# Patient Record
Sex: Male | Born: 1945
Health system: Southern US, Community
[De-identification: ages and names within clinical notes are randomized; demographics above are authoritative.]

## PROBLEM LIST (undated history)

## (undated) DIAGNOSIS — C9000 Multiple myeloma not having achieved remission: Secondary | ICD-10-CM

## (undated) DIAGNOSIS — N183 Chronic kidney disease, stage 3 unspecified: Secondary | ICD-10-CM

## (undated) DIAGNOSIS — N429 Disorder of prostate, unspecified: Secondary | ICD-10-CM

## (undated) DIAGNOSIS — D649 Anemia, unspecified: Secondary | ICD-10-CM

## (undated) DIAGNOSIS — M1990 Unspecified osteoarthritis, unspecified site: Secondary | ICD-10-CM

## (undated) DIAGNOSIS — I10 Essential (primary) hypertension: Secondary | ICD-10-CM

## (undated) HISTORY — DX: Anemia, unspecified: D64.9

## (undated) HISTORY — PX: KNEE SURGERY: SHX244

## (undated) HISTORY — DX: Unspecified osteoarthritis, unspecified site: M19.90

---

## 2000-07-13 ENCOUNTER — Encounter: Admission: RE | Admit: 2000-07-13 | Discharge: 2000-07-13 | Payer: Self-pay | Admitting: Emergency Medicine

## 2000-07-13 ENCOUNTER — Encounter: Payer: Self-pay | Admitting: Emergency Medicine

## 2002-06-03 ENCOUNTER — Encounter: Payer: Self-pay | Admitting: Emergency Medicine

## 2002-06-03 ENCOUNTER — Encounter: Admission: RE | Admit: 2002-06-03 | Discharge: 2002-06-03 | Payer: Self-pay | Admitting: Emergency Medicine

## 2008-05-31 ENCOUNTER — Emergency Department (HOSPITAL_COMMUNITY): Admission: EM | Admit: 2008-05-31 | Discharge: 2008-05-31 | Payer: Self-pay | Admitting: Emergency Medicine

## 2012-02-12 ENCOUNTER — Encounter (HOSPITAL_COMMUNITY): Payer: Self-pay | Admitting: Emergency Medicine

## 2012-02-12 ENCOUNTER — Emergency Department (HOSPITAL_COMMUNITY)
Admission: EM | Admit: 2012-02-12 | Discharge: 2012-02-12 | Disposition: A | Discharge status: Home Health | Payer: 59 | Source: Home / Self Care | Attending: Emergency Medicine | Admitting: Emergency Medicine

## 2012-02-12 DIAGNOSIS — J40 Bronchitis, not specified as acute or chronic: Secondary | ICD-10-CM

## 2012-02-12 DIAGNOSIS — J069 Acute upper respiratory infection, unspecified: Secondary | ICD-10-CM

## 2012-02-12 DIAGNOSIS — J329 Chronic sinusitis, unspecified: Secondary | ICD-10-CM

## 2012-02-12 HISTORY — DX: Essential (primary) hypertension: I10

## 2012-02-12 MED ORDER — BENZONATATE 200 MG PO CAPS: 200.0000 mg | ORAL_CAPSULE | Freq: Three times a day (TID) | ORAL | Status: AC | PRN

## 2012-02-12 MED ORDER — AMOXICILLIN 500 MG PO CAPS: 1000.0000 mg | ORAL_CAPSULE | Freq: Three times a day (TID) | ORAL | Status: AC

## 2012-02-12 MED ORDER — ALBUTEROL SULFATE HFA 108 (90 BASE) MCG/ACT IN AERS: 1.0000 | INHALATION_SPRAY | Freq: Four times a day (QID) | RESPIRATORY_TRACT | Status: DC | PRN

## 2012-02-12 NOTE — Discharge Instructions (Signed)

## 2012-02-12 NOTE — ED Notes (Signed)
Pt having cough for about 2 weeks. States it got better with some nasonex usage, but now is getting worse and is painful. Yellow thin mucus coughing up.

## 2012-02-12 NOTE — ED Provider Notes (Signed)
Chief Complaint  Patient presents with  . Cough    History of Present Illness:   Marcus Hatfield is a 66 year old male who has had a one-week history of nasal congestion particularly in the right side with yellow drainage and headache. He's had an a cough productive of yellow sputum and some aching in his ribs. He occasionally wheezes. He's had a low-grade fever and a sore throat as well.  Review of Systems:  Other than noted above, the patient denies any of the following symptoms. Systemic:  No fever, chills, sweats, fatigue, myalgias, headache, or anorexia. Eye:  No redness, pain or drainage. ENT:  No earache, nasal congestion, rhinorrhea, sinus pressure, or sore throat. Lungs:  No cough, sputum production, wheezing, shortness of breath. Or chest pain. GI:  No nausea, vomiting, abdominal pain or diarrhea. Skin:  No rash or itching.  PMFSH:  Past medical history, family history, social history, meds, and allergies were reviewed.  Physical Exam:   Vital signs:  BP 130/84  Pulse 70  Temp(Src) 99.4 F (37.4 C) (Oral)  Resp 16  SpO2 95% General:  Alert, in no distress. Eye:  No conjunctival injection or drainage. ENT:  TMs and canals were normal, without erythema or inflammation.  Nasal mucosa was congested bilaterally, without drainage.  Mucous membranes were moist.  Pharynx was erythematous, without exudate or drainage.  There were no oral ulcerations or lesions. Neck:  Supple, no adenopathy, tenderness or mass. Lungs:  No respiratory distress.  Lungs were clear to auscultation, without wheezes, rales or rhonchi.  Breath sounds were clear and equal bilaterally. Heart:  Regular rhythm, without gallops, murmers or rubs. Skin:  Clear, warm, and dry, without rash or lesions.  Labs:  No results found for this or any previous visit.   Radiology:  No results found.  Assessment:   Diagnoses that have been ruled out:  None  Diagnoses that are still under consideration:  None  Final diagnoses:    Viral upper respiratory infection  Sinusitis  Bronchitis      Plan:   1.  The following meds were prescribed:   New Prescriptions   ALBUTEROL (PROVENTIL HFA;VENTOLIN HFA) 108 (90 BASE) MCG/ACT INHALER    Inhale 1-2 puffs into the lungs every 6 (six) hours as needed for wheezing.   AMOXICILLIN (AMOXIL) 500 MG CAPSULE    Take 2 capsules (1,000 mg total) by mouth 3 (three) times daily.   BENZONATATE (TESSALON) 200 MG CAPSULE    Take 1 capsule (200 mg total) by mouth 3 (three) times daily as needed for cough.   2.  The patient was instructed in symptomatic care and handouts were given. 3.  The patient was told to return if becoming worse in any way, if no better in 3 or 4 days, and given some red flag symptoms that would indicate earlier return.   Roque Lias, MD 02/12/12 [phone]

## 2012-02-21 NOTE — ED Notes (Signed)
Pt. brought FMLA papers for intermittant FMLA for the next year. I called pt. back and told him we are not a primary care facility and would not be able to fill this out for him. I told him we gave him a 3 day work note for a URI. I asked him if he stayed out any longer than that and he said no, but he wished he had because he was still sick. I explained that if he had an ongoing medical problem that requires intermittant FMLA he should get a PCP that can fill it out for him. Vassie Moselle 02/21/2012

## 2012-04-01 ENCOUNTER — Emergency Department (HOSPITAL_COMMUNITY)
Admission: EM | Admit: 2012-04-01 | Discharge: 2012-04-01 | Disposition: A | Discharge status: Home / Self Care | Payer: 59 | Source: Home / Self Care | Attending: Emergency Medicine | Admitting: Emergency Medicine

## 2012-04-01 ENCOUNTER — Encounter (HOSPITAL_COMMUNITY): Payer: Self-pay

## 2012-04-01 DIAGNOSIS — R05 Cough: Secondary | ICD-10-CM

## 2012-04-01 DIAGNOSIS — R059 Cough, unspecified: Secondary | ICD-10-CM

## 2012-04-01 MED ORDER — GUAIFENESIN-CODEINE 100-10 MG/5ML PO SYRP: 5.0000 mL | ORAL_SOLUTION | Freq: Four times a day (QID) | ORAL | Status: AC | PRN

## 2012-04-01 NOTE — Discharge Instructions (Signed)
Take two puffs from your albuterol inhaler every 4 hours. Take the medication as written. Use a neti pot or the NeilMed sinus rinse as often as you want to to reduce nasal congestion. Follow the directions on the box. Continue the nasal steroid spray and the claritin-d.   . Make sure you drink extra fluids. Return if you get worse, have a fever >100.4, or any other concerns.   Go to www.goodrx.com to look up your medications. This will give you a list of where you can find your prescriptions at the most affordable prices.

## 2012-04-01 NOTE — ED Notes (Signed)
3-4 day hx of sinus pressure.  Took claritin last night and was able to cough and blow nose to obtain mucus.  Feels better after taking claritin. Noticed low grade fevers.

## 2012-04-02 NOTE — ED Provider Notes (Signed)
History     CSN: [id number]  Arrival date & time 04/01/12  1818   First MD Initiated Contact with Patient 04/01/12 1928      Chief Complaint  Patient presents with  . Sinus Problem    3-4 day hx of sinus pressure.  Took claritin last night and was able to cough and blow nose to obtain mucus.  Feels better after taking claritin. Noticed low grade fevers.     (Consider location/radiation/quality/duration/timing/severity/associated sxs/prior treatment) HPI Comments: Pt with nasal congestion, sinus pressure, nonproductive cough for 3-4 days. Reports some postnasal drip. Patient started taking Claritin-D last night, with some improvement in his symptoms. No aggravating factors. Patient reports some difficulty sleeping at night secondary to all the coughing.  No visual changes. No fevers, headache, rhinorrhea,  ST, bodyaches. No dental pain, purulent nasal discharge. No eye itching, redness. No ear pain, wheeze, SOB, abd pain, rash, N/V. No change in appetite.   ROS as noted in HPI. All other ROS negative.     Patient is a 66 y.o. male presenting with sinus complaint. The history is provided by the patient. No language interpreter was used.  Sinus Problem This is a new problem. The current episode started more than 2 days ago. The problem occurs constantly.    Past Medical History  Diagnosis Date  . Hypertension     Past Surgical History  Procedure Date  . Knee surgery     History reviewed. No pertinent family history.  History  Substance Use Topics  . Smoking status: Never Smoker   . Smokeless tobacco: Not on file  . Alcohol Use: No      Review of Systems  Allergies  Review of patient's allergies indicates no known allergies.  Home Medications   Current Outpatient Rx  Name Route Sig Dispense Refill  . ALBUTEROL SULFATE HFA 108 (90 BASE) MCG/ACT IN AERS Inhalation Inhale 1-2 puffs into the lungs every 6 (six) hours as needed for wheezing. 1 Inhaler 0  .  LORATADINE-PSEUDOEPHEDRINE ER 10-240 MG PO TB24 Oral Take 1 tablet by mouth daily.    . MOMETASONE FUROATE 50 MCG/ACT NA SUSP Nasal Place 2 sprays into the nose daily.    . MULTI-VITAMIN/MINERALS PO TABS Oral Take 1 tablet by mouth daily.    Marland Kitchen OLMESARTAN-AMLODIPINE-HCTZ 20-5-12.5 MG PO TABS Oral Take by mouth.    . GUAIFENESIN-CODEINE 100-10 MG/5ML PO SYRP Oral Take 5 mLs by mouth 4 (four) times daily as needed for cough or congestion. 120 mL 0    BP 139/83  Pulse 82  Temp(Src) 98.7 F (37.1 C) (Oral)  Resp 16  SpO2 98%  Physical Exam  Nursing note and vitals reviewed. Constitutional: He is oriented to person, place, and time. He appears well-developed and well-nourished.  HENT:  Head: Normocephalic and atraumatic.  Right Ear: Hearing, tympanic membrane and ear canal normal.  Left Ear: Hearing, tympanic membrane and ear canal normal.  Nose: Mucosal edema and rhinorrhea present.  Mouth/Throat: Uvula is midline and mucous membranes are normal. Posterior oropharyngeal erythema present. No oropharyngeal exudate.       No sinus tenderness  Eyes: Conjunctivae and EOM are normal. Pupils are equal, round, and reactive to light.  Neck: Normal range of motion. Neck supple.  Cardiovascular: Normal rate, regular rhythm and normal heart sounds.   Pulmonary/Chest: Effort normal and breath sounds normal. He exhibits no tenderness.  Abdominal: Soft. Bowel sounds are normal. He exhibits no distension. There is no tenderness.  Musculoskeletal: Normal range  of motion. He exhibits no edema.  Lymphadenopathy:    He has no cervical adenopathy.  Neurological: He is alert and oriented to person, place, and time.  Skin: Skin is warm and dry. No rash noted.  Psychiatric: He has a normal mood and affect. His behavior is normal. Judgment and thought content normal.    ED Course  Procedures (including critical care time)  Labs Reviewed - No data to display No results found.   1. Cough       MDM    No evidence of bacterial sinusitis at this time. Symptoms responded well to Claritin D. think that this may have a significant allergy component. Will have him continue the Claritin-D, restart the Nasonex. Advised saline nasal irrigation. Cough most likely with allergy component. Will have him restart the albuterol that he was prescribed last time. Will also give him some cough syrup. Will follow with Dr. as needed.  Luiz Blare, MD 04/02/12 2220

## 2012-04-13 ENCOUNTER — Encounter (INDEPENDENT_AMBULATORY_CARE_PROVIDER_SITE_OTHER): Payer: Self-pay | Admitting: General Surgery

## 2012-04-16 ENCOUNTER — Encounter (INDEPENDENT_AMBULATORY_CARE_PROVIDER_SITE_OTHER): Payer: Self-pay | Admitting: General Surgery

## 2012-04-16 ENCOUNTER — Ambulatory Visit (INDEPENDENT_AMBULATORY_CARE_PROVIDER_SITE_OTHER): Payer: 59 | Admitting: General Surgery

## 2012-04-16 VITALS — BP 144/86 | Temp 96.8°F | Ht 71.0 in | Wt 198.6 lb

## 2012-04-16 DIAGNOSIS — K645 Perianal venous thrombosis: Secondary | ICD-10-CM | POA: Insufficient documentation

## 2012-04-16 NOTE — Patient Instructions (Signed)
Try using Miralax everyday for constipation Do not use donut May use desitin or aquaphor to protect skin daily

## 2012-04-16 NOTE — Progress Notes (Signed)
Subjective:     Patient ID: Marcus Hatfield, male   DOB: Apr 11, 1946, 66 y.o.   MRN: [id number]  HPI We are asked to see the patient in consultation by Dr. Felipa Eth to evaluate him for a hemorrhoid. The patient is a 66 year old black male who began having pain in his perirectal area last Wednesday. He did notice a swollen area. Most of the soreness is getting better but he continues to have some burning and itching in the area. He denies any bleeding. He does note that he has lots of problems with constipation  Review of Systems  Constitutional: Negative.   HENT: Negative.   Eyes: Negative.   Respiratory: Negative.   Cardiovascular: Negative.   Gastrointestinal: Positive for constipation.  Genitourinary: Negative.   Musculoskeletal: Negative.   Skin: Negative.   Neurological: Negative.   Hematological: Negative.   Psychiatric/Behavioral: Negative.        Objective:   Physical Exam  Constitutional: He is oriented to person, place, and time. He appears well-developed and well-nourished.  HENT:  Head: Normocephalic and atraumatic.  Eyes: Conjunctivae and EOM are normal. Pupils are equal, round, and reactive to light.  Neck: Normal range of motion. Neck supple.  Cardiovascular: Normal rate, regular rhythm and normal heart sounds.   Pulmonary/Chest: Effort normal and breath sounds normal.  Abdominal: Soft. Bowel sounds are normal.  Genitourinary:       The patient has a small soft and external hemorrhoidal skin tag that is fairly broad-based in the right lateral position. This area may have been thrombosed and is gradually resolving based on its appearance. On digital exam he has good rectal tone and no palpable mass.  Musculoskeletal: Normal range of motion.  Neurological: He is alert and oriented to person, place, and time.  Skin: Skin is warm and dry.  Psychiatric: He has a normal mood and affect. His behavior is normal.       Assessment:     I believe the patient probably had a  thrombosed external hemorrhoid that is resolving now. I don't think at this point there is anything surgical to do for it. I have recommended some barrier cream to keep the skin healthy. I have also recommended trying MiraLax to help fix his constipation and hopefully this will keep him from continuing to have hemorrhoid problems. Otherwise he can followup with his medical doctor.    Plan:     No surgical intervention is needed at this point.

## 2012-04-18 ENCOUNTER — Telehealth (INDEPENDENT_AMBULATORY_CARE_PROVIDER_SITE_OTHER): Payer: Self-pay

## 2012-04-18 ENCOUNTER — Telehealth (INDEPENDENT_AMBULATORY_CARE_PROVIDER_SITE_OTHER): Payer: Self-pay | Admitting: General Surgery

## 2012-04-18 NOTE — Telephone Encounter (Signed)
Patient would like to know if he should be using OTC or prescription strength suppositories for relief from itching and burning he continues to experience. Patient seen on 04/16/12 for internal & external hemorrhoids.

## 2012-04-18 NOTE — Telephone Encounter (Signed)
Called pt back and left a message saying that he did not need to take any OTC drugs for that problem.

## 2012-04-19 NOTE — Telephone Encounter (Signed)
I think he should just use desitin or aquaphor

## 2012-05-10 ENCOUNTER — Encounter (HOSPITAL_COMMUNITY): Payer: Self-pay | Admitting: Emergency Medicine

## 2012-05-10 ENCOUNTER — Emergency Department (HOSPITAL_COMMUNITY)
Admission: EM | Admit: 2012-05-10 | Discharge: 2012-05-10 | Disposition: A | Discharge status: Home / Self Care | Payer: 59 | Source: Home / Self Care | Attending: Emergency Medicine | Admitting: Emergency Medicine

## 2012-05-10 DIAGNOSIS — T148 Other injury of unspecified body region: Secondary | ICD-10-CM

## 2012-05-10 DIAGNOSIS — W57XXXA Bitten or stung by nonvenomous insect and other nonvenomous arthropods, initial encounter: Secondary | ICD-10-CM

## 2012-05-10 DIAGNOSIS — T148XXA Other injury of unspecified body region, initial encounter: Secondary | ICD-10-CM

## 2012-05-10 MED ORDER — BACITRACIN ZINC 500 UNIT/GM EX OINT: TOPICAL_OINTMENT | Freq: Two times a day (BID) | CUTANEOUS | Status: AC

## 2012-05-10 NOTE — ED Provider Notes (Signed)
History     CSN: [id number]  Arrival date & time 05/10/12  1905   First MD Initiated Contact with Patient 05/10/12 1908      Chief Complaint  Patient presents with  . Tick Removal    (Consider location/radiation/quality/duration/timing/severity/associated sxs/prior treatment) HPI Comments: Last night his wife found a tick embedded in his right flank. They've wear applying alcohol tried to remove it but they were unsuccessful. Patient describes it as being itchy but he didn't know it was a tick thought it was just a whelp.  The history is provided by the patient.    Past Medical History  Diagnosis Date  . Hypertension   . Arthritis     Past Surgical History  Procedure Date  . Knee surgery     Family History  Problem Relation Age of Onset  . Diabetes Father     History  Substance Use Topics  . Smoking status: Never Smoker   . Smokeless tobacco: Not on file  . Alcohol Use: No      Review of Systems  Eyes: Negative for itching.  Skin: Positive for rash.    Allergies  Penicillins  Home Medications   Current Outpatient Rx  Name Route Sig Dispense Refill  . ALBUTEROL SULFATE HFA 108 (90 BASE) MCG/ACT IN AERS Inhalation Inhale 1-2 puffs into the lungs every 6 (six) hours as needed for wheezing. 1 Inhaler 0  . BACITRACIN ZINC 500 UNIT/GM EX OINT Topical Apply topically 2 (two) times daily. Apply bid x 5 days 120 g 0  . HYDROCORTISONE ACETATE 25 MG RE SUPP      . LORATADINE-PSEUDOEPHEDRINE ER 10-240 MG PO TB24 Oral Take 1 tablet by mouth daily.    . MOMETASONE FUROATE 50 MCG/ACT NA SUSP Nasal Place 2 sprays into the nose daily.    . MULTI-VITAMIN/MINERALS PO TABS Oral Take 1 tablet by mouth daily.    Marland Kitchen OLMESARTAN-AMLODIPINE-HCTZ 20-5-12.5 MG PO TABS Oral Take by mouth.    Marland Kitchen PROCTOSOL HC 2.5 % RE CREA        BP 131/90  Pulse 78  Temp(Src) 98.5 F (36.9 C) (Oral)  Resp 18  SpO2 99%  Physical Exam  Constitutional: He appears well-developed and  well-nourished.  Abdominal: He exhibits no distension.  Skin: Rash noted.       ED Course  Procedures (including critical care time)  Labs Reviewed - No data to display No results found.   1. Tick bite       MDM  Tick removed from right flank. Tick was not engorged with any blood. No residual mouth apparatus were left in site. Patient denies any systemic symptoms such as fevers, body aches nausea vomiting abdominal pain or any new rashes. No headaches the patient symptoms and weren't further evaluation and perhaps treatment and discuss is low risk of having require a tick borne illness. Patient was a treatment plan and understands symptoms or further evaluation.        Jimmie Molly, MD 05/10/12 [phone]

## 2012-05-10 NOTE — Discharge Instructions (Signed)
We discuss several symptoms that would warrant followup and recheck. Have remove this tick completely blood in it suspect her risk of acquiring a tick related illness this very low. we discussed what symptoms will be concerned that you will need to seek medical attention immediately. read discharge instructions for further information

## 2012-05-10 NOTE — ED Notes (Signed)
Pt has a tick imbedded in his right flank that his wife found and he needs it removed. Tick was first noted 2 days ago. No pain reported.

## 2012-05-24 ENCOUNTER — Telehealth (HOSPITAL_COMMUNITY): Payer: Self-pay | Admitting: *Deleted

## 2012-05-24 NOTE — ED Notes (Signed)
Pt. called and said he has 3 hard bumps that have come up around the site where the tick was removed. Denies redness or pain. It does itch off and on. No fever or headaches. Discussed with Dr. Ladon Applebaum, check for other bumps at the same level around his back, if no other bumps then try Hydrocortisone 2-3 times per day for 2 days.  If no improvement, come in for a recheck. Pt. given these instructions and voiced understanding. Vassie Moselle 05/24/2012

## 2013-06-30 ENCOUNTER — Encounter (HOSPITAL_COMMUNITY): Payer: Self-pay | Admitting: *Deleted

## 2013-06-30 ENCOUNTER — Emergency Department (HOSPITAL_COMMUNITY)
Admission: EM | Admit: 2013-06-30 | Discharge: 2013-06-30 | Disposition: A | Discharge status: Home / Self Care | Payer: Medicare Other | Source: Home / Self Care | Attending: Emergency Medicine | Admitting: Emergency Medicine

## 2013-06-30 DIAGNOSIS — R319 Hematuria, unspecified: Secondary | ICD-10-CM

## 2013-06-30 HISTORY — DX: Disorder of prostate, unspecified: N42.9

## 2013-06-30 LAB — POCT URINALYSIS DIP (DEVICE)
Bilirubin Urine: NEGATIVE
Glucose, UA: NEGATIVE mg/dL
Ketones, ur: NEGATIVE mg/dL
Leukocytes, UA: NEGATIVE
Nitrite: NEGATIVE
Protein, ur: 100 mg/dL — AB
Specific Gravity, Urine: 1.015 (ref 1.005–1.030)
Urobilinogen, UA: 0.2 mg/dL (ref 0.0–1.0)
pH: 7 (ref 5.0–8.0)

## 2013-06-30 NOTE — ED Provider Notes (Signed)
And a  History    CSN: [id number] Arrival date & time 06/30/13  0905  First MD Initiated Contact with Patient 06/30/13 0915     Chief Complaint  Patient presents with  . Hematuria   (Consider location/radiation/quality/duration/timing/severity/associated sxs/prior Treatment) HPI Comments: Patient presents urgent care describing that since last night as in the middle of the night he noticed significant amount of blood in his urine. First a 3 AM and early this morning. He has not had any pain associated with it such as flank or abdominal pain or suprapubic pressure or pain.  Patient also denies any nausea vomiting or fevers. Patient describes that he has been seen by the urologist couple years ago where he had prostate biopsies that have been there to be negative for cancer. He is currently under the impression that he has an enlarged prostate only.   Patient also relates that for about a month or 2 he has been having some upper back discomfort that he notices only when he bends over or flexes his back as he tries to perform stretching exercises every day.   Patient denies any constitutional symptoms such as fevers, generalized malaise or unintentional weight loss. Denies any rectal pain or bleeding.   He also describes it as he gave [place] a urine sample this morning blood seems to have subsided some as it looks a bit clearer than what it did on 2 previous occasions he urinated in the last 8 hours.  Patient is a 67 y.o. male presenting with hematuria. The history is provided by the patient.  Hematuria This is a new problem. The current episode started yesterday. The problem occurs constantly. The problem has been gradually worsening. Pertinent negatives include no chest pain, no abdominal pain, no headaches and no shortness of breath. Nothing aggravates the symptoms. Nothing relieves the symptoms. He has tried nothing for the symptoms. The treatment provided no relief.   Past Medical History   Diagnosis Date  . Hypertension   . Arthritis   . Prostate disorder    Past Surgical History  Procedure Laterality Date  . Knee surgery     Family History  Problem Relation Age of Onset  . Diabetes Father    History  Substance Use Topics  . Smoking status: Never Smoker   . Smokeless tobacco: Not on file  . Alcohol Use: No    Review of Systems  Constitutional: Negative for fever, chills, diaphoresis, activity change, appetite change, fatigue and unexpected weight change.  Respiratory: Negative for shortness of breath.   Cardiovascular: Negative for chest pain.  Gastrointestinal: Negative for abdominal pain.  Genitourinary: Positive for hematuria. Negative for dysuria, urgency, frequency, flank pain, decreased urine volume, penile swelling, scrotal swelling, enuresis, genital sores and testicular pain.  Skin: Negative for color change, pallor and rash.  Neurological: Negative for headaches.    Allergies  Penicillins  Home Medications   Current Outpatient Rx  Name  Route  Sig  Dispense  Refill  . EXPIRED: albuterol (PROVENTIL HFA;VENTOLIN HFA) 108 (90 BASE) MCG/ACT inhaler   Inhalation   Inhale 1-2 puffs into the lungs every 6 (six) hours as needed for wheezing.   1 Inhaler   0   . hydrocortisone (ANUSOL-HC) 25 MG suppository               . loratadine-pseudoephedrine (CLARITIN-D 24-HOUR) 10-240 MG per 24 hr tablet   Oral   Take 1 tablet by mouth daily.         Marland Kitchen  mometasone (NASONEX) 50 MCG/ACT nasal spray   Nasal   Place 2 sprays into the nose daily.         . Multiple Vitamins-Minerals (MULTIVITAMIN WITH MINERALS) tablet   Oral   Take 1 tablet by mouth daily.         . Olmesartan-Amlodipine-HCTZ (TRIBENZOR) 20-5-12.5 MG TABS   Oral   Take by mouth.         Marland Kitchen PROCTOSOL HC 2.5 % rectal cream                BP 138/84  Pulse 57  Temp(Src) 98.2 F (36.8 C) (Oral)  Resp 17  SpO2 98% Physical Exam  Vitals reviewed. Constitutional: He  appears well-developed and well-nourished.  Abdominal: Soft. Normal appearance. He exhibits no distension and no mass. There is no hepatosplenomegaly, splenomegaly or hepatomegaly. There is no tenderness. There is no rigidity, no rebound, no guarding, no CVA tenderness, no tenderness at McBurney's point and negative Murphy's sign. No hernia. Hernia confirmed negative in the ventral area, confirmed negative in the right inguinal area and confirmed negative in the left inguinal area.  Genitourinary: Testes normal and penis normal.  Neurological: He is alert.  Skin: No erythema.    ED Course  Procedures (including critical care time) Labs Reviewed  POCT URINALYSIS DIP (DEVICE) - Abnormal; Notable for the following:    Hgb urine dipstick LARGE (*)    Protein, ur 100 (*)    All other components within normal limits  URINE CULTURE   No results found. 1. Hematuria     MDM  Hematuria (Painless)  < 24 hrs-   Have discussed with patient the possibility, the disc hematuria might be related to either kidney or bladder problems including  Oncological- disorder, such as bladder cancer or renal carcinoma. Differential diagnosis also includes, ureterolithiasis.  Have advised patient to followup with his primary care Dr. next week to repeat a urinalysis. Have ordered a renal/bladder ultrasound, to be done ambulatory within the next week or so. Patient has been instructed that he will be called by the hospital to schedule it. Have also instructed patient to discuss this with his primary care Dr. Have discussed with patient what symptoms should warrant immediate medical attention in the emergency department such as abdominal pain, nausea vomiting or fevers. He has also been instructed that he will be contacted if abnormal urine culture results  Jimmie Molly, MD 06/30/13 1022

## 2013-06-30 NOTE — ED Notes (Addendum)
Pt  Says  He  Has  A  Large  Prostate     He  Says  He  Noticed  Blood  In urine  Last  Night   He  Reports  His  Stream  Seems  To  Cut off   During  Urination  He is  Sitting  Comfortably on  Exam table  In no  Distress  Denys  Any pain Pt reports  Pain   Was  5  During  The  Night  Earlier    He  Reports    Kidney  Stone in the  Distant  Past when he  Was  younger

## 2013-07-02 LAB — URINE CULTURE
Colony Count: 30000
Special Requests: NORMAL

## 2013-07-09 ENCOUNTER — Ambulatory Visit (HOSPITAL_COMMUNITY): Payer: Medicare Other

## 2013-07-11 ENCOUNTER — Ambulatory Visit (HOSPITAL_COMMUNITY)
Admission: RE | Admit: 2013-07-11 | Discharge: 2013-07-11 | Disposition: A | Discharge status: Home / Self Care | Payer: Medicare Other | Source: Ambulatory Visit | Attending: Emergency Medicine | Admitting: Emergency Medicine

## 2013-07-11 DIAGNOSIS — R319 Hematuria, unspecified: Secondary | ICD-10-CM | POA: Insufficient documentation

## 2013-07-11 DIAGNOSIS — Z87442 Personal history of urinary calculi: Secondary | ICD-10-CM | POA: Insufficient documentation

## 2013-07-11 DIAGNOSIS — N400 Enlarged prostate without lower urinary tract symptoms: Secondary | ICD-10-CM | POA: Insufficient documentation

## 2013-08-02 ENCOUNTER — Other Ambulatory Visit: Payer: Self-pay | Admitting: Internal Medicine

## 2013-08-02 DIAGNOSIS — N2889 Other specified disorders of kidney and ureter: Secondary | ICD-10-CM

## 2013-08-06 ENCOUNTER — Ambulatory Visit
Admission: RE | Admit: 2013-08-06 | Discharge: 2013-08-06 | Disposition: A | Discharge status: Home / Self Care | Payer: Medicare Other | Source: Ambulatory Visit | Attending: Internal Medicine | Admitting: Internal Medicine

## 2013-08-06 DIAGNOSIS — N2889 Other specified disorders of kidney and ureter: Secondary | ICD-10-CM

## 2013-08-06 MED ORDER — GADOBENATE DIMEGLUMINE 529 MG/ML IV SOLN
19.0000 mL | Freq: Once | INTRAVENOUS | Status: AC | PRN
  Administered 2013-08-06: 19 mL via INTRAVENOUS

## 2014-12-19 DIAGNOSIS — C9000 Multiple myeloma not having achieved remission: Secondary | ICD-10-CM

## 2014-12-19 HISTORY — DX: Multiple myeloma not having achieved remission: C90.00

## 2015-03-20 HISTORY — PX: NEPHRECTOMY: SHX65

## 2015-08-12 ENCOUNTER — Other Ambulatory Visit: Payer: Self-pay | Admitting: Internal Medicine

## 2015-08-12 DIAGNOSIS — N2889 Other specified disorders of kidney and ureter: Secondary | ICD-10-CM

## 2015-08-14 ENCOUNTER — Ambulatory Visit
Admission: RE | Admit: 2015-08-14 | Discharge: 2015-08-14 | Disposition: A | Discharge status: Home / Self Care | Payer: Medicare HMO | Source: Ambulatory Visit | Attending: Internal Medicine | Admitting: Internal Medicine

## 2015-08-14 DIAGNOSIS — N2889 Other specified disorders of kidney and ureter: Secondary | ICD-10-CM

## 2016-02-05 ENCOUNTER — Telehealth: Payer: Self-pay | Admitting: Internal Medicine

## 2016-02-05 NOTE — Telephone Encounter (Signed)
pts daughter came in to see if you can take on him and his wife Marcus Hatfield MRN [id number] as new patients. Your patients Denyse Amass and Carlyn Reichert had recommended you.  Please advise

## 2016-02-08 NOTE — Telephone Encounter (Signed)
Unfortunately, I'm not able to accept any more new patients at this time.  I'm sorry! Thank you!  

## 2016-02-09 NOTE — Telephone Encounter (Signed)
Left msg on Gloria's vm

## 2016-02-10 ENCOUNTER — Emergency Department (HOSPITAL_COMMUNITY): Payer: Commercial Managed Care - HMO

## 2016-02-10 ENCOUNTER — Emergency Department (HOSPITAL_COMMUNITY)
Admission: EM | Admit: 2016-02-10 | Discharge: 2016-02-11 | Disposition: A | Discharge status: Home / Self Care | Payer: Commercial Managed Care - HMO | Attending: Emergency Medicine | Admitting: Emergency Medicine

## 2016-02-10 ENCOUNTER — Encounter (HOSPITAL_COMMUNITY): Payer: Self-pay

## 2016-02-10 DIAGNOSIS — D649 Anemia, unspecified: Secondary | ICD-10-CM | POA: Diagnosis not present

## 2016-02-10 DIAGNOSIS — M1990 Unspecified osteoarthritis, unspecified site: Secondary | ICD-10-CM | POA: Insufficient documentation

## 2016-02-10 DIAGNOSIS — Z79899 Other long term (current) drug therapy: Secondary | ICD-10-CM | POA: Diagnosis not present

## 2016-02-10 DIAGNOSIS — Z87438 Personal history of other diseases of male genital organs: Secondary | ICD-10-CM | POA: Insufficient documentation

## 2016-02-10 DIAGNOSIS — Z880 Allergy status to penicillin: Secondary | ICD-10-CM | POA: Insufficient documentation

## 2016-02-10 DIAGNOSIS — Z7952 Long term (current) use of systemic steroids: Secondary | ICD-10-CM | POA: Diagnosis not present

## 2016-02-10 DIAGNOSIS — N9961 Intraoperative hemorrhage and hematoma of a genitourinary system organ or structure complicating a genitourinary system procedure: Secondary | ICD-10-CM | POA: Diagnosis not present

## 2016-02-10 DIAGNOSIS — IMO0001 Reserved for inherently not codable concepts without codable children: Secondary | ICD-10-CM

## 2016-02-10 DIAGNOSIS — Z905 Acquired absence of kidney: Secondary | ICD-10-CM | POA: Insufficient documentation

## 2016-02-10 DIAGNOSIS — I10 Essential (primary) hypertension: Secondary | ICD-10-CM | POA: Diagnosis not present

## 2016-02-10 DIAGNOSIS — R109 Unspecified abdominal pain: Secondary | ICD-10-CM

## 2016-02-10 DIAGNOSIS — S3692XA Contusion of unspecified intra-abdominal organ, initial encounter: Secondary | ICD-10-CM

## 2016-02-10 DIAGNOSIS — R05 Cough: Secondary | ICD-10-CM | POA: Diagnosis not present

## 2016-02-10 DIAGNOSIS — R0602 Shortness of breath: Secondary | ICD-10-CM | POA: Diagnosis present

## 2016-02-10 LAB — ABO/RH: ABO/RH(D): B POS

## 2016-02-10 LAB — CBC WITH DIFFERENTIAL/PLATELET
Basophils Absolute: 0 10*3/uL (ref 0.0–0.1)
Basophils Relative: 0 %
Eosinophils Absolute: 0.5 10*3/uL (ref 0.0–0.7)
Eosinophils Relative: 4 %
HCT: 17.3 % — ABNORMAL LOW (ref 39.0–52.0)
Hemoglobin: 5.7 g/dL — CL (ref 13.0–17.0)
Lymphocytes Relative: 15 %
Lymphs Abs: 1.9 10*3/uL (ref 0.7–4.0)
MCH: 28.6 pg (ref 26.0–34.0)
MCHC: 32.9 g/dL (ref 30.0–36.0)
MCV: 86.9 fL (ref 78.0–100.0)
Monocytes Absolute: 1.5 10*3/uL — ABNORMAL HIGH (ref 0.1–1.0)
Monocytes Relative: 12 %
Neutro Abs: 8.8 10*3/uL — ABNORMAL HIGH (ref 1.7–7.7)
Neutrophils Relative %: 69 %
Platelets: 457 10*3/uL — ABNORMAL HIGH (ref 150–400)
RBC: 1.99 MIL/uL — ABNORMAL LOW (ref 4.22–5.81)
RDW: 14.8 % (ref 11.5–15.5)
WBC: 12.7 10*3/uL — ABNORMAL HIGH (ref 4.0–10.5)

## 2016-02-10 LAB — COMPREHENSIVE METABOLIC PANEL
ALT: 55 U/L (ref 17–63)
AST: 35 U/L (ref 15–41)
Albumin: 2.8 g/dL — ABNORMAL LOW (ref 3.5–5.0)
Alkaline Phosphatase: 92 U/L (ref 38–126)
Anion gap: 10 (ref 5–15)
BUN: 30 mg/dL — ABNORMAL HIGH (ref 6–20)
CO2: 23 mmol/L (ref 22–32)
Calcium: 8.4 mg/dL — ABNORMAL LOW (ref 8.9–10.3)
Chloride: 101 mmol/L (ref 101–111)
Creatinine, Ser: 2.25 mg/dL — ABNORMAL HIGH (ref 0.61–1.24)
GFR calc Af Amer: 32 mL/min — ABNORMAL LOW (ref >60)
GFR calc non Af Amer: 28 mL/min — ABNORMAL LOW (ref >60)
Glucose, Bld: 111 mg/dL — ABNORMAL HIGH (ref 65–99)
Potassium: 3.2 mmol/L — ABNORMAL LOW (ref 3.5–5.1)
Sodium: 134 mmol/L — ABNORMAL LOW (ref 135–145)
Total Bilirubin: 1.3 mg/dL — ABNORMAL HIGH (ref 0.3–1.2)
Total Protein: 7.4 g/dL (ref 6.5–8.1)

## 2016-02-10 LAB — PREPARE RBC (CROSSMATCH)

## 2016-02-10 LAB — BRAIN NATRIURETIC PEPTIDE: B Natriuretic Peptide: 31.1 pg/mL (ref 0.0–100.0)

## 2016-02-10 LAB — I-STAT TROPONIN, ED: Troponin i, poc: 0 ng/mL (ref 0.00–0.08)

## 2016-02-10 MED ORDER — SODIUM CHLORIDE 0.9 % IV BOLUS (SEPSIS)
1000.0000 mL | Freq: Once | INTRAVENOUS | Status: AC
  Administered 2016-02-10: 1000 mL via INTRAVENOUS

## 2016-02-10 MED ORDER — SODIUM CHLORIDE 0.9 % IV SOLN
10.0000 mL/h | Freq: Once | INTRAVENOUS | Status: AC
  Administered 2016-02-11: 10 mL/h via INTRAVENOUS

## 2016-02-10 NOTE — ED Notes (Signed)
MD at bedside. 

## 2016-02-10 NOTE — ED Provider Notes (Signed)
CSN: [id number]     Arrival date & time 02/10/16  1746 History   First MD Initiated Contact with Patient 02/10/16 1916     Chief Complaint  Patient presents with  . Shortness of Breath     (Consider location/radiation/quality/duration/timing/severity/associated sxs/prior Treatment) HPI Comments: Patient is a 70 year old male with past medical history of hypertension, arthritis, and recent nephrectomy for a right renal mass. This was performed at [facility] approximately 7 days ago. He reports developing a cough in the hospital which has worsened since returning home. He is having discomfort in his right chest and also reports swelling in both legs. He denies any calf pain. He denies any fevers or chills.  Patient is a 70 y.o. male presenting with shortness of breath. The history is provided by the patient.  Shortness of Breath Severity:  Moderate Onset quality:  Gradual Duration:  5 days Timing:  Constant Progression:  Worsening Chronicity:  New Relieved by:  Nothing Worsened by:  Nothing tried Ineffective treatments:  None tried Associated symptoms: cough   Associated symptoms: no fever and no sputum production     Past Medical History  Diagnosis Date  . Hypertension   . Arthritis   . Prostate disorder    Past Surgical History  Procedure Laterality Date  . Knee surgery    . Nephrectomy     Family History  Problem Relation Age of Onset  . Diabetes Father    Social History  Substance Use Topics  . Smoking status: Never Smoker   . Smokeless tobacco: None  . Alcohol Use: No    Review of Systems  Constitutional: Negative for fever.  Respiratory: Positive for cough and shortness of breath. Negative for sputum production.   All other systems reviewed and are negative.     Allergies  Penicillins  Home Medications   Prior to Admission medications   Medication Sig Start Date End Date Taking? Authorizing Provider  amLODipine (NORVASC) 10 MG tablet Take 10 mg by  mouth daily.  12/28/15  Yes Historical Provider, MD  hydrochlorothiazide (HYDRODIURIL) 25 MG tablet Take 25 mg by mouth daily.  12/28/15  Yes Historical Provider, MD  irbesartan (AVAPRO) 300 MG tablet Take 300 mg by mouth daily.  12/28/15  Yes Historical Provider, MD  mometasone (NASONEX) 50 MCG/ACT nasal spray Place 2 sprays into the nose daily.   Yes Historical Provider, MD  Multiple Vitamins-Minerals (MULTIVITAMIN WITH MINERALS) tablet Take 1 tablet by mouth daily.   Yes Historical Provider, MD  pseudoephedrine-guaifenesin (MUCINEX D) 60-600 MG 12 hr tablet Take 1 tablet by mouth every 12 (twelve) hours as needed for congestion.    Yes Historical Provider, MD  traMADol (ULTRAM) 50 MG tablet Take 50 mg by mouth every 6 (six) hours as needed for moderate pain or severe pain.  02/04/16  Yes Historical Provider, MD  albuterol (PROVENTIL HFA;VENTOLIN HFA) 108 (90 BASE) MCG/ACT inhaler Inhale 1-2 puffs into the lungs every 6 (six) hours as needed for wheezing. 02/12/12 02/11/13  Harden Mo, MD  ibuprofen (ADVIL,MOTRIN) 200 MG tablet Take 400 mg by mouth every 6 (six) hours as needed for moderate pain.     Historical Provider, MD   BP 135/78 mmHg  Pulse 96  Temp(Src) 98.5 F (36.9 C) (Oral)  Resp 20  SpO2 100% Physical Exam  Constitutional: He is oriented to person, place, and time. He appears well-developed and well-nourished. No distress.  HENT:  Head: Normocephalic and atraumatic.  Neck: Normal range of motion. Neck supple.  Cardiovascular: Normal rate, regular rhythm and normal heart sounds.   No murmur heard. Pulmonary/Chest: Effort normal and breath sounds normal. No respiratory distress. He has no wheezes. He has no rales.  Abdominal: Soft. Bowel sounds are normal. He exhibits no distension. There is tenderness. There is no guarding.  There is mild tenderness to the right upper quadrant and right flank in the area of his previous surgery.  Musculoskeletal: Normal range of motion. He exhibits  no edema.  There is 1-2+ pitting edema of both lower extremities. There is no calf tenderness or swelling. Homans sign is absent bilaterally.  Neurological: He is alert and oriented to person, place, and time.  Skin: Skin is warm and dry. He is not diaphoretic.  Nursing note and vitals reviewed.   ED Course  Procedures (including critical care time) Labs Review Labs Reviewed  COMPREHENSIVE METABOLIC PANEL  CBC WITH DIFFERENTIAL/PLATELET  BRAIN NATRIURETIC PEPTIDE  I-STAT [place], ED    Imaging Review Dg Chest 2 View  02/10/2016  CLINICAL DATA:  Dry cough since last week. EXAM: CHEST  2 VIEW COMPARISON:  None. FINDINGS: Moderate volume of free air in the abdomen under the right and left hemidiaphragm and centrally. Heart size is normal with mild tortuosity of the aorta. There is a small right pleural effusion. Opacity abutting the right hemidiaphragm, which is elevated. Minimal subsegmental atelectasis in the left midlung. No pneumothorax. No acute osseous abnormalities are seen. IMPRESSION: 1. Moderate volume of free intra-abdominal air. Recommend correlation for any history of recent abdominal surgery. Findings otherwise concerning for perforated viscus. 2. Small right pleural effusion. Opacity in the right midlung adjacent elevated hemidiaphragm, may be atelectasis. Pneumonia could have a similar appearance. Critical Value/emergent results were called by telephone at the time of interpretation on 02/10/2016 at 6:26 pm to Dr. Letitia Libra, who verbally acknowledged these results. He has not yet seen the patient Electronically Signed   By: Jeb Levering M.D.   On: 02/10/2016 18:30   I have personally reviewed and evaluated these images and lab results as part of my medical decision-making.   EKG Interpretation   Date/Time:  Wednesday February 10 2016 17:59:47 EST Ventricular Rate:  91 PR Interval:  158 QRS Duration: 142 QT Interval:  397 QTC Calculation: 488 R Axis:   -22 Text  Interpretation:  Sinus rhythm Right bundle branch block No prior ecg  for comparison Confirmed by Gabryella Murfin  MD, Jeiden Daughtridge ([id number]) on 02/10/2016  11:33:14 PM      MDM   Final diagnoses:  None    This patient is a 70 year old male with history of renal cell carcinoma recently treated at [facility] with a robotic-assisted right nephrectomy. He presents today with complaints of shortness of breath. On exam he is awake, alert, oriented and nontoxic appearing. He does have mild tenderness to the right upper quadrant and right flank along with a hematoma in this area. His workup today reveals free air within the abdomen, a hemoglobin of 5.7, and CT scan which reveals a 7 cm hematoma in the nephrectomy bed. As he appears to be symptomatic with this hemoglobin, he will be transfused 2 units of red blood cells.   I've discussed these findings with Dr. Ria Clock from urology at [facility]. He is in agreement with the workup and see to it that the patient has follow-up in the urology clinic in the next 1-2 days. According to the CT scan results and what I learned from talking with Dr. Ria Clock, I strongly suspect that the  free air is post surgical and highly doubt any bowel perforation. There is also evidence for a right hemothorax, the etiology of which I am uncertain but likely related to the hematoma in the nephrectomy bed.  This was somewhat of a lengthy workup involving multiple phone calls and exams. The patient and family on several occasions expressed the desire to go home, however I urged him otherwise due to the possible implications of free air in the abdomen. I had multiple conversations with the patient and family. At this point he will receive a blood transfusion, then go home afterwards and follow-up in the urology clinic at [facility] in the next 1-2 days has to be arranged.    Veryl Speak, MD 02/10/16 [phone]

## 2016-02-10 NOTE — ED Notes (Signed)
MD at Bedside.

## 2016-02-10 NOTE — ED Notes (Signed)
Pt and family have expressed that they want to leave AMA. MD made aware.

## 2016-02-10 NOTE — ED Notes (Signed)
Pt transported to CT ?

## 2016-02-10 NOTE — ED Notes (Signed)
Per EMS, pt from MD office. Pt c/o shortness of breath.  Cough.  Dyspnea in supine position.  Productive.  Onset x 1 week after nephrectomy.  Per EMS, MD also concerned for possible EKG changes.  No chest pain.  Vitals:  130/88,hr 95, resp 16, 95% ra.

## 2016-02-11 NOTE — Discharge Instructions (Signed)
Follow-up with your surgeon in the next 1-2 days. They are aware of your situation and should call to arrange this appointment.  Return to the emergency department if your symptoms significantly worsen or change.   Blood Transfusion  A blood transfusion is a procedure in which you receive donated blood through an IV tube. You may need a blood transfusion because of illness, surgery, or injury. The blood may come from a donor, or it may be your own blood that you donated previously. The blood given in a transfusion is made up of different types of cells. You may receive:  Red blood cells. These carry oxygen and replace lost blood.  Platelets. These control bleeding.  Plasma. Thishelps blood to clot. If you have hemophilia or another clotting disorder, you may also receive other types of blood products. LET [facility] CARE PROVIDER KNOW ABOUT:  Any allergies you have.  All medicines you are taking, including vitamins, herbs, eye drops, creams, and over-the-counter medicines.  Previous problems you or members of your family have had with the use of anesthetics.  Any blood disorders you have.  Previous surgeries you have had.  Any medical conditions you may have.  Any previous reactions you have had during a blood transfusion.  RISKS AND COMPLICATIONS Generally, this is a safe procedure. However, problems may occur, including:  Having an allergic reaction to something in the donated blood.  Fever. This may be a reaction to the white blood cells in the transfused blood.  Iron overload. This can happen from having many transfusions.  Transfusion-related acute lung injury (TRALI). This is a rare reaction that causes lung damage. The cause is not known.TRALI can occur within hours of a transfusion or several days later.  Sudden (acute) or delayed hemolytic reactions. This happens if your blood does not match the cells in your transfusion. Your body's defense system (immune  system) may try to attack the new cells. This complication is rare.  Infection. This is rare. BEFORE THE PROCEDURE  You may have a blood test to determine your blood type. This is necessary to know what kind of blood your body will accept.  If you are going to have a planned surgery, you may donate your own blood. This may be done in case you need to have a transfusion.  If you have had an allergic reaction to a transfusion in the past, you may be given medicine to help prevent a reaction. Take this medicine only as directed by your health care provider.  You will have your temperature, blood pressure, and pulse monitored before the transfusion. PROCEDURE   An IV will be started in your hand or arm.  The bag of donated blood will be attached to your IV tube and given into your vein.  Your temperature, blood pressure, and pulse will be monitored regularly during the transfusion. This monitoring is done to detect early signs of a transfusion reaction.  If you have any signs or symptoms of a reaction, your transfusion will be stopped and you may be given medicine.  When the transfusion is over, your IV will be removed.  Pressure may be applied to the IV site for a few minutes.  A bandage (dressing) will be applied. The procedure may vary among health care providers and hospitals. AFTER THE PROCEDURE  Your blood pressure, temperature, and pulse will be monitored regularly.   This information is not intended to replace advice given to you by your health care provider. Make sure  you discuss any questions you have with your health care provider.   Document Released: 12/02/2000 Document Revised: 12/26/2014 Document Reviewed: 10/15/2014 Elsevier Interactive Patient Education [employer].

## 2016-02-11 NOTE — ED Notes (Signed)
Blood consent obtained at [place] on 02/11/2016

## 2016-02-12 LAB — TYPE AND SCREEN
ABO/RH(D): B POS
Antibody Screen: NEGATIVE
Unit division: 0
Unit division: 0

## 2016-12-20 DIAGNOSIS — C9000 Multiple myeloma not having achieved remission: Secondary | ICD-10-CM | POA: Diagnosis not present

## 2016-12-20 DIAGNOSIS — Z5111 Encounter for antineoplastic chemotherapy: Secondary | ICD-10-CM | POA: Diagnosis not present

## 2016-12-21 DIAGNOSIS — Z5111 Encounter for antineoplastic chemotherapy: Secondary | ICD-10-CM | POA: Diagnosis not present

## 2016-12-21 DIAGNOSIS — C9000 Multiple myeloma not having achieved remission: Secondary | ICD-10-CM | POA: Diagnosis not present

## 2016-12-26 DIAGNOSIS — Z5112 Encounter for antineoplastic immunotherapy: Secondary | ICD-10-CM | POA: Diagnosis not present

## 2016-12-26 DIAGNOSIS — C9000 Multiple myeloma not having achieved remission: Secondary | ICD-10-CM | POA: Diagnosis not present

## 2016-12-27 DIAGNOSIS — Z5111 Encounter for antineoplastic chemotherapy: Secondary | ICD-10-CM | POA: Diagnosis not present

## 2016-12-27 DIAGNOSIS — Z79899 Other long term (current) drug therapy: Secondary | ICD-10-CM | POA: Diagnosis not present

## 2016-12-27 DIAGNOSIS — C9000 Multiple myeloma not having achieved remission: Secondary | ICD-10-CM | POA: Diagnosis not present

## 2017-01-06 DIAGNOSIS — C9000 Multiple myeloma not having achieved remission: Secondary | ICD-10-CM | POA: Diagnosis not present

## 2017-01-06 DIAGNOSIS — C642 Malignant neoplasm of left kidney, except renal pelvis: Secondary | ICD-10-CM | POA: Diagnosis not present

## 2017-01-06 DIAGNOSIS — R972 Elevated prostate specific antigen [PSA]: Secondary | ICD-10-CM | POA: Diagnosis not present

## 2017-01-09 DIAGNOSIS — Z5111 Encounter for antineoplastic chemotherapy: Secondary | ICD-10-CM | POA: Diagnosis not present

## 2017-01-09 DIAGNOSIS — C9000 Multiple myeloma not having achieved remission: Secondary | ICD-10-CM | POA: Diagnosis not present

## 2017-01-10 DIAGNOSIS — R69 Illness, unspecified: Secondary | ICD-10-CM | POA: Diagnosis not present

## 2017-01-10 DIAGNOSIS — Z5111 Encounter for antineoplastic chemotherapy: Secondary | ICD-10-CM | POA: Diagnosis not present

## 2017-01-10 DIAGNOSIS — C9000 Multiple myeloma not having achieved remission: Secondary | ICD-10-CM | POA: Diagnosis not present

## 2017-01-13 DIAGNOSIS — R972 Elevated prostate specific antigen [PSA]: Secondary | ICD-10-CM | POA: Diagnosis not present

## 2017-01-13 DIAGNOSIS — C642 Malignant neoplasm of left kidney, except renal pelvis: Secondary | ICD-10-CM | POA: Diagnosis not present

## 2017-01-13 DIAGNOSIS — C9000 Multiple myeloma not having achieved remission: Secondary | ICD-10-CM | POA: Diagnosis not present

## 2017-01-16 DIAGNOSIS — Z5111 Encounter for antineoplastic chemotherapy: Secondary | ICD-10-CM | POA: Diagnosis not present

## 2017-01-16 DIAGNOSIS — C9000 Multiple myeloma not having achieved remission: Secondary | ICD-10-CM | POA: Diagnosis not present

## 2017-01-17 DIAGNOSIS — C9000 Multiple myeloma not having achieved remission: Secondary | ICD-10-CM | POA: Diagnosis not present

## 2017-01-17 DIAGNOSIS — Z5112 Encounter for antineoplastic immunotherapy: Secondary | ICD-10-CM | POA: Diagnosis not present

## 2017-01-23 DIAGNOSIS — Z5111 Encounter for antineoplastic chemotherapy: Secondary | ICD-10-CM | POA: Diagnosis not present

## 2017-01-23 DIAGNOSIS — Z79899 Other long term (current) drug therapy: Secondary | ICD-10-CM | POA: Diagnosis not present

## 2017-01-23 DIAGNOSIS — C9000 Multiple myeloma not having achieved remission: Secondary | ICD-10-CM | POA: Diagnosis not present

## 2017-01-24 DIAGNOSIS — D72829 Elevated white blood cell count, unspecified: Secondary | ICD-10-CM | POA: Diagnosis not present

## 2017-01-24 DIAGNOSIS — Z79899 Other long term (current) drug therapy: Secondary | ICD-10-CM | POA: Diagnosis not present

## 2017-01-24 DIAGNOSIS — D649 Anemia, unspecified: Secondary | ICD-10-CM | POA: Diagnosis not present

## 2017-01-24 DIAGNOSIS — Z1371 Encounter for nonprocreative screening for genetic disease carrier status: Secondary | ICD-10-CM | POA: Diagnosis not present

## 2017-01-24 DIAGNOSIS — C9000 Multiple myeloma not having achieved remission: Secondary | ICD-10-CM | POA: Diagnosis not present

## 2017-01-24 DIAGNOSIS — Z5111 Encounter for antineoplastic chemotherapy: Secondary | ICD-10-CM | POA: Diagnosis not present

## 2017-02-01 DIAGNOSIS — Z880 Allergy status to penicillin: Secondary | ICD-10-CM | POA: Diagnosis not present

## 2017-02-01 DIAGNOSIS — C9000 Multiple myeloma not having achieved remission: Secondary | ICD-10-CM | POA: Diagnosis not present

## 2017-02-01 DIAGNOSIS — Z905 Acquired absence of kidney: Secondary | ICD-10-CM | POA: Diagnosis not present

## 2017-02-01 DIAGNOSIS — I10 Essential (primary) hypertension: Secondary | ICD-10-CM | POA: Diagnosis not present

## 2017-02-01 DIAGNOSIS — E8589 Other amyloidosis: Secondary | ICD-10-CM | POA: Diagnosis not present

## 2017-02-01 DIAGNOSIS — M8580 Other specified disorders of bone density and structure, unspecified site: Secondary | ICD-10-CM | POA: Diagnosis not present

## 2017-02-01 DIAGNOSIS — Z7982 Long term (current) use of aspirin: Secondary | ICD-10-CM | POA: Diagnosis not present

## 2017-02-01 DIAGNOSIS — C642 Malignant neoplasm of left kidney, except renal pelvis: Secondary | ICD-10-CM | POA: Diagnosis not present

## 2017-02-01 DIAGNOSIS — Z79899 Other long term (current) drug therapy: Secondary | ICD-10-CM | POA: Diagnosis not present

## 2017-02-06 DIAGNOSIS — C9000 Multiple myeloma not having achieved remission: Secondary | ICD-10-CM | POA: Diagnosis not present

## 2017-02-06 DIAGNOSIS — Z5111 Encounter for antineoplastic chemotherapy: Secondary | ICD-10-CM | POA: Diagnosis not present

## 2017-02-07 DIAGNOSIS — Z5111 Encounter for antineoplastic chemotherapy: Secondary | ICD-10-CM | POA: Diagnosis not present

## 2017-02-07 DIAGNOSIS — C9000 Multiple myeloma not having achieved remission: Secondary | ICD-10-CM | POA: Diagnosis not present

## 2017-02-13 DIAGNOSIS — C9000 Multiple myeloma not having achieved remission: Secondary | ICD-10-CM | POA: Diagnosis not present

## 2017-02-13 DIAGNOSIS — Z5111 Encounter for antineoplastic chemotherapy: Secondary | ICD-10-CM | POA: Diagnosis not present

## 2017-02-14 DIAGNOSIS — C9000 Multiple myeloma not having achieved remission: Secondary | ICD-10-CM | POA: Diagnosis not present

## 2017-02-14 DIAGNOSIS — Z5111 Encounter for antineoplastic chemotherapy: Secondary | ICD-10-CM | POA: Diagnosis not present

## 2017-02-16 DIAGNOSIS — N429 Disorder of prostate, unspecified: Secondary | ICD-10-CM

## 2017-02-16 DIAGNOSIS — R69 Illness, unspecified: Secondary | ICD-10-CM | POA: Diagnosis not present

## 2017-02-16 HISTORY — DX: Disorder of prostate, unspecified: N42.9

## 2017-02-20 DIAGNOSIS — Z5111 Encounter for antineoplastic chemotherapy: Secondary | ICD-10-CM | POA: Diagnosis not present

## 2017-02-20 DIAGNOSIS — Z79899 Other long term (current) drug therapy: Secondary | ICD-10-CM | POA: Diagnosis not present

## 2017-02-20 DIAGNOSIS — C9000 Multiple myeloma not having achieved remission: Secondary | ICD-10-CM | POA: Diagnosis not present

## 2017-02-21 DIAGNOSIS — Z5111 Encounter for antineoplastic chemotherapy: Secondary | ICD-10-CM | POA: Diagnosis not present

## 2017-02-21 DIAGNOSIS — C9000 Multiple myeloma not having achieved remission: Secondary | ICD-10-CM | POA: Diagnosis not present

## 2017-03-06 DIAGNOSIS — Z79899 Other long term (current) drug therapy: Secondary | ICD-10-CM | POA: Diagnosis not present

## 2017-03-06 DIAGNOSIS — M8580 Other specified disorders of bone density and structure, unspecified site: Secondary | ICD-10-CM | POA: Diagnosis not present

## 2017-03-06 DIAGNOSIS — C9000 Multiple myeloma not having achieved remission: Secondary | ICD-10-CM | POA: Diagnosis not present

## 2017-03-06 DIAGNOSIS — N19 Unspecified kidney failure: Secondary | ICD-10-CM | POA: Diagnosis not present

## 2017-03-06 DIAGNOSIS — Z85528 Personal history of other malignant neoplasm of kidney: Secondary | ICD-10-CM | POA: Diagnosis not present

## 2017-03-06 DIAGNOSIS — I10 Essential (primary) hypertension: Secondary | ICD-10-CM | POA: Diagnosis not present

## 2017-03-06 DIAGNOSIS — R972 Elevated prostate specific antigen [PSA]: Secondary | ICD-10-CM | POA: Diagnosis not present

## 2017-03-06 DIAGNOSIS — Z7982 Long term (current) use of aspirin: Secondary | ICD-10-CM | POA: Diagnosis not present

## 2017-03-06 DIAGNOSIS — Z905 Acquired absence of kidney: Secondary | ICD-10-CM | POA: Diagnosis not present

## 2017-03-06 DIAGNOSIS — Z7983 Long term (current) use of bisphosphonates: Secondary | ICD-10-CM | POA: Diagnosis not present

## 2017-03-09 DIAGNOSIS — J302 Other seasonal allergic rhinitis: Secondary | ICD-10-CM | POA: Diagnosis not present

## 2017-03-09 DIAGNOSIS — Z85528 Personal history of other malignant neoplasm of kidney: Secondary | ICD-10-CM | POA: Diagnosis not present

## 2017-03-09 DIAGNOSIS — I10 Essential (primary) hypertension: Secondary | ICD-10-CM | POA: Diagnosis not present

## 2017-03-09 DIAGNOSIS — Z9889 Other specified postprocedural states: Secondary | ICD-10-CM | POA: Diagnosis not present

## 2017-03-09 DIAGNOSIS — R972 Elevated prostate specific antigen [PSA]: Secondary | ICD-10-CM | POA: Diagnosis not present

## 2017-03-09 DIAGNOSIS — C9000 Multiple myeloma not having achieved remission: Secondary | ICD-10-CM | POA: Diagnosis not present

## 2017-03-09 DIAGNOSIS — D291 Benign neoplasm of prostate: Secondary | ICD-10-CM | POA: Diagnosis not present

## 2017-03-09 DIAGNOSIS — Z905 Acquired absence of kidney: Secondary | ICD-10-CM | POA: Diagnosis not present

## 2017-03-11 ENCOUNTER — Encounter (HOSPITAL_COMMUNITY): Payer: Self-pay | Admitting: Emergency Medicine

## 2017-03-11 ENCOUNTER — Observation Stay (HOSPITAL_COMMUNITY)
Admission: EM | Admit: 2017-03-11 | Discharge: 2017-03-13 | Disposition: A | Discharge status: Home / Self Care | Payer: Medicare HMO | Attending: Internal Medicine | Admitting: Internal Medicine

## 2017-03-11 DIAGNOSIS — R338 Other retention of urine: Secondary | ICD-10-CM | POA: Diagnosis not present

## 2017-03-11 DIAGNOSIS — R1030 Lower abdominal pain, unspecified: Secondary | ICD-10-CM | POA: Diagnosis present

## 2017-03-11 DIAGNOSIS — N183 Chronic kidney disease, stage 3 unspecified: Secondary | ICD-10-CM | POA: Diagnosis present

## 2017-03-11 DIAGNOSIS — Q600 Renal agenesis, unilateral: Secondary | ICD-10-CM | POA: Insufficient documentation

## 2017-03-11 DIAGNOSIS — Z91040 Latex allergy status: Secondary | ICD-10-CM | POA: Diagnosis not present

## 2017-03-11 DIAGNOSIS — C649 Malignant neoplasm of unspecified kidney, except renal pelvis: Secondary | ICD-10-CM | POA: Diagnosis not present

## 2017-03-11 DIAGNOSIS — E860 Dehydration: Secondary | ICD-10-CM | POA: Insufficient documentation

## 2017-03-11 DIAGNOSIS — I10 Essential (primary) hypertension: Secondary | ICD-10-CM | POA: Diagnosis present

## 2017-03-11 DIAGNOSIS — Z880 Allergy status to penicillin: Secondary | ICD-10-CM | POA: Insufficient documentation

## 2017-03-11 DIAGNOSIS — N179 Acute kidney failure, unspecified: Secondary | ICD-10-CM | POA: Insufficient documentation

## 2017-03-11 DIAGNOSIS — N401 Enlarged prostate with lower urinary tract symptoms: Secondary | ICD-10-CM | POA: Diagnosis not present

## 2017-03-11 DIAGNOSIS — E871 Hypo-osmolality and hyponatremia: Secondary | ICD-10-CM | POA: Diagnosis not present

## 2017-03-11 DIAGNOSIS — Z7982 Long term (current) use of aspirin: Secondary | ICD-10-CM | POA: Insufficient documentation

## 2017-03-11 DIAGNOSIS — I129 Hypertensive chronic kidney disease with stage 1 through stage 4 chronic kidney disease, or unspecified chronic kidney disease: Secondary | ICD-10-CM | POA: Insufficient documentation

## 2017-03-11 DIAGNOSIS — Z7951 Long term (current) use of inhaled steroids: Secondary | ICD-10-CM | POA: Diagnosis not present

## 2017-03-11 DIAGNOSIS — Z79899 Other long term (current) drug therapy: Secondary | ICD-10-CM | POA: Insufficient documentation

## 2017-03-11 DIAGNOSIS — E876 Hypokalemia: Secondary | ICD-10-CM | POA: Insufficient documentation

## 2017-03-11 DIAGNOSIS — N1832 Chronic kidney disease, stage 3b: Secondary | ICD-10-CM | POA: Diagnosis present

## 2017-03-11 DIAGNOSIS — R339 Retention of urine, unspecified: Secondary | ICD-10-CM | POA: Diagnosis not present

## 2017-03-11 DIAGNOSIS — Z85528 Personal history of other malignant neoplasm of kidney: Secondary | ICD-10-CM | POA: Insufficient documentation

## 2017-03-11 HISTORY — DX: Chronic kidney disease, stage 3 (moderate): N18.3

## 2017-03-11 HISTORY — DX: Multiple myeloma not having achieved remission: C90.00

## 2017-03-11 HISTORY — DX: Chronic kidney disease, stage 3 unspecified: N18.30

## 2017-03-11 LAB — BASIC METABOLIC PANEL
Anion gap: 13 (ref 5–15)
BUN: 15 mg/dL (ref 6–20)
CO2: 21 mmol/L — ABNORMAL LOW (ref 22–32)
Calcium: 8.1 mg/dL — ABNORMAL LOW (ref 8.9–10.3)
Chloride: 87 mmol/L — ABNORMAL LOW (ref 101–111)
Creatinine, Ser: 1.98 mg/dL — ABNORMAL HIGH (ref 0.61–1.24)
GFR calc Af Amer: 38 mL/min — ABNORMAL LOW (ref >60)
GFR calc non Af Amer: 32 mL/min — ABNORMAL LOW (ref >60)
Glucose, Bld: 138 mg/dL — ABNORMAL HIGH (ref 65–99)
Potassium: 3 mmol/L — ABNORMAL LOW (ref 3.5–5.1)
Sodium: 121 mmol/L — ABNORMAL LOW (ref 135–145)

## 2017-03-11 LAB — URINALYSIS, ROUTINE W REFLEX MICROSCOPIC
Bacteria, UA: NONE SEEN
Bilirubin Urine: NEGATIVE
Glucose, UA: NEGATIVE mg/dL
Ketones, ur: NEGATIVE mg/dL
Leukocytes, UA: NEGATIVE
Nitrite: NEGATIVE
Protein, ur: NEGATIVE mg/dL
Specific Gravity, Urine: 1.008 (ref 1.005–1.030)
Squamous Epithelial / LPF: NONE SEEN
WBC, UA: NONE SEEN WBC/hpf (ref 0–5)
pH: 6 (ref 5.0–8.0)

## 2017-03-11 LAB — CBC WITH DIFFERENTIAL/PLATELET
Basophils Absolute: 0 10*3/uL (ref 0.0–0.1)
Basophils Relative: 0 %
Eosinophils Absolute: 0 10*3/uL (ref 0.0–0.7)
Eosinophils Relative: 0 %
HCT: 27 % — ABNORMAL LOW (ref 39.0–52.0)
Hemoglobin: 9.5 g/dL — ABNORMAL LOW (ref 13.0–17.0)
Lymphocytes Relative: 14 %
Lymphs Abs: 0.9 10*3/uL (ref 0.7–4.0)
MCH: 31.3 pg (ref 26.0–34.0)
MCHC: 35.2 g/dL (ref 30.0–36.0)
MCV: 88.8 fL (ref 78.0–100.0)
Monocytes Absolute: 0.6 10*3/uL (ref 0.1–1.0)
Monocytes Relative: 10 %
Neutro Abs: 4.8 10*3/uL (ref 1.7–7.7)
Neutrophils Relative %: 76 %
Platelets: 242 10*3/uL (ref 150–400)
RBC: 3.04 MIL/uL — ABNORMAL LOW (ref 4.22–5.81)
RDW: 13.8 % (ref 11.5–15.5)
WBC: 6.3 10*3/uL (ref 4.0–10.5)

## 2017-03-11 MED ORDER — ENOXAPARIN SODIUM 40 MG/0.4ML ~~LOC~~ SOLN
40.0000 mg | SUBCUTANEOUS | Status: DC
  Administered 2017-03-12: 40 mg via SUBCUTANEOUS
  Filled 2017-03-11: qty 0.4

## 2017-03-11 MED ORDER — SODIUM CHLORIDE 0.9 % IV SOLN: Freq: Once | INTRAVENOUS | Status: DC

## 2017-03-11 MED ORDER — CALCIUM GLUCONATE 10 % IV SOLN
1.0000 g | Freq: Once | INTRAVENOUS | Status: AC
  Administered 2017-03-11: 1 g via INTRAVENOUS
  Filled 2017-03-11: qty 10

## 2017-03-11 MED ORDER — ALBUTEROL SULFATE (2.5 MG/3ML) 0.083% IN NEBU: 3.0000 mL | INHALATION_SOLUTION | Freq: Four times a day (QID) | RESPIRATORY_TRACT | Status: DC | PRN

## 2017-03-11 MED ORDER — LACTULOSE 10 GM/15ML PO SOLN: 20.0000 g | Freq: Every day | ORAL | Status: DC | PRN

## 2017-03-11 MED ORDER — POTASSIUM CHLORIDE CRYS ER 20 MEQ PO TBCR
40.0000 meq | EXTENDED_RELEASE_TABLET | Freq: Once | ORAL | Status: AC
  Administered 2017-03-11: 40 meq via ORAL
  Filled 2017-03-11: qty 2

## 2017-03-11 MED ORDER — AMLODIPINE BESYLATE 5 MG PO TABS
10.0000 mg | ORAL_TABLET | Freq: Every day | ORAL | Status: DC
  Administered 2017-03-12 – 2017-03-13 (×2): 10 mg via ORAL
  Filled 2017-03-11 (×2): qty 2

## 2017-03-11 MED ORDER — POLYETHYLENE GLYCOL 3350 17 G PO PACK: 17.0000 g | PACK | Freq: Every day | ORAL | Status: DC | PRN

## 2017-03-11 MED ORDER — SODIUM CHLORIDE 0.9 % IV SOLN
Freq: Once | INTRAVENOUS | Status: AC
  Administered 2017-03-11: 22:00:00 via INTRAVENOUS

## 2017-03-11 NOTE — ED Provider Notes (Signed)
Medical screening examination/treatment/procedure(s) were conducted as a shared visit with non-physician practitioner(s) and myself.  I personally evaluated the patient during the encounter.   EKG Interpretation None      71 year old male who presents with urinary retention Starting this morning. History of multiple myeloma and renal cell carcinoma who [facility]. He did have one episode of nausea and vomiting prior to arrival. No fevers or chills. No dysuria or frequency prior to retention.  Retaining 2.5 L of urine on presentation, fully catheter was placed. Normal renal function and no evidence of UTI on UA. However does have significant hyponatremia of 121. Initially may be hypovolemic, but have significant amount of urine output in ED, ? SiADH. Admitted to hospitalist service by Dr. Alcario Drought.   CRITICAL CARE Performed by: Forde Dandy   Total critical care time: 31 minutes  Critical care time was exclusive of separately billable procedures and treating other patients.  Critical care was necessary to treat or prevent imminent or life-threatening deterioration.  Critical care was time spent personally by me on the following activities: development of treatment plan with patient and/or surrogate as well as nursing, discussions with consultants, evaluation of patient's response to treatment, examination of patient, obtaining history from patient or surrogate, ordering and performing treatments and interventions, ordering and review of laboratory studies, ordering and review of radiographic studies, pulse oximetry and re-evaluation of patient's condition.    Forde Dandy, MD 03/12/17 1230

## 2017-03-11 NOTE — H&P (Signed)
History and Physical    Waleed Dettman QQP:[id number] DOB: [birth year] DOA: 03/11/2017  PCP: Tivis Ringer, MD  Patient coming from: Home  I have personally briefly reviewed patient's old medical records in [place]  Chief Complaint: abd pain  HPI: Marcus Hatfield is a 71 y.o. male with medical history significant of renal cell CA, solitary kidney after resection, MM, CKD stage 3, prostate biopsy on Thursday.  Patient with N/V onset earlier today just PTA.  Urinary retention and lower abd pain onset 0900 today.  Symptoms are persistent, worsening.  Nothing makes better or worse.   ED Course: 2.5L UOP in ED after foley placed.  Sodium 121, K 3.0.   Review of Systems: As per HPI otherwise 10 point review of systems negative.   Past Medical History:  Diagnosis Date  . Arthritis   . Hypertension   . Prostate disorder     Past Surgical History:  Procedure Laterality Date  . KNEE SURGERY    . NEPHRECTOMY       reports that he has never smoked. He does not have any smokeless tobacco history on file. He reports that he does not drink alcohol or use drugs.  Allergies  Allergen Reactions  . Penicillins Hives    Has patient had a PCN reaction causing immediate rash, facial/tongue/throat swelling, SOB or lightheadedness with hypotension:Yes Has patient had a PCN reaction causing severe rash involving mucus membranes or skin necrosis: Yes Has patient had a PCN reaction that required hospitalization No Has patient had a PCN reaction occurring within the last 10 years: No If all of the above answers are "NO", then may proceed with Cephalosporin use.   . Latex Rash    Family History  Problem Relation Age of Onset  . Diabetes Father      Prior to Admission medications   Medication Sig Start Date End Date Taking? Authorizing Provider  acyclovir (ZOVIRAX) 400 MG tablet Take 400 mg by mouth daily. 02/01/17  Yes Historical Provider, MD  albuterol (PROVENTIL HFA;VENTOLIN HFA)  108 (90 BASE) MCG/ACT inhaler Inhale 1-2 puffs into the lungs every 6 (six) hours as needed for wheezing. 02/12/12 03/11/17 Yes Harden Mo, MD  amLODipine (NORVASC) 10 MG tablet Take 10 mg by mouth daily.  12/28/15  Yes Historical Provider, MD  aspirin EC 81 MG tablet Take 81 mg by mouth daily. 06/22/16  Yes Historical Provider, MD  cyclophosphamide (CYTOXAN) 50 MG capsule Take 600 mg by mouth every 7 (seven) days. Take days 1, 8, and 15 of 28 day cycle on 1st day of IV chemo on weeks with IV chemo 01/23/17  Yes Historical Provider, MD  hydrochlorothiazide (HYDRODIURIL) 25 MG tablet Take 25 mg by mouth daily.  12/28/15  Yes Historical Provider, MD  irbesartan (AVAPRO) 300 MG tablet Take 300 mg by mouth daily.  12/28/15  Yes Historical Provider, MD  mometasone (NASONEX) 50 MCG/ACT nasal spray Place 2 sprays into the nose daily.   Yes Historical Provider, MD  Multiple Vitamins-Minerals (MULTIVITAMIN WITH MINERALS) tablet Take 1 tablet by mouth daily.   Yes Historical Provider, MD  polyethylene glycol powder (GLYCOLAX/MIRALAX) powder Take 17 g by mouth daily as needed for constipation. 03/09/17  Yes Historical Provider, MD  pseudoephedrine-guaifenesin (MUCINEX D) 60-600 MG 12 hr tablet Take 1 tablet by mouth every 12 (twelve) hours as needed for congestion.    Yes Historical Provider, MD  lactulose (CHRONULAC) 10 GM/15ML solution Take 30 mLs by mouth daily as needed for constipation. 12/07/16  Historical Provider, MD    Physical Exam: Vitals:   03/11/17 2033 03/11/17 2100 03/11/17 2130 03/11/17 2200  BP: 135/89 134/79 127/83 129/85  Pulse: 84 83 78 86  Resp: 12   14  Temp: 97.5 F (36.4 C)     TempSrc: Oral     SpO2: 100% 100% 99% 100%  Weight:      Height:        Constitutional: NAD, calm, comfortable Eyes: PERRL, lids and conjunctivae normal ENMT: Mucous membranes are moist. Posterior pharynx clear of any exudate or lesions.Normal dentition.  Neck: normal, supple, no masses, no  thyromegaly Respiratory: clear to auscultation bilaterally, no wheezing, no crackles. Normal respiratory effort. No accessory muscle use.  Cardiovascular: Regular rate and rhythm, no murmurs / rubs / gallops. No extremity edema. 2+ pedal pulses. No carotid bruits.  Abdomen: no tenderness, no masses palpated. No hepatosplenomegaly. Bowel sounds positive.  Musculoskeletal: no clubbing / cyanosis. No joint deformity upper and lower extremities. Good ROM, no contractures. Normal muscle tone.  Skin: no rashes, lesions, ulcers. No induration Neurologic: CN 2-12 grossly intact. Sensation intact, DTR normal. Strength 5/5 in all 4.  Psychiatric: Normal judgment and insight. Alert and oriented x 3. Normal mood.    Labs on Admission: I have personally reviewed following labs and imaging studies  CBC:  Recent Labs Lab 03/11/17 1905  WBC 6.3  NEUTROABS 4.8  HGB 9.5*  HCT 27.0*  MCV 88.8  PLT 161   Basic Metabolic Panel:  Recent Labs Lab 03/11/17 1905  NA 121*  K 3.0*  CL 87*  CO2 21*  GLUCOSE 138*  BUN 15  CREATININE 1.98*  CALCIUM 8.1*   GFR: Estimated Creatinine Clearance: 40.9 mL/min (A) (by C-G formula based on SCr of 1.98 mg/dL (H)). Liver Function Tests: No results for input(s): AST, ALT, ALKPHOS, BILITOT, PROT, ALBUMIN in the last 168 hours. No results for input(s): LIPASE, AMYLASE in the last 168 hours. No results for input(s): AMMONIA in the last 168 hours. Coagulation Profile: No results for input(s): INR, PROTIME in the last 168 hours. Cardiac Enzymes: No results for input(s): CKTOTAL, CKMB, CKMBINDEX, TROPONINI in the last 168 hours. BNP (last 3 results) No results for input(s): PROBNP in the last 8760 hours. HbA1C: No results for input(s): HGBA1C in the last 72 hours. CBG: No results for input(s): GLUCAP in the last 168 hours. Lipid Profile: No results for input(s): CHOL, HDL, LDLCALC, TRIG, CHOLHDL, LDLDIRECT in the last 72 hours. Thyroid Function Tests: No  results for input(s): TSH, T4TOTAL, FREET4, T3FREE, THYROIDAB in the last 72 hours. Anemia Panel: No results for input(s): VITAMINB12, FOLATE, FERRITIN, TIBC, IRON, RETICCTPCT in the last 72 hours. Urine analysis:    Component Value Date/Time   COLORURINE YELLOW 03/11/2017 1910   APPEARANCEUR CLEAR 03/11/2017 1910   LABSPEC 1.008 03/11/2017 1910   PHURINE 6.0 03/11/2017 1910   GLUCOSEU NEGATIVE 03/11/2017 1910   HGBUR LARGE (A) 03/11/2017 1910   BILIRUBINUR NEGATIVE 03/11/2017 1910   KETONESUR NEGATIVE 03/11/2017 1910   PROTEINUR NEGATIVE 03/11/2017 1910   UROBILINOGEN 0.2 06/30/2013 0925   NITRITE NEGATIVE 03/11/2017 1910   LEUKOCYTESUR NEGATIVE 03/11/2017 1910    Radiological Exams on Admission: No results found.  EKG: Independently reviewed.  Assessment/Plan Principal Problem:   Hyponatremia Active Problems:   Acute urinary retention   HTN (hypertension)   CKD (chronic kidney disease) stage 3, GFR 30-59 ml/min    1. Hyponatremia - Likely secondary to urinary retention and HCTZ not helping 1.  Hold HCTZ 2. Hold lisinopril 3. Foley in place with 2.5L output 4. Will put on regular diet 5. If no improvement consider fluid restriction but typically urinary retention induced SIADH resolves rapidly after foley placement 6. Repeat BMP in AM 2. HTN - 1. Hold HCTZ and lisinopril 2. Continue norvasc 3. CKD stage 3 - chronic, baseline, due to solitary kidney, and possibly MM too 4. MM - being treated by [facility] heme onc 1. Cytoxan weekly 2. But last took this 2 weeks ago 3. Will hold this for the moment as cytoxan can cause SIADH (though usually this requires high dose IV cytoxan, so I doubt its causing todays hyponatremia). 5. Hypokalemia - replace K  DVT prophylaxis: Lovenox Code Status: Full Family Communication: Family at bedside Disposition Plan: Home after admit Consults called: None Admission status: Place in [place], [place] Hospitalists Pager  [phone]  If 7AM-7PM, please contact day team taking care of patient [email or web address] Password [facility]  03/11/2017, 10:08 PM

## 2017-03-11 NOTE — ED Notes (Signed)
RN AND MD NOTIFIED OF PATIENT'S  Na LEVEL OF 117 AND iCa  LEVEL OF 0.56

## 2017-03-11 NOTE — ED Notes (Signed)
Pt's foley bag drained, 2500 ml straw colored urine.  MD aware.

## 2017-03-11 NOTE — ED Notes (Signed)
>  927mL on bladder scanner.

## 2017-03-11 NOTE — ED Notes (Signed)
UNSUCCESSFUL ATTEMPT TO COLLECT BLOOD SAMPLE

## 2017-03-11 NOTE — ED Notes (Signed)
RN AND MD NOTIFIED OF PATIENT'S Na LEVEL OF 117 AND iCa level oF 0.56

## 2017-03-11 NOTE — ED Notes (Signed)
NS stopped per hospitalist order.

## 2017-03-11 NOTE — ED Provider Notes (Signed)
[facility] DEPT Provider Note   CSN: [id number] Arrival date & time: 03/11/17  1816     History   Chief Complaint Chief Complaint  Patient presents with  . Urinary Retention    HPI Marcus Hatfield is a 71 y.o. male.  Marcus Hatfield is a 71 y.o. Male with a history of multiple myeloma, and renal carcinoma, who presents to the emergency department complaining of urinary retention since this morning. Patient reports he had some difficulty initiating the stream of his urine this morning. He reports gradually today he's had more difficulty urinating and now has not been able to urinate. He reports a pink tinge to his urine today. He had prostate biopsy 1 week ago at [facility]. He tells me they performed this due to an elevated PSA. He developed some nausea and vomiting after arrival to the emergency department today and vomited once. He reports pain to his suprapubic area. He has a history of a right renal carcinoma and only has a left kidney. He denies fevers, hematemesis, diarrhea, rectal pain, back pain, chest pain, shortness of breath or coughing.   The history is provided by the patient and medical records. No language interpreter was used.    Past Medical History:  Diagnosis Date  . Arthritis   . CKD (chronic kidney disease) stage 3, GFR 30-59 ml/min    Creat 1.9  . Hypertension   . Multiple myeloma ([place])    [facility]  . Prostate disorder     Patient Active Problem List   Diagnosis Date Noted  . Acute urinary retention 03/11/2017  . Hyponatremia 03/11/2017  . HTN (hypertension) 03/11/2017  . CKD (chronic kidney disease) stage 3, GFR 30-59 ml/min 03/11/2017  . External hemorrhoid, thrombosed 04/16/2012    Past Surgical History:  Procedure Laterality Date  . KNEE SURGERY    . NEPHRECTOMY         Home Medications    Prior to Admission medications   Medication Sig Start Date End Date Taking? Authorizing Provider  acyclovir (ZOVIRAX) 400 MG tablet Take  400 mg by mouth daily. 02/01/17  Yes Historical Provider, MD  albuterol (PROVENTIL HFA;VENTOLIN HFA) 108 (90 BASE) MCG/ACT inhaler Inhale 1-2 puffs into the lungs every 6 (six) hours as needed for wheezing. 02/12/12 03/11/17 Yes Harden Mo, MD  amLODipine (NORVASC) 10 MG tablet Take 10 mg by mouth daily.  12/28/15  Yes Historical Provider, MD  aspirin EC 81 MG tablet Take 81 mg by mouth daily. 06/22/16  Yes Historical Provider, MD  cyclophosphamide (CYTOXAN) 50 MG capsule Take 600 mg by mouth every 7 (seven) days. Take days 1, 8, and 15 of 28 day cycle on 1st day of IV chemo on weeks with IV chemo 01/23/17  Yes Historical Provider, MD  hydrochlorothiazide (HYDRODIURIL) 25 MG tablet Take 25 mg by mouth daily.  12/28/15  Yes Historical Provider, MD  irbesartan (AVAPRO) 300 MG tablet Take 300 mg by mouth daily.  12/28/15  Yes Historical Provider, MD  mometasone (NASONEX) 50 MCG/ACT nasal spray Place 2 sprays into the nose daily.   Yes Historical Provider, MD  Multiple Vitamins-Minerals (MULTIVITAMIN WITH MINERALS) tablet Take 1 tablet by mouth daily.   Yes Historical Provider, MD  polyethylene glycol powder (GLYCOLAX/MIRALAX) powder Take 17 g by mouth daily as needed for constipation. 03/09/17  Yes Historical Provider, MD  pseudoephedrine-guaifenesin (MUCINEX D) 60-600 MG 12 hr tablet Take 1 tablet by mouth every 12 (twelve) hours as needed for congestion.  Yes Historical Provider, MD  lactulose (CHRONULAC) 10 GM/15ML solution Take 30 mLs by mouth daily as needed for constipation. 12/07/16   Historical Provider, MD    Family History Family History  Problem Relation Age of Onset  . Diabetes Father     Social History Social History  Substance Use Topics  . Smoking status: Never Smoker  . Smokeless tobacco: Not on file  . Alcohol use No     Allergies   Penicillins and Latex   Review of Systems Review of Systems  Constitutional: Negative for chills and fever.  HENT: Negative for congestion and  sore throat.   Eyes: Negative for visual disturbance.  Respiratory: Negative for cough and shortness of breath.   Cardiovascular: Negative for chest pain.  Gastrointestinal: Positive for abdominal pain, nausea and vomiting. Negative for blood in stool and diarrhea.  Genitourinary: Positive for decreased urine volume, difficulty urinating and hematuria. Negative for flank pain, genital sores, penile pain and testicular pain.  Musculoskeletal: Negative for back pain and neck pain.  Skin: Negative for rash.  Neurological: Negative for headaches.     Physical Exam Updated Vital Signs BP 132/84 (BP Location: Right Arm)   Pulse 82   Temp 98.5 F (36.9 C) (Oral)   Resp 16   Ht 5' 10"  (1.778 m)   Wt 98.4 kg   SpO2 100%   BMI 31.14 kg/m   Physical Exam  Constitutional: He appears well-developed and well-nourished. No distress.  Nontoxic appearing.  HENT:  Head: Normocephalic and atraumatic.  Eyes: Conjunctivae are normal. Pupils are equal, round, and reactive to light. Right eye exhibits no discharge. Left eye exhibits no discharge.  Neck: Neck supple.  Cardiovascular: Normal rate, regular rhythm, normal heart sounds and intact distal pulses.   Pulmonary/Chest: Effort normal and breath sounds normal. No respiratory distress. He has no wheezes. He has no rales.  Abdominal: Soft. Bowel sounds are normal. He exhibits no mass. There is tenderness. There is no guarding.  Abdomen is soft. Bowel sounds are present. Patient has suprapubic abdominal tenderness to palpation. No peritoneal signs.  Genitourinary: No penile tenderness.  Genitourinary Comments: Patient is uncircumcised. Small amount of blood at the tip of his meatus of his penis. No penile or testicular tenderness to palpation. No GU rashes noted.  Lymphadenopathy:    He has no cervical adenopathy.  Neurological: He is alert. Coordination normal.  Skin: Skin is warm and dry. Capillary refill takes less than 2 seconds. No rash  noted. He is not diaphoretic. No erythema. No pallor.  Psychiatric: He has a normal mood and affect. His behavior is normal.  Nursing note and vitals reviewed.    ED Treatments / Results  Labs (all labs ordered are listed, but only abnormal results are displayed) Labs Reviewed  URINALYSIS, ROUTINE W REFLEX MICROSCOPIC - Abnormal; Notable for the following:       Result Value   Hgb urine dipstick LARGE (*)    All other components within normal limits  BASIC METABOLIC PANEL - Abnormal; Notable for the following:    Sodium 121 (*)    Potassium 3.0 (*)    Chloride 87 (*)    CO2 21 (*)    Glucose, Bld 138 (*)    Creatinine, Ser 1.98 (*)    Calcium 8.1 (*)    GFR calc non Af Amer 32 (*)    GFR calc Af Amer 38 (*)    All other components within normal limits  CBC WITH DIFFERENTIAL/PLATELET -  Abnormal; Notable for the following:    RBC 3.04 (*)    Hemoglobin 9.5 (*)    HCT 27.0 (*)    All other components within normal limits  BASIC METABOLIC PANEL  OSMOLALITY, URINE  NA AND K (SODIUM & POTASSIUM), RAND UR  I-STAT CHEM 8, ED    EKG  EKG Interpretation None       Radiology No results found.  Procedures Procedures (including critical care time)  CRITICAL CARE Performed by: Hanley Hays   Total critical care time: 40 minutes  Critical care time was exclusive of separately billable procedures and treating other patients.  Critical care was necessary to treat or prevent imminent or life-threatening deterioration.  Critical care was time spent personally by me on the following activities: development of treatment plan with patient and/or surrogate as well as nursing, discussions with consultants, evaluation of patient's response to treatment, examination of patient, obtaining history from patient or surrogate, ordering and performing treatments and interventions, ordering and review of laboratory studies, ordering and review of radiographic studies, pulse  oximetry and re-evaluation of patient's condition.  Medications Ordered in ED Medications  lactulose (CHRONULAC) 10 GM/15ML solution 20 g (not administered)  amLODipine (NORVASC) tablet 10 mg (not administered)  albuterol (PROVENTIL) (2.5 MG/3ML) 0.083% nebulizer solution 3 mL (not administered)  polyethylene glycol (MIRALAX / GLYCOLAX) packet 17 g (not administered)  enoxaparin (LOVENOX) injection 40 mg (not administered)  0.9 %  sodium chloride infusion ( Intravenous Stopped 03/11/17 2154)  potassium chloride SA (K-DUR,KLOR-CON) CR tablet 40 mEq (40 mEq Oral Given 03/11/17 2138)  calcium gluconate 1 g in sodium chloride 0.9 % 100 mL IVPB (0 g Intravenous Stopped 03/11/17 2336)     Initial Impression / Assessment and Plan / ED Course  I have reviewed the triage vital signs and the nursing notes.  Pertinent labs & imaging results that were available during my care of the patient were reviewed by me and considered in my medical decision making (see chart for details).    This is a 71 y.o. Male with a history of multiple myeloma, and renal carcinoma, who presents to the emergency department complaining of urinary retention since this morning. Patient reports he had some difficulty initiating the stream of his urine this morning. He reports gradually today he's had more difficulty urinating and now has not been able to urinate. He reports a pink tinge to his urine today. He had prostate biopsy 1 week ago at [facility]. He tells me they performed this due to an elevated PSA. He developed some nausea and vomiting after arrival to the emergency department today and vomited once. He reports pain to his suprapubic area. On exam patient is afebrile and nontoxic appearing. His tenderness to his suprapubic area on exam. GU exam is remarkable for an uncircumcised penis with some blood at his meatus.  Bladder scan showed greater than 1000 and also urine in his bladder. Foley catheter inserted without  difficulty. Patient reports feeling much better after Foley catheter inserted.  BMP shows hyponatremia with a sodium of 121. Potassium is 3.0. Creatinine is 1.98. Patient has chronic kidney disease. Repeat i-STAT Chem-8 shows sodium of 117. Patient started on normal saline at 100 melena hour. Will admit to medicine for hyponatremia. Patient agrees with plan.   I consulted with Dr. Alcario Drought who accepted the patient for admission.   This patient was discussed with and evaluated by Dr. Oleta Mouse who agrees with assessment and plan.   Final Clinical Impressions(s) /  ED Diagnoses   Final diagnoses:  Hyponatremia  Acute urinary retention  Hypokalemia    New Prescriptions Current Discharge Medication List       Waynetta Pean, PA-C 03/12/17 Fairview Liu, MD 03/12/17 [phone]

## 2017-03-11 NOTE — ED Triage Notes (Addendum)
Pt complaint of urinary retention onset 0900 today; "had kidney biopsy on Thursday." N/V onset just PTA.

## 2017-03-12 ENCOUNTER — Encounter (HOSPITAL_COMMUNITY): Payer: Self-pay | Admitting: *Deleted

## 2017-03-12 DIAGNOSIS — N179 Acute kidney failure, unspecified: Secondary | ICD-10-CM

## 2017-03-12 DIAGNOSIS — C649 Malignant neoplasm of unspecified kidney, except renal pelvis: Secondary | ICD-10-CM | POA: Diagnosis not present

## 2017-03-12 DIAGNOSIS — N183 Chronic kidney disease, stage 3 (moderate): Secondary | ICD-10-CM | POA: Diagnosis not present

## 2017-03-12 DIAGNOSIS — E871 Hypo-osmolality and hyponatremia: Secondary | ICD-10-CM

## 2017-03-12 DIAGNOSIS — R338 Other retention of urine: Secondary | ICD-10-CM | POA: Diagnosis not present

## 2017-03-12 DIAGNOSIS — E876 Hypokalemia: Secondary | ICD-10-CM

## 2017-03-12 DIAGNOSIS — I10 Essential (primary) hypertension: Secondary | ICD-10-CM

## 2017-03-12 DIAGNOSIS — C9000 Multiple myeloma not having achieved remission: Secondary | ICD-10-CM

## 2017-03-12 LAB — BASIC METABOLIC PANEL
Anion gap: 9 (ref 5–15)
BUN: 16 mg/dL (ref 6–20)
CO2: 23 mmol/L (ref 22–32)
Calcium: 8.2 mg/dL — ABNORMAL LOW (ref 8.9–10.3)
Chloride: 96 mmol/L — ABNORMAL LOW (ref 101–111)
Creatinine, Ser: 1.85 mg/dL — ABNORMAL HIGH (ref 0.61–1.24)
GFR calc Af Amer: 41 mL/min — ABNORMAL LOW (ref >60)
GFR calc non Af Amer: 35 mL/min — ABNORMAL LOW (ref >60)
Glucose, Bld: 94 mg/dL (ref 65–99)
Potassium: 3.3 mmol/L — ABNORMAL LOW (ref 3.5–5.1)
Sodium: 128 mmol/L — ABNORMAL LOW (ref 135–145)

## 2017-03-12 LAB — OSMOLALITY, URINE: Osmolality, Ur: 166 mOsm/kg — ABNORMAL LOW (ref 300–900)

## 2017-03-12 LAB — NA AND K (SODIUM & POTASSIUM), RAND UR
Potassium Urine: 13 mmol/L
Sodium, Ur: 48 mmol/L

## 2017-03-12 MED ORDER — TAMSULOSIN HCL 0.4 MG PO CAPS
0.4000 mg | ORAL_CAPSULE | Freq: Every day | ORAL | Status: DC
  Administered 2017-03-12: 0.4 mg via ORAL
  Filled 2017-03-12: qty 1

## 2017-03-12 MED ORDER — SODIUM CHLORIDE 0.9 % IV SOLN
INTRAVENOUS | Status: AC
  Administered 2017-03-12: 09:00:00 via INTRAVENOUS

## 2017-03-12 MED ORDER — POTASSIUM CHLORIDE CRYS ER 20 MEQ PO TBCR
40.0000 meq | EXTENDED_RELEASE_TABLET | ORAL | Status: AC
  Administered 2017-03-12 (×2): 40 meq via ORAL
  Filled 2017-03-12 (×2): qty 2

## 2017-03-12 NOTE — Care Management Note (Signed)
Case Management Note  Patient Details  Name: Marcus Hatfield MRN: [id number] Date of Birth: Oct 20, 1946  Subjective/Objective:  Hyponatremia, acute on chronic renal failure                   Action/Plan: Discharge Planning: NCM spoke to pt and wife at bedside. States he has [employer]. Made copy of card and faxed to admission office. Pt states he was independent prior to hospital stay. Will continue to follow for dc needs. \  PCP Prince Solian MD  Expected Discharge Date:  03/12/17               Expected Discharge Plan:  Home/Self Care  In-House Referral:  NA  Discharge planning Services  CM Consult  Post Acute Care Choice:  NA Choice offered to:  NA  DME Arranged:  N/A DME Agency:  NA  HH Arranged:  NA HH Agency:  NA  Status of Service:  Completed, signed off  If discussed at [place] of Stay Meetings, dates discussed:    Additional Comments:  Erenest Rasher, RN 03/12/2017, 3:59 PM

## 2017-03-12 NOTE — Progress Notes (Signed)
[facility] PROGRESS NOTE  Britney Captain PZW:[id number] DOB: 02-Oct-1946 DOA: 03/11/2017 PCP: Tivis Ringer, MD  Interim summary and HPI  71 y.o. male with medical history significant of renal cell CA, solitary kidney after resection, MM, CKD stage 3 and prostate biopsy on Thursday.  Patient with N/V onset earlier today just PTA.  Urinary retention and lower abd pain.   Found to have severe hyponatremia, acute on chronic renal failure, hypokalemia and with urinary retention (2.5L of urine).  Assessment/Plan: 1-hyponatremia: -most likely associated with urinary retention, dehydration and continue use of HCTZ -improved with IVF's, stopping diuretics and relieving urinary retention -will follow trend   2-acute on chronic renal failure: stage 3 at baseline  -due to urinary retention, continue use of nephrotoxic agents and dehydration. -will continue holding nephrotoxic agents -continue IVF's for another 10-12 hours -keep foley in place  -follow renal function trend   3-acute urinary retention -most likely associated with BPH -will start flomax -continue to keep foley in place  4-HTN -continue norvasc -BP controlled currently  5-hypokalemia -will replete as needed and follow trend  6-MM and renal cell carcinoma -continue outpatient follow up with hem/onc -patient on chemotherapy and receiving cytoxan   Code Status: Full Family Communication: daughter and wife at bedside  Disposition Plan: will continue gentle IVF's, star flomax, replete electrolytes and follow sodium trend. Anticipate discharge on 03/13/17 if he remains stable    Consultants:  None   Procedures:  See below for x-ray reports   Antibiotics:  None   HPI/Subjective: Afebrile, no CP, no SOB. Patient denies nausea and vomiting. Patient without further sensation of lightheadedness/woozy sensation. Had 2.5L of retained urine after foley catheter placed.    Objective: Vitals:   03/11/17 2358  03/12/17 0500  BP: 132/84 116/65  Pulse: 82 83  Resp: 16 14  Temp: 98.5 F (36.9 C) 98.5 F (36.9 C)    Intake/Output Summary (Last 24 hours) at 03/12/17 1321 Last data filed at 03/12/17 1026  Gross per 24 hour  Intake              540 ml  Output             1375 ml  Net             -835 ml   Filed Weights   03/11/17 1819  Weight: 98.4 kg (217 lb)    Exam:   General:  Afebrile, AAOX3; feeling better overall. Denies CP, SOB and also endorses no further lightheadedness/woozy sensation.  Cardiovascular: S1 and S2, no rubs, no gallops  Respiratory: good air movement, no wheezing, no crackels  Abdomen: soft, no tenderness, positive BS  Musculoskeletal: no cyanosis or clubbing   Data Reviewed: Basic Metabolic Panel:  Recent Labs Lab 03/11/17 1905 03/12/17 0415  NA 121* 128*  K 3.0* 3.3*  CL 87* 96*  CO2 21* 23  GLUCOSE 138* 94  BUN 15 16  CREATININE 1.98* 1.85*  CALCIUM 8.1* 8.2*   CBC:  Recent Labs Lab 03/11/17 1905  WBC 6.3  NEUTROABS 4.8  HGB 9.5*  HCT 27.0*  MCV 88.8  PLT 242    Studies: No results found.  Scheduled Meds: . amLODipine  10 mg Oral Daily  . enoxaparin (LOVENOX) injection  40 mg Subcutaneous Q24H  . tamsulosin  0.4 mg Oral QPC supper   Continuous Infusions: . sodium chloride 75 mL/hr at 03/12/17 0846    Principal Problem:   Hyponatremia Active Problems:   Acute urinary retention  HTN (hypertension)   CKD (chronic kidney disease) stage 3, GFR 30-59 ml/min    Time spent: 25 minutes    Barton Dubois  [facility] Pager [phone]. If 7PM-7AM, please contact night-coverage at [email or web address], password [facility] 03/12/2017, 1:21 PM  LOS: 0 days

## 2017-03-12 NOTE — Care Management Obs Status (Signed)
[facility] NOTIFICATION   Patient Details  Name: Hassell Patras MRN: [id number] Date of Birth: October 08, 1946   Medicare Observation Status Notification Given:  Yes    Erenest Rasher, RN 03/12/2017, 3:58 PM

## 2017-03-13 DIAGNOSIS — E876 Hypokalemia: Secondary | ICD-10-CM | POA: Diagnosis not present

## 2017-03-13 DIAGNOSIS — I10 Essential (primary) hypertension: Secondary | ICD-10-CM | POA: Diagnosis not present

## 2017-03-13 DIAGNOSIS — C649 Malignant neoplasm of unspecified kidney, except renal pelvis: Secondary | ICD-10-CM

## 2017-03-13 DIAGNOSIS — C9000 Multiple myeloma not having achieved remission: Secondary | ICD-10-CM | POA: Diagnosis not present

## 2017-03-13 DIAGNOSIS — N179 Acute kidney failure, unspecified: Secondary | ICD-10-CM | POA: Diagnosis not present

## 2017-03-13 DIAGNOSIS — E871 Hypo-osmolality and hyponatremia: Secondary | ICD-10-CM | POA: Diagnosis not present

## 2017-03-13 DIAGNOSIS — R338 Other retention of urine: Secondary | ICD-10-CM | POA: Diagnosis not present

## 2017-03-13 DIAGNOSIS — N183 Chronic kidney disease, stage 3 (moderate): Secondary | ICD-10-CM | POA: Diagnosis not present

## 2017-03-13 LAB — BASIC METABOLIC PANEL
Anion gap: 3 — ABNORMAL LOW (ref 5–15)
BUN: 14 mg/dL (ref 6–20)
CO2: 24 mmol/L (ref 22–32)
Calcium: 8.1 mg/dL — ABNORMAL LOW (ref 8.9–10.3)
Chloride: 109 mmol/L (ref 101–111)
Creatinine, Ser: 1.95 mg/dL — ABNORMAL HIGH (ref 0.61–1.24)
GFR calc Af Amer: 38 mL/min — ABNORMAL LOW (ref >60)
GFR calc non Af Amer: 33 mL/min — ABNORMAL LOW (ref >60)
Glucose, Bld: 102 mg/dL — ABNORMAL HIGH (ref 65–99)
Potassium: 4 mmol/L (ref 3.5–5.1)
Sodium: 136 mmol/L (ref 135–145)

## 2017-03-13 MED ORDER — TAMSULOSIN HCL 0.4 MG PO CAPS: 0.4000 mg | ORAL_CAPSULE | Freq: Every day | ORAL | 1 refills | Status: DC

## 2017-03-13 MED ORDER — DOCUSATE SODIUM 100 MG PO CAPS: 100.0000 mg | ORAL_CAPSULE | Freq: Two times a day (BID) | ORAL | 1 refills | Status: DC

## 2017-03-13 MED ORDER — FUROSEMIDE 20 MG PO TABS: 20.0000 mg | ORAL_TABLET | Freq: Every day | ORAL | 1 refills | Status: DC

## 2017-03-13 NOTE — Discharge Summary (Signed)
Physician Discharge Summary  Marcus Hatfield XBM:[id number] DOB: 02-Oct-1946 DOA: 03/11/2017  PCP: Tivis Ringer, MD  Admit date: 03/11/2017 Discharge date: 03/13/2017  Time spent: 35 minutes  Recommendations for Outpatient Follow-up:  1. Reassess BP and adjust antihypertensive regimen as needed  2. Repeat BMET to follow electrolytes and renal function    Discharge Diagnoses:  Hyponatremia Hypokalemia Acute urinary retention HTN (hypertension) Acute on CKD (chronic kidney disease) stage 3, GFR 30-59 ml/min MM and renal cell carcinoma BPH (with recent prostate biopsy)   Discharge Condition: stable and improved. Discharge home with foley catheter in place and instructions to follow up with PCP and urologist as an outpatient.  Diet recommendation: heart healthy diet   Filed Weights   03/11/17 1819  Weight: 98.4 kg (217 lb)    History of present illness:  71 y.o.malewith medical history significant of renal cell CA, solitary kidney after resection, MM, CKD stage 3 and prostate biopsy on Thursday. Patient with N/V onset earlier today just PTA. Urinary retention and lower abd pain.  Found to have severe hyponatremia, acute on chronic renal failure, hypokalemia and with urinary retention (2.5L of urine).  Hospital Course:  1-hyponatremia: -most likely associated with urinary retention, dehydration and continue use of HCTZ -improved/resolved with IVF's, stopping diuretics and relieving urinary retention -will recommend BMET at follow up visit to reassess trend   2-acute on chronic renal failure: stage 3 at baseline  -due to urinary retention, continue use of nephrotoxic agents and dehydration. -improved with gentle hydration and holding nephrotoxic agents -at discharge essentially back to baseline  -will keep foley in place  -follow renal function trend at follow up visit -will resume avapro and exchange HCTZ for lasix at lower dose  3-acute urinary retention -most  likely associated with BPH -started on Flomax -continue to keep foley in place until follow up with urologist as an outpatient -patient had recent prostate biopsy   4-HTN -continue norvasc and low sodium diet -will also resume his avapro and start low dose of lasix. -reassess BP at follow up visit for further adjustment on his antihypertensive regimen as needed.  5-hypokalemia -electrolytes repleted and WNL at discharge  -repeat BMET at follow up visit to check electrolytes trend   6-MM and renal cell carcinoma -continue outpatient follow up with hem/onc -patient on chemotherapy and receiving cytoxan   Procedures: None    Consultations:  None   Discharge Exam: Vitals:   03/12/17 2127 03/13/17 0545  BP: 138/80 121/78  Pulse: 90 85  Resp: 16 20  Temp: 100.1 F (37.8 C) 98.3 F (36.8 C)    General:  Afebrile, AAOX3; feeling much better overall. Denies CP, SOB and also endorses no further lightheadedness/woozy sensation or any distress.  Cardiovascular: S1 and S2, no rubs, no gallops  Respiratory: good air movement, no wheezing, no crackels  Abdomen: soft, no tenderness, positive BS  Musculoskeletal: no cyanosis or clubbing    Discharge Instructions   Discharge Instructions    Discharge instructions    Complete by:  As directed    Take medications as prescribed Keep yourself well hydrated Follow a heart healthy diet  Arrange follow up with urology service in 1 week Please arrange follow up with PCP in 10 days Increase fiber in your diet  Ok to use stool softeners as needed (avoid diarrhea; change the dose to every other day if needed)     Current Discharge Medication List    START taking these medications   Details  docusate sodium (  COLACE) 100 MG capsule Take 1 capsule (100 mg total) by mouth 2 (two) times daily. Qty: 60 capsule, Refills: 1    furosemide (LASIX) 20 MG tablet Take 1 tablet (20 mg total) by mouth daily. Qty: 30 tablet, Refills: 1     tamsulosin (FLOMAX) 0.4 MG CAPS capsule Take 1 capsule (0.4 mg total) by mouth daily after supper. Qty: 30 capsule, Refills: 1      CONTINUE these medications which have NOT CHANGED   Details  acyclovir (ZOVIRAX) 400 MG tablet Take 400 mg by mouth daily.    albuterol (PROVENTIL HFA;VENTOLIN HFA) 108 (90 BASE) MCG/ACT inhaler Inhale 1-2 puffs into the lungs every 6 (six) hours as needed for wheezing. Qty: 1 Inhaler, Refills: 0    amLODipine (NORVASC) 10 MG tablet Take 10 mg by mouth daily.     aspirin EC 81 MG tablet Take 81 mg by mouth daily.    cyclophosphamide (CYTOXAN) 50 MG capsule Take 600 mg by mouth every 7 (seven) days. Take days 1, 8, and 15 of 28 day cycle on 1st day of IV chemo on weeks with IV chemo    irbesartan (AVAPRO) 300 MG tablet Take 300 mg by mouth daily.     mometasone (NASONEX) 50 MCG/ACT nasal spray Place 2 sprays into the nose daily.    Multiple Vitamins-Minerals (MULTIVITAMIN WITH MINERALS) tablet Take 1 tablet by mouth daily.    polyethylene glycol powder (GLYCOLAX/MIRALAX) powder Take 17 g by mouth daily as needed for constipation.    pseudoephedrine-guaifenesin (MUCINEX D) 60-600 MG 12 hr tablet Take 1 tablet by mouth every 12 (twelve) hours as needed for congestion.     lactulose (CHRONULAC) 10 GM/15ML solution Take 30 mLs by mouth daily as needed for constipation. Refills: 5      STOP taking these medications     hydrochlorothiazide (HYDRODIURIL) 25 MG tablet        Allergies  Allergen Reactions  . Penicillins Hives    Has patient had a PCN reaction causing immediate rash, facial/tongue/throat swelling, SOB or lightheadedness with hypotension:Yes Has patient had a PCN reaction causing severe rash involving mucus membranes or skin necrosis: Yes Has patient had a PCN reaction that required hospitalization No Has patient had a PCN reaction occurring within the last 10 years: No If all of the above answers are "NO", then may proceed with  Cephalosporin use.   . Latex Rash   Follow-up Information    AVVA,RAVISANKAR R, MD Follow up in 10 day(s).   Specialty:  Internal Medicine Contact information: [place] [address] [phone]           The results of significant diagnostics from this hospitalization (including imaging, microbiology, ancillary and laboratory) are listed below for reference.    Labs: Basic Metabolic Panel:  Recent Labs Lab 03/11/17 1905 03/12/17 0415 03/13/17 0442  NA 121* 128* 136  K 3.0* 3.3* 4.0  CL 87* 96* 109  CO2 21* 23 24  GLUCOSE 138* 94 102*  BUN 15 16 14   CREATININE 1.98* 1.85* 1.95*  CALCIUM 8.1* 8.2* 8.1*   CBC:  Recent Labs Lab 03/11/17 1905  WBC 6.3  NEUTROABS 4.8  HGB 9.5*  HCT 27.0*  MCV 88.8  PLT 242    Signed:  Barton Dubois MD.  [facility] 03/13/2017, 10:55 AM

## 2017-03-13 NOTE — Progress Notes (Signed)
RN educated patient about new medications including lasix, flomax, and colace.   RN also educated patient and family about leg bag, and patient was switched to leg bag for discharge.   RN reviewed discharge packet with patient and family. All questions answered.  Paperwork and prescriptions given.  NT rolled patient down with all belongings to family car.

## 2017-03-16 DIAGNOSIS — I10 Essential (primary) hypertension: Secondary | ICD-10-CM | POA: Diagnosis not present

## 2017-04-10 DIAGNOSIS — C649 Malignant neoplasm of unspecified kidney, except renal pelvis: Secondary | ICD-10-CM | POA: Diagnosis not present

## 2017-04-10 DIAGNOSIS — M8580 Other specified disorders of bone density and structure, unspecified site: Secondary | ICD-10-CM | POA: Diagnosis not present

## 2017-04-10 DIAGNOSIS — C9000 Multiple myeloma not having achieved remission: Secondary | ICD-10-CM | POA: Diagnosis not present

## 2017-04-10 DIAGNOSIS — Z880 Allergy status to penicillin: Secondary | ICD-10-CM | POA: Diagnosis not present

## 2017-04-10 DIAGNOSIS — Z905 Acquired absence of kidney: Secondary | ICD-10-CM | POA: Diagnosis not present

## 2017-04-10 DIAGNOSIS — C642 Malignant neoplasm of left kidney, except renal pelvis: Secondary | ICD-10-CM | POA: Diagnosis not present

## 2017-04-10 DIAGNOSIS — N19 Unspecified kidney failure: Secondary | ICD-10-CM | POA: Diagnosis not present

## 2017-04-10 DIAGNOSIS — Z79899 Other long term (current) drug therapy: Secondary | ICD-10-CM | POA: Diagnosis not present

## 2017-04-10 DIAGNOSIS — R972 Elevated prostate specific antigen [PSA]: Secondary | ICD-10-CM | POA: Diagnosis not present

## 2017-04-10 DIAGNOSIS — Z7982 Long term (current) use of aspirin: Secondary | ICD-10-CM | POA: Diagnosis not present

## 2017-04-10 DIAGNOSIS — I10 Essential (primary) hypertension: Secondary | ICD-10-CM | POA: Diagnosis not present

## 2017-04-12 DIAGNOSIS — C641 Malignant neoplasm of right kidney, except renal pelvis: Secondary | ICD-10-CM | POA: Diagnosis not present

## 2017-04-12 DIAGNOSIS — N289 Disorder of kidney and ureter, unspecified: Secondary | ICD-10-CM | POA: Diagnosis not present

## 2017-04-12 DIAGNOSIS — R339 Retention of urine, unspecified: Secondary | ICD-10-CM | POA: Diagnosis not present

## 2017-04-12 DIAGNOSIS — N183 Chronic kidney disease, stage 3 (moderate): Secondary | ICD-10-CM | POA: Diagnosis not present

## 2017-04-12 DIAGNOSIS — C9000 Multiple myeloma not having achieved remission: Secondary | ICD-10-CM | POA: Diagnosis not present

## 2017-04-12 DIAGNOSIS — E876 Hypokalemia: Secondary | ICD-10-CM | POA: Diagnosis not present

## 2017-04-12 DIAGNOSIS — Z6828 Body mass index (BMI) 28.0-28.9, adult: Secondary | ICD-10-CM | POA: Diagnosis not present

## 2017-04-12 DIAGNOSIS — R9721 Rising PSA following treatment for malignant neoplasm of prostate: Secondary | ICD-10-CM | POA: Diagnosis not present

## 2017-04-12 DIAGNOSIS — E871 Hypo-osmolality and hyponatremia: Secondary | ICD-10-CM | POA: Diagnosis not present

## 2017-04-17 DIAGNOSIS — Z79899 Other long term (current) drug therapy: Secondary | ICD-10-CM | POA: Diagnosis not present

## 2017-04-17 DIAGNOSIS — C9000 Multiple myeloma not having achieved remission: Secondary | ICD-10-CM | POA: Diagnosis not present

## 2017-04-17 DIAGNOSIS — Z5111 Encounter for antineoplastic chemotherapy: Secondary | ICD-10-CM | POA: Diagnosis not present

## 2017-04-18 DIAGNOSIS — Z5111 Encounter for antineoplastic chemotherapy: Secondary | ICD-10-CM | POA: Diagnosis not present

## 2017-04-18 DIAGNOSIS — Z79899 Other long term (current) drug therapy: Secondary | ICD-10-CM | POA: Diagnosis not present

## 2017-04-18 DIAGNOSIS — C9000 Multiple myeloma not having achieved remission: Secondary | ICD-10-CM | POA: Diagnosis not present

## 2017-04-20 DIAGNOSIS — C9000 Multiple myeloma not having achieved remission: Secondary | ICD-10-CM | POA: Diagnosis not present

## 2017-04-20 DIAGNOSIS — M25551 Pain in right hip: Secondary | ICD-10-CM | POA: Diagnosis not present

## 2017-04-20 DIAGNOSIS — I10 Essential (primary) hypertension: Secondary | ICD-10-CM | POA: Diagnosis not present

## 2017-04-20 DIAGNOSIS — Z6829 Body mass index (BMI) 29.0-29.9, adult: Secondary | ICD-10-CM | POA: Diagnosis not present

## 2017-04-20 DIAGNOSIS — M1990 Unspecified osteoarthritis, unspecified site: Secondary | ICD-10-CM | POA: Diagnosis not present

## 2017-04-20 DIAGNOSIS — M545 Low back pain: Secondary | ICD-10-CM | POA: Diagnosis not present

## 2017-04-24 DIAGNOSIS — C9000 Multiple myeloma not having achieved remission: Secondary | ICD-10-CM | POA: Diagnosis not present

## 2017-04-24 DIAGNOSIS — Z5111 Encounter for antineoplastic chemotherapy: Secondary | ICD-10-CM | POA: Diagnosis not present

## 2017-04-25 DIAGNOSIS — C9000 Multiple myeloma not having achieved remission: Secondary | ICD-10-CM | POA: Diagnosis not present

## 2017-04-25 DIAGNOSIS — Z5111 Encounter for antineoplastic chemotherapy: Secondary | ICD-10-CM | POA: Diagnosis not present

## 2017-05-01 DIAGNOSIS — Z5111 Encounter for antineoplastic chemotherapy: Secondary | ICD-10-CM | POA: Diagnosis not present

## 2017-05-01 DIAGNOSIS — C9000 Multiple myeloma not having achieved remission: Secondary | ICD-10-CM | POA: Diagnosis not present

## 2017-05-01 DIAGNOSIS — R69 Illness, unspecified: Secondary | ICD-10-CM | POA: Diagnosis not present

## 2017-05-02 DIAGNOSIS — C9000 Multiple myeloma not having achieved remission: Secondary | ICD-10-CM | POA: Diagnosis not present

## 2017-05-02 DIAGNOSIS — Z5111 Encounter for antineoplastic chemotherapy: Secondary | ICD-10-CM | POA: Diagnosis not present

## 2017-05-17 DIAGNOSIS — H524 Presbyopia: Secondary | ICD-10-CM | POA: Diagnosis not present

## 2017-05-17 DIAGNOSIS — H2513 Age-related nuclear cataract, bilateral: Secondary | ICD-10-CM | POA: Diagnosis not present

## 2017-05-17 DIAGNOSIS — D3132 Benign neoplasm of left choroid: Secondary | ICD-10-CM | POA: Diagnosis not present

## 2017-05-17 DIAGNOSIS — H5203 Hypermetropia, bilateral: Secondary | ICD-10-CM | POA: Diagnosis not present

## 2017-05-18 DIAGNOSIS — R972 Elevated prostate specific antigen [PSA]: Secondary | ICD-10-CM | POA: Diagnosis not present

## 2017-05-18 DIAGNOSIS — N401 Enlarged prostate with lower urinary tract symptoms: Secondary | ICD-10-CM | POA: Diagnosis not present

## 2017-05-18 DIAGNOSIS — C9000 Multiple myeloma not having achieved remission: Secondary | ICD-10-CM | POA: Diagnosis not present

## 2017-05-18 DIAGNOSIS — Z85528 Personal history of other malignant neoplasm of kidney: Secondary | ICD-10-CM | POA: Diagnosis not present

## 2017-05-22 DIAGNOSIS — C9000 Multiple myeloma not having achieved remission: Secondary | ICD-10-CM | POA: Diagnosis not present

## 2017-05-22 DIAGNOSIS — Z5111 Encounter for antineoplastic chemotherapy: Secondary | ICD-10-CM | POA: Diagnosis not present

## 2017-05-23 DIAGNOSIS — C9000 Multiple myeloma not having achieved remission: Secondary | ICD-10-CM | POA: Diagnosis not present

## 2017-05-23 DIAGNOSIS — Z5111 Encounter for antineoplastic chemotherapy: Secondary | ICD-10-CM | POA: Diagnosis not present

## 2017-05-29 DIAGNOSIS — C9000 Multiple myeloma not having achieved remission: Secondary | ICD-10-CM | POA: Diagnosis not present

## 2017-05-29 DIAGNOSIS — Z5111 Encounter for antineoplastic chemotherapy: Secondary | ICD-10-CM | POA: Diagnosis not present

## 2017-05-29 DIAGNOSIS — R69 Illness, unspecified: Secondary | ICD-10-CM | POA: Diagnosis not present

## 2017-05-30 DIAGNOSIS — C9000 Multiple myeloma not having achieved remission: Secondary | ICD-10-CM | POA: Diagnosis not present

## 2017-05-30 DIAGNOSIS — Z5111 Encounter for antineoplastic chemotherapy: Secondary | ICD-10-CM | POA: Diagnosis not present

## 2017-06-05 DIAGNOSIS — Z5111 Encounter for antineoplastic chemotherapy: Secondary | ICD-10-CM | POA: Diagnosis not present

## 2017-06-05 DIAGNOSIS — C9000 Multiple myeloma not having achieved remission: Secondary | ICD-10-CM | POA: Diagnosis not present

## 2017-06-06 DIAGNOSIS — Z5111 Encounter for antineoplastic chemotherapy: Secondary | ICD-10-CM | POA: Diagnosis not present

## 2017-06-06 DIAGNOSIS — C9000 Multiple myeloma not having achieved remission: Secondary | ICD-10-CM | POA: Diagnosis not present

## 2017-06-19 DIAGNOSIS — C9000 Multiple myeloma not having achieved remission: Secondary | ICD-10-CM | POA: Diagnosis not present

## 2017-06-26 DIAGNOSIS — Z5111 Encounter for antineoplastic chemotherapy: Secondary | ICD-10-CM | POA: Diagnosis not present

## 2017-06-26 DIAGNOSIS — R69 Illness, unspecified: Secondary | ICD-10-CM | POA: Diagnosis not present

## 2017-06-26 DIAGNOSIS — C9000 Multiple myeloma not having achieved remission: Secondary | ICD-10-CM | POA: Diagnosis not present

## 2017-06-27 DIAGNOSIS — C9000 Multiple myeloma not having achieved remission: Secondary | ICD-10-CM | POA: Diagnosis not present

## 2017-06-27 DIAGNOSIS — Z5111 Encounter for antineoplastic chemotherapy: Secondary | ICD-10-CM | POA: Diagnosis not present

## 2017-07-10 DIAGNOSIS — Z5111 Encounter for antineoplastic chemotherapy: Secondary | ICD-10-CM | POA: Diagnosis not present

## 2017-07-10 DIAGNOSIS — C9000 Multiple myeloma not having achieved remission: Secondary | ICD-10-CM | POA: Diagnosis not present

## 2017-07-11 DIAGNOSIS — C9000 Multiple myeloma not having achieved remission: Secondary | ICD-10-CM | POA: Diagnosis not present

## 2017-07-24 DIAGNOSIS — Z5111 Encounter for antineoplastic chemotherapy: Secondary | ICD-10-CM | POA: Diagnosis not present

## 2017-07-24 DIAGNOSIS — Z79899 Other long term (current) drug therapy: Secondary | ICD-10-CM | POA: Diagnosis not present

## 2017-07-24 DIAGNOSIS — C9000 Multiple myeloma not having achieved remission: Secondary | ICD-10-CM | POA: Diagnosis not present

## 2017-07-25 DIAGNOSIS — C9000 Multiple myeloma not having achieved remission: Secondary | ICD-10-CM | POA: Diagnosis not present

## 2017-07-25 DIAGNOSIS — Z5111 Encounter for antineoplastic chemotherapy: Secondary | ICD-10-CM | POA: Diagnosis not present

## 2017-07-25 DIAGNOSIS — Z79899 Other long term (current) drug therapy: Secondary | ICD-10-CM | POA: Diagnosis not present

## 2017-08-07 DIAGNOSIS — Z79899 Other long term (current) drug therapy: Secondary | ICD-10-CM | POA: Diagnosis not present

## 2017-08-07 DIAGNOSIS — Z5111 Encounter for antineoplastic chemotherapy: Secondary | ICD-10-CM | POA: Diagnosis not present

## 2017-08-07 DIAGNOSIS — C9000 Multiple myeloma not having achieved remission: Secondary | ICD-10-CM | POA: Diagnosis not present

## 2017-08-08 DIAGNOSIS — C9000 Multiple myeloma not having achieved remission: Secondary | ICD-10-CM | POA: Diagnosis not present

## 2017-08-08 DIAGNOSIS — Z79899 Other long term (current) drug therapy: Secondary | ICD-10-CM | POA: Diagnosis not present

## 2017-08-08 DIAGNOSIS — Z5111 Encounter for antineoplastic chemotherapy: Secondary | ICD-10-CM | POA: Diagnosis not present

## 2017-08-11 DIAGNOSIS — R69 Illness, unspecified: Secondary | ICD-10-CM | POA: Diagnosis not present

## 2017-08-12 DIAGNOSIS — R69 Illness, unspecified: Secondary | ICD-10-CM | POA: Diagnosis not present

## 2017-08-17 DIAGNOSIS — I10 Essential (primary) hypertension: Secondary | ICD-10-CM | POA: Diagnosis not present

## 2017-08-17 DIAGNOSIS — Z125 Encounter for screening for malignant neoplasm of prostate: Secondary | ICD-10-CM | POA: Diagnosis not present

## 2017-08-17 DIAGNOSIS — N183 Chronic kidney disease, stage 3 (moderate): Secondary | ICD-10-CM | POA: Diagnosis not present

## 2017-08-17 DIAGNOSIS — R8299 Other abnormal findings in urine: Secondary | ICD-10-CM | POA: Diagnosis not present

## 2017-08-22 DIAGNOSIS — C9000 Multiple myeloma not having achieved remission: Secondary | ICD-10-CM | POA: Diagnosis not present

## 2017-08-22 DIAGNOSIS — Z79899 Other long term (current) drug therapy: Secondary | ICD-10-CM | POA: Diagnosis not present

## 2017-08-22 DIAGNOSIS — Z5111 Encounter for antineoplastic chemotherapy: Secondary | ICD-10-CM | POA: Diagnosis not present

## 2017-08-23 DIAGNOSIS — Z5111 Encounter for antineoplastic chemotherapy: Secondary | ICD-10-CM | POA: Diagnosis not present

## 2017-08-23 DIAGNOSIS — C9000 Multiple myeloma not having achieved remission: Secondary | ICD-10-CM | POA: Diagnosis not present

## 2017-08-28 DIAGNOSIS — D631 Anemia in chronic kidney disease: Secondary | ICD-10-CM | POA: Diagnosis not present

## 2017-08-28 DIAGNOSIS — N183 Chronic kidney disease, stage 3 (moderate): Secondary | ICD-10-CM | POA: Diagnosis not present

## 2017-08-28 DIAGNOSIS — C9000 Multiple myeloma not having achieved remission: Secondary | ICD-10-CM | POA: Diagnosis not present

## 2017-08-28 DIAGNOSIS — M1990 Unspecified osteoarthritis, unspecified site: Secondary | ICD-10-CM | POA: Diagnosis not present

## 2017-08-28 DIAGNOSIS — I10 Essential (primary) hypertension: Secondary | ICD-10-CM | POA: Diagnosis not present

## 2017-08-28 DIAGNOSIS — C641 Malignant neoplasm of right kidney, except renal pelvis: Secondary | ICD-10-CM | POA: Diagnosis not present

## 2017-08-28 DIAGNOSIS — Z905 Acquired absence of kidney: Secondary | ICD-10-CM | POA: Diagnosis not present

## 2017-08-28 DIAGNOSIS — Z0000 Encounter for general adult medical examination without abnormal findings: Secondary | ICD-10-CM | POA: Diagnosis not present

## 2017-08-28 DIAGNOSIS — R972 Elevated prostate specific antigen [PSA]: Secondary | ICD-10-CM | POA: Diagnosis not present

## 2017-08-28 DIAGNOSIS — Z6828 Body mass index (BMI) 28.0-28.9, adult: Secondary | ICD-10-CM | POA: Diagnosis not present

## 2017-08-31 DIAGNOSIS — Z1212 Encounter for screening for malignant neoplasm of rectum: Secondary | ICD-10-CM | POA: Diagnosis not present

## 2017-09-04 DIAGNOSIS — Z79899 Other long term (current) drug therapy: Secondary | ICD-10-CM | POA: Diagnosis not present

## 2017-09-04 DIAGNOSIS — Z5111 Encounter for antineoplastic chemotherapy: Secondary | ICD-10-CM | POA: Diagnosis not present

## 2017-09-04 DIAGNOSIS — C9000 Multiple myeloma not having achieved remission: Secondary | ICD-10-CM | POA: Diagnosis not present

## 2017-09-05 DIAGNOSIS — C9000 Multiple myeloma not having achieved remission: Secondary | ICD-10-CM | POA: Diagnosis not present

## 2017-09-05 DIAGNOSIS — Z5111 Encounter for antineoplastic chemotherapy: Secondary | ICD-10-CM | POA: Diagnosis not present

## 2017-09-05 DIAGNOSIS — Z79899 Other long term (current) drug therapy: Secondary | ICD-10-CM | POA: Diagnosis not present

## 2017-09-12 DIAGNOSIS — R69 Illness, unspecified: Secondary | ICD-10-CM | POA: Diagnosis not present

## 2017-09-18 DIAGNOSIS — C9000 Multiple myeloma not having achieved remission: Secondary | ICD-10-CM | POA: Diagnosis not present

## 2017-09-19 DIAGNOSIS — Z79899 Other long term (current) drug therapy: Secondary | ICD-10-CM | POA: Diagnosis not present

## 2017-09-19 DIAGNOSIS — C9000 Multiple myeloma not having achieved remission: Secondary | ICD-10-CM | POA: Diagnosis not present

## 2017-09-19 DIAGNOSIS — Z5111 Encounter for antineoplastic chemotherapy: Secondary | ICD-10-CM | POA: Diagnosis not present

## 2017-10-02 DIAGNOSIS — C649 Malignant neoplasm of unspecified kidney, except renal pelvis: Secondary | ICD-10-CM | POA: Diagnosis not present

## 2017-10-02 DIAGNOSIS — C9000 Multiple myeloma not having achieved remission: Secondary | ICD-10-CM | POA: Diagnosis not present

## 2017-10-02 DIAGNOSIS — N19 Unspecified kidney failure: Secondary | ICD-10-CM | POA: Diagnosis not present

## 2017-10-02 DIAGNOSIS — M8580 Other specified disorders of bone density and structure, unspecified site: Secondary | ICD-10-CM | POA: Diagnosis not present

## 2017-10-02 DIAGNOSIS — R972 Elevated prostate specific antigen [PSA]: Secondary | ICD-10-CM | POA: Diagnosis not present

## 2017-10-03 DIAGNOSIS — Z5111 Encounter for antineoplastic chemotherapy: Secondary | ICD-10-CM | POA: Diagnosis not present

## 2017-10-03 DIAGNOSIS — C9000 Multiple myeloma not having achieved remission: Secondary | ICD-10-CM | POA: Diagnosis not present

## 2017-10-03 DIAGNOSIS — Z79899 Other long term (current) drug therapy: Secondary | ICD-10-CM | POA: Diagnosis not present

## 2017-10-04 DIAGNOSIS — Z5111 Encounter for antineoplastic chemotherapy: Secondary | ICD-10-CM | POA: Diagnosis not present

## 2017-10-04 DIAGNOSIS — C9000 Multiple myeloma not having achieved remission: Secondary | ICD-10-CM | POA: Diagnosis not present

## 2017-10-04 DIAGNOSIS — Z79899 Other long term (current) drug therapy: Secondary | ICD-10-CM | POA: Diagnosis not present

## 2017-10-11 DIAGNOSIS — R69 Illness, unspecified: Secondary | ICD-10-CM | POA: Diagnosis not present

## 2017-10-11 DIAGNOSIS — L03116 Cellulitis of left lower limb: Secondary | ICD-10-CM | POA: Diagnosis not present

## 2017-10-11 DIAGNOSIS — R293 Abnormal posture: Secondary | ICD-10-CM | POA: Diagnosis not present

## 2017-10-16 DIAGNOSIS — C9000 Multiple myeloma not having achieved remission: Secondary | ICD-10-CM | POA: Diagnosis not present

## 2017-10-16 DIAGNOSIS — Z5111 Encounter for antineoplastic chemotherapy: Secondary | ICD-10-CM | POA: Diagnosis not present

## 2017-10-17 DIAGNOSIS — Z5111 Encounter for antineoplastic chemotherapy: Secondary | ICD-10-CM | POA: Diagnosis not present

## 2017-10-17 DIAGNOSIS — Z79899 Other long term (current) drug therapy: Secondary | ICD-10-CM | POA: Diagnosis not present

## 2017-10-17 DIAGNOSIS — C9000 Multiple myeloma not having achieved remission: Secondary | ICD-10-CM | POA: Diagnosis not present

## 2017-10-30 DIAGNOSIS — M545 Low back pain: Secondary | ICD-10-CM | POA: Diagnosis not present

## 2017-10-30 DIAGNOSIS — Z6828 Body mass index (BMI) 28.0-28.9, adult: Secondary | ICD-10-CM | POA: Diagnosis not present

## 2017-11-17 DIAGNOSIS — Z85528 Personal history of other malignant neoplasm of kidney: Secondary | ICD-10-CM | POA: Diagnosis not present

## 2017-11-30 DIAGNOSIS — H25043 Posterior subcapsular polar age-related cataract, bilateral: Secondary | ICD-10-CM | POA: Diagnosis not present

## 2017-11-30 DIAGNOSIS — H2513 Age-related nuclear cataract, bilateral: Secondary | ICD-10-CM | POA: Diagnosis not present

## 2017-12-04 DIAGNOSIS — C9000 Multiple myeloma not having achieved remission: Secondary | ICD-10-CM | POA: Diagnosis not present

## 2018-01-04 DIAGNOSIS — H538 Other visual disturbances: Secondary | ICD-10-CM | POA: Diagnosis not present

## 2018-01-04 DIAGNOSIS — I10 Essential (primary) hypertension: Secondary | ICD-10-CM | POA: Diagnosis not present

## 2018-01-04 DIAGNOSIS — Z6828 Body mass index (BMI) 28.0-28.9, adult: Secondary | ICD-10-CM | POA: Diagnosis not present

## 2018-01-30 DIAGNOSIS — H25041 Posterior subcapsular polar age-related cataract, right eye: Secondary | ICD-10-CM | POA: Diagnosis not present

## 2018-01-30 DIAGNOSIS — H2511 Age-related nuclear cataract, right eye: Secondary | ICD-10-CM | POA: Diagnosis not present

## 2018-01-30 DIAGNOSIS — H25811 Combined forms of age-related cataract, right eye: Secondary | ICD-10-CM | POA: Diagnosis not present

## 2018-01-30 HISTORY — PX: EYE SURGERY: SHX253

## 2018-03-12 DIAGNOSIS — C9000 Multiple myeloma not having achieved remission: Secondary | ICD-10-CM | POA: Diagnosis not present

## 2018-03-20 DIAGNOSIS — H25042 Posterior subcapsular polar age-related cataract, left eye: Secondary | ICD-10-CM | POA: Diagnosis not present

## 2018-03-20 DIAGNOSIS — H2512 Age-related nuclear cataract, left eye: Secondary | ICD-10-CM | POA: Diagnosis not present

## 2018-03-20 DIAGNOSIS — H25812 Combined forms of age-related cataract, left eye: Secondary | ICD-10-CM | POA: Diagnosis not present

## 2018-04-13 DIAGNOSIS — R0789 Other chest pain: Secondary | ICD-10-CM | POA: Diagnosis not present

## 2018-04-13 DIAGNOSIS — Z6828 Body mass index (BMI) 28.0-28.9, adult: Secondary | ICD-10-CM | POA: Diagnosis not present

## 2018-04-13 DIAGNOSIS — M7090 Unspecified soft tissue disorder related to use, overuse and pressure of unspecified site: Secondary | ICD-10-CM | POA: Diagnosis not present

## 2018-04-17 DIAGNOSIS — R319 Hematuria, unspecified: Secondary | ICD-10-CM | POA: Diagnosis not present

## 2018-04-17 DIAGNOSIS — N390 Urinary tract infection, site not specified: Secondary | ICD-10-CM | POA: Diagnosis not present

## 2018-04-18 DIAGNOSIS — Z961 Presence of intraocular lens: Secondary | ICD-10-CM | POA: Diagnosis not present

## 2018-04-25 DIAGNOSIS — C9000 Multiple myeloma not having achieved remission: Secondary | ICD-10-CM | POA: Diagnosis not present

## 2018-07-16 DIAGNOSIS — C9000 Multiple myeloma not having achieved remission: Secondary | ICD-10-CM | POA: Diagnosis not present

## 2018-08-11 DIAGNOSIS — R69 Illness, unspecified: Secondary | ICD-10-CM | POA: Diagnosis not present

## 2018-09-17 DIAGNOSIS — C9000 Multiple myeloma not having achieved remission: Secondary | ICD-10-CM | POA: Diagnosis not present

## 2018-09-26 DIAGNOSIS — I10 Essential (primary) hypertension: Secondary | ICD-10-CM | POA: Diagnosis not present

## 2018-09-26 DIAGNOSIS — Z125 Encounter for screening for malignant neoplasm of prostate: Secondary | ICD-10-CM | POA: Diagnosis not present

## 2018-09-26 DIAGNOSIS — Z0000 Encounter for general adult medical examination without abnormal findings: Secondary | ICD-10-CM | POA: Diagnosis not present

## 2018-09-26 DIAGNOSIS — Z2089 Contact with and (suspected) exposure to other communicable diseases: Secondary | ICD-10-CM | POA: Diagnosis not present

## 2018-09-26 DIAGNOSIS — M859 Disorder of bone density and structure, unspecified: Secondary | ICD-10-CM | POA: Diagnosis not present

## 2018-09-26 DIAGNOSIS — R82998 Other abnormal findings in urine: Secondary | ICD-10-CM | POA: Diagnosis not present

## 2018-10-04 DIAGNOSIS — I4510 Unspecified right bundle-branch block: Secondary | ICD-10-CM | POA: Diagnosis not present

## 2018-10-04 DIAGNOSIS — C9000 Multiple myeloma not having achieved remission: Secondary | ICD-10-CM | POA: Diagnosis not present

## 2018-10-04 DIAGNOSIS — M1990 Unspecified osteoarthritis, unspecified site: Secondary | ICD-10-CM | POA: Diagnosis not present

## 2018-10-04 DIAGNOSIS — Z905 Acquired absence of kidney: Secondary | ICD-10-CM | POA: Diagnosis not present

## 2018-10-04 DIAGNOSIS — I10 Essential (primary) hypertension: Secondary | ICD-10-CM | POA: Diagnosis not present

## 2018-10-04 DIAGNOSIS — M859 Disorder of bone density and structure, unspecified: Secondary | ICD-10-CM | POA: Diagnosis not present

## 2018-10-04 DIAGNOSIS — D631 Anemia in chronic kidney disease: Secondary | ICD-10-CM | POA: Diagnosis not present

## 2018-10-04 DIAGNOSIS — Z0000 Encounter for general adult medical examination without abnormal findings: Secondary | ICD-10-CM | POA: Diagnosis not present

## 2018-10-04 DIAGNOSIS — Z1212 Encounter for screening for malignant neoplasm of rectum: Secondary | ICD-10-CM | POA: Diagnosis not present

## 2018-10-04 DIAGNOSIS — C641 Malignant neoplasm of right kidney, except renal pelvis: Secondary | ICD-10-CM | POA: Diagnosis not present

## 2018-10-04 DIAGNOSIS — N183 Chronic kidney disease, stage 3 (moderate): Secondary | ICD-10-CM | POA: Diagnosis not present

## 2018-10-15 DIAGNOSIS — C9000 Multiple myeloma not having achieved remission: Secondary | ICD-10-CM | POA: Diagnosis not present

## 2018-11-29 DIAGNOSIS — K5900 Constipation, unspecified: Secondary | ICD-10-CM | POA: Diagnosis not present

## 2018-11-29 DIAGNOSIS — R109 Unspecified abdominal pain: Secondary | ICD-10-CM | POA: Diagnosis not present

## 2018-11-29 DIAGNOSIS — Z6828 Body mass index (BMI) 28.0-28.9, adult: Secondary | ICD-10-CM | POA: Diagnosis not present

## 2019-01-10 DIAGNOSIS — N281 Cyst of kidney, acquired: Secondary | ICD-10-CM | POA: Diagnosis not present

## 2019-01-10 DIAGNOSIS — R972 Elevated prostate specific antigen [PSA]: Secondary | ICD-10-CM | POA: Diagnosis not present

## 2019-01-10 DIAGNOSIS — C642 Malignant neoplasm of left kidney, except renal pelvis: Secondary | ICD-10-CM | POA: Diagnosis not present

## 2019-01-10 DIAGNOSIS — N390 Urinary tract infection, site not specified: Secondary | ICD-10-CM | POA: Diagnosis not present

## 2019-01-10 DIAGNOSIS — R310 Gross hematuria: Secondary | ICD-10-CM | POA: Diagnosis not present

## 2019-02-11 DIAGNOSIS — C9000 Multiple myeloma not having achieved remission: Secondary | ICD-10-CM | POA: Diagnosis not present

## 2019-02-18 DIAGNOSIS — Z13820 Encounter for screening for osteoporosis: Secondary | ICD-10-CM | POA: Diagnosis not present

## 2019-02-18 DIAGNOSIS — C9000 Multiple myeloma not having achieved remission: Secondary | ICD-10-CM | POA: Diagnosis not present

## 2019-02-18 DIAGNOSIS — M85852 Other specified disorders of bone density and structure, left thigh: Secondary | ICD-10-CM | POA: Diagnosis not present

## 2019-02-18 DIAGNOSIS — M8589 Other specified disorders of bone density and structure, multiple sites: Secondary | ICD-10-CM | POA: Diagnosis not present

## 2019-04-03 DIAGNOSIS — N183 Chronic kidney disease, stage 3 (moderate): Secondary | ICD-10-CM | POA: Diagnosis not present

## 2019-04-03 DIAGNOSIS — M1990 Unspecified osteoarthritis, unspecified site: Secondary | ICD-10-CM | POA: Diagnosis not present

## 2019-04-03 DIAGNOSIS — K5900 Constipation, unspecified: Secondary | ICD-10-CM | POA: Diagnosis not present

## 2019-04-03 DIAGNOSIS — C9000 Multiple myeloma not having achieved remission: Secondary | ICD-10-CM | POA: Diagnosis not present

## 2019-04-03 DIAGNOSIS — C641 Malignant neoplasm of right kidney, except renal pelvis: Secondary | ICD-10-CM | POA: Diagnosis not present

## 2019-04-03 DIAGNOSIS — I129 Hypertensive chronic kidney disease with stage 1 through stage 4 chronic kidney disease, or unspecified chronic kidney disease: Secondary | ICD-10-CM | POA: Diagnosis not present

## 2019-04-03 DIAGNOSIS — M8580 Other specified disorders of bone density and structure, unspecified site: Secondary | ICD-10-CM | POA: Diagnosis not present

## 2019-04-22 DIAGNOSIS — H524 Presbyopia: Secondary | ICD-10-CM | POA: Diagnosis not present

## 2019-04-22 DIAGNOSIS — D3132 Benign neoplasm of left choroid: Secondary | ICD-10-CM | POA: Diagnosis not present

## 2019-04-22 DIAGNOSIS — Z961 Presence of intraocular lens: Secondary | ICD-10-CM | POA: Diagnosis not present

## 2019-06-11 DIAGNOSIS — D649 Anemia, unspecified: Secondary | ICD-10-CM | POA: Diagnosis not present

## 2019-06-11 DIAGNOSIS — I129 Hypertensive chronic kidney disease with stage 1 through stage 4 chronic kidney disease, or unspecified chronic kidney disease: Secondary | ICD-10-CM | POA: Diagnosis not present

## 2019-06-11 DIAGNOSIS — Z905 Acquired absence of kidney: Secondary | ICD-10-CM | POA: Diagnosis not present

## 2019-06-11 DIAGNOSIS — C642 Malignant neoplasm of left kidney, except renal pelvis: Secondary | ICD-10-CM | POA: Diagnosis not present

## 2019-06-11 DIAGNOSIS — N189 Chronic kidney disease, unspecified: Secondary | ICD-10-CM | POA: Diagnosis not present

## 2019-06-11 DIAGNOSIS — C9000 Multiple myeloma not having achieved remission: Secondary | ICD-10-CM | POA: Diagnosis not present

## 2019-06-11 DIAGNOSIS — Z7983 Long term (current) use of bisphosphonates: Secondary | ICD-10-CM | POA: Diagnosis not present

## 2019-06-11 DIAGNOSIS — R972 Elevated prostate specific antigen [PSA]: Secondary | ICD-10-CM | POA: Diagnosis not present

## 2019-06-11 DIAGNOSIS — I10 Essential (primary) hypertension: Secondary | ICD-10-CM | POA: Diagnosis not present

## 2019-09-14 DIAGNOSIS — R69 Illness, unspecified: Secondary | ICD-10-CM | POA: Diagnosis not present

## 2019-10-07 DIAGNOSIS — C641 Malignant neoplasm of right kidney, except renal pelvis: Secondary | ICD-10-CM | POA: Diagnosis not present

## 2019-10-07 DIAGNOSIS — D649 Anemia, unspecified: Secondary | ICD-10-CM | POA: Diagnosis not present

## 2019-10-07 DIAGNOSIS — Z905 Acquired absence of kidney: Secondary | ICD-10-CM | POA: Diagnosis not present

## 2019-10-07 DIAGNOSIS — I129 Hypertensive chronic kidney disease with stage 1 through stage 4 chronic kidney disease, or unspecified chronic kidney disease: Secondary | ICD-10-CM | POA: Diagnosis not present

## 2019-10-07 DIAGNOSIS — Z7983 Long term (current) use of bisphosphonates: Secondary | ICD-10-CM | POA: Diagnosis not present

## 2019-10-07 DIAGNOSIS — C9000 Multiple myeloma not having achieved remission: Secondary | ICD-10-CM | POA: Diagnosis not present

## 2019-10-07 DIAGNOSIS — N189 Chronic kidney disease, unspecified: Secondary | ICD-10-CM | POA: Diagnosis not present

## 2019-11-06 DIAGNOSIS — C9000 Multiple myeloma not having achieved remission: Secondary | ICD-10-CM | POA: Diagnosis not present

## 2019-12-16 DIAGNOSIS — C9000 Multiple myeloma not having achieved remission: Secondary | ICD-10-CM | POA: Diagnosis not present

## 2019-12-16 DIAGNOSIS — Z905 Acquired absence of kidney: Secondary | ICD-10-CM | POA: Diagnosis not present

## 2019-12-16 DIAGNOSIS — C642 Malignant neoplasm of left kidney, except renal pelvis: Secondary | ICD-10-CM | POA: Diagnosis not present

## 2019-12-16 DIAGNOSIS — Z7983 Long term (current) use of bisphosphonates: Secondary | ICD-10-CM | POA: Diagnosis not present

## 2021-01-20 DIAGNOSIS — N2889 Other specified disorders of kidney and ureter: Secondary | ICD-10-CM | POA: Diagnosis not present

## 2021-02-24 DIAGNOSIS — Z905 Acquired absence of kidney: Secondary | ICD-10-CM | POA: Diagnosis not present

## 2021-02-24 DIAGNOSIS — C9000 Multiple myeloma not having achieved remission: Secondary | ICD-10-CM | POA: Diagnosis not present

## 2021-02-24 DIAGNOSIS — N2889 Other specified disorders of kidney and ureter: Secondary | ICD-10-CM | POA: Diagnosis not present

## 2021-02-25 ENCOUNTER — Other Ambulatory Visit: Payer: Self-pay

## 2021-02-25 ENCOUNTER — Emergency Department (HOSPITAL_COMMUNITY): Payer: Medicare HMO

## 2021-02-25 ENCOUNTER — Emergency Department (HOSPITAL_COMMUNITY)
Admission: EM | Admit: 2021-02-25 | Discharge: 2021-02-25 | Disposition: A | Discharge status: Home / Self Care | Payer: Medicare HMO | Attending: Emergency Medicine | Admitting: Emergency Medicine

## 2021-02-25 ENCOUNTER — Encounter (HOSPITAL_COMMUNITY): Payer: Self-pay | Admitting: *Deleted

## 2021-02-25 ENCOUNTER — Encounter (HOSPITAL_COMMUNITY): Payer: Self-pay | Admitting: Emergency Medicine

## 2021-02-25 ENCOUNTER — Ambulatory Visit (HOSPITAL_COMMUNITY)
Admission: EM | Admit: 2021-02-25 | Discharge: 2021-02-25 | Disposition: A | Discharge status: Home / Self Care | Payer: Medicare HMO

## 2021-02-25 DIAGNOSIS — H5711 Ocular pain, right eye: Secondary | ICD-10-CM | POA: Diagnosis not present

## 2021-02-25 DIAGNOSIS — M79644 Pain in right finger(s): Secondary | ICD-10-CM | POA: Insufficient documentation

## 2021-02-25 DIAGNOSIS — I129 Hypertensive chronic kidney disease with stage 1 through stage 4 chronic kidney disease, or unspecified chronic kidney disease: Secondary | ICD-10-CM | POA: Insufficient documentation

## 2021-02-25 DIAGNOSIS — Y9301 Activity, walking, marching and hiking: Secondary | ICD-10-CM | POA: Insufficient documentation

## 2021-02-25 DIAGNOSIS — M25532 Pain in left wrist: Secondary | ICD-10-CM | POA: Diagnosis not present

## 2021-02-25 DIAGNOSIS — S0990XA Unspecified injury of head, initial encounter: Secondary | ICD-10-CM | POA: Diagnosis not present

## 2021-02-25 DIAGNOSIS — Z23 Encounter for immunization: Secondary | ICD-10-CM | POA: Diagnosis not present

## 2021-02-25 DIAGNOSIS — S0511XA Contusion of eyeball and orbital tissues, right eye, initial encounter: Secondary | ICD-10-CM | POA: Diagnosis not present

## 2021-02-25 DIAGNOSIS — W01198A Fall on same level from slipping, tripping and stumbling with subsequent striking against other object, initial encounter: Secondary | ICD-10-CM | POA: Insufficient documentation

## 2021-02-25 DIAGNOSIS — N183 Chronic kidney disease, stage 3 unspecified: Secondary | ICD-10-CM | POA: Diagnosis not present

## 2021-02-25 DIAGNOSIS — Z8249 Family history of ischemic heart disease and other diseases of the circulatory system: Secondary | ICD-10-CM | POA: Insufficient documentation

## 2021-02-25 DIAGNOSIS — Z91040 Latex allergy status: Secondary | ICD-10-CM | POA: Diagnosis not present

## 2021-02-25 DIAGNOSIS — S01111A Laceration without foreign body of right eyelid and periocular area, initial encounter: Secondary | ICD-10-CM | POA: Insufficient documentation

## 2021-02-25 DIAGNOSIS — W19XXXA Unspecified fall, initial encounter: Secondary | ICD-10-CM

## 2021-02-25 DIAGNOSIS — Z880 Allergy status to penicillin: Secondary | ICD-10-CM | POA: Insufficient documentation

## 2021-02-25 DIAGNOSIS — Z7982 Long term (current) use of aspirin: Secondary | ICD-10-CM | POA: Diagnosis not present

## 2021-02-25 DIAGNOSIS — Z79899 Other long term (current) drug therapy: Secondary | ICD-10-CM | POA: Insufficient documentation

## 2021-02-25 DIAGNOSIS — S0101XA Laceration without foreign body of scalp, initial encounter: Secondary | ICD-10-CM | POA: Diagnosis not present

## 2021-02-25 MED ORDER — TETANUS-DIPHTH-ACELL PERTUSSIS 5-2.5-18.5 LF-MCG/0.5 IM SUSY
0.5000 mL | PREFILLED_SYRINGE | Freq: Once | INTRAMUSCULAR | Status: AC
Start: 2021-02-25 — End: 2021-02-25
  Administered 2021-02-25: 0.5 mL via INTRAMUSCULAR

## 2021-02-25 NOTE — Discharge Instructions (Addendum)
Continue taking home medications as prescribed. Use Tylenol as needed for pain. Use ice to help with pain and swelling. The glue will fall off on its own after several days.  It can get wet, but do not scrub at the area. Follow-up with your primary care doctor as needed for recheck of your symptoms. Return to the emergency room if develop severe worsening headache, double vision or visual loss, inability to move your right eye, new numbness, or any new question, or concerning symptoms.

## 2021-02-25 NOTE — ED Notes (Signed)
dermabond at bedside, awaiting MD

## 2021-02-25 NOTE — ED Triage Notes (Signed)
Pt presents with right eye laceration on eye lid, right middle finger swelling, and left wrist pain after fall this morning while on morning walk.

## 2021-02-25 NOTE — ED Notes (Signed)
Patient is being discharged from the Urgent Care and sent to the Emergency Department via POV with spouse . Per Olene Craven, PA, patient is in need of higher level of care due to fall with hitting head on concrete and orbital pain. Patient is aware and verbalizes understanding of plan of care.  Vitals:   02/25/21 0953  BP: (!) 149/90  Pulse: (!) 59  Resp: 17  Temp: 98.1 F (36.7 C)  SpO2: 98%

## 2021-02-25 NOTE — ED Triage Notes (Signed)
States he was walking this morning and loss his balance and fell hitting his face on the pavement, abrasion to his right eye c/o pain left wrist and swelling to right middle finger. No loc. Was seen at [facility] and sent to ed.

## 2021-02-25 NOTE — ED Provider Notes (Signed)
Marcus Hatfield    CSN: [id number] Arrival date & time: 02/25/21  6967      History   Chief Complaint Chief Complaint  Patient presents with  . Fall  . Wrist Pain  . Eye Pain    HPI Reinhardt Licausi is a 75 y.o. male presenting with multiple complaints following fall- R upper eyelid laceration, orbital pain and swelling, R middle finger pain and swelling, L wrist pain with movement, bilateral knee abrasions.  States he was going for his morning walk, when he noticed a bus approaching.  He tried to speed up to get out of its way, but instead tripped and fell, hitting his head on the sidewalk. Denies LOC, drowsiness, dizziness, headaches, vision changes, pain with eye movement. He does not take a blood thinner but does take daily aspirin.  HPI  Past Medical History:  Diagnosis Date  . Arthritis   . CKD (chronic kidney disease) stage 3, GFR 30-59 ml/min (HCC) 20015   Creat 1.9  . Hypertension   . Multiple myeloma ([place]) 2016   [facility]  . Prostate disorder 02/2017    Patient Active Problem List   Diagnosis Date Noted  . Acute urinary retention 03/11/2017  . Hyponatremia 03/11/2017  . HTN (hypertension) 03/11/2017  . CKD (chronic kidney disease) stage 3, GFR 30-59 ml/min (HCC) 03/11/2017  . External hemorrhoid, thrombosed 04/16/2012    Past Surgical History:  Procedure Laterality Date  . KNEE SURGERY     over ten years ago  . NEPHRECTOMY Right 03/2015       Home Medications    Prior to Admission medications   Medication Sig Start Date End Date Taking? Authorizing Provider  acyclovir (ZOVIRAX) 400 MG tablet Take 400 mg by mouth daily. 02/01/17   [provider]  albuterol (PROVENTIL HFA;VENTOLIN HFA) 108 (90 BASE) MCG/ACT inhaler Inhale 1-2 puffs into the lungs every 6 (six) hours as needed for wheezing. 02/12/12 03/11/17  Harden Mo, MD  amLODipine (NORVASC) 10 MG tablet Take 10 mg by mouth daily.  12/28/15   [provider]  aspirin EC  81 MG tablet Take 81 mg by mouth daily. 06/22/16   [provider]  cyclophosphamide (CYTOXAN) 50 MG capsule Take 600 mg by mouth every 7 (seven) days. Take days 1, 8, and 15 of 28 day cycle on 1st day of IV chemo on weeks with IV chemo 01/23/17   [provider]  docusate sodium (COLACE) 100 MG capsule Take 1 capsule (100 mg total) by mouth 2 (two) times daily. 03/13/17   Barton Dubois, MD  furosemide (LASIX) 20 MG tablet Take 1 tablet (20 mg total) by mouth daily. 03/13/17   Barton Dubois, MD  irbesartan (AVAPRO) 300 MG tablet Take 300 mg by mouth daily.  12/28/15   [provider]  lactulose (CHRONULAC) 10 GM/15ML solution Take 30 mLs by mouth daily as needed for constipation. 12/07/16   [provider]  mometasone (NASONEX) 50 MCG/ACT nasal spray Place 2 sprays into the nose daily.    [provider]  Multiple Vitamins-Minerals (MULTIVITAMIN WITH MINERALS) tablet Take 1 tablet by mouth daily.    [provider]  polyethylene glycol powder (GLYCOLAX/MIRALAX) powder Take 17 g by mouth daily as needed for constipation. 03/09/17   [provider]  pseudoephedrine-guaifenesin (MUCINEX D) 60-600 MG 12 hr tablet Take 1 tablet by mouth every 12 (twelve) hours as needed for congestion.     [provider]  tamsulosin [facility])  0.4 MG CAPS capsule Take 1 capsule (0.4 mg total) by mouth daily after supper. 03/13/17   Barton Dubois, MD    Family History Family History  Problem Relation Age of Onset  . Diabetes Father   . Hypertension Brother     Social History Social History   Tobacco Use  . Smoking status: Never Smoker  . Smokeless tobacco: Never Used  Substance Use Topics  . Alcohol use: No  . Drug use: No     Allergies   Penicillins and Latex   Review of Systems Review of Systems  Constitutional: Negative for appetite change, chills, fatigue and fever.  HENT: Negative for congestion, sinus pressure, sore throat, trouble  swallowing and voice change.   Eyes: Negative for photophobia, pain, discharge, redness, itching and visual disturbance.       R orbital pain and eyelid laceration  Respiratory: Negative for cough, chest tightness and shortness of breath.   Cardiovascular: Negative for chest pain, palpitations and leg swelling.  Gastrointestinal: Negative for abdominal pain, constipation, diarrhea, nausea and vomiting.  Genitourinary: Negative for dysuria, flank pain, frequency and urgency.  Musculoskeletal: Negative for back pain, gait problem, myalgias, neck pain and neck stiffness.  Neurological: Negative for dizziness, tremors, seizures, syncope, facial asymmetry, speech difficulty, weakness, light-headedness, numbness and headaches.  Psychiatric/Behavioral: Negative for agitation, decreased concentration, dysphoric mood, hallucinations and suicidal ideas. The patient is not nervous/anxious.   All other systems reviewed and are negative.    Physical Exam Triage Vital Signs ED Triage Vitals  Enc Vitals Group     BP 02/25/21 0953 (!) 149/90     Pulse Rate 02/25/21 0953 (!) 59     Resp 02/25/21 0953 17     Temp 02/25/21 0953 98.1 F (36.7 C)     Temp Source 02/25/21 0953 Oral     SpO2 02/25/21 0953 98 %     Weight --      Height --      Head Circumference --      Peak Flow --      Pain Score 02/25/21 0952 3     Pain Loc --      Pain Edu? --      Excl. in [place]? --    No data found.  Updated Vital Signs BP (!) 149/90 (BP Location: Left Arm)   Pulse (!) 59   Temp 98.1 F (36.7 C) (Oral)   Resp 17   SpO2 98%   Visual Acuity Right Eye Distance:   Left Eye Distance:   Bilateral Distance:    Right Eye Near:   Left Eye Near:    Bilateral Near:     Physical Exam Vitals reviewed.  Constitutional:      Appearance: Normal appearance.  HENT:     Head: Abrasion and right periorbital erythema present.     Nose: Nose normal.     Mouth/Throat:     Mouth: Mucous membranes are moist. Injury  and lacerations present.     Pharynx: Oropharynx is clear. Uvula midline. No posterior oropharyngeal erythema or uvula swelling.     Tonsils: No tonsillar exudate.     Comments: (eye- see image below)   R upper lip swelling with small inner-lip laceration and bruising. Not actively bleeding. No other lesions of oral mucosa or lips. Eyes:     General: Vision grossly intact. No visual field deficit.       Right eye: No foreign body, discharge or hordeolum.  Left eye: No foreign body, discharge or hordeolum.     Extraocular Movements: Extraocular movements intact.     Conjunctiva/sclera: Conjunctivae normal.     Pupils: Pupils are equal, round, and reactive to light.  Cardiovascular:     Rate and Rhythm: Normal rate and regular rhythm.     Pulses:          Radial pulses are 2+ on the right side and 2+ on the left side.     Heart sounds: Normal heart sounds.  Pulmonary:     Effort: Pulmonary effort is normal.     Breath sounds: Normal breath sounds and air entry.  Abdominal:     General: Abdomen is flat.     Tenderness: There is no abdominal tenderness. There is no guarding or rebound. Negative signs include Murphy's sign and Rovsing's sign.  Musculoskeletal:     Cervical back: Normal and full passive range of motion without pain. No deformity, spasms, tenderness or bony tenderness. No pain with movement, spinous process tenderness or muscular tenderness.     Thoracic back: Normal. No deformity, spasms, tenderness or bony tenderness.     Lumbar back: Normal. No deformity, spasms, tenderness or bony tenderness.     Right lower leg: No edema.     Left lower leg: No edema.     Comments: L wrist pain with flexion and extension. No joint tenderness to palpation. No edema ecchymosis or abrasion. Sensation intact. ROM fingers intact without pain. Grip strength 5/5. Radial pulse 2+. Cap refill <2 seconds. No snuffbox tenderness.   R middle finger- PIP with swelling and tenderness to  palpation. ROM intact but with pain. No abrasion or ecchymosis. ROM other fingers intact without pain. Grip strength 5/5. Radial pulse 2+. Cap refill <2 seconds. No snuffbox tenderness.   No other tenderness, deformity, abrasion, ecchymosis.  Skin:    Comments: Few small abrasions on bilateral knees. Clean and not actively bleeding.   Neurological:     General: No focal deficit present.     Mental Status: He is alert and oriented to person, place, and time.     Cranial Nerves: Cranial nerves are intact.     Sensory: Sensation is intact.     Motor: Motor function is intact.     Coordination: Coordination is intact.     Gait: Gait is intact. Gait normal.     Comments: CN 2-12 grossly intact  Psychiatric:        Behavior: Behavior is cooperative.         UC Treatments / Results  Labs (all labs ordered are listed, but only abnormal results are displayed) Labs Reviewed - No data to display  EKG   Radiology No results found.  Procedures Procedures (including critical care time)  Medications Ordered in UC Medications - No data to display  Initial Impression / Assessment and Plan / UC Course  I have reviewed the triage vital signs and the nursing notes.  Pertinent labs & imaging results that were available during my care of the patient were reviewed by me and considered in my medical decision making (see chart for details).     This patient is a 75 year old male presenting with multiple injuries following fall.  He did sustain head trauma during this fall, and now has right orbital tenderness with periorbital edema, and right upper eyelid laceration.  Adamantly denies loss of consciousness, unusual drowsiness, headaches; benign neuro exam today.  He does not take a blood thinner.  Patient  also with left wrist pain and right middle finger pain.  I am sending this patient to [facility] ER for further evaluation and management. To rule out intracranial bleed and orbital  fracture, he will likely require a head CT.  He is currently hemodynamically stable to transport himself to the ER.  This chart was dictated using voice recognition software, Dragon. Despite the best efforts of this provider to proofread and correct errors, errors may still occur which can change documentation meaning.    Final Clinical Impressions(s) / UC Diagnoses   Final diagnoses:  Orbital pain, right  Fall, initial encounter     Discharge Instructions     Head straight to [facility] ED for further evaluation and management.   If you experience dizziness, unusual drowsiness, worst headache of life, chest pain, shortness of breath, etc while on the way to the ED- stop and immediately call 911.    ED Prescriptions    None     PDMP not reviewed this encounter.   Hazel Sams, PA-C 02/25/21 1052

## 2021-02-25 NOTE — ED Notes (Signed)
Ice packs to left wrist and right side of face/eye

## 2021-02-25 NOTE — Discharge Instructions (Addendum)
Head straight to [facility] ED for further evaluation and management.   If you experience dizziness, unusual drowsiness, worst headache of life, chest pain, shortness of breath, etc while on the way to the ED- stop and immediately call 911.

## 2021-02-25 NOTE — ED Provider Notes (Signed)
[facility] Provider Note   CSN: [id number] Arrival date & time: 02/25/21  1045     History Chief Complaint  Patient presents with  . Fall    Marcus Hatfield is a 75 y.o. male presenting for evaluation after fall.  Patient states he was on a walk today when he tripped and fell, fell onto his left wrist and hit his head.  He also fell to both knees.  He has pain in his left wrist, right middle finger, and around his right eye.  He did not lose consciousness.  He does not normally use any assistive devices for walking including walker or cane.  No history of frequent falls.  He takes a baby aspirin daily, no other blood thinners.  He denies chest pain, shortness of breath, dizziness prior to the fall.  He denies current symptoms including headache, neck pain, back pain, chest pain, abdominal pain.  No numbness or tingling.  No nausea or vomiting.  No vision changes.  He has not taken anything for his symptoms.   Additional history obtained from chart review.  Patient with a history of CKD, hypertension, multiple myeloma  HPI     Past Medical History:  Diagnosis Date  . Arthritis   . CKD (chronic kidney disease) stage 3, GFR 30-59 ml/min (HCC) 20015   Creat 1.9  . Hypertension   . Multiple myeloma ([place]) 2016   [facility]  . Prostate disorder 02/2017    Patient Active Problem List   Diagnosis Date Noted  . Acute urinary retention 03/11/2017  . Hyponatremia 03/11/2017  . HTN (hypertension) 03/11/2017  . CKD (chronic kidney disease) stage 3, GFR 30-59 ml/min (HCC) 03/11/2017  . External hemorrhoid, thrombosed 04/16/2012    Past Surgical History:  Procedure Laterality Date  . KNEE SURGERY     over ten years ago  . NEPHRECTOMY Right 03/2015       Family History  Problem Relation Age of Onset  . Diabetes Father   . Hypertension Brother     Social History   Tobacco Use  . Smoking status: Never Smoker  . Smokeless tobacco:  Never Used  Substance Use Topics  . Alcohol use: No  . Drug use: No    Home Medications Prior to Admission medications   Medication Sig Start Date End Date Taking? Authorizing Provider  acyclovir (ZOVIRAX) 400 MG tablet Take 400 mg by mouth daily. 02/01/17   [provider]  albuterol (PROVENTIL HFA;VENTOLIN HFA) 108 (90 BASE) MCG/ACT inhaler Inhale 1-2 puffs into the lungs every 6 (six) hours as needed for wheezing. 02/12/12 03/11/17  Harden Mo, MD  amLODipine (NORVASC) 10 MG tablet Take 10 mg by mouth daily.  12/28/15   [provider]  aspirin EC 81 MG tablet Take 81 mg by mouth daily. 06/22/16   [provider]  cyclophosphamide (CYTOXAN) 50 MG capsule Take 600 mg by mouth every 7 (seven) days. Take days 1, 8, and 15 of 28 day cycle on 1st day of IV chemo on weeks with IV chemo 01/23/17   [provider]  docusate sodium (COLACE) 100 MG capsule Take 1 capsule (100 mg total) by mouth 2 (two) times daily. 03/13/17   Barton Dubois, MD  furosemide (LASIX) 20 MG tablet Take 1 tablet (20 mg total) by mouth daily. 03/13/17   Barton Dubois, MD  irbesartan (AVAPRO) 300 MG tablet Take 300 mg by mouth daily.  12/28/15   [provider]  lactulose (CHRONULAC) 10 GM/15ML solution Take 30 mLs by mouth daily as needed for constipation. 12/07/16   [provider]  mometasone (NASONEX) 50 MCG/ACT nasal spray Place 2 sprays into the nose daily.    [provider]  Multiple Vitamins-Minerals (MULTIVITAMIN WITH MINERALS) tablet Take 1 tablet by mouth daily.    [provider]  polyethylene glycol powder (GLYCOLAX/MIRALAX) powder Take 17 g by mouth daily as needed for constipation. 03/09/17   [provider]  pseudoephedrine-guaifenesin (MUCINEX D) 60-600 MG 12 hr tablet Take 1 tablet by mouth every 12 (twelve) hours as needed for congestion.     [provider]  tamsulosin (FLOMAX) 0.4 MG CAPS capsule Take 1 capsule (0.4 mg  total) by mouth daily after supper. 03/13/17   Barton Dubois, MD    Allergies    Penicillins and Latex  Review of Systems   Review of Systems  Musculoskeletal: Positive for arthralgias.  Skin: Positive for wound.  Neurological: Positive for headaches.  All other systems reviewed and are negative.   Physical Exam Updated Vital Signs BP (!) 152/100 (BP Location: Left Arm)   Pulse (!) 102   Temp 98.1 F (36.7 C) (Oral)   Resp 16   Ht _0  (1.753 m)   Wt 83.9 kg   SpO2 99%   BMI 27.32 kg/m   Physical Exam Vitals and nursing note reviewed.  Constitutional:      General: He is not in acute distress.    Appearance: He is well-developed.  HENT:     Head: Normocephalic.     Comments: R periorbital edema.  No trauma noted elsewhere to the face. No trismus or malocclusion Eyes:     Conjunctiva/sclera: Conjunctivae normal.     Pupils: Pupils are equal, round, and reactive to light.      Comments: Superficial 1 cm lac without gaping or bleeding.  EOMI and PERRLA  Neck:     Comments: No ttp of midline c-spine. No step offs or deformities. Moving head in all direction without pain.  Cardiovascular:     Rate and Rhythm: Normal rate and regular rhythm.     Pulses: Normal pulses.  Pulmonary:     Effort: Pulmonary effort is normal. No respiratory distress.     Breath sounds: Normal breath sounds. No wheezing.  Abdominal:     General: There is no distension.     Palpations: Abdomen is soft. There is no mass.     Tenderness: There is no abdominal tenderness. There is no guarding or rebound.     Comments: No ttp  Musculoskeletal:        General: Normal range of motion.     Cervical back: Normal range of motion and neck supple.     Comments: Tenderness palpation of the left wrist.  No obvious deformity.  Full active range of motion.  Radial pulses 2+ bilaterally.  Tenderness palpation of distal right middle finger.  No obvious deformity.  No laceration. No tenderness palpation of  back or midline spine.  No step-offs or deformities.   Skin:    General: Skin is warm and dry.     Capillary Refill: Capillary refill takes less than 2 seconds.  Neurological:     Mental Status: He is alert and oriented to person, place, and time.     ED Results / Procedures / Treatments   Labs (all labs ordered are listed, but only abnormal results are displayed) Labs Reviewed - No data to display  EKG None  Radiology DG Wrist Complete Left  Result Date: 02/25/2021 CLINICAL DATA:  Pain following fall EXAM: LEFT WRIST - COMPLETE 3+ VIEW COMPARISON:  None. FINDINGS: Frontal, oblique, lateral, and ulnar deviation scaphoid images obtained. No appreciable fracture or dislocation. The joint spaces appear normal. No erosive change. IMPRESSION: No fracture or dislocation.  No evident arthropathy. Electronically Signed   By: Lowella Grip III M.D.   On: 02/25/2021 14:15   CT Head Wo Contrast  Result Date: 02/25/2021 CLINICAL DATA:  Facial trauma. EXAM: CT HEAD WITHOUT CONTRAST CT MAXILLOFACIAL WITHOUT CONTRAST TECHNIQUE: Multidetector CT imaging of the head and maxillofacial structures were performed using the standard protocol without intravenous contrast. Multiplanar CT image reconstructions of the maxillofacial structures were also generated. COMPARISON:  None. FINDINGS: CT HEAD FINDINGS Brain: No evidence of acute infarction, hemorrhage, hydrocephalus, extra-axial collection or mass lesion/mass effect. Vascular: No hyperdense vessel identified. Skull: No acute fracture. Other: No mastoid effusions. CT MAXILLOFACIAL FINDINGS Osseous: No fracture or mandibular dislocation. No destructive process. Orbits: Negative. No traumatic or inflammatory finding. Sinuses: Minimal inferior right maxillary sinus mucosal thickening. Otherwise, sinuses are clear. No air-fluid levels. Soft tissues: Right periorbital and pre malar are soft tissue contusion. Bilateral premalar/cheek superficial hyperdensities,  most likely dermal calcifications but amenable to direct inspection to exclude foreign bodies. Heterogeneous submandibular and parotid glands bilaterally without evidence of active inflammation. IMPRESSION: CT head: No evidence of acute intracranial abnormality. Maxillofacial CT: 1. No evidence of acute fracture. 2. Right periorbital and pre malar are soft tissue contusion. Bilateral premalar/cheek superficial hyperdensities, likely dermal calcifications but amenable to direct inspection to exclude foreign bodies. 3. Heterogeneous parotid and submandibular glands bilaterally without evidence of active inflammation, which is nonspecific but may relate to chronic inflammation. Electronically Signed   By: Margaretha Sheffield MD   On: 02/25/2021 15:04   DG Finger Middle Right  Result Date: 02/25/2021 CLINICAL DATA:  Pain following fall EXAM: RIGHT THIRD FINGER 2+V COMPARISON:  None. FINDINGS: Frontal, oblique, and lateral views were obtained. There is a small focus of calcification lateral to the proximal aspect of the third proximal phalanx, a potential site of avulsion. No other findings suggesting potential fracture. No dislocation. Mild narrowing of the third PIP and DIP joints noted. No erosive change. IMPRESSION: Focus of calcification lateral to the proximal aspect of the third proximal phalanx. Question small avulsion in this area. No other evident potential fracture. No dislocation. Narrowing third PIP and DIP joints. Electronically Signed   By: Lowella Grip III M.D.   On: 02/25/2021 14:14   CT Maxillofacial Wo Contrast  Result Date: 02/25/2021 CLINICAL DATA:  Facial trauma. EXAM: CT HEAD WITHOUT CONTRAST CT MAXILLOFACIAL WITHOUT CONTRAST TECHNIQUE: Multidetector CT imaging of the head and maxillofacial structures were performed using the standard protocol without intravenous contrast. Multiplanar CT image reconstructions of the maxillofacial structures were also generated. COMPARISON:  None.  FINDINGS: CT HEAD FINDINGS Brain: No evidence of acute infarction, hemorrhage, hydrocephalus, extra-axial collection or mass lesion/mass effect. Vascular: No hyperdense vessel identified. Skull: No acute fracture. Other: No mastoid effusions. CT MAXILLOFACIAL FINDINGS Osseous: No fracture or mandibular dislocation. No destructive process. Orbits: Negative. No traumatic or inflammatory finding. Sinuses: Minimal inferior right maxillary sinus mucosal thickening. Otherwise, sinuses are clear. No air-fluid levels. Soft tissues: Right periorbital and pre malar are soft tissue contusion. Bilateral premalar/cheek superficial hyperdensities, most likely dermal calcifications but amenable to direct inspection to exclude foreign bodies. Heterogeneous submandibular and parotid glands bilaterally without evidence of active inflammation.  IMPRESSION: CT head: No evidence of acute intracranial abnormality. Maxillofacial CT: 1. No evidence of acute fracture. 2. Right periorbital and pre malar are soft tissue contusion. Bilateral premalar/cheek superficial hyperdensities, likely dermal calcifications but amenable to direct inspection to exclude foreign bodies. 3. Heterogeneous parotid and submandibular glands bilaterally without evidence of active inflammation, which is nonspecific but may relate to chronic inflammation. Electronically Signed   By: Margaretha Sheffield MD   On: 02/25/2021 15:04    Procedures Procedures   Medications Ordered in ED Medications  Tdap (BOOSTRIX) injection 0.5 mL (0.5 mLs Intramuscular Given 02/25/21 1521)    ED Course  I have reviewed the triage vital signs and the nursing notes.  Pertinent labs & imaging results that were available during my care of the patient were reviewed by me and considered in my medical decision making (see chart for details).    MDM Rules/Calculators/A&P                           Patient presenting for evaluation after a fall.  On exam, patient has obvious  trauma around the right eye.  Also reporting pain of his left wrist and right middle finger.  He is neurovascularly intact.  EOMI without difficulty.  He does have a small laceration just below his right eyebrow, no bleeding or gaping.  Considering patient's age and signs of head trauma, will obtain CT of head and maxillofacial.  He has no tenderness palpation of midline C-spine and is moving head in all directions without pain, I do not believe he needs CT imaging of the neck.  Will obtain x-rays of the left wrist and right finger as this is where patient has pain. As patient's fall was mechanical, will hold off on EKG, labs, or other ED work-up.  X-rays viewed interpreted by me, no fracture dislocation.  CT head and maxillofacial negative for acute fracture.  Does show soft tissue swelling consistent with clinical exam.  Discussed findings with patient and wife.  Laceration repaired with Dermabond, discussed aftercare instructions.  At this time, patient appears safe for discharge.  Return precautions given.  Patient states he understands and agrees to plan.  Final Clinical Impression(s) / ED Diagnoses Final diagnoses:  Injury of head, initial encounter  Left wrist pain  Pain of right middle finger  Fall, initial encounter    Rx / DC Orders ED Discharge Orders    None       Franchot Heidelberg, PA-C 02/25/21 1531    Horton, Alvin Critchley, DO 02/26/21 0708

## 2021-02-25 NOTE — ED Notes (Signed)
States tripped while trying to beat traffic on his morning walk, hitting his face on the pavement, abrasion to his right eye c/o pain left wrist and swelling to right middle finger. No loc. Was seen at [facility] and sent to ed.

## 2021-03-04 DIAGNOSIS — D631 Anemia in chronic kidney disease: Secondary | ICD-10-CM | POA: Diagnosis not present

## 2021-03-04 DIAGNOSIS — M1990 Unspecified osteoarthritis, unspecified site: Secondary | ICD-10-CM | POA: Diagnosis not present

## 2021-03-04 DIAGNOSIS — N1831 Chronic kidney disease, stage 3a: Secondary | ICD-10-CM | POA: Diagnosis not present

## 2021-03-04 DIAGNOSIS — I129 Hypertensive chronic kidney disease with stage 1 through stage 4 chronic kidney disease, or unspecified chronic kidney disease: Secondary | ICD-10-CM | POA: Diagnosis not present

## 2021-03-04 DIAGNOSIS — C9000 Multiple myeloma not having achieved remission: Secondary | ICD-10-CM | POA: Diagnosis not present

## 2021-03-04 DIAGNOSIS — E782 Mixed hyperlipidemia: Secondary | ICD-10-CM | POA: Diagnosis not present

## 2021-03-04 DIAGNOSIS — C641 Malignant neoplasm of right kidney, except renal pelvis: Secondary | ICD-10-CM | POA: Diagnosis not present

## 2021-03-04 DIAGNOSIS — R972 Elevated prostate specific antigen [PSA]: Secondary | ICD-10-CM | POA: Diagnosis not present

## 2021-03-16 DIAGNOSIS — C9000 Multiple myeloma not having achieved remission: Secondary | ICD-10-CM | POA: Diagnosis not present

## 2021-04-16 ENCOUNTER — Ambulatory Visit (HOSPITAL_COMMUNITY)
Admission: EM | Admit: 2021-04-16 | Discharge: 2021-04-16 | Disposition: A | Discharge status: Home / Self Care | Payer: Medicare HMO | Attending: Emergency Medicine | Admitting: Emergency Medicine

## 2021-04-16 ENCOUNTER — Encounter (HOSPITAL_COMMUNITY): Payer: Self-pay | Admitting: Emergency Medicine

## 2021-04-16 ENCOUNTER — Other Ambulatory Visit: Payer: Self-pay

## 2021-04-16 DIAGNOSIS — R102 Pelvic and perineal pain: Secondary | ICD-10-CM

## 2021-04-16 DIAGNOSIS — R310 Gross hematuria: Secondary | ICD-10-CM | POA: Diagnosis not present

## 2021-04-16 DIAGNOSIS — N390 Urinary tract infection, site not specified: Secondary | ICD-10-CM

## 2021-04-16 LAB — POCT URINALYSIS DIPSTICK, ED / UC
Glucose, UA: >=1000 mg/dL — AB
Ketones, ur: >=160 mg/dL — AB
Nitrite: POSITIVE — AB
Protein, ur: >=300 mg/dL — AB
Specific Gravity, Urine: <=1.005 (ref 1.005–1.030)
Urobilinogen, UA: >=8 mg/dL (ref 0.0–1.0)
pH: >=9 (ref 5.0–8.0)

## 2021-04-16 MED ORDER — SULFAMETHOXAZOLE-TRIMETHOPRIM 800-160 MG PO TABS: 1.0000 | ORAL_TABLET | Freq: Two times a day (BID) | ORAL | 0 refills | Status: AC

## 2021-04-16 NOTE — ED Triage Notes (Signed)
Pt presents with lower abdominal, dysuria, and blood in urine xs 3 days.

## 2021-04-16 NOTE — ED Provider Notes (Signed)
[place] [facility]    CSN: [id number] Arrival date & time: 04/16/21  0900      History   Chief Complaint Chief Complaint  Patient presents with  . Abdominal Pain  . Dysuria  . Hematuria    HPI Marcus Hatfield is a 75 y.o. male.   Marcus Hatfield presents with complaints of pain and frequency of urination which started around 3 days ago and worsened last night. Also with gross hematuria. States he feels constipated. No known fevers. History of similar in the past- recommended cystoscopy with urology as well as prostate biopsy, which patient has declined, per chart review. History of nephrectomy related to cancer, as well as MM. History of elevated PSA. No fevers or chills. Some right sided back pain. No other GI symptoms.     ROS per HPI, negative if not otherwise mentioned.      Past Medical History:  Diagnosis Date  . Arthritis   . CKD (chronic kidney disease) stage 3, GFR 30-59 ml/min (HCC) 20015   Creat 1.9  . Hypertension   . Multiple myeloma ([place]) 2016   [facility]  . Prostate disorder 02/2017    Patient Active Problem List   Diagnosis Date Noted  . Acute urinary retention 03/11/2017  . Hyponatremia 03/11/2017  . HTN (hypertension) 03/11/2017  . CKD (chronic kidney disease) stage 3, GFR 30-59 ml/min (HCC) 03/11/2017  . External hemorrhoid, thrombosed 04/16/2012    Past Surgical History:  Procedure Laterality Date  . KNEE SURGERY     over ten years ago  . NEPHRECTOMY Right 03/2015       Home Medications    Prior to Admission medications   Medication Sig Start Date End Date Taking? Authorizing Provider  sulfamethoxazole-trimethoprim (BACTRIM DS) 800-160 MG tablet Take 1 tablet by mouth 2 (two) times daily for 7 days. 04/16/21 04/23/21 Yes Tajana Crotteau, Malachy Moan, NP  acyclovir (ZOVIRAX) 400 MG tablet Take 400 mg by mouth daily. 02/01/17   [provider]  albuterol (PROVENTIL HFA;VENTOLIN HFA) 108 (90 BASE) MCG/ACT inhaler Inhale 1-2 puffs into  the lungs every 6 (six) hours as needed for wheezing. 02/12/12 03/11/17  Harden Mo, MD  amLODipine (NORVASC) 10 MG tablet Take 10 mg by mouth daily.  12/28/15   [provider]  aspirin EC 81 MG tablet Take 81 mg by mouth daily. 06/22/16   [provider]  cyclophosphamide (CYTOXAN) 50 MG capsule Take 600 mg by mouth every 7 (seven) days. Take days 1, 8, and 15 of 28 day cycle on 1st day of IV chemo on weeks with IV chemo 01/23/17   [provider]  docusate sodium (COLACE) 100 MG capsule Take 1 capsule (100 mg total) by mouth 2 (two) times daily. 03/13/17   Barton Dubois, MD  furosemide (LASIX) 20 MG tablet Take 1 tablet (20 mg total) by mouth daily. 03/13/17   Barton Dubois, MD  irbesartan (AVAPRO) 300 MG tablet Take 300 mg by mouth daily.  12/28/15   [provider]  lactulose (CHRONULAC) 10 GM/15ML solution Take 30 mLs by mouth daily as needed for constipation. 12/07/16   [provider]  mometasone (NASONEX) 50 MCG/ACT nasal spray Place 2 sprays into the nose daily.    [provider]  Multiple Vitamins-Minerals (MULTIVITAMIN WITH MINERALS) tablet Take 1 tablet by mouth daily.    [provider]  polyethylene glycol powder (GLYCOLAX/MIRALAX) powder Take 17 g by mouth daily as needed for constipation. 03/09/17   [provider]  pseudoephedrine-guaifenesin (MUCINEX D) 60-600 MG 12 hr tablet Take 1 tablet by mouth every 12 (twelve) hours as needed for congestion.     [provider]  tamsulosin (FLOMAX) 0.4 MG CAPS capsule Take 1 capsule (0.4 mg total) by mouth daily after supper. 03/13/17   Barton Dubois, MD    Family History Family History  Problem Relation Age of Onset  . Diabetes Father   . Hypertension Brother     Social History Social History   Tobacco Use  . Smoking status: Never Smoker  . Smokeless tobacco: Never Used  Substance Use Topics  . Alcohol use: No  . Drug use: No     Allergies    Penicillins and Latex   Review of Systems Review of Systems   Physical Exam Triage Vital Signs ED Triage Vitals  Enc Vitals Group     BP 04/16/21 0922 (!) 144/90     Pulse Rate 04/16/21 0922 86     Resp 04/16/21 0922 18     Temp 04/16/21 0922 (!) 97.5 F (36.4 C)     Temp src --      SpO2 04/16/21 0922 98 %     Weight --      Height --      Head Circumference --      Peak Flow --      Pain Score 04/16/21 0921 7     Pain Loc --      Pain Edu? --      Excl. in [place]? --    No data found.  Updated Vital Signs BP (!) 144/90 (BP Location: Right Arm)   Pulse 86   Temp (!) 97.5 F (36.4 C)   Resp 18   SpO2 98%    Physical Exam Constitutional:      Appearance: He is well-developed. He is ill-appearing.     Comments: Standing up due to discomfort, which does relieve somewhat after voiding   Cardiovascular:     Rate and Rhythm: Normal rate.  Pulmonary:     Effort: Pulmonary effort is normal.  Abdominal:     Tenderness: There is no abdominal tenderness.  Genitourinary:    Comments: Gross bloody urine noted; small amount initially, and then able to void approximately 40 cc of urine, which did relieve some of his lower abdominal discomfort; mild RCVA tenderness but no suprapubic pain on palpation Skin:    General: Skin is warm and dry.  Neurological:     Mental Status: He is alert and oriented to person, place, and time.      UC Treatments / Results  Labs (all labs ordered are listed, but only abnormal results are displayed) Labs Reviewed  POCT URINALYSIS DIPSTICK, ED / UC - Abnormal; Notable for the following components:      Result Value   Glucose, UA >=1000 (*)    Bilirubin Urine LARGE (*)    Ketones, ur >=160 (*)    Hgb urine dipstick LARGE (*)    Protein, ur >=300 (*)    Nitrite POSITIVE (*)    Leukocytes,Ua LARGE (*)    All other components within normal limits  URINE CULTURE    EKG   Radiology No results found.  Procedures Procedures  (including critical care time)  Medications Ordered in UC Medications - No data to display  Initial Impression / Assessment and Plan / UC Course  I have reviewed the triage vital signs and the nursing notes.  Pertinent labs & imaging  results that were available during my care of the patient were reviewed by me and considered in my medical decision making (see chart for details).     Urine is grossly abnormal, gross blood and with nitrite, ketones, bilirubin, leukocytes, protein on urine dip. Patient with penicillin allergy and denies any known history of tolerating rocephin. States that penicillin caused some mouth irritation. Initially there is some concern about urine retention due to his pain and low urine output, while at visit able to void larger amount. Emphasized strict ER precautions here today. Opted to provide bactrim today, CrCL >30 per chart review. Strict ER and urology follow up recommended. Urine culture pending. Patient verbalized understanding and agreeable to plan.    Final Clinical Impressions(s) / UC Diagnoses   Final diagnoses:  Lower urinary tract infectious disease  Gross hematuria  Pelvic pain     Discharge Instructions     Please start antibiotics now.  We will call you if we need to adjust your medication based on the culture we are testing.  Drink plenty of water to empty bladder regularly. Avoid alcohol and caffeine as these may irritate the bladder.   If you are unable to urinate, develop fever or increased pain please go to the ER.  Please schedule a follow up with your urologist for recheck.    ED Prescriptions    Medication Sig Dispense Auth. Provider   sulfamethoxazole-trimethoprim (BACTRIM DS) 800-160 MG tablet Take 1 tablet by mouth 2 (two) times daily for 7 days. 14 tablet Zigmund Gottron, NP     PDMP not reviewed this encounter.   Zigmund Gottron, NP 04/16/21 1103

## 2021-04-16 NOTE — Discharge Instructions (Signed)
Please start antibiotics now.  We will call you if we need to adjust your medication based on the culture we are testing.  Drink plenty of water to empty bladder regularly. Avoid alcohol and caffeine as these may irritate the bladder.   If you are unable to urinate, develop fever or increased pain please go to the ER.  Please schedule a follow up with your urologist for recheck.

## 2021-04-17 ENCOUNTER — Other Ambulatory Visit: Payer: Self-pay

## 2021-04-17 ENCOUNTER — Encounter (HOSPITAL_COMMUNITY): Payer: Self-pay | Admitting: Emergency Medicine

## 2021-04-17 ENCOUNTER — Emergency Department (HOSPITAL_COMMUNITY)
Admission: EM | Admit: 2021-04-17 | Discharge: 2021-04-17 | Disposition: A | Discharge status: Home / Self Care | Payer: Medicare HMO | Attending: Emergency Medicine | Admitting: Emergency Medicine

## 2021-04-17 DIAGNOSIS — N3091 Cystitis, unspecified with hematuria: Secondary | ICD-10-CM | POA: Diagnosis not present

## 2021-04-17 DIAGNOSIS — Z7982 Long term (current) use of aspirin: Secondary | ICD-10-CM | POA: Insufficient documentation

## 2021-04-17 DIAGNOSIS — B9689 Other specified bacterial agents as the cause of diseases classified elsewhere: Secondary | ICD-10-CM | POA: Insufficient documentation

## 2021-04-17 DIAGNOSIS — R338 Other retention of urine: Secondary | ICD-10-CM

## 2021-04-17 DIAGNOSIS — N183 Chronic kidney disease, stage 3 unspecified: Secondary | ICD-10-CM | POA: Insufficient documentation

## 2021-04-17 DIAGNOSIS — Z79899 Other long term (current) drug therapy: Secondary | ICD-10-CM | POA: Diagnosis not present

## 2021-04-17 DIAGNOSIS — N3289 Other specified disorders of bladder: Secondary | ICD-10-CM | POA: Insufficient documentation

## 2021-04-17 DIAGNOSIS — I129 Hypertensive chronic kidney disease with stage 1 through stage 4 chronic kidney disease, or unspecified chronic kidney disease: Secondary | ICD-10-CM | POA: Insufficient documentation

## 2021-04-17 DIAGNOSIS — N3090 Cystitis, unspecified without hematuria: Secondary | ICD-10-CM | POA: Diagnosis not present

## 2021-04-17 DIAGNOSIS — R339 Retention of urine, unspecified: Secondary | ICD-10-CM | POA: Insufficient documentation

## 2021-04-17 DIAGNOSIS — Z8579 Personal history of other malignant neoplasms of lymphoid, hematopoietic and related tissues: Secondary | ICD-10-CM | POA: Diagnosis not present

## 2021-04-17 LAB — CBC WITH DIFFERENTIAL/PLATELET
Abs Immature Granulocytes: 0.02 10*3/uL (ref 0.00–0.07)
Basophils Absolute: 0 10*3/uL (ref 0.0–0.1)
Basophils Relative: 1 %
Eosinophils Absolute: 0.1 10*3/uL (ref 0.0–0.5)
Eosinophils Relative: 1 %
HCT: 32.8 % — ABNORMAL LOW (ref 39.0–52.0)
Hemoglobin: 10.7 g/dL — ABNORMAL LOW (ref 13.0–17.0)
Immature Granulocytes: 0 %
Lymphocytes Relative: 20 %
Lymphs Abs: 1 10*3/uL (ref 0.7–4.0)
MCH: 28 pg (ref 26.0–34.0)
MCHC: 32.6 g/dL (ref 30.0–36.0)
MCV: 85.9 fL (ref 80.0–100.0)
Monocytes Absolute: 0.6 10*3/uL (ref 0.1–1.0)
Monocytes Relative: 11 %
Neutro Abs: 3.4 10*3/uL (ref 1.7–7.7)
Neutrophils Relative %: 67 %
Platelets: 187 10*3/uL (ref 150–400)
RBC: 3.82 MIL/uL — ABNORMAL LOW (ref 4.22–5.81)
RDW: 14.1 % (ref 11.5–15.5)
WBC: 5.1 10*3/uL (ref 4.0–10.5)
nRBC: 0 % (ref 0.0–0.2)

## 2021-04-17 LAB — BASIC METABOLIC PANEL
Anion gap: 12 (ref 5–15)
BUN: 12 mg/dL (ref 8–23)
CO2: 20 mmol/L — ABNORMAL LOW (ref 22–32)
Calcium: 8.4 mg/dL — ABNORMAL LOW (ref 8.9–10.3)
Chloride: 92 mmol/L — ABNORMAL LOW (ref 98–111)
Creatinine, Ser: 1.8 mg/dL — ABNORMAL HIGH (ref 0.61–1.24)
GFR, Estimated: 39 mL/min — ABNORMAL LOW (ref >60)
Glucose, Bld: 104 mg/dL — ABNORMAL HIGH (ref 70–99)
Potassium: 3.3 mmol/L — ABNORMAL LOW (ref 3.5–5.1)
Sodium: 124 mmol/L — ABNORMAL LOW (ref 135–145)

## 2021-04-17 LAB — URINALYSIS, ROUTINE W REFLEX MICROSCOPIC

## 2021-04-17 LAB — URINE CULTURE: Culture: <10000 — AB

## 2021-04-17 LAB — URINALYSIS, MICROSCOPIC (REFLEX): RBC / HPF: >50 RBC/hpf (ref 0–5)

## 2021-04-17 MED ORDER — TAMSULOSIN HCL 0.4 MG PO CAPS
0.4000 mg | ORAL_CAPSULE | Freq: Once | ORAL | Status: AC
  Administered 2021-04-17: 0.4 mg via ORAL
  Filled 2021-04-17: qty 1

## 2021-04-17 MED ORDER — FENTANYL CITRATE (PF) 100 MCG/2ML IJ SOLN
25.0000 ug | Freq: Once | INTRAMUSCULAR | Status: AC
Start: 2021-04-17 — End: 2021-04-17
  Administered 2021-04-17: 25 ug via INTRAVENOUS
  Filled 2021-04-17: qty 2

## 2021-04-17 NOTE — Discharge Instructions (Addendum)
Please follow closely with the urologist and your primary care provider.  You will need your sodium level rechecked.  You also need to be reevaluated for catheter management and for removal.  It is important you take the antibiotic until gone. Return if your bag stops draining urine, if you develop fever, vomiting, or if you become faint/lightheaded, or extremely fatigued .

## 2021-04-17 NOTE — ED Triage Notes (Signed)
Pt reports pelvic pain, urinary retention, and hematuria.  States he was seen in ED yesterday for same and was able to urinate a small amount.

## 2021-04-17 NOTE — ED Notes (Signed)
Bladder scan >532mL

## 2021-04-17 NOTE — ED Notes (Signed)
Fentanyl waste not populating in pyxis. Per charge RN, wasted with Thayer Dallas in [employer]. Administration edited to include signed off waste. Note made in chart with Thayer Dallas.

## 2021-04-17 NOTE — ED Provider Notes (Signed)
[facility] Provider Note   CSN: [id number] Arrival date & time: 04/17/21  1154     History Chief Complaint  Patient presents with  . Urinary Retention    Marcus Hatfield is a 75 y.o. male past medical history of CKD, status post right nephrectomy, hypertension, multiple myeloma, BPH, presenting for evaluation of urinary retention.  Patient states over the week he has had intermittent hematuria, urinary frequency and urgency.  Yesterday he began having difficulty urinating and went to urgent care.  Per urgent care note, he was able to urinate some while at urgent care, he was discharged on Bactrim.  He states around 2:58 AM he began unable to urinate at all.  He has not urinated since then.  He has gradually worsening discomfort in the suprapubic area.  Denies nausea or vomiting.  Takes baby aspirin daily, not on other anticoagulation.  No fevers.  Has history of BPH, takes tamsulosin, has not taken it today.  The history is provided by the patient and medical records.       Past Medical History:  Diagnosis Date  . Arthritis   . CKD (chronic kidney disease) stage 3, GFR 30-59 ml/min (HCC) 20015   Creat 1.9  . Hypertension   . Multiple myeloma ([place]) 2016   [facility]  . Prostate disorder 02/2017    Patient Active Problem List   Diagnosis Date Noted  . Acute urinary retention 03/11/2017  . Hyponatremia 03/11/2017  . HTN (hypertension) 03/11/2017  . CKD (chronic kidney disease) stage 3, GFR 30-59 ml/min (HCC) 03/11/2017  . External hemorrhoid, thrombosed 04/16/2012    Past Surgical History:  Procedure Laterality Date  . KNEE SURGERY     over ten years ago  . NEPHRECTOMY Right 03/2015       Family History  Problem Relation Age of Onset  . Diabetes Father   . Hypertension Brother     Social History   Tobacco Use  . Smoking status: Never Smoker  . Smokeless tobacco: Never Used  Substance Use Topics  . Alcohol use: No  .  Drug use: No    Home Medications Prior to Admission medications   Medication Sig Start Date End Date Taking? Authorizing Provider  acetaminophen (TYLENOL) 500 MG tablet Take 500 mg by mouth every 4 (four) hours as needed for pain.   Yes [provider]  amLODipine (NORVASC) 10 MG tablet Take 10 mg by mouth daily.  12/28/15  Yes [provider]  aspirin EC 81 MG tablet Take 81 mg by mouth daily. 06/22/16  Yes [provider]  fluticasone (FLONASE) 50 MCG/ACT nasal spray Place 2 sprays into both nostrils daily as needed for allergies. 02/19/21  Yes [provider]  furosemide (LASIX) 20 MG tablet Take 1 tablet (20 mg total) by mouth daily. 03/13/17  Yes Barton Dubois, MD  irbesartan (AVAPRO) 300 MG tablet Take 300 mg by mouth daily.  12/28/15  Yes [provider]  lactulose (CHRONULAC) 10 GM/15ML solution Take 30 mLs by mouth daily as needed for constipation. 12/07/16  Yes [provider]  Multiple Vitamins-Minerals (MULTIVITAMIN WITH MINERALS) tablet Take 1 tablet by mouth daily.   Yes [provider]  sulfamethoxazole-trimethoprim (BACTRIM DS) 800-160 MG tablet Take 1 tablet by mouth 2 (two) times daily for 7 days. 04/16/21 04/23/21 Yes Burky, Malachy Moan, NP  tamsulosin (FLOMAX) 0.4 MG CAPS capsule Take 1 capsule (0.4 mg total) by mouth daily after supper. 03/13/17  Yes  Barton Dubois, MD    Allergies    Penicillins and Latex  Review of Systems   Review of Systems  Constitutional: Negative for fever.  Gastrointestinal: Positive for abdominal pain. Negative for nausea and vomiting.  Genitourinary: Positive for difficulty urinating, hematuria and urgency.  All other systems reviewed and are negative.   Physical Exam Updated Vital Signs BP 132/72   Pulse 61   Temp 98.5 F (36.9 C) (Oral)   Resp 14   Ht _0  (1.753 m)   Wt 83 kg   SpO2 100%   BMI 27.02 kg/m   Physical Exam Vitals and nursing note reviewed.  Constitutional:       General: He is not in acute distress.    Appearance: He is well-developed.  HENT:     Head: Normocephalic and atraumatic.  Eyes:     Conjunctiva/sclera: Conjunctivae normal.  Cardiovascular:     Rate and Rhythm: Normal rate and regular rhythm.  Pulmonary:     Effort: Pulmonary effort is normal.     Breath sounds: Normal breath sounds.  Abdominal:     General: Bowel sounds are normal.     Palpations: Abdomen is soft.     Comments: Bladder bladder is distended up to the umbilicus.  Skin:    General: Skin is warm.  Neurological:     Mental Status: He is alert.  Psychiatric:        Behavior: Behavior normal.     ED Results / Procedures / Treatments   Labs (all labs ordered are listed, but only abnormal results are displayed) Labs Reviewed  URINALYSIS, ROUTINE W REFLEX MICROSCOPIC - Abnormal; Notable for the following components:      Result Value   Color, Urine RED (*)    APPearance TURBID (*)    Glucose, UA   (*)    Value: TEST NOT REPORTED DUE TO COLOR INTERFERENCE OF URINE PIGMENT   Hgb urine dipstick   (*)    Value: TEST NOT REPORTED DUE TO COLOR INTERFERENCE OF URINE PIGMENT   Bilirubin Urine   (*)    Value: TEST NOT REPORTED DUE TO COLOR INTERFERENCE OF URINE PIGMENT   Ketones, ur   (*)    Value: TEST NOT REPORTED DUE TO COLOR INTERFERENCE OF URINE PIGMENT   Protein, ur   (*)    Value: TEST NOT REPORTED DUE TO COLOR INTERFERENCE OF URINE PIGMENT   Nitrite   (*)    Value: TEST NOT REPORTED DUE TO COLOR INTERFERENCE OF URINE PIGMENT   Leukocytes,Ua   (*)    Value: TEST NOT REPORTED DUE TO COLOR INTERFERENCE OF URINE PIGMENT   All other components within normal limits  BASIC METABOLIC PANEL - Abnormal; Notable for the following components:   Sodium 124 (*)    Potassium 3.3 (*)    Chloride 92 (*)    CO2 20 (*)    Glucose, Bld 104 (*)    Creatinine, Ser 1.80 (*)    Calcium 8.4 (*)    GFR, Estimated 39 (*)    All other components within normal limits  CBC WITH  DIFFERENTIAL/PLATELET - Abnormal; Notable for the following components:   RBC 3.82 (*)    Hemoglobin 10.7 (*)    HCT 32.8 (*)    All other components within normal limits  URINALYSIS, MICROSCOPIC (REFLEX) - Abnormal; Notable for the following components:   Bacteria, UA FIELD OBSCURED BY RBC'S (*)    All other components within normal limits  URINE CULTURE    EKG None  Radiology No results found.  Procedures Procedures   Medications Ordered in ED Medications  tamsulosin (FLOMAX) capsule 0.4 mg (0.4 mg Oral Given 04/17/21 1355)  fentaNYL (SUBLIMAZE) injection 25 mcg (25 mcg Intravenous Given 04/17/21 1354)    ED Course  I have reviewed the triage vital signs and the nursing notes.  Pertinent labs & imaging results that were available during my care of the patient were reviewed by me and considered in my medical decision making (see chart for details).    MDM Rules/Calculators/A&P                          Patient with urinary retention began early this morning after about 1 week of urinary symptoms with intermittent hematuria.  Was passing some intermittent clots at home over the week.  Has history of BPH which may be contributory to patient's retention.  Foley catheter was placed with bladder scan showing rater than 500 cc.  Greater than 1000 cc was drained from the bladder with Foley placement.  Only small amount of clots initially, and on reevaluation continues to drain blood-tinged urine without clots present.  Discussed with attending physician Dr. Ashok Cordia. Do not believe bladder irrigation is indicated considering absence of continuous clots here. Also does not recommend imaging with presence of urinary sx during the week, no flank pain to suggest stone, nitrite positive urine yesterday at UC. Hgb is stable. No leukocytosis. Renal function as baseline. Hyponatremia is noted though this is chronic per chart review and confirmed by patient. He was started on bactrim yesterday at UC,  with nitrite positive dipstick.  encourage he continue this medication. Urology referral provided for follow up, strict return precautions. Pt discharged with foley in place.  Discussed results, findings, treatment and follow up. Patient advised of return precautions. Patient verbalized understanding and agreed with plan.  Final Clinical Impression(s) / ED Diagnoses Final diagnoses:  Acute urinary retention  Hemorrhagic cystitis    Rx / DC Orders ED Discharge Orders    None       Ashely Goosby, [place] N, PA-C 04/17/21 1555    Lajean Saver, MD 04/17/21 [phone]

## 2021-04-19 LAB — URINE CULTURE: Culture: NO GROWTH

## 2021-04-21 DIAGNOSIS — R310 Gross hematuria: Secondary | ICD-10-CM | POA: Diagnosis not present

## 2021-04-21 DIAGNOSIS — N138 Other obstructive and reflux uropathy: Secondary | ICD-10-CM | POA: Diagnosis not present

## 2021-04-28 DIAGNOSIS — N138 Other obstructive and reflux uropathy: Secondary | ICD-10-CM | POA: Diagnosis not present

## 2021-04-28 DIAGNOSIS — R310 Gross hematuria: Secondary | ICD-10-CM | POA: Diagnosis not present

## 2021-04-29 DIAGNOSIS — H524 Presbyopia: Secondary | ICD-10-CM | POA: Diagnosis not present

## 2021-04-29 DIAGNOSIS — Z961 Presence of intraocular lens: Secondary | ICD-10-CM | POA: Diagnosis not present

## 2021-04-29 DIAGNOSIS — D3132 Benign neoplasm of left choroid: Secondary | ICD-10-CM | POA: Diagnosis not present

## 2021-04-30 DIAGNOSIS — D72828 Other elevated white blood cell count: Secondary | ICD-10-CM | POA: Diagnosis not present

## 2021-04-30 DIAGNOSIS — R300 Dysuria: Secondary | ICD-10-CM | POA: Diagnosis not present

## 2021-04-30 DIAGNOSIS — Z87448 Personal history of other diseases of urinary system: Secondary | ICD-10-CM | POA: Diagnosis not present

## 2021-04-30 DIAGNOSIS — R319 Hematuria, unspecified: Secondary | ICD-10-CM | POA: Diagnosis not present

## 2021-05-01 ENCOUNTER — Emergency Department (HOSPITAL_COMMUNITY)
Admission: EM | Admit: 2021-05-01 | Discharge: 2021-05-01 | Disposition: A | Discharge status: Home / Self Care | Payer: Medicare HMO | Source: Home / Self Care | Attending: Emergency Medicine | Admitting: Emergency Medicine

## 2021-05-01 ENCOUNTER — Other Ambulatory Visit: Payer: Self-pay

## 2021-05-01 DIAGNOSIS — Z7982 Long term (current) use of aspirin: Secondary | ICD-10-CM | POA: Insufficient documentation

## 2021-05-01 DIAGNOSIS — Z91040 Latex allergy status: Secondary | ICD-10-CM | POA: Insufficient documentation

## 2021-05-01 DIAGNOSIS — R339 Retention of urine, unspecified: Secondary | ICD-10-CM | POA: Insufficient documentation

## 2021-05-01 DIAGNOSIS — N183 Chronic kidney disease, stage 3 unspecified: Secondary | ICD-10-CM | POA: Insufficient documentation

## 2021-05-01 DIAGNOSIS — I129 Hypertensive chronic kidney disease with stage 1 through stage 4 chronic kidney disease, or unspecified chronic kidney disease: Secondary | ICD-10-CM | POA: Insufficient documentation

## 2021-05-01 DIAGNOSIS — Z79899 Other long term (current) drug therapy: Secondary | ICD-10-CM | POA: Insufficient documentation

## 2021-05-01 DIAGNOSIS — R319 Hematuria, unspecified: Secondary | ICD-10-CM

## 2021-05-01 LAB — BASIC METABOLIC PANEL
Anion gap: 11 (ref 5–15)
BUN: 12 mg/dL (ref 8–23)
CO2: 21 mmol/L — ABNORMAL LOW (ref 22–32)
Calcium: 8 mg/dL — ABNORMAL LOW (ref 8.9–10.3)
Chloride: 90 mmol/L — ABNORMAL LOW (ref 98–111)
Creatinine, Ser: 2.61 mg/dL — ABNORMAL HIGH (ref 0.61–1.24)
GFR, Estimated: 25 mL/min — ABNORMAL LOW (ref >60)
Glucose, Bld: 101 mg/dL — ABNORMAL HIGH (ref 70–99)
Potassium: 3.4 mmol/L — ABNORMAL LOW (ref 3.5–5.1)
Sodium: 122 mmol/L — ABNORMAL LOW (ref 135–145)

## 2021-05-01 LAB — CBC
HCT: 29.6 % — ABNORMAL LOW (ref 39.0–52.0)
Hemoglobin: 9.9 g/dL — ABNORMAL LOW (ref 13.0–17.0)
MCH: 28.4 pg (ref 26.0–34.0)
MCHC: 33.4 g/dL (ref 30.0–36.0)
MCV: 84.8 fL (ref 80.0–100.0)
Platelets: 285 10*3/uL (ref 150–400)
RBC: 3.49 MIL/uL — ABNORMAL LOW (ref 4.22–5.81)
RDW: 13.8 % (ref 11.5–15.5)
WBC: 6.4 10*3/uL (ref 4.0–10.5)
nRBC: 0 % (ref 0.0–0.2)

## 2021-05-01 MED ORDER — TAMSULOSIN HCL 0.4 MG PO CAPS
0.4000 mg | ORAL_CAPSULE | Freq: Every day | ORAL | 1 refills | Status: DC
Start: 2021-05-01 — End: 2023-06-06

## 2021-05-01 MED ORDER — SODIUM CHLORIDE 0.9 % IV SOLN
1.0000 g | Freq: Once | INTRAVENOUS | Status: AC
  Administered 2021-05-01: 1 g via INTRAVENOUS
  Filled 2021-05-01: qty 10

## 2021-05-01 MED ORDER — OXYCODONE-ACETAMINOPHEN 5-325 MG PO TABS
1.0000 | ORAL_TABLET | Freq: Once | ORAL | Status: AC
  Administered 2021-05-01: 1 via ORAL
  Filled 2021-05-01: qty 1

## 2021-05-01 MED ORDER — CEPHALEXIN 500 MG PO CAPS: 500.0000 mg | ORAL_CAPSULE | Freq: Three times a day (TID) | ORAL | 0 refills | Status: DC

## 2021-05-01 MED ORDER — TAMSULOSIN HCL 0.4 MG PO CAPS: 0.4000 mg | ORAL_CAPSULE | Freq: Every day | ORAL | 1 refills | Status: DC

## 2021-05-01 MED ORDER — MORPHINE SULFATE (PF) 2 MG/ML IV SOLN
2.0000 mg | Freq: Once | INTRAVENOUS | Status: AC
  Administered 2021-05-01: 2 mg via INTRAVENOUS
  Filled 2021-05-01: qty 1

## 2021-05-01 NOTE — ED Provider Notes (Signed)
[facility] EMERGENCY DEPARTMENT Provider Note   CSN: [id number] Arrival date & time: 05/01/21  8338     History Chief Complaint  Patient presents with  . Urinary Retention    Marcus Hatfield is a 75 y.o. male.  HPI Patient is a 75 year old male with past medical history significant for arthritis, CKD 3, HTN, multiple myeloma, BPH on Flomax  Patient is a 75 year old male with a history of BPH on Flomax history of retention was seen 04/17/2021 in emergency room and had Foley catheter placed.  He states he had a follow-up appoint with his urologist where it was removed but states that on Thursday he noticed that he was having some hesitancy and dribbling and then yesterday but read through the day he states he started having difficulty urinating at all although he felt some urgency and need to urinate.  He states that today he has not been able to urinate at all.  He states he now has some suprapubic pain that is achy feels like pressure.      Past Medical History:  Diagnosis Date  . Arthritis   . CKD (chronic kidney disease) stage 3, GFR 30-59 ml/min (HCC) 20015   Creat 1.9  . Hypertension   . Multiple myeloma ([place]) 2016   [facility]  . Prostate disorder 02/2017    Patient Active Problem List   Diagnosis Date Noted  . Acute urinary retention 03/11/2017  . Hyponatremia 03/11/2017  . HTN (hypertension) 03/11/2017  . CKD (chronic kidney disease) stage 3, GFR 30-59 ml/min (HCC) 03/11/2017  . External hemorrhoid, thrombosed 04/16/2012    Past Surgical History:  Procedure Laterality Date  . KNEE SURGERY     over ten years ago  . NEPHRECTOMY Right 03/2015       Family History  Problem Relation Age of Onset  . Diabetes Father   . Hypertension Brother     Social History   Tobacco Use  . Smoking status: Never Smoker  . Smokeless tobacco: Never Used  Substance Use Topics  . Alcohol use: No  . Drug use: No    Home Medications Prior to  Admission medications   Medication Sig Start Date End Date Taking? Authorizing Provider  acetaminophen (TYLENOL) 500 MG tablet Take 500 mg by mouth every 4 (four) hours as needed for pain.   Yes [provider]  amLODipine (NORVASC) 10 MG tablet Take 10 mg by mouth daily.  12/28/15  Yes [provider]  aspirin EC 81 MG tablet Take 81 mg by mouth daily. 06/22/16  Yes [provider]  cephALEXin (KEFLEX) 500 MG capsule Take 1 capsule (500 mg total) by mouth 3 (three) times daily for 5 days. 05/01/21 05/06/21 Yes Kamaile Zachow S, PA  fluticasone (FLONASE) 50 MCG/ACT nasal spray Place 2 sprays into both nostrils daily as needed for allergies. 02/19/21  Yes [provider]  furosemide (LASIX) 20 MG tablet Take 1 tablet (20 mg total) by mouth daily. 03/13/17  Yes Barton Dubois, MD  irbesartan (AVAPRO) 300 MG tablet Take 300 mg by mouth daily.  12/28/15  Yes [provider]  lactulose (CHRONULAC) 10 GM/15ML solution Take 30 mLs by mouth daily as needed for constipation. 12/07/16  Yes [provider]  Misc Natural Products (PROSTATE SUPPORT PO) Take 1 tablet by mouth daily.   Yes [provider]  Multiple Vitamins-Minerals (MULTIVITAMIN WITH MINERALS) tablet Take 1 tablet by mouth daily.   Yes [provider]  telmisartan (MICARDIS)  80 MG tablet Take 80 mg by mouth daily.   Yes [provider]  tamsulosin (FLOMAX) 0.4 MG CAPS capsule Take 1 capsule (0.4 mg total) by mouth daily after supper. 05/01/21   Tedd Sias, PA    Allergies    Penicillins and Latex  Review of Systems   Review of Systems  Constitutional: Positive for fatigue. Negative for chills and fever.  HENT: Negative for congestion.   Eyes: Negative for pain.  Respiratory: Negative for cough and shortness of breath.   Cardiovascular: Negative for chest pain and leg swelling.  Gastrointestinal: Positive for abdominal pain, nausea and vomiting. Negative for  constipation.  Genitourinary: Positive for difficulty urinating. Negative for dysuria.  Musculoskeletal: Negative for myalgias.  Skin: Negative for rash.  Neurological: Negative for dizziness and headaches.    Physical Exam Updated Vital Signs BP (!) 153/73   Pulse 69   Temp 98.1 F (36.7 C) (Oral)   Resp 17   SpO2 99%   Physical Exam Vitals and nursing note reviewed.  Constitutional:      General: He is not in acute distress. HENT:     Head: Normocephalic and atraumatic.     Nose: Nose normal.     Mouth/Throat:     Mouth: Mucous membranes are moist.  Eyes:     General: No scleral icterus. Cardiovascular:     Rate and Rhythm: Normal rate and regular rhythm.     Pulses: Normal pulses.     Heart sounds: Normal heart sounds.  Pulmonary:     Effort: Pulmonary effort is normal. No respiratory distress.     Breath sounds: No wheezing.  Abdominal:     Palpations: Abdomen is soft.     Tenderness: There is abdominal tenderness. There is no right CVA tenderness, left CVA tenderness, guarding or rebound.     Comments: Suprapubic tenderness to palpation.  Musculoskeletal:     Cervical back: Normal range of motion.     Right lower leg: No edema.     Left lower leg: No edema.  Skin:    General: Skin is warm and dry.     Capillary Refill: Capillary refill takes less than 2 seconds.  Neurological:     Mental Status: He is alert. Mental status is at baseline.  Psychiatric:        Mood and Affect: Mood normal.        Behavior: Behavior normal.     ED Results / Procedures / Treatments   Labs (all labs ordered are listed, but only abnormal results are displayed) Labs Reviewed  BASIC METABOLIC PANEL - Abnormal; Notable for the following components:      Result Value   Sodium 122 (*)    Potassium 3.4 (*)    Chloride 90 (*)    CO2 21 (*)    Glucose, Bld 101 (*)    Creatinine, Ser 2.61 (*)    Calcium 8.0 (*)    GFR, Estimated 25 (*)    All other components within normal  limits  CBC - Abnormal; Notable for the following components:   RBC 3.49 (*)    Hemoglobin 9.9 (*)    HCT 29.6 (*)    All other components within normal limits  URINE CULTURE  URINALYSIS, ROUTINE W REFLEX MICROSCOPIC    EKG None  Radiology No results found.  Procedures Procedures   Medications Ordered in ED Medications  cefTRIAXone (ROCEPHIN) 1 g in sodium chloride 0.9 % 100 mL IVPB (has no  administration in time range)  oxyCODONE-acetaminophen (PERCOCET/ROXICET) 5-325 MG per tablet 1 tablet (1 tablet Oral Given 05/01/21 1240)  morphine 2 MG/ML injection 2 mg (2 mg Intravenous Given 05/01/21 1437)    ED Course  I have reviewed the triage vital signs and the nursing notes.  Pertinent labs & imaging results that were available during my care of the patient were reviewed by me and considered in my medical decision making (see chart for details).   Clinical Course as of 05/01/21 1510  Sat May 01, 2021  8469 Chronic anemia with no significant changes. [WF]  0929 BMP with remarkable abnormalities consisting of hyponatremia which is not significantly far from patient's baseline and seems to be a chronic ongoing issue for patient however he also has an increase in creatinine from 2 weeks ago.  2 weeks ago creatinine was 1.8 it is now 2.61 [WF]  1120 Discussed with Dr. Gilford Rile of urology who recommended 22 French catheter with three-way valve to irrigate.  From a urology standpoint does not need to be admitted to the hospital. [WF]    Clinical Course User Index [WF] Tedd Sias, [place]  I discussed this case with my attending physician who cosigned this note including patient's presenting symptoms, physical exam, and planned diagnostics and interventions. Attending physician stated agreement with plan or made changes to plan which were implemented.  Attending physician assessed patient at bedside. 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 Rt nephrectomy 02/03/16 w cryotherapy for left kidney.  Was recommended cystoscopy but declined this and prostate biopsy.  MR abd wwo cont 10/13/20 2.5cm renal cell carcinoma.   2:05 PM --discussed with Dr. Gilford Rile again.  Given that we have not been able to effectively irrigate this gentleman's   Dr. Gilford Rile has seen pt recommends 1 g of Rocephin, home with Keflex, follow-up with urology he recommends an additional bag CBI of irrigation and discharged with Flomax.  I discussed with patient and he states that he has never had allergic reaction to Keflex or Rocephin before.  He does not recall which antibiotics he has been on in the past states that he did have an itchy mouth with penicillin once many years ago has not had any reactions antibiotics since.  He states that he had a cold at that time was being treated with antibiotics.  We will provide patient with 1 g of Rocephin and oncoming team will reevaluate if he has any reaction will receive epinephrine/steroids as needed.   Final Clinical Impression(s) / ED Diagnoses Final diagnoses:  Urinary retention  Hematuria, unspecified type    Rx / DC Orders ED Discharge Orders         Ordered    tamsulosin (FLOMAX) 0.4 MG CAPS capsule  Daily after supper        05/01/21 1509    cephALEXin (KEFLEX) 500 MG capsule  3 times daily        05/01/21 1509           Tedd Sias, [place] 05/01/21 [place], [place], DO 05/02/21 [phone]

## 2021-05-01 NOTE — Consult Note (Signed)
Urology Consult   Physician requesting consult: Lennice Sites, DO  Reason for consult: Urinary retention and hematuria  History of Present Illness: Marcus Hatfield is a 75 y.o. male with a history of renal cell carcinoma involving the right kidney, status post lap radical nephrectomy in 2017 at [facility].  He also has a significant history of BPH with LUTS, CKD, HTN and multiple myeloma.  He presents to the [facility] emergency department with a 24-hour history of difficulty urinating and painless gross hematuria.  He is a non-smoker, has no history of kidney stones and is currently on aspirin once daily.  His last surveillance imaging was in 2017.  The ER provider attempted to place a Foley catheter, but did not advance the catheter into the bladder, which exacerbated his gross hematuria.  I was able to easily advance the catheter into the bladder and hand irrigate numerous clots out before the urine eventually cleared.  He currently has a 22 [place] three-way Foley catheter with 30 mL of sterile water in the balloon in place.    Past Medical History:  Diagnosis Date  . Arthritis   . CKD (chronic kidney disease) stage 3, GFR 30-59 ml/min (HCC) 20015   Creat 1.9  . Hypertension   . Multiple myeloma ([place]) 2016   [facility]  . Prostate disorder 02/2017    Past Surgical History:  Procedure Laterality Date  . KNEE SURGERY     over ten years ago  . NEPHRECTOMY Right 03/2015    Current Hospital Medications:  Home Meds:  Current Meds  Medication Sig  . acetaminophen (TYLENOL) 500 MG tablet Take 500 mg by mouth every 4 (four) hours as needed for pain.  Marland Kitchen amLODipine (NORVASC) 10 MG tablet Take 10 mg by mouth daily.   Marland Kitchen aspirin EC 81 MG tablet Take 81 mg by mouth daily.  . cephALEXin (KEFLEX) 500 MG capsule Take 1 capsule (500 mg total) by mouth 3 (three) times daily for 5 days.  . fluticasone (FLONASE) 50 MCG/ACT nasal spray Place 2 sprays into both nostrils daily as needed for  allergies.  . furosemide (LASIX) 20 MG tablet Take 1 tablet (20 mg total) by mouth daily.  . irbesartan (AVAPRO) 300 MG tablet Take 300 mg by mouth daily.   Marland Kitchen lactulose (CHRONULAC) 10 GM/15ML solution Take 30 mLs by mouth daily as needed for constipation.  . Misc Natural Products (PROSTATE SUPPORT PO) Take 1 tablet by mouth daily.  . Multiple Vitamins-Minerals (MULTIVITAMIN WITH MINERALS) tablet Take 1 tablet by mouth daily.  Marland Kitchen telmisartan (MICARDIS) 80 MG tablet Take 80 mg by mouth daily.  . [DISCONTINUED] tamsulosin (FLOMAX) 0.4 MG CAPS capsule Take 1 capsule (0.4 mg total) by mouth daily after supper.    Scheduled Meds: Continuous Infusions: . cefTRIAXone (ROCEPHIN)  IV     PRN Meds:.  Allergies:  Allergies  Allergen Reactions  . Penicillins Hives    Has patient had a PCN reaction causing immediate rash, facial/tongue/throat swelling, SOB or lightheadedness with hypotension:Yes Has patient had a PCN reaction causing severe rash involving mucus membranes or skin necrosis: Yes Has patient had a PCN reaction that required hospitalization No Has patient had a PCN reaction occurring within the last 10 years: No If all of the above answers are "NO", then may proceed with Cephalosporin use.   . Latex Rash    Family History  Problem Relation Age of Onset  . Diabetes Father   . Hypertension Brother  Social History:  reports that he has never smoked. He has never used smokeless tobacco. He reports that he does not drink alcohol and does not use drugs.  ROS: A complete review of systems was performed.  All systems are negative except for pertinent findings as noted.  Physical Exam:  Vital signs in last 24 hours: Temp:  [98.1 F (36.7 C)-98.4 F (36.9 C)] 98.1 F (36.7 C) (05/14 1218) Pulse Rate:  [55-73] 69 (05/14 1445) Resp:  [14-24] 17 (05/14 1445) BP: (119-153)/(69-95) 153/73 (05/14 1445) SpO2:  [95 %-100 %] 99 % (05/14 1445) Constitutional:  Alert and oriented, No  acute distress Cardiovascular: Regular rate and rhythm, No JVD Respiratory: Normal respiratory effort, Lungs clear bilaterally GI: Abdomen is soft, nontender, nondistended, no abdominal masses GU: 22 French three-way Foley catheter in place and draining clear irrigant with CBI Lymphatic: No lymphadenopathy Neurologic: Grossly intact, no focal deficits Psychiatric: Normal mood and affect  Laboratory Data:  Recent Labs    05/01/21 0805  WBC 6.4  HGB 9.9*  HCT 29.6*  PLT 285    Recent Labs    05/01/21 0805  NA 122*  K 3.4*  CL 90*  GLUCOSE 101*  BUN 12  CALCIUM 8.0*  CREATININE 2.61*     Results for orders placed or performed during the hospital encounter of 05/01/21 (from the past 24 hour(s))  Basic metabolic panel     Status: Abnormal   Collection Time: 05/01/21  8:05 AM  Result Value Ref Range   Sodium 122 (L) 135 - 145 mmol/L   Potassium 3.4 (L) 3.5 - 5.1 mmol/L   Chloride 90 (L) 98 - 111 mmol/L   CO2 21 (L) 22 - 32 mmol/L   Glucose, Bld 101 (H) 70 - 99 mg/dL   BUN 12 8 - 23 mg/dL   Creatinine, Ser 2.61 (H) 0.61 - 1.24 mg/dL   Calcium 8.0 (L) 8.9 - 10.3 mg/dL   GFR, Estimated 25 (L) >60 mL/min   Anion gap 11 5 - 15  CBC     Status: Abnormal   Collection Time: 05/01/21  8:05 AM  Result Value Ref Range   WBC 6.4 4.0 - 10.5 K/uL   RBC 3.49 (L) 4.22 - 5.81 MIL/uL   Hemoglobin 9.9 (L) 13.0 - 17.0 g/dL   HCT 29.6 (L) 39.0 - 52.0 %   MCV 84.8 80.0 - 100.0 fL   MCH 28.4 26.0 - 34.0 pg   MCHC 33.4 30.0 - 36.0 g/dL   RDW 13.8 11.5 - 15.5 %   Platelets 285 150 - 400 K/uL   nRBC 0.0 0.0 - 0.2 %   No results found for this or any previous visit (from the past 240 hour(s)).  Renal Function: Recent Labs    05/01/21 0805  CREATININE 2.61*   CrCl cannot be calculated (Unknown ideal weight.).  Radiologic Imaging: No results found.  I independently reviewed the above imaging studies.  Impression/Recommendation 75 year old male with acute urinary retention and  gross hematuria  -Keep Foley catheter in place. -Start tamsulosin 0.4 mg once daily -Start empiric course of Rocephin/Keflex -Follow-up as an outpatient for cystoscopy and cross-sectional imaging  Ellison Hughs, [facility] 05/01/2021, 3:16 PM

## 2021-05-01 NOTE — ED Notes (Signed)
Attempted to irrigate foley manually. Able to remove and dislodge some clots. Provider at bedside. Waiting for materials from materials management for bladder irrigation.

## 2021-05-01 NOTE — ED Triage Notes (Signed)
Pt reports urinary retention with hx of same. Unable to urinate x 24 hours. Emesis x 1 this morning as well.

## 2021-05-01 NOTE — ED Provider Notes (Signed)
I personally evaluated the patient during the encounter and completed a history, physical, procedures, medical decision making to contribute to the overall care of the patient and decision making for the patient briefly, the patient is a 75 y.o. male hematuria.  Urinary retention.  History of the same recently.  Had a Foley catheter removed several days ago after having urinary retention.  New Foley was put in but still large amount of clots and unable to empty the bladder.  650 cc were found on bladder scan.  Sodium is 122, sodium was 124 several weeks ago.  Overall has a history of low sodium.  Creatinine mildly elevated from baseline.  Hemoglobin stable.  Has not had cystoscopy.  No smoking history.  Not having any pain.  Overall with a 45 [place] urinary catheter was unable to flush blood clots.  We will try 23 French three-way catheter and attempt to irrigate the bladder.  Urology has been consulted.  May need continuous irrigation if this does not work.  Please see PA note for further results, evaluation, disposition of the patient.  Overall sodium is chronic and do not think he needs any further work-up from an emergent standpoint on that.  We will need to follow-up with PCP about that.  This chart was dictated using voice recognition software.  Despite best efforts to proofread,  errors can occur which can change the documentation meaning.     EKG Interpretation None           Lennice Sites, DO 05/01/21 1126

## 2021-05-01 NOTE — ED Notes (Signed)
Plan is to exchange foley to a 3 way coude 22 french per urology for irrigation. Materials ordered by Financial controller from Pharmacist, community. Waiting for foley at this time.

## 2021-05-01 NOTE — Discharge Instructions (Addendum)
Please take Flomax as directed.  Please take Keflex as antibiotic for the entire course.     Please follow-up with urology.  You may follow-up with either Dr. Gilford Rile since he offered to take you as a patient or you may follow-up with your [facility] urologist.

## 2021-05-01 NOTE — ED Provider Notes (Signed)
  Physical Exam  BP (!) 147/82 (BP Location: Right Arm)   Pulse 74   Temp 97.8 F (36.6 C) (Oral)   Resp 20   SpO2 100%   Physical Exam Constitutional:      General: He is not in acute distress.    Appearance: He is not toxic-appearing.  Cardiovascular:     Rate and Rhythm: Normal rate.  Abdominal:     Comments: Nontender.  No distention.  No suprapubic fullness.  Genitourinary:    Comments: Foley catheter draining clearish pink urine, no further clots.  Continued to have urine output after bag was changed. Neurological:     Mental Status: He is alert.     ED Course/Procedures   Clinical Course as of 05/01/21 1826  Sat May 01, 2021  9242 Chronic anemia with no significant changes. [WF]  0929 BMP with remarkable abnormalities consisting of hyponatremia which is not significantly far from patient's baseline and seems to be a chronic ongoing issue for patient however he also has an increase in creatinine from 2 weeks ago.  2 weeks ago creatinine was 1.8 it is now 2.61 [WF]  1120 Discussed with Dr. Gilford Rile of urology who recommended 22 French catheter with three-way valve to irrigate.  From a urology standpoint does not need to be admitted to the hospital. [WF]    Clinical Course User Index [WF] Tedd Sias, [place]    Procedures  MDM  Pt presents with urinary retention, clogged foley catheter. Inc Cr on labs. Na low, chronically low. Cr elevated compared to baseline but not severely. Getting CVI with 3-way valve. Urology has cleared for d/c after this.  Stable on reevaluation.  No signs of obstruction with Foley continues to drain after 2 L of irrigation.  Urine is pink and I discussed the expected course of clearing with the family as well as needs for presentation including worsening bleeding, obstruction, severe pain, or other emergencies.  Patient already has an appointment to follow-up with his urologist next week.      Aris Lot, MD 05/01/21 [place]     Isla Pence, MD 05/01/21 [phone]

## 2021-05-01 NOTE — ED Notes (Signed)
CBI initiated with no success. Attempted to manually irrigate bladder with success. Able to pull out moderate amount of blood clots. But when CBI initiated, no flow noted. MD notified. Charge nurse notified to double check CBI set and process. MD aware of unsuccessful CBI at this time.

## 2021-05-01 NOTE — ED Notes (Signed)
Unable to obtain UA due to when foley placed, small amount of blood clots aspirated. Attempted to irrigate foley with MD at bedside. Unable to get any urine return. Pt complains of lower abdominal to pelvic pain. Hx of bph and hx of same. Reports recently had a foley in place with same issue. Unable to urinate since last night. Bladder scan showed 6100ml. Will continue to monitor.

## 2021-05-01 NOTE — ED Notes (Signed)
2nd liter initiated for bladder irrigation. Pt tolerating well. Pink to light red colored drainage.

## 2021-05-02 DIAGNOSIS — R338 Other retention of urine: Secondary | ICD-10-CM | POA: Diagnosis not present

## 2021-05-02 DIAGNOSIS — R1084 Generalized abdominal pain: Secondary | ICD-10-CM | POA: Diagnosis not present

## 2021-05-02 DIAGNOSIS — R1030 Lower abdominal pain, unspecified: Secondary | ICD-10-CM | POA: Diagnosis not present

## 2021-05-02 DIAGNOSIS — R310 Gross hematuria: Secondary | ICD-10-CM | POA: Diagnosis not present

## 2021-05-02 DIAGNOSIS — Z743 Need for continuous supervision: Secondary | ICD-10-CM | POA: Diagnosis not present

## 2021-05-02 DIAGNOSIS — I4891 Unspecified atrial fibrillation: Secondary | ICD-10-CM | POA: Diagnosis not present

## 2021-05-02 DIAGNOSIS — I4510 Unspecified right bundle-branch block: Secondary | ICD-10-CM | POA: Diagnosis not present

## 2021-05-03 ENCOUNTER — Emergency Department (HOSPITAL_COMMUNITY): Payer: Medicare HMO

## 2021-05-03 ENCOUNTER — Encounter (HOSPITAL_COMMUNITY): Payer: Self-pay

## 2021-05-03 ENCOUNTER — Other Ambulatory Visit: Payer: Self-pay

## 2021-05-03 ENCOUNTER — Inpatient Hospital Stay (HOSPITAL_COMMUNITY): Payer: Medicare HMO

## 2021-05-03 ENCOUNTER — Inpatient Hospital Stay (HOSPITAL_COMMUNITY)
Admission: EM | Admit: 2021-05-03 | Discharge: 2021-05-12 | DRG: 871 | Disposition: A | Discharge status: Home / Self Care | Payer: Medicare HMO | Attending: Internal Medicine | Admitting: Internal Medicine

## 2021-05-03 DIAGNOSIS — R6521 Severe sepsis with septic shock: Secondary | ICD-10-CM | POA: Diagnosis present

## 2021-05-03 DIAGNOSIS — N189 Chronic kidney disease, unspecified: Secondary | ICD-10-CM

## 2021-05-03 DIAGNOSIS — N12 Tubulo-interstitial nephritis, not specified as acute or chronic: Secondary | ICD-10-CM | POA: Diagnosis not present

## 2021-05-03 DIAGNOSIS — Z905 Acquired absence of kidney: Secondary | ICD-10-CM | POA: Diagnosis not present

## 2021-05-03 DIAGNOSIS — N400 Enlarged prostate without lower urinary tract symptoms: Secondary | ICD-10-CM | POA: Diagnosis not present

## 2021-05-03 DIAGNOSIS — C9000 Multiple myeloma not having achieved remission: Secondary | ICD-10-CM | POA: Diagnosis present

## 2021-05-03 DIAGNOSIS — R578 Other shock: Secondary | ICD-10-CM | POA: Diagnosis present

## 2021-05-03 DIAGNOSIS — N179 Acute kidney failure, unspecified: Secondary | ICD-10-CM | POA: Diagnosis present

## 2021-05-03 DIAGNOSIS — E871 Hypo-osmolality and hyponatremia: Secondary | ICD-10-CM | POA: Diagnosis present

## 2021-05-03 DIAGNOSIS — E872 Acidosis: Secondary | ICD-10-CM | POA: Diagnosis present

## 2021-05-03 DIAGNOSIS — Z85528 Personal history of other malignant neoplasm of kidney: Secondary | ICD-10-CM

## 2021-05-03 DIAGNOSIS — Z8249 Family history of ischemic heart disease and other diseases of the circulatory system: Secondary | ICD-10-CM | POA: Diagnosis not present

## 2021-05-03 DIAGNOSIS — Z79899 Other long term (current) drug therapy: Secondary | ICD-10-CM | POA: Diagnosis not present

## 2021-05-03 DIAGNOSIS — D62 Acute posthemorrhagic anemia: Secondary | ICD-10-CM | POA: Diagnosis not present

## 2021-05-03 DIAGNOSIS — R338 Other retention of urine: Secondary | ICD-10-CM

## 2021-05-03 DIAGNOSIS — R000 Tachycardia, unspecified: Secondary | ICD-10-CM | POA: Diagnosis not present

## 2021-05-03 DIAGNOSIS — Z7982 Long term (current) use of aspirin: Secondary | ICD-10-CM | POA: Diagnosis not present

## 2021-05-03 DIAGNOSIS — N1831 Chronic kidney disease, stage 3a: Secondary | ICD-10-CM | POA: Diagnosis present

## 2021-05-03 DIAGNOSIS — N136 Pyonephrosis: Secondary | ICD-10-CM | POA: Diagnosis present

## 2021-05-03 DIAGNOSIS — R310 Gross hematuria: Secondary | ICD-10-CM | POA: Diagnosis present

## 2021-05-03 DIAGNOSIS — E876 Hypokalemia: Secondary | ICD-10-CM | POA: Diagnosis present

## 2021-05-03 DIAGNOSIS — Z20822 Contact with and (suspected) exposure to covid-19: Secondary | ICD-10-CM | POA: Diagnosis present

## 2021-05-03 DIAGNOSIS — R571 Hypovolemic shock: Secondary | ICD-10-CM | POA: Diagnosis not present

## 2021-05-03 DIAGNOSIS — Z880 Allergy status to penicillin: Secondary | ICD-10-CM | POA: Diagnosis not present

## 2021-05-03 DIAGNOSIS — Z91040 Latex allergy status: Secondary | ICD-10-CM

## 2021-05-03 DIAGNOSIS — R319 Hematuria, unspecified: Secondary | ICD-10-CM | POA: Diagnosis present

## 2021-05-03 DIAGNOSIS — A419 Sepsis, unspecified organism: Principal | ICD-10-CM | POA: Insufficient documentation

## 2021-05-03 DIAGNOSIS — R339 Retention of urine, unspecified: Secondary | ICD-10-CM | POA: Diagnosis not present

## 2021-05-03 DIAGNOSIS — Z87440 Personal history of urinary (tract) infections: Secondary | ICD-10-CM | POA: Diagnosis not present

## 2021-05-03 DIAGNOSIS — R739 Hyperglycemia, unspecified: Secondary | ICD-10-CM | POA: Diagnosis not present

## 2021-05-03 DIAGNOSIS — I129 Hypertensive chronic kidney disease with stage 1 through stage 4 chronic kidney disease, or unspecified chronic kidney disease: Secondary | ICD-10-CM | POA: Diagnosis present

## 2021-05-03 DIAGNOSIS — K432 Incisional hernia without obstruction or gangrene: Secondary | ICD-10-CM | POA: Diagnosis present

## 2021-05-03 DIAGNOSIS — N401 Enlarged prostate with lower urinary tract symptoms: Secondary | ICD-10-CM | POA: Diagnosis not present

## 2021-05-03 DIAGNOSIS — R6520 Severe sepsis without septic shock: Secondary | ICD-10-CM | POA: Diagnosis not present

## 2021-05-03 DIAGNOSIS — T83198A Other mechanical complication of other urinary devices and implants, initial encounter: Secondary | ICD-10-CM | POA: Diagnosis not present

## 2021-05-03 DIAGNOSIS — R3914 Feeling of incomplete bladder emptying: Secondary | ICD-10-CM | POA: Diagnosis not present

## 2021-05-03 LAB — CBC
HCT: 21.4 % — ABNORMAL LOW (ref 39.0–52.0)
Hemoglobin: 7.5 g/dL — ABNORMAL LOW (ref 13.0–17.0)
MCH: 29.2 pg (ref 26.0–34.0)
MCHC: 35 g/dL (ref 30.0–36.0)
MCV: 83.3 fL (ref 80.0–100.0)
Platelets: 184 K/uL (ref 150–400)
RBC: 2.57 MIL/uL — ABNORMAL LOW (ref 4.22–5.81)
RDW: 13.3 % (ref 11.5–15.5)
WBC: 20 K/uL — ABNORMAL HIGH (ref 4.0–10.5)
nRBC: 0 % (ref 0.0–0.2)

## 2021-05-03 LAB — COMPREHENSIVE METABOLIC PANEL
ALT: 12 U/L (ref 0–44)
AST: 30 U/L (ref 15–41)
Albumin: 2.6 g/dL — ABNORMAL LOW (ref 3.5–5.0)
Alkaline Phosphatase: 36 U/L — ABNORMAL LOW (ref 38–126)
Anion gap: 19 — ABNORMAL HIGH (ref 5–15)
BUN: 26 mg/dL — ABNORMAL HIGH (ref 8–23)
CO2: 9 mmol/L — ABNORMAL LOW (ref 22–32)
Calcium: 7.4 mg/dL — ABNORMAL LOW (ref 8.9–10.3)
Chloride: 90 mmol/L — ABNORMAL LOW (ref 98–111)
Creatinine, Ser: 3.93 mg/dL — ABNORMAL HIGH (ref 0.61–1.24)
GFR, Estimated: 15 mL/min — ABNORMAL LOW (ref >60)
Glucose, Bld: 238 mg/dL — ABNORMAL HIGH (ref 70–99)
Potassium: 3.3 mmol/L — ABNORMAL LOW (ref 3.5–5.1)
Sodium: 118 mmol/L — CL (ref 135–145)
Total Bilirubin: 0.6 mg/dL (ref 0.3–1.2)
Total Protein: 5.7 g/dL — ABNORMAL LOW (ref 6.5–8.1)

## 2021-05-03 LAB — LACTIC ACID, PLASMA
Lactic Acid, Venous: 10.2 mmol/L (ref 0.5–1.9)
Lactic Acid, Venous: 7.9 mmol/L (ref 0.5–1.9)
Lactic Acid, Venous: 1.5 mmol/L (ref 0.5–1.9)

## 2021-05-03 LAB — CBC WITH DIFFERENTIAL/PLATELET
Abs Immature Granulocytes: 0.38 10*3/uL — ABNORMAL HIGH (ref 0.00–0.07)
Basophils Absolute: 0 10*3/uL (ref 0.0–0.1)
Basophils Relative: 0 %
Eosinophils Absolute: 0 10*3/uL (ref 0.0–0.5)
Eosinophils Relative: 0 %
HCT: 20.2 % — ABNORMAL LOW (ref 39.0–52.0)
Hemoglobin: 6.4 g/dL — CL (ref 13.0–17.0)
Immature Granulocytes: 2 %
Lymphocytes Relative: 5 %
Lymphs Abs: 1.2 10*3/uL (ref 0.7–4.0)
MCH: 28.6 pg (ref 26.0–34.0)
MCHC: 31.7 g/dL (ref 30.0–36.0)
MCV: 90.2 fL (ref 80.0–100.0)
Monocytes Absolute: 1.8 10*3/uL — ABNORMAL HIGH (ref 0.1–1.0)
Monocytes Relative: 8 %
Neutro Abs: 20.6 10*3/uL — ABNORMAL HIGH (ref 1.7–7.7)
Neutrophils Relative %: 85 %
Platelets: 268 10*3/uL (ref 150–400)
RBC: 2.24 MIL/uL — ABNORMAL LOW (ref 4.22–5.81)
RDW: 14 % (ref 11.5–15.5)
WBC: 24 10*3/uL — ABNORMAL HIGH (ref 4.0–10.5)
nRBC: 0 % (ref 0.0–0.2)

## 2021-05-03 LAB — GLUCOSE, CAPILLARY
Glucose-Capillary: 148 mg/dL — ABNORMAL HIGH (ref 70–99)
Glucose-Capillary: 88 mg/dL (ref 70–99)
Glucose-Capillary: 92 mg/dL (ref 70–99)
Glucose-Capillary: 92 mg/dL (ref 70–99)
Glucose-Capillary: 98 mg/dL (ref 70–99)
Glucose-Capillary: 102 mg/dL — ABNORMAL HIGH (ref 70–99)
Glucose-Capillary: 106 mg/dL — ABNORMAL HIGH (ref 70–99)

## 2021-05-03 LAB — I-STAT CHEM 8, ED
BUN: 27 mg/dL — ABNORMAL HIGH (ref 8–23)
Calcium, Ion: 0.92 mmol/L — ABNORMAL LOW (ref 1.15–1.40)
Chloride: 91 mmol/L — ABNORMAL LOW (ref 98–111)
Creatinine, Ser: 3.9 mg/dL — ABNORMAL HIGH (ref 0.61–1.24)
Glucose, Bld: 228 mg/dL — ABNORMAL HIGH (ref 70–99)
HCT: 19 % — ABNORMAL LOW (ref 39.0–52.0)
Hemoglobin: 6.5 g/dL — CL (ref 13.0–17.0)
Potassium: 3.3 mmol/L — ABNORMAL LOW (ref 3.5–5.1)
Sodium: 119 mmol/L — CL (ref 135–145)
TCO2: 12 mmol/L — ABNORMAL LOW (ref 22–32)

## 2021-05-03 LAB — HEMOGLOBIN A1C
Hgb A1c MFr Bld: 5.8 % — ABNORMAL HIGH (ref 4.8–5.6)
Mean Plasma Glucose: 119.76 mg/dL

## 2021-05-03 LAB — BASIC METABOLIC PANEL WITH GFR
Anion gap: 9 (ref 5–15)
BUN: 24 mg/dL — ABNORMAL HIGH (ref 8–23)
CO2: 20 mmol/L — ABNORMAL LOW (ref 22–32)
Calcium: 7.2 mg/dL — ABNORMAL LOW (ref 8.9–10.3)
Chloride: 95 mmol/L — ABNORMAL LOW (ref 98–111)
Creatinine, Ser: 3.02 mg/dL — ABNORMAL HIGH (ref 0.61–1.24)
GFR, Estimated: 21 mL/min — ABNORMAL LOW
Glucose, Bld: 107 mg/dL — ABNORMAL HIGH (ref 70–99)
Potassium: 3.9 mmol/L (ref 3.5–5.1)
Sodium: 124 mmol/L — ABNORMAL LOW (ref 135–145)

## 2021-05-03 LAB — RESP PANEL BY RT-PCR (FLU A&B, COVID) ARPGX2
Influenza A by PCR: NEGATIVE
Influenza B by PCR: NEGATIVE
SARS Coronavirus 2 by RT PCR: NEGATIVE

## 2021-05-03 LAB — PROTIME-INR
INR: 1.4 — ABNORMAL HIGH (ref 0.8–1.2)
Prothrombin Time: 16.9 seconds — ABNORMAL HIGH (ref 11.4–15.2)

## 2021-05-03 LAB — HEMOGLOBIN AND HEMATOCRIT, BLOOD
HCT: 23 % — ABNORMAL LOW (ref 39.0–52.0)
Hemoglobin: 8.2 g/dL — ABNORMAL LOW (ref 13.0–17.0)

## 2021-05-03 LAB — MRSA PCR SCREENING: MRSA by PCR: NEGATIVE

## 2021-05-03 LAB — PREPARE RBC (CROSSMATCH)

## 2021-05-03 LAB — MAGNESIUM: Magnesium: 1.6 mg/dL — ABNORMAL LOW (ref 1.7–2.4)

## 2021-05-03 LAB — APTT: aPTT: 29 seconds (ref 24–36)

## 2021-05-03 LAB — PHOSPHORUS: Phosphorus: 3.5 mg/dL (ref 2.5–4.6)

## 2021-05-03 MED ORDER — TRAMADOL HCL 50 MG PO TABS
25.0000 mg | ORAL_TABLET | Freq: Two times a day (BID) | ORAL | Status: DC | PRN
  Administered 2021-05-03 – 2021-05-04 (×2): 50 mg via ORAL
  Filled 2021-05-03 (×2): qty 1

## 2021-05-03 MED ORDER — INSULIN ASPART 100 UNIT/ML IJ SOLN
0.0000 [IU] | INTRAMUSCULAR | Status: DC
  Administered 2021-05-04 – 2021-05-06 (×8): 1 [IU] via SUBCUTANEOUS
  Administered 2021-05-07: 2 [IU] via SUBCUTANEOUS
  Administered 2021-05-08 – 2021-05-10 (×8): 1 [IU] via SUBCUTANEOUS

## 2021-05-03 MED ORDER — LOPERAMIDE HCL 2 MG PO CAPS
2.0000 mg | ORAL_CAPSULE | Freq: Once | ORAL | Status: AC
  Administered 2021-05-03: 2 mg via ORAL
  Filled 2021-05-03: qty 1

## 2021-05-03 MED ORDER — SODIUM CHLORIDE 0.9% IV SOLUTION: Freq: Once | INTRAVENOUS | Status: AC

## 2021-05-03 MED ORDER — DOCUSATE SODIUM 100 MG PO CAPS
100.0000 mg | ORAL_CAPSULE | Freq: Two times a day (BID) | ORAL | Status: DC | PRN
  Administered 2021-05-06: 100 mg via ORAL
  Filled 2021-05-03: qty 1

## 2021-05-03 MED ORDER — ACETAMINOPHEN 325 MG PO TABS
650.0000 mg | ORAL_TABLET | Freq: Four times a day (QID) | ORAL | Status: DC | PRN
  Administered 2021-05-03 – 2021-05-08 (×3): 650 mg via ORAL
  Filled 2021-05-03 (×3): qty 2

## 2021-05-03 MED ORDER — LACTATED RINGERS IV SOLN: INTRAVENOUS | Status: AC

## 2021-05-03 MED ORDER — MAGNESIUM SULFATE 2 GM/50ML IV SOLN
2.0000 g | Freq: Once | INTRAVENOUS | Status: AC
  Administered 2021-05-03: 2 g via INTRAVENOUS
  Filled 2021-05-03: qty 50

## 2021-05-03 MED ORDER — SODIUM CHLORIDE 0.9 % IV SOLN
1.0000 g | INTRAVENOUS | Status: DC
  Administered 2021-05-03 – 2021-05-12 (×10): 1 g via INTRAVENOUS
  Filled 2021-05-03 (×6): qty 10
  Filled 2021-05-03: qty 1
  Filled 2021-05-03 (×3): qty 10
  Filled 2021-05-03: qty 1

## 2021-05-03 MED ORDER — POLYETHYLENE GLYCOL 3350 17 G PO PACK: 17.0000 g | PACK | Freq: Every day | ORAL | Status: DC | PRN

## 2021-05-03 MED ORDER — CHLORHEXIDINE GLUCONATE CLOTH 2 % EX PADS
6.0000 | MEDICATED_PAD | Freq: Every day | CUTANEOUS | Status: DC
  Administered 2021-05-03 – 2021-05-06 (×5): 6 via TOPICAL

## 2021-05-03 NOTE — H&P (Signed)
Marcus Hatfield is an 75 y.o. male.   Chief Complaint: hematuria and painful abdominal distension HPI:  [name] is a 75 year old gentleman with PMH significant for hypertension, renal cell carcinoma of the right kidney in 2017 at [facility], CKD and  BPH with lower urinary track symptoms.  Patient presented to the ED with complaints of painful abdominal distension, hematuria and failure of foley to drain.  Patient was first seen in urgent care on 04/16/21 and treated for UTI. On 04/17/21, the patient presented to the ED with urinary retention and a foley cathter was placed, patient was discharged home and with outpatient urology follow up.  On 05/01/21, patient returned to the Ed with urinary retention.  His foley had been removed a few days prior. A new foley cath was place and patient was discharge home on Keflex and out patient urology follow up.  Patient now re presents in the EF with complaints of painful abdominal distension,hematuria and failure of foley to drain.  He was found to be hypotensive. He had gross hematuria and therefor Urology team was urgently consulted.  Dr Marcus Hatfield was at bedised lavaging and cleaning multiple clots and gross blood from the bladder.  Patient's hemoglobin had decreased from 9.9 to 6.5 in 48 hours.  He was being transfused when I saw him.  By the time I saw the patient, his abdominal distension was decompressed and he was no longer in pain.  He admitted to fever and chills but denied chest pain or shortness of breath.  He was referred for ICU admission because of shock.  Past Medical History:  Diagnosis Date  . Arthritis   . CKD (chronic kidney disease) stage 3, GFR 30-59 ml/min (HCC) 20015   Creat 1.9  . Hypertension   . Multiple myeloma ([place]) 2016   [facility] heme [facility]  . Prostate disorder 02/2017    Past Surgical History:  Procedure Laterality Date  . KNEE SURGERY     over ten years ago  . NEPHRECTOMY Right 03/2015    Family History   Problem Relation Age of Onset  . Diabetes Father   . Hypertension Brother    Social History:  reports that he has never smoked. He has never used smokeless tobacco. He reports that he does not drink alcohol and does not use drugs.  Allergies:  Allergies  Allergen Reactions  . Penicillins Hives    Has patient had a PCN reaction causing immediate rash, facial/tongue/throat swelling, SOB or lightheadedness with hypotension:Yes Has patient had a PCN reaction causing severe rash involving mucus membranes or skin necrosis: Yes Has patient had a PCN reaction that required hospitalization No Has patient had a PCN reaction occurring within the last 10 years: No If all of the above answers are "NO", then may proceed with Cephalosporin use.   . Latex Rash    (Not in a hospital admission)   Results for orders placed or performed during the hospital encounter of 05/03/21 (from the past 48 hour(s))  Type and screen [place]     Status: None (Preliminary result)   Collection Time: 05/03/21 12:17 AM  Result Value Ref Range   ABO/RH(D) B POS    Antibody Screen NEG    Sample Expiration 05/06/2021,2359    Unit Number [id number]    Blood Component Type RBC LR PHER1    Unit division 00    Status of Unit ALLOCATED    Transfusion Status OK TO TRANSFUSE  Crossmatch Result Compatible    Unit Number [id number]    Blood Component Type RBC LR PHER2    Unit division 00    Status of Unit ISSUED    Transfusion Status OK TO TRANSFUSE    Crossmatch Result      Compatible Performed at [facility], [place] [address], [place], [place] [address]   Resp Panel by RT-PCR (Flu A&B, Covid) Nasopharyngeal Swab     Status: None   Collection Time: 05/03/21 12:24 AM   Specimen: Nasopharyngeal Swab; Nasopharyngeal(NP) swabs in vial transport medium  Result Value Ref Range   SARS Coronavirus 2 by RT PCR NEGATIVE NEGATIVE    Comment: (NOTE) SARS-CoV-2 target nucleic acids are NOT  DETECTED.  The SARS-CoV-2 RNA is generally detectable in upper respiratory specimens during the acute phase of infection. The lowest concentration of SARS-CoV-2 viral copies this assay can detect is 138 copies/mL. A negative result does not preclude SARS-Cov-2 infection and should not be used as the sole basis for treatment or other patient management decisions. A negative result may occur with  improper specimen collection/handling, submission of specimen other than nasopharyngeal swab, presence of viral mutation(s) within the areas targeted by this assay, and inadequate number of viral copies(<138 copies/mL). A negative result must be combined with clinical observations, patient history, and epidemiological information. The expected result is Negative.  Fact Sheet for Patients:  EntrepreneurPulse.com.au  Fact Sheet for Healthcare Providers:  IncredibleEmployment.be  This test is no t yet approved or cleared by the [place] FDA and  has been authorized for detection and/or diagnosis of SARS-CoV-2 by FDA under an Emergency Use Authorization (EUA). This EUA will remain  in effect (meaning this test can be used) for the duration of the COVID-19 declaration under Section 564(b)(1) of the Act, 21 U.S.C.section 360bbb-3(b)(1), unless the authorization is terminated  or revoked sooner.       Influenza A by PCR NEGATIVE NEGATIVE   Influenza B by PCR NEGATIVE NEGATIVE    Comment: (NOTE) The Xpert Xpress SARS-CoV-2/FLU/RSV plus assay is intended as an aid in the diagnosis of influenza from Nasopharyngeal swab specimens and should not be used as a sole basis for treatment. Nasal washings and aspirates are unacceptable for Xpert Xpress SARS-CoV-2/FLU/RSV testing.  Fact Sheet for Patients: EntrepreneurPulse.com.au  Fact Sheet for Healthcare Providers: IncredibleEmployment.be  This test is not yet approved or  cleared by the [place] FDA and has been authorized for detection and/or diagnosis of SARS-CoV-2 by FDA under an Emergency Use Authorization (EUA). This EUA will remain in effect (meaning this test can be used) for the duration of the COVID-19 declaration under Section 564(b)(1) of the Act, 21 U.S.C. section 360bbb-3(b)(1), unless the authorization is terminated or revoked.  Performed at [facility], [place] [address] [place], [place] [address]   Lactic acid, plasma     Status: Abnormal   Collection Time: 05/03/21 12:29 AM  Result Value Ref Range   Lactic Acid, Venous 10.2 (HH) 0.5 - 1.9 mmol/L    Comment: CRITICAL RESULT CALLED TO, READ BACK BY AND VERIFIED WITH:  Britt Boozer RN _0  05/03/21 K. SANDERS Performed at [facility], [place] [address] [place], [place] [address]   Comprehensive metabolic panel     Status: Abnormal   Collection Time: 05/03/21 12:29 AM  Result Value Ref Range   Sodium 118 (LL) 135 - 145 mmol/L    Comment: CRITICAL RESULT CALLED TO, READ BACK BY AND VERIFIED WITH:  K. ISLEY RN _0  05/03/21 K. SANDERS    Potassium 3.3 (L) 3.5 - 5.1 mmol/L   Chloride 90 (L) 98 - 111 mmol/L   CO2 9 (L) 22 - 32 mmol/L   Glucose, Bld 238 (H) 70 - 99 mg/dL    Comment: Glucose reference range applies only to samples taken after fasting for at least 8 hours.   BUN 26 (H) 8 - 23 mg/dL   Creatinine, Ser 3.93 (H) 0.61 - 1.24 mg/dL   Calcium 7.4 (L) 8.9 - 10.3 mg/dL   Total Protein 5.7 (L) 6.5 - 8.1 g/dL   Albumin 2.6 (L) 3.5 - 5.0 g/dL   AST 30 15 - 41 U/L   ALT 12 0 - 44 U/L   Alkaline Phosphatase 36 (L) 38 - 126 U/L   Total Bilirubin 0.6 0.3 - 1.2 mg/dL   GFR, Estimated 15 (L) >60 mL/min    Comment: (NOTE) Calculated using the CKD-EPI Creatinine Equation (2021)    Anion gap 19 (H) 5 - 15    Comment: Performed at [facility], [place] [address], [place], [place] [address]  CBC WITH DIFFERENTIAL     Status: Abnormal   Collection Time: 05/03/21 12:29 AM   Result Value Ref Range   WBC 24.0 (H) 4.0 - 10.5 K/uL   RBC 2.24 (L) 4.22 - 5.81 MIL/uL   Hemoglobin 6.4 (LL) 13.0 - 17.0 g/dL    Comment: REPEATED TO VERIFY THIS CRITICAL RESULT HAS VERIFIED AND BEEN CALLED TO KIMBERLY ISLAY,RN BY AJA HUGHES ON 05 16 2022 AT 0056, AND HAS BEEN READ BACK.     HCT 20.2 (L) 39.0 - 52.0 %   MCV 90.2 80.0 - 100.0 fL   MCH 28.6 26.0 - 34.0 pg   MCHC 31.7 30.0 - 36.0 g/dL   RDW 14.0 11.5 - 15.5 %   Platelets 268 150 - 400 K/uL   nRBC 0.0 0.0 - 0.2 %   Neutrophils Relative % 85 %   Neutro Abs 20.6 (H) 1.7 - 7.7 K/uL   Lymphocytes Relative 5 %   Lymphs Abs 1.2 0.7 - 4.0 K/uL   Monocytes Relative 8 %   Monocytes Absolute 1.8 (H) 0.1 - 1.0 K/uL   Eosinophils Relative 0 %   Eosinophils Absolute 0.0 0.0 - 0.5 K/uL   Basophils Relative 0 %   Basophils Absolute 0.0 0.0 - 0.1 K/uL   Immature Granulocytes 2 %   Abs Immature Granulocytes 0.38 (H) 0.00 - 0.07 K/uL    Comment: Performed at [facility] [address], [place], [place] [address]  Protime-INR     Status: Abnormal   Collection Time: 05/03/21 12:29 AM  Result Value Ref Range   Prothrombin Time 16.9 (H) 11.4 - 15.2 seconds   INR 1.4 (H) 0.8 - 1.2    Comment: (NOTE) INR goal varies based on device and disease states. Performed at [facility], [place] [address], [place], [place] [address]   APTT     Status: None   Collection Time: 05/03/21 12:29 AM  Result Value Ref Range   aPTT 29 24 - 36 seconds    Comment: Performed at [facility] [address] [place], [place] [address]  I-Stat Chem 8, ED     Status: Abnormal   Collection Time: 05/03/21 12:52 AM  Result Value Ref Range   Sodium 119 (LL) 135 - 145 mmol/L   Potassium 3.3 (L) 3.5 - 5.1 mmol/L  Chloride 91 (L) 98 - 111 mmol/L   BUN 27 (H) 8 - 23 mg/dL   Creatinine, Ser 3.90 (H) 0.61 - 1.24 mg/dL   Glucose, Bld 228 (H) 70 - 99 mg/dL    Comment: Glucose reference range applies only to samples taken after fasting for at  least 8 hours.   Calcium, Ion 0.92 (L) 1.15 - 1.40 mmol/L   TCO2 12 (L) 22 - 32 mmol/L   Hemoglobin 6.5 (LL) 13.0 - 17.0 g/dL   HCT 19.0 (L) 39.0 - 52.0 %   Comment NOTIFIED PHYSICIAN   Prepare RBC (crossmatch)     Status: None   Collection Time: 05/03/21  1:19 AM  Result Value Ref Range   Order Confirmation      ORDER PROCESSED BY BLOOD BANK Performed at [facility], [place] [address] [place] [address] [place], [place] [address]    No results found.  Review of Systems  Constitutional: Positive for fatigue and fever.  HENT: Negative.   Eyes: Negative.   Respiratory: Negative.   Cardiovascular: Negative.   Gastrointestinal: Positive for abdominal distention and abdominal pain.  Endocrine: Negative.   Genitourinary: Positive for decreased urine volume, difficulty urinating, dysuria, flank pain and hematuria.  Allergic/Immunologic: Negative.   Neurological: Positive for dizziness.  Hematological: Negative.   Psychiatric/Behavioral: Negative.     Blood pressure (!) 126/108, pulse 96, temperature (!) 97.5 F (36.4 C), temperature source Oral, resp. rate (!) 26, height _0  (1.753 m), weight 83.9 kg, SpO2 100 %. Physical Exam Vitals reviewed.  Constitutional:      Appearance: He is not ill-appearing.  HENT:     Head: Normocephalic and atraumatic.     Right Ear: Tympanic membrane normal.     Left Ear: Tympanic membrane normal.     Nose: Nose normal.     Mouth/Throat:     Mouth: Mucous membranes are moist.     Pharynx: Oropharynx is clear.  Eyes:     Extraocular Movements: Extraocular movements intact.     Pupils: Pupils are equal, round, and reactive to light.  Cardiovascular:     Rate and Rhythm: Normal rate and regular rhythm.     Pulses: Normal pulses.     Heart sounds: Normal heart sounds.  Pulmonary:     Effort: Pulmonary effort is normal.     Breath sounds: Normal breath sounds. No wheezing or rhonchi.  Abdominal:     General: Abdomen is flat. There is no distension.      Palpations: Abdomen is soft.     Tenderness: There is no abdominal tenderness.     Comments: Abdominal distension had resolved by the time I saw him as Urologist Dr. Junious Hatfield was at bedside decompressing the bladder with with lavage  Genitourinary:    Comments: Gross hematuria Musculoskeletal:        General: Normal range of motion.     Cervical back: Normal range of motion and neck supple.  Skin:    General: Skin is warm and dry.  Neurological:     General: No focal deficit present.     Mental Status: He is alert and oriented to person, place, and time.  Psychiatric:        Mood and Affect: Mood normal.      Assessment/Plan 1. Hemorrhagic shock secondary to acute blood loss anemia Plan: transfusion of PRBC, correct coags, source control.  2. Acute blood loss anemia secondary to gross hematuria Plan: Urologist Dr. Junious Hatfield at bedside doing lavage and decompressed  Bladder, CBI, continue Rocephin ABX pending culture results.  3. Septic shock ( in addition to hemorrhagic shock) secondary to pyelonephritis Plan: f/u cultures, continue Rocephin antibiotic, vasopressor as needed, trend lactic Acid and procalcitonin.  4. Gross hematuria R/O etiology I.e bladder injury/lesion - obstruction uropathy likely from BPH. Obstruction decompressed. Plan: CBI, cystoscopy/ intervention per urology teaam  5. Acute kidney injury superimposed on CKD secondary to obstructive Uropathy, ATN. No history of nephrolithiasis. - Hx multiple myeloma - Hx of R nephrectomy secondary to renal cell Ca - Hx BPH Plan: hydrate, trend Bun/Cretinine, avoid nephrotoxins, adjust meds per GFR.  6. Chronic hyponatremia corrected Na+ at baseline 121 Plan: avoid free excessive water.  7. Acute lactic acidosis secondary to acute blood loss anemia resulting in tissue Hypoperfusion; also from shock Plan: trend improvement as patient is adequately resuscitated and transfused.  8. Hypokalemia- K= 3.3 Plan: will  monitor, avoid over correction in situation of AKI.  9. Hx of hypertension Plan: continue to hold home meds   Drip: LR, CBI Lines: PIV Prophylaxis: SCD Code: FULL  Family: wife updated at bedside by myself and Dr. Junious Hatfield.  Thank you for allowing me the privilege to care for this patient.  I have dedicated a total of 75 minutes in critical care time minus all appropriate exclusions.    Lafayette Dragon, MD 05/03/2021, 2:57 AM

## 2021-05-03 NOTE — ED Notes (Signed)
Urologist is at bedside at this time.

## 2021-05-03 NOTE — Progress Notes (Signed)
[place] [facility] Progress Note Patient Name: Marcus Hatfield DOB: 25-Apr-1946 MRN: [id number]   Date of Service  05/03/2021  HPI/Events of Note  Patient is having diarrhea.  eICU Interventions  Imodium 2 mg po x 1 ordered.        Kerry Kass Bearett Porcaro 05/03/2021, 10:59 PM

## 2021-05-03 NOTE — ED Notes (Signed)
Foley catheter irrigated, foley is not returning all of solution irrigated. Hematuria is present.

## 2021-05-03 NOTE — Progress Notes (Signed)
Notified bedside nurse of need to administer fluid bolus and collect repeat Lactic acid.

## 2021-05-03 NOTE — Progress Notes (Signed)
[facility] Progress Note Patient Name: Javari Bufkin DOB: August 24, 1946 MRN: [id number]   Date of Service  05/03/2021  HPI/Events of Note  Brief new admit note:  75 y.o. male with a history of renal cell carcinoma involving the right kidney, status post lap radical nephrectomy in 2017 at [facility].  He also has a significant history of BPH with LUTS, CKD, HTN and multiple myeloma Now coming into ICU for septic shock from  Pyelonephritis/retension. Seen by urologist  Hypokalemia, Acute on CKD. Anemia- hematuria.   Camera: On nasal o2. Comfortable. HR 84, sats 99%. MAP 105   Data: Reviewed LA 7.7, down from > 10 CT renal pending. Sodium 119 up from 118.  Cr 3.9. Hg 6.5 stable from 6.4 earlier.   Neutrophilic Leukocytosis. Hg 6.5, plt normal.LFT covid/flu neg. INR 1.4 EKG: sinus tachy, PAC's. Artifact in baseline. RBBB/LAFB.  CT renal still pending to report.   Septic and hemorrhagic shock/anemia. Acute on CKD Chr Hyponatremia    eICU Interventions  -s/p 30 cc/kg fluids, abx. Follow LA, UOP - on SSI, goals < 180 - follow UOP - replace electrolytes - SCD as VTE prophylaxis - PRBC as needed for worsening anemia or bleeding. -asp and sz precautions Consider Nephro consult for low sodium.  Do not over correct over 6 to 8 meq in 24 to 32 hrs.     Intervention Category Major Interventions: Hypotension - evaluation and management;Sepsis - evaluation and management Evaluation Type: New Patient Evaluation  Elmer Sow 05/03/2021, 2:56 AM

## 2021-05-03 NOTE — ED Triage Notes (Signed)
EMS reports pt is from home. Diagnosed with UTI yesterday, 3 way foley placed and d/c'd home. Today cold and hematuria present. BP - 77/34, HR - 88 Afib, RR - 32.

## 2021-05-03 NOTE — Progress Notes (Signed)
NAME:  Dustin Bumbaugh MRN:  [id number] DOB:  Aug 15, 1946 LOS: 0 ADMISSION DATE:  05/03/2021 CONSULTATION DATE:  05/02/2021 REFERRING MD:  Ronnald Nian CHIEF COMPLAINT: Shock, hematuria  History of Present Illness:  75 year old male with PMHx significant for HTN, RCC of R kidney (s/p nephrectomy 2017 at [facility]), CKD stage IIIa andBPH with LUTS. Presented to the ED 5/15 with painful lower abdominal distention, gross hematuria and failure of foley to drain (previous episodes of urinary retention after UTI a few weeks prior to presentation, requiring Foley placement).  On arrival to ED, patient was noted to be hypotensive. Gross hematuria was noted prompting Urology consult. Dr Junious Silk assess patient and bedside where multiple clots and gross blood were lavaged from the bladder. Hgb decreased from 9.9 to 6.5 in 48H; 2U PRBCs were transfused. LA was noted to be 10.2.  PCCM consulted for ICU admission in the setting of shock.  Pertinent Medical History:   has a past medical history of Arthritis, CKD (chronic kidney disease) stage 3, GFR 30-59 ml/min ([place]) ([address]), Hypertension, Multiple myeloma ([place]) (2016), and Prostate disorder (02/2017). Renal Cell Carcinoma (R, s/p nephrectomy 2017)  Significant Hospital Events: Including procedures, antibiotic start and stop dates in addition to other pertinent events   . 5/15 - Admitted via ED for gross hematuria, hypotension, shock. Empiric Ceftriaxone started. . 5/16 -  Lactate normalized, Hgb 7.5 s/p 2U PRBCs. CBI per Urology.   Interim History / Subjective:  Admitted overnight No significant complaints this morning Mild suprapubic discomfort Mentating appropriately  Objective:  Blood pressure (!) 121/59, pulse 88, temperature 98.6 F (37 C), temperature source Oral, resp. rate (!) 23, height $RemoveBe'5\' 9"'[id number]$  (1.753 m), weight 83.9 kg, SpO2 99 %.        Intake/Output Summary (Last 24 hours) at 05/03/2021 1252 Last data filed at 05/03/2021 1248 Gross  per 24 hour  Intake 19700.53 ml  Output 16251 ml  Net 3449.53 ml   Filed Weights   05/03/21 0128  Weight: 83.9 kg   Physical Examination: General: Acutely ill-appearing male in NAD. HEENT: [place]/AT, anicteric sclera, PERRL, moist mucous membranes. Neuro: Awake, oriented x 4. Responds to verbal stimuli. Following commands consistently. Moves all 4 extremities spontaneously. Strength 5/5 in all 4 extremities. CV: RRR, no m/g/r. PULM: Breathing even and unlabored on RA. Lung fields CTAB. GI: Soft, nondistended. Mild TTP noted over suprapubic region. Normoactive bowel sounds. Extremities: Trace symmetric BLE edema noted. Skin: Warm/dry, no rashes. GU: Foley catheter in place with CBI, urine pink-tinged without clots.  Labs/imaging that I have personally reviewed: (right click and "Reselect all SmartList Selections" daily)  WBC 20 (24), H&H 7.5/21.4 (6.5/19), Plt 184  Na 124 (119), K 3.9 (3.3), CO2 20, BUN 24, Cr 3.02 Phos 3.5, Mg 1.6   LA 1.5 (7.9)  BCx NGTD  Resolved Hospital Problem List:     Assessment & Plan:  Hemorrhagic shock secondary to acute blood loss anemia versus septic shock in the setting of UTI Acute blood loss secondary to gross hematuria Lactic acidosis Presented to ED 5/15 for abdominal pain/distention, gross hematuria and inability to drain Foley. Recent ED encounters 4/30 (for UTI and urinary retention, Foley placed) and 5/14 (urinary retention s/p Foley removal). Noted to have gross hematuria with associated Hgb drop from 9.5 to 6.5. Hypotensive with LA 10. Received 2U PRBCs and urgent Urology consult. - S/p 2U PRBCs, LA normalized - Trend H&H Q6H - Transfuse for Hgb < 7 - Urology consulted, appreciate input - Continue CBI -  F/u Uro recs - Continue ceftriaxone  AKI on CKD stage IIIa Chronic hyponatremia Hypokalemia - Trend BMP - Replete electrolytes as indicated, avoid free water - Monitor I&Os - F/u urine studies - Avoid nephrotoxic agents as  able - Ensure adequate renal perfusion - Consider Nephro consult, pending Uro recs  History of RCC (R kidney s/p nephrectomy 2017 at [facility]) Renal cell carcinoma diagnosed 2017, followed by [facility]. S/p nephrectomy 2017. CT this admission demonstrating indeterminate exophytic ?solid lesion in the lower pole of the L kidney. - Recommend close follow up/workup for recurrence of RCC - Consider additional imaging for characterization of lesion  Hypertension - Hold home antihypertensives  BPH CT 5/16 demonstrating massive enlargement of the prostate gland with mass effect on the L UVJ contributing to mild L hydronephrosis. - Flomax resumption - Recs per Urology  Best Practice: (right click and "Reselect all SmartList Selections" daily)  Diet:  Oral Pain/Anxiety/Delirium protocol (if indicated): No VAP protocol (if indicated): Not indicated DVT prophylaxis: Contraindicated - active bleeding GI prophylaxis: N/A Glucose control:  SSI No Central venous access:  N/A Arterial line:  N/A Foley:  Yes, and it is still needed - CBI Mobility:  OOB  PT consulted: Yes Last date of multidisciplinary goals of care discussion [Pending] Code Status:  full code Disposition: ICU  Critical care time: N/A   Rhae Lerner [facility] 05/03/21 12:52 PM  Please see [email or web address] for pager details.  From 7A-7P if no response, please call [phone] After hours, please call E-Link [phone]

## 2021-05-03 NOTE — ED Provider Notes (Signed)
[facility] CARDIOVASCULAR ICU Provider Note  CSN: [id number] Arrival date & time: 05/03/21 0010  Chief Complaint(s) Hypotension and Hematuria  HPI Marcus Hatfield is a 75 y.o. male with h/o MM, RCC s/p right nephrectomy who is currently being treated for UTI complicated by hematuria and retention requiring indwelling foley. Patient is here for decreased urinary flow and leaking blood around catheter. Patient also complaining of generalized fatigue. EMS called and noted patient was hypotensive.  Patient complains of 1 day of worsening lower abd aching pain worse with palpation. No alleviating factore  The history is provided by the patient.    Past Medical History Past Medical History:  Diagnosis Date  . Arthritis   . CKD (chronic kidney disease) stage 3, GFR 30-59 ml/min (HCC) 20015   Creat 1.9  . Hypertension   . Multiple myeloma ([place]) 2016   [facility]  . Prostate disorder 02/2017   Patient Active Problem List   Diagnosis Date Noted  . Septic shock ([place]) 05/03/2021  . Acute urinary retention 03/11/2017  . Hyponatremia 03/11/2017  . HTN (hypertension) 03/11/2017  . CKD (chronic kidney disease) stage 3, GFR 30-59 ml/min (HCC) 03/11/2017  . External hemorrhoid, thrombosed 04/16/2012   Home Medication(s) Prior to Admission medications   Medication Sig Start Date End Date Taking? Authorizing Provider  acetaminophen (TYLENOL) 500 MG tablet Take 500 mg by mouth every 4 (four) hours as needed for pain.   Yes [provider]  amLODipine (NORVASC) 10 MG tablet Take 10 mg by mouth daily.  12/28/15  Yes [provider]  aspirin EC 81 MG tablet Take 81 mg by mouth daily. 06/22/16  Yes [provider]  cephALEXin (KEFLEX) 500 MG capsule Take 1 capsule (500 mg total) by mouth 3 (three) times daily for 5 days. Patient taking differently: Take 500 mg by mouth See admin instructions. Tid x 5 days 05/01/21 05/06/21 Yes Fondaw, Wylder S, PA  fluticasone (FLONASE) 50  MCG/ACT nasal spray Place 2 sprays into both nostrils daily as needed for allergies. 02/19/21  Yes [provider]  furosemide (LASIX) 20 MG tablet Take 1 tablet (20 mg total) by mouth daily. 03/13/17  Yes Barton Dubois, MD  irbesartan (AVAPRO) 300 MG tablet Take 300 mg by mouth daily.  12/28/15  Yes [provider]  lactulose (CHRONULAC) 10 GM/15ML solution Take 30 mLs by mouth daily as needed for constipation. 12/07/16  Yes [provider]  Multiple Vitamins-Minerals (MULTIVITAMIN WITH MINERALS) tablet Take 1 tablet by mouth daily.   Yes [provider]  tamsulosin (FLOMAX) 0.4 MG CAPS capsule Take 1 capsule (0.4 mg total) by mouth daily after supper. 05/01/21  Yes Isla Pence, MD  telmisartan (MICARDIS) 80 MG tablet Take 80 mg by mouth daily.   Yes [provider]  Past Surgical History Past Surgical History:  Procedure Laterality Date  . KNEE SURGERY     over ten years ago  . NEPHRECTOMY Right 03/2015   Family History Family History  Problem Relation Age of Onset  . Diabetes Father   . Hypertension Brother     Social History Social History   Tobacco Use  . Smoking status: Never Smoker  . Smokeless tobacco: Never Used  Substance Use Topics  . Alcohol use: No  . Drug use: No   Allergies Penicillins and Latex  Review of Systems Review of Systems All other systems are reviewed and are negative for acute change except as noted in the HPI  Physical Exam Vital Signs  I have reviewed the triage vital signs BP 115/77 (BP Location: Left Arm)   Pulse (!) 112   Temp 98.6 F (37 C) (Oral)   Resp (!) 22   Ht _0  (1.753 m)   Wt 83.9 kg   SpO2 100%   BMI 27.32 kg/m   Physical Exam Vitals reviewed.  Constitutional:      General: He is not in acute distress.    Appearance: He is well-developed. He is  not diaphoretic.  HENT:     Head: Normocephalic and atraumatic.     Nose: Nose normal.  Eyes:     General: No scleral icterus.       Right eye: No discharge.        Left eye: No discharge.     Conjunctiva/sclera: Conjunctivae normal.     Pupils: Pupils are equal, round, and reactive to light.  Cardiovascular:     Rate and Rhythm: Normal rate and regular rhythm.     Heart sounds: No murmur heard. No friction rub. No gallop.   Pulmonary:     Effort: Pulmonary effort is normal. No respiratory distress.     Breath sounds: Normal breath sounds. No stridor. No rales.  Abdominal:     General: There is distension (bladder).     Palpations: Abdomen is soft.     Tenderness: There is abdominal tenderness in the suprapubic area. There is no guarding or rebound.  Genitourinary:    Comments: 3 way 22 Fr foley with blood leaking around. 100 cc of blood in bag. Irrigated/aspiration returned large amount of clots. Musculoskeletal:        General: No tenderness.     Cervical back: Normal range of motion and neck supple.  Skin:    General: Skin is warm and dry.     Coloration: Skin is pale.     Findings: No erythema or rash.  Neurological:     Mental Status: He is alert and oriented to person, place, and time.     ED Results and Treatments Labs (all labs ordered are listed, but only abnormal results are displayed) Labs Reviewed  LACTIC ACID, PLASMA - Abnormal; Notable for the following components:      Result Value   Lactic Acid, Venous 10.2 (*)    All other components within normal limits  LACTIC ACID, PLASMA - Abnormal; Notable for the following components:   Lactic Acid, Venous 7.9 (*)    All other components within normal limits  COMPREHENSIVE METABOLIC PANEL - Abnormal; Notable for the following components:   Sodium 118 (*)    Potassium 3.3 (*)    Chloride 90 (*)    CO2 9 (*)    Glucose, Bld 238 (*)    BUN 26 (*)    Creatinine, Ser  3.93 (*)    Calcium 7.4 (*)    Total  Protein 5.7 (*)    Albumin 2.6 (*)    Alkaline Phosphatase 36 (*)    GFR, Estimated 15 (*)    Anion gap 19 (*)    All other components within normal limits  CBC WITH DIFFERENTIAL/PLATELET - Abnormal; Notable for the following components:   WBC 24.0 (*)    RBC 2.24 (*)    Hemoglobin 6.4 (*)    HCT 20.2 (*)    Neutro Abs 20.6 (*)    Monocytes Absolute 1.8 (*)    Abs Immature Granulocytes 0.38 (*)    All other components within normal limits  PROTIME-INR - Abnormal; Notable for the following components:   Prothrombin Time 16.9 (*)    INR 1.4 (*)    All other components within normal limits  BASIC METABOLIC PANEL - Abnormal; Notable for the following components:   Sodium 124 (*)    Chloride 95 (*)    CO2 20 (*)    Glucose, Bld 107 (*)    BUN 24 (*)    Creatinine, Ser 3.02 (*)    Calcium 7.2 (*)    GFR, Estimated 21 (*)    All other components within normal limits  MAGNESIUM - Abnormal; Notable for the following components:   Magnesium 1.6 (*)    All other components within normal limits  CBC - Abnormal; Notable for the following components:   WBC 20.0 (*)    RBC 2.57 (*)    Hemoglobin 7.5 (*)    HCT 21.4 (*)    All other components within normal limits  HEMOGLOBIN AND HEMATOCRIT, BLOOD - Abnormal; Notable for the following components:   Hemoglobin 8.2 (*)    HCT 23.0 (*)    All other components within normal limits  HEMOGLOBIN A1C - Abnormal; Notable for the following components:   Hgb A1c MFr Bld 5.8 (*)    All other components within normal limits  GLUCOSE, CAPILLARY - Abnormal; Notable for the following components:   Glucose-Capillary 148 (*)    All other components within normal limits  GLUCOSE, CAPILLARY - Abnormal; Notable for the following components:   Glucose-Capillary 102 (*)    All other components within normal limits  I-STAT CHEM 8, ED - Abnormal; Notable for the following components:   Sodium 119 (*)    Potassium 3.3 (*)    Chloride 91 (*)    BUN 27  (*)    Creatinine, Ser 3.90 (*)    Glucose, Bld 228 (*)    Calcium, Ion 0.92 (*)    TCO2 12 (*)    Hemoglobin 6.5 (*)    HCT 19.0 (*)    All other components within normal limits  CULTURE, BLOOD (ROUTINE X 2)  CULTURE, BLOOD (ROUTINE X 2)  RESP PANEL BY RT-PCR (FLU A&B, COVID) ARPGX2  MRSA PCR SCREENING  URINE CULTURE  APTT  PHOSPHORUS  LACTIC ACID, PLASMA  GLUCOSE, CAPILLARY  GLUCOSE, CAPILLARY  GLUCOSE, CAPILLARY  GLUCOSE, CAPILLARY  URINALYSIS, ROUTINE W REFLEX MICROSCOPIC  CBC  BASIC METABOLIC PANEL  MAGNESIUM  PHOSPHORUS  HEMOGLOBIN AND HEMATOCRIT, BLOOD  TYPE AND SCREEN  PREPARE RBC (CROSSMATCH)  EKG  EKG Interpretation  Date/Time:  Monday May 03 2021 00:55:58 EDT Ventricular Rate:  95 PR Interval:  156 QRS Duration: 133 QT Interval:  404 QTC Calculation: 508 R Axis:   -75 Text Interpretation: Sinus tachycardia Atrial premature complexes RBBB and LAFB Confirmed by Addison Lank [phone]) on 05/03/2021 8:56:40 PM      Radiology DG Chest Port 1 View  Result Date: 05/03/2021 CLINICAL DATA:  Sepsis EXAM: PORTABLE CHEST 1 VIEW COMPARISON:  02/10/2016 FINDINGS: Rounded opacity within the right mid lung zone peripherally represents callus related to a remote right fifth rib fracture. The lungs are otherwise clear. No pneumothorax or pleural effusion. Cardiac size within normal limits. Pulmonary vascularity is normal. No acute bone abnormality. IMPRESSION: No active disease. Electronically Signed   By: Fidela Salisbury MD   On: 05/03/2021 06:51   CT Renal Stone Study  Result Date: 05/03/2021 CLINICAL DATA:  Urinary retention, urinary tract infection, hematuria EXAM: CT ABDOMEN AND PELVIS WITHOUT CONTRAST TECHNIQUE: Multidetector CT imaging of the abdomen and pelvis was performed following the standard protocol without IV contrast. COMPARISON:  02/10/2016,  MRI 08/06/2013 FINDINGS: Lower chest: Mild bibasilar dependent atelectasis. The visualized heart and pericardium are unremarkable. Hepatobiliary: Cholelithiasis without pericholecystic inflammatory change noted. Stable cyst within the right hepatic dome. No intra or extrahepatic biliary ductal dilation. Pancreas: Unremarkable Spleen: Unremarkable Adrenals/Urinary Tract: Surgical changes of right radical nephrectomy are identified. Left adrenal gland is unremarkable. There is mild left hydronephrosis, new since prior examination to the level of the left ureterovesicular junction likely related to extrinsic compression by the massively enlarged prostate gland. 3 mm nonobstructing calculus is noted within the upper pole. Infiltration within the posterior pararenal space likely relates to prior nephrostomy placement. 5.7 cm left renal interpolar exophytic simple cortical cyst noted. Additional exophytic simple cortical cyst noted arising from the lower pole of the left kidney. There is a exophytic lesion of heterogeneous attenuation involving the lower pole the right kidney, however, new since prior examination and indeterminate though suspicious for a a developing solid renal mass. Foley catheter balloon is seen within a decompressed bladder lumen. There is circumferential thickening of the bladder wall which may relate to inflammation or chronic obstruction. Stomach/Bowel: A right lower quadrant incisional hernia has developed containing the ileocecal junction. A small amount of a free fluid is seen within the hernia sac, however, the involved loops of bowel appear unremarkable and there is no evidence of obstruction. The stomach, small bowel, and large bowel are otherwise unremarkable. The appendix is not well visualized and may be absent. No free intraperitoneal gas or fluid. Vascular/Lymphatic: The abdominal vasculature is unremarkable. No pathologic adenopathy within the abdomen and pelvis. Reproductive: There is  massive enlargement of the prostate gland. Other: Small fat containing left inguinal hernia is present. The rectum is unremarkable. Musculoskeletal: Numerous osseous lytic lesions are again identified and have progressed in the interval since the prior examination particularly within the a pelvis and visualized proximal femurs bilaterally. Pathologic fracture of L4 is again identified. No acute bone abnormality. IMPRESSION: Massive enlargement of the prostate gland. This likely results in mass effect upon the left ureterovesicular junction contributing to mild left hydronephrosis which appears new since prior examination. Foley decompression of the bladder lumen is noted. Indeterminate exophytic possibly solid lesion arising from the lower pole of the left kidney, new since prior examination. Given the depth of the lesion, sonography could be utilized, as an initial step, for further evaluation. Interval development of right lower quadrant  incisional hernia containing the ileocecal junction. No evidence of obstruction. Interval progression of disease involving numerous osseous lytic metastases. This appears most evident within the pelvis and visualized proximal femurs bilaterally. Stable pathologic fracture of L4. Electronically Signed   By: Fidela Salisbury MD   On: 05/03/2021 06:13    Pertinent labs & imaging results that were available during my care of the patient were reviewed by me and considered in my medical decision making (see chart for details).  Medications Ordered in ED Medications  lactated ringers infusion ( Intravenous Infusion Verify 05/03/21 1900)  cefTRIAXone (ROCEPHIN) 1 g in sodium chloride 0.9 % 100 mL IVPB (0 g Intravenous Stopped 05/03/21 0244)  docusate sodium (COLACE) capsule 100 mg (has no administration in time range)  polyethylene glycol (MIRALAX / GLYCOLAX) packet 17 g (has no administration in time range)  insulin aspart (novoLOG) injection 0-9 Units (0 Units Subcutaneous Not  Given 05/03/21 1943)  Chlorhexidine Gluconate Cloth 2 % PADS 6 each (6 each Topical Given 05/03/21 0542)  acetaminophen (TYLENOL) tablet 650 mg (650 mg Oral Given 05/03/21 1056)  traMADol (ULTRAM) tablet 25-50 mg (50 mg Oral Given 05/03/21 1056)  0.9 %  sodium chloride infusion (Manually program via Guardrails IV Fluids) ( Intravenous New Bag/Given 05/03/21 0157)  magnesium sulfate IVPB 2 g 50 mL (0 g Intravenous Stopped 05/03/21 1652)                                                                                                                                    Procedures .1-3 Lead EKG Interpretation Performed by: Fatima Blank, MD Authorized by: Fatima Blank, MD     Interpretation: normal     ECG rate:  111   ECG rate assessment: tachycardic     Rhythm: sinus tachycardia     Ectopy: none     Conduction: normal   .Critical Care Performed by: Fatima Blank, MD Authorized by: Fatima Blank, MD   Critical care provider statement:    Critical care time (minutes):  80   Critical care was necessary to treat or prevent imminent or life-threatening deterioration of the following conditions:  Sepsis and shock   Critical care was time spent personally by me on the following activities:  Discussions with consultants, evaluation of patient's response to treatment, examination of patient, ordering and performing treatments and interventions, ordering and review of laboratory studies, ordering and review of radiographic studies, pulse oximetry, re-evaluation of patient's condition, obtaining history from patient or surrogate and review of old charts    (including critical care time)  Medical Decision Making / ED Course I have reviewed the nursing notes for this encounter and the patient's prior records (if available in EHR or on provided paperwork).   Marcus Hatfield was evaluated in Emergency Department on 05/03/2021 for the symptoms described in the history of  present illness. He was evaluated in the context of the  global COVID-19 pandemic, which necessitated consideration that the patient might be at risk for infection with the SARS-CoV-2 virus that causes COVID-19. Institutional protocols and algorithms that pertain to the evaluation of patients at risk for COVID-19 are in a state of rapid change based on information released by regulatory bodies including the CDC and federal and state organizations. These policies and algorithms were followed during the patient's care in the ED.  Septic w/u started. IVF given. abx started. Foley irrigation but not flowing. Urology consulted.  Labs notable for leukocytosis, 3g drop in Hb to 6.4, AKI likely from post renal obstruction. Lactic of 10 - combination of blood loss and sepsis. CT ordered to assess for hydronephrosis. Blood transfusion started. Lactic acid improving.  Remained HDS, but still critical and requires ICU admission.       Final Clinical Impression(s) / ED Diagnoses Final diagnoses:  Sepsis with acute renal failure and septic shock, due to unspecified organism, unspecified acute renal failure type ([place])  Hypovolemic shock ([place])  Retention of urine due to occlusion of Foley catheter ([place])  AKI (acute kidney injury) ([place])      This chart was dictated using voice recognition software.  Despite best efforts to proofread,  errors can occur which can change the documentation meaning.   Fatima Blank, MD 05/03/21 2100

## 2021-05-03 NOTE — Consult Note (Signed)
Consult: gross hematuria  Requested by: Dr. Addison Lank  History of Present Illness: Marcus Hatfield is a 75 y.o. male with a history of renal cell carcinoma involving the right kidney, status post right lap radical nephrectomy and left cryo in 2017 at [facility].  He also has a significant history of elevated PSA (Dec 2021 60.47), BPH with LUTS (229 grams), CKD, HTN and multiple myeloma.  He presents to the [facility] emergency department 05/01/2021 with a 24-hour history of difficulty urinating and painless gross hematuria.  He is a non-smoker, has no history of kidney stones and is currently on aspirin once daily.  His last surveillance imaging was in MRI Abd Oct 2021 which revealed a 2.5 cm left lower pole enhancing mass. There was also posttreatment changes of the left lower pole from prior cryoablation with no enhancing residual soft tissue (followed at [place], Care Everywhere notes). The ER provider attempted to place a Foley catheter, but did not advance the catheter into the bladder, which exacerbated his gross hematuria.  Dr. Lovena Neighbours was able to easily advance the catheter into the bladder and hand irrigate numerous clots out before the urine eventually cleared and was discharged with outpatient follow-up.  He returned this evening with bladder distention, gross hematuria with clots and nondraining catheter.  His bladder was distended on exam.  His hemoglobin was 6.4.  Creatinine up to 3.93. He currently has a 22 [place] three-way Foley catheter with 30 mL of sterile water in the balloon in place.  I was able to irrigate about 500 cc of soft clot until he was clear.  CBI was initiated.  His abdominal/bladder distention resolved.  Critical care doctor has seen patient for admission and he is getting blood.  CT scan pending.  Also Rocephin started for possible UTI.    Past Medical History:  Diagnosis Date  . Arthritis   . CKD (chronic kidney disease) stage 3, GFR 30-59 ml/min (HCC) 20015   Creat  1.9  . Hypertension   . Multiple myeloma ([place]) 2016   [facility] heme [facility]  . Prostate disorder 02/2017   Past Surgical History:  Procedure Laterality Date  . KNEE SURGERY     over ten years ago  . NEPHRECTOMY Right 03/2015    Home Medications:  (Not in a hospital admission)  Allergies:  Allergies  Allergen Reactions  . Penicillins Hives    Has patient had a PCN reaction causing immediate rash, facial/tongue/throat swelling, SOB or lightheadedness with hypotension:Yes Has patient had a PCN reaction causing severe rash involving mucus membranes or skin necrosis: Yes Has patient had a PCN reaction that required hospitalization No Has patient had a PCN reaction occurring within the last 10 years: No If all of the above answers are "NO", then may proceed with Cephalosporin use.   . Latex Rash    Family History  Problem Relation Age of Onset  . Diabetes Father   . Hypertension Brother    Social History:  reports that he has never smoked. He has never used smokeless tobacco. He reports that he does not drink alcohol and does not use drugs.  ROS: A complete review of systems was performed.  All systems are negative except for pertinent findings as noted. Review of Systems  All other systems reviewed and are negative.    Physical Exam:  Vital signs in last 24 hours: Temp:  [97.5 F (36.4 C)] 97.5 F (36.4 C) (05/16 0228) Pulse Rate:  [96-100] 96 (05/16 0229) Resp:  [  20-26] 26 (05/16 0229) BP: (100-130)/(71-108) 126/108 (05/16 0229) SpO2:  [93 %-100 %] 100 % (05/16 0229) Weight:  [83.9 kg] 83.9 kg (05/16 0128) General:  Alert and oriented, No acute distress Cardiovascular: Regular rate and rhythm Lungs: Regular rate and effort Abdomen: Soft, nontender, no abdominal masses -bladder distention palpable up to the umbilicus which resolved with catheter irrigation. Back: No CVA tenderness Extremities: No edema Neurologic: Grossly intact  Procedure: Foley irrigation-the Foley  catheter is in good position.  It is almost to the hub due to his large prostate.  Multiple soft clots were irrigated and the catheter began to drain.  I drained about 800 cc of urine.  Catheter irrigated quite well and he was irrigated to clear.  CBI was initiated.  I did some initial charting and came back after 10 to 15 minutes and irrigated him again noted to be clear with CBI running well.   Laboratory Data:  Results for orders placed or performed during the hospital encounter of 05/03/21 (from the past 24 hour(s))  Type and screen [place]     Status: None (Preliminary result)   Collection Time: 05/03/21 12:17 AM  Result Value Ref Range   ABO/RH(D) B POS    Antibody Screen NEG    Sample Expiration 05/06/2021,2359    Unit Number [id number]    Blood Component Type RBC LR PHER1    Unit division 00    Status of Unit ALLOCATED    Transfusion Status OK TO TRANSFUSE    Crossmatch Result Compatible    Unit Number [id number]    Blood Component Type RBC LR PHER2    Unit division 00    Status of Unit ISSUED    Transfusion Status OK TO TRANSFUSE    Crossmatch Result      Compatible Performed at [facility], [place]. [address] [place], [place] [address]   Resp Panel by RT-PCR (Flu A&B, Covid) Nasopharyngeal Swab     Status: None   Collection Time: 05/03/21 12:24 AM   Specimen: Nasopharyngeal Swab; Nasopharyngeal(NP) swabs in vial transport medium  Result Value Ref Range   SARS Coronavirus 2 by RT PCR NEGATIVE NEGATIVE   Influenza A by PCR NEGATIVE NEGATIVE   Influenza B by PCR NEGATIVE NEGATIVE  Lactic acid, plasma     Status: Abnormal   Collection Time: 05/03/21 12:29 AM  Result Value Ref Range   Lactic Acid, Venous 10.2 (HH) 0.5 - 1.9 mmol/L  Comprehensive metabolic panel     Status: Abnormal   Collection Time: 05/03/21 12:29 AM  Result Value Ref Range   Sodium 118 (LL) 135 - 145 mmol/L   Potassium 3.3 (L) 3.5 - 5.1 mmol/L   Chloride 90 (L) 98 - 111  mmol/L   CO2 9 (L) 22 - 32 mmol/L   Glucose, Bld 238 (H) 70 - 99 mg/dL   BUN 26 (H) 8 - 23 mg/dL   Creatinine, Ser 3.93 (H) 0.61 - 1.24 mg/dL   Calcium 7.4 (L) 8.9 - 10.3 mg/dL   Total Protein 5.7 (L) 6.5 - 8.1 g/dL   Albumin 2.6 (L) 3.5 - 5.0 g/dL   AST 30 15 - 41 U/L   ALT 12 0 - 44 U/L   Alkaline Phosphatase 36 (L) 38 - 126 U/L   Total Bilirubin 0.6 0.3 - 1.2 mg/dL   GFR, Estimated 15 (L) >60 mL/min   Anion gap 19 (H) 5 - 15  CBC WITH DIFFERENTIAL  Status: Abnormal   Collection Time: 05/03/21 12:29 AM  Result Value Ref Range   WBC 24.0 (H) 4.0 - 10.5 K/uL   RBC 2.24 (L) 4.22 - 5.81 MIL/uL   Hemoglobin 6.4 (LL) 13.0 - 17.0 g/dL   HCT 20.2 (L) 39.0 - 52.0 %   MCV 90.2 80.0 - 100.0 fL   MCH 28.6 26.0 - 34.0 pg   MCHC 31.7 30.0 - 36.0 g/dL   RDW 14.0 11.5 - 15.5 %   Platelets 268 150 - 400 K/uL   nRBC 0.0 0.0 - 0.2 %   Neutrophils Relative % 85 %   Neutro Abs 20.6 (H) 1.7 - 7.7 K/uL   Lymphocytes Relative 5 %   Lymphs Abs 1.2 0.7 - 4.0 K/uL   Monocytes Relative 8 %   Monocytes Absolute 1.8 (H) 0.1 - 1.0 K/uL   Eosinophils Relative 0 %   Eosinophils Absolute 0.0 0.0 - 0.5 K/uL   Basophils Relative 0 %   Basophils Absolute 0.0 0.0 - 0.1 K/uL   Immature Granulocytes 2 %   Abs Immature Granulocytes 0.38 (H) 0.00 - 0.07 K/uL  Protime-INR     Status: Abnormal   Collection Time: 05/03/21 12:29 AM  Result Value Ref Range   Prothrombin Time 16.9 (H) 11.4 - 15.2 seconds   INR 1.4 (H) 0.8 - 1.2  APTT     Status: None   Collection Time: 05/03/21 12:29 AM  Result Value Ref Range   aPTT 29 24 - 36 seconds  I-Stat Chem 8, ED     Status: Abnormal   Collection Time: 05/03/21 12:52 AM  Result Value Ref Range   Sodium 119 (LL) 135 - 145 mmol/L   Potassium 3.3 (L) 3.5 - 5.1 mmol/L   Chloride 91 (L) 98 - 111 mmol/L   BUN 27 (H) 8 - 23 mg/dL   Creatinine, Ser 3.90 (H) 0.61 - 1.24 mg/dL   Glucose, Bld 228 (H) 70 - 99 mg/dL   Calcium, Ion 0.92 (L) 1.15 - 1.40 mmol/L   TCO2 12 (L)  22 - 32 mmol/L   Hemoglobin 6.5 (LL) 13.0 - 17.0 g/dL   HCT 19.0 (L) 39.0 - 52.0 %   Comment NOTIFIED PHYSICIAN   Prepare RBC (crossmatch)     Status: None   Collection Time: 05/03/21  1:19 AM  Result Value Ref Range   Order Confirmation      ORDER PROCESSED BY BLOOD BANK Performed at [facility], [address], [place], [place] [address]    Recent Results (from the past 240 hour(s))  Resp Panel by RT-PCR (Flu A&B, Covid) Nasopharyngeal Swab     Status: None   Collection Time: 05/03/21 12:24 AM   Specimen: Nasopharyngeal Swab; Nasopharyngeal(NP) swabs in vial transport medium  Result Value Ref Range Status   SARS Coronavirus 2 by RT PCR NEGATIVE NEGATIVE Final    Comment: (NOTE) SARS-CoV-2 target nucleic acids are NOT DETECTED.  The SARS-CoV-2 RNA is generally detectable in upper respiratory specimens during the acute phase of infection. The lowest concentration of SARS-CoV-2 viral copies this assay can detect is 138 copies/mL. A negative result does not preclude SARS-Cov-2 infection and should not be used as the sole basis for treatment or other patient management decisions. A negative result may occur with  improper specimen collection/handling, submission of specimen other than nasopharyngeal swab, presence of viral mutation(s) within the areas targeted by this assay, and inadequate number of viral copies(<138 copies/mL). A negative result must be  combined with clinical observations, patient history, and epidemiological information. The expected result is Negative.  Fact Sheet for Patients:  EntrepreneurPulse.com.au  Fact Sheet for Healthcare Providers:  IncredibleEmployment.be  This test is no t yet approved or cleared by the [place] FDA and  has been authorized for detection and/or diagnosis of SARS-CoV-2 by FDA under an Emergency Use Authorization (EUA). This EUA will remain  in effect (meaning this test can be used)  for the duration of the COVID-19 declaration under Section 564(b)(1) of the Act, 21 U.S.C.section 360bbb-3(b)(1), unless the authorization is terminated  or revoked sooner.       Influenza A by PCR NEGATIVE NEGATIVE Final   Influenza B by PCR NEGATIVE NEGATIVE Final    Comment: (NOTE) The Xpert Xpress SARS-CoV-2/FLU/RSV plus assay is intended as an aid in the diagnosis of influenza from Nasopharyngeal swab specimens and should not be used as a sole basis for treatment. Nasal washings and aspirates are unacceptable for Xpert Xpress SARS-CoV-2/FLU/RSV testing.  Fact Sheet for Patients: EntrepreneurPulse.com.au  Fact Sheet for Healthcare Providers: IncredibleEmployment.be  This test is not yet approved or cleared by the [place] FDA and has been authorized for detection and/or diagnosis of SARS-CoV-2 by FDA under an Emergency Use Authorization (EUA). This EUA will remain in effect (meaning this test can be used) for the duration of the COVID-19 declaration under Section 564(b)(1) of the Act, 21 U.S.C. section 360bbb-3(b)(1), unless the authorization is terminated or revoked.  Performed at [facility], [place] [address] [place], [place] [address]    Creatinine: Recent Labs    05/01/21 0805 05/03/21 0029 05/03/21 0052  CREATININE 2.61* 3.93* 3.90*    Impression/Assessment/plan:  Gross hematuria- likely due to BPH given prostate was over 200 g.  This may have been exacerbated by catheter malposition.  He is not on blood thinners.  CT scan pending.  Will follow.  I will let Dr. Lovena Neighbours know of admission.  Acute kidney injury- certainly could be prerenal given the anemia but may have been from some obstruction given the distended bladder.  As above, CT scan pending.  Festus Aloe 05/03/2021, 2:45 AM

## 2021-05-03 NOTE — ED Notes (Signed)
Pt received 2 liters NS bolus by EMS. 1 liter in route and 1 liter while in room with patient with MD.

## 2021-05-04 ENCOUNTER — Inpatient Hospital Stay (HOSPITAL_COMMUNITY): Payer: Medicare HMO

## 2021-05-04 DIAGNOSIS — R319 Hematuria, unspecified: Secondary | ICD-10-CM

## 2021-05-04 DIAGNOSIS — N179 Acute kidney failure, unspecified: Secondary | ICD-10-CM | POA: Diagnosis not present

## 2021-05-04 DIAGNOSIS — R310 Gross hematuria: Secondary | ICD-10-CM | POA: Diagnosis not present

## 2021-05-04 DIAGNOSIS — D62 Acute posthemorrhagic anemia: Secondary | ICD-10-CM | POA: Diagnosis not present

## 2021-05-04 LAB — CBC
HCT: 22.4 % — ABNORMAL LOW (ref 39.0–52.0)
HCT: 18 % — ABNORMAL LOW (ref 39.0–52.0)
Hemoglobin: 7.8 g/dL — ABNORMAL LOW (ref 13.0–17.0)
Hemoglobin: 6.2 g/dL — CL (ref 13.0–17.0)
MCH: 29.2 pg (ref 26.0–34.0)
MCH: 29.4 pg (ref 26.0–34.0)
MCHC: 34.8 g/dL (ref 30.0–36.0)
MCHC: 34.4 g/dL (ref 30.0–36.0)
MCV: 83.9 fL (ref 80.0–100.0)
MCV: 85.3 fL (ref 80.0–100.0)
Platelets: 194 10*3/uL (ref 150–400)
Platelets: 213 10*3/uL (ref 150–400)
RBC: 2.67 MIL/uL — ABNORMAL LOW (ref 4.22–5.81)
RBC: 2.11 MIL/uL — ABNORMAL LOW (ref 4.22–5.81)
RDW: 13.8 % (ref 11.5–15.5)
RDW: 13.9 % (ref 11.5–15.5)
WBC: 28.6 10*3/uL — ABNORMAL HIGH (ref 4.0–10.5)
WBC: 31.1 10*3/uL — ABNORMAL HIGH (ref 4.0–10.5)
nRBC: 0.1 % (ref 0.0–0.2)
nRBC: 0.1 % (ref 0.0–0.2)

## 2021-05-04 LAB — BASIC METABOLIC PANEL
Anion gap: 12 (ref 5–15)
Anion gap: 18 — ABNORMAL HIGH (ref 5–15)
BUN: 21 mg/dL (ref 8–23)
BUN: 23 mg/dL (ref 8–23)
CO2: 17 mmol/L — ABNORMAL LOW (ref 22–32)
CO2: 13 mmol/L — ABNORMAL LOW (ref 22–32)
Calcium: 7.5 mg/dL — ABNORMAL LOW (ref 8.9–10.3)
Calcium: 7.4 mg/dL — ABNORMAL LOW (ref 8.9–10.3)
Chloride: 100 mmol/L (ref 98–111)
Chloride: 97 mmol/L — ABNORMAL LOW (ref 98–111)
Creatinine, Ser: 2.75 mg/dL — ABNORMAL HIGH (ref 0.61–1.24)
Creatinine, Ser: 3.36 mg/dL — ABNORMAL HIGH (ref 0.61–1.24)
GFR, Estimated: 23 mL/min — ABNORMAL LOW (ref >60)
GFR, Estimated: 18 mL/min — ABNORMAL LOW (ref >60)
Glucose, Bld: 104 mg/dL — ABNORMAL HIGH (ref 70–99)
Glucose, Bld: 148 mg/dL — ABNORMAL HIGH (ref 70–99)
Potassium: 3.5 mmol/L (ref 3.5–5.1)
Potassium: 3.3 mmol/L — ABNORMAL LOW (ref 3.5–5.1)
Sodium: 129 mmol/L — ABNORMAL LOW (ref 135–145)
Sodium: 128 mmol/L — ABNORMAL LOW (ref 135–145)

## 2021-05-04 LAB — GLUCOSE, CAPILLARY
Glucose-Capillary: 114 mg/dL — ABNORMAL HIGH (ref 70–99)
Glucose-Capillary: 102 mg/dL — ABNORMAL HIGH (ref 70–99)
Glucose-Capillary: 107 mg/dL — ABNORMAL HIGH (ref 70–99)
Glucose-Capillary: 147 mg/dL — ABNORMAL HIGH (ref 70–99)
Glucose-Capillary: 143 mg/dL — ABNORMAL HIGH (ref 70–99)

## 2021-05-04 LAB — PREPARE RBC (CROSSMATCH)

## 2021-05-04 LAB — MAGNESIUM
Magnesium: 1.9 mg/dL (ref 1.7–2.4)
Magnesium: 1.8 mg/dL (ref 1.7–2.4)

## 2021-05-04 LAB — HEMOGLOBIN AND HEMATOCRIT, BLOOD
HCT: 22.5 % — ABNORMAL LOW (ref 39.0–52.0)
HCT: 21.6 % — ABNORMAL LOW (ref 39.0–52.0)
Hemoglobin: 7.8 g/dL — ABNORMAL LOW (ref 13.0–17.0)
Hemoglobin: 7.2 g/dL — ABNORMAL LOW (ref 13.0–17.0)

## 2021-05-04 LAB — PHOSPHORUS: Phosphorus: 3.8 mg/dL (ref 2.5–4.6)

## 2021-05-04 LAB — LACTIC ACID, PLASMA: Lactic Acid, Venous: 3.9 mmol/L (ref 0.5–1.9)

## 2021-05-04 MED ORDER — SODIUM BICARBONATE 8.4 % IV SOLN
INTRAVENOUS | Status: DC
  Filled 2021-05-04 (×6): qty 1000

## 2021-05-04 MED ORDER — POTASSIUM CHLORIDE CRYS ER 20 MEQ PO TBCR
40.0000 meq | EXTENDED_RELEASE_TABLET | Freq: Four times a day (QID) | ORAL | Status: AC
  Administered 2021-05-04 (×2): 40 meq via ORAL
  Filled 2021-05-04 (×2): qty 2

## 2021-05-04 MED ORDER — LACTATED RINGERS IV SOLN: INTRAVENOUS | Status: DC

## 2021-05-04 MED ORDER — OXYBUTYNIN CHLORIDE 5 MG PO TABS
5.0000 mg | ORAL_TABLET | Freq: Three times a day (TID) | ORAL | Status: DC | PRN
  Filled 2021-05-04: qty 1

## 2021-05-04 MED ORDER — LACTATED RINGERS IV BOLUS
500.0000 mL | Freq: Once | INTRAVENOUS | Status: AC
  Administered 2021-05-04: 500 mL via INTRAVENOUS

## 2021-05-04 MED ORDER — DEGARELIX ACETATE(240 MG DOSE) 120 MG/VIAL ~~LOC~~ SOLR
240.0000 mg | Freq: Once | SUBCUTANEOUS | Status: AC
  Administered 2021-05-05: 240 mg via SUBCUTANEOUS
  Filled 2021-05-04 (×2): qty 6

## 2021-05-04 MED ORDER — OXYCODONE HCL 5 MG PO TABS
5.0000 mg | ORAL_TABLET | Freq: Four times a day (QID) | ORAL | Status: DC | PRN
Start: 2021-05-04 — End: 2021-05-12
  Administered 2021-05-06: 5 mg via ORAL
  Filled 2021-05-04: qty 1

## 2021-05-04 MED ORDER — SODIUM CHLORIDE 0.9% IV SOLUTION: Freq: Once | INTRAVENOUS | Status: AC

## 2021-05-04 MED ORDER — FINASTERIDE 5 MG PO TABS
5.0000 mg | ORAL_TABLET | Freq: Every day | ORAL | Status: DC
  Administered 2021-05-04 – 2021-05-12 (×9): 5 mg via ORAL
  Filled 2021-05-04 (×9): qty 1

## 2021-05-04 MED ORDER — SODIUM BICARBONATE 650 MG PO TABS
650.0000 mg | ORAL_TABLET | Freq: Two times a day (BID) | ORAL | Status: DC
  Administered 2021-05-04 – 2021-05-06 (×5): 650 mg via ORAL
  Filled 2021-05-04 (×5): qty 1

## 2021-05-04 NOTE — Progress Notes (Addendum)
Urology Inpatient Progress Report  Septic shock (HCC) [A41.9, R65.21]        Intv/Subj: Called overnight due to difficulty with continuous bladder irrigation.  Patient clotted off Foley multiple times requiring manual irrigation until catheter had to be exchanged.  Active Problems:   Septic shock (HCC)  Current Facility-Administered Medications  Medication Dose Route Frequency Provider Last Rate Last Admin  . acetaminophen (TYLENOL) tablet 650 mg  650 mg Oral Q6H PRN Tim LairReese, Stephanie M, PA-C   650 mg at 05/04/21 0414  . cefTRIAXone (ROCEPHIN) 1 g in sodium chloride 0.9 % 100 mL IVPB  1 g Intravenous Q24H Cardama, Amadeo GarnetPedro Eduardo, MD 200 mL/hr at 05/03/21 2357 1 g at 05/03/21 2357  . Chlorhexidine Gluconate Cloth 2 % PADS 6 each  6 each Topical Q0600 Annett Fabianichards, Karol A, MD   6 each at 05/03/21 0542  . docusate sodium (COLACE) capsule 100 mg  100 mg Oral BID PRN Annett Fabianichards, Karol A, MD      . insulin aspart (novoLOG) injection 0-9 Units  0-9 Units Subcutaneous Q4H Annett Fabianichards, Karol A, MD      . polyethylene glycol (MIRALAX / GLYCOLAX) packet 17 g  17 g Oral Daily PRN Annett Fabianichards, Karol A, MD      . traMADol Janean Sark(ULTRAM) tablet 25-50 mg  25-50 mg Oral Q12H PRN Tim LairReese, Stephanie M, PA-C   50 mg at 05/03/21 1056     Objective: Vital: Vitals:   05/04/21 0333 05/04/21 0400 05/04/21 0414 05/04/21 0500  BP:  122/71  96/63  Pulse:  (!) 125    Resp:  (!) 26  (!) 27  Temp: (!) 100.7 F (38.2 C)     TempSrc: Oral     SpO2:  99%  98%  Weight:   86.5 kg   Height:       I/Os: I/O last 3 completed shifts: In: [id number] [I.V.:2085.6; Blood:709.6; Other:24000; IV Piggyback:146.8] Out: [id number] [Urine:23730; Stool:2]  Physical Exam:  General: Patient is in no apparent distress Lungs: Normal respiratory effort, chest expands symmetrically. GI: The abdomen is soft and nontender without mass. Foley: 24 French three-way Foley catheter with continuous bladder irrigation running at fast drip Ext: lower  extremities symmetric  Lab Results: Recent Labs    05/01/21 0805 05/03/21 0029 05/03/21 0052 05/03/21 1036 05/03/21 1743 05/04/21 0031  WBC 6.4 24.0*  --  20.0*  --   --   HGB 9.9* 6.4*   < > 7.5* 8.2* 7.8*  HCT 29.6* 20.2*   < > 21.4* 23.0* 22.5*   < > = values in this interval not displayed.   Recent Labs    05/01/21 0805 05/03/21 0029 05/03/21 0052 05/03/21 1036  NA 122* 118* 119* 124*  K 3.4* 3.3* 3.3* 3.9  CL 90* 90* 91* 95*  CO2 21* 9*  --  20*  GLUCOSE 101* 238* 228* 107*  BUN 12 26* 27* 24*  CREATININE 2.61* 3.93* 3.90* 3.02*  CALCIUM 8.0* 7.4*  --  7.2*   Recent Labs    05/03/21 0029  INR 1.4*   No results for input(s): LABURIN in the last 72 hours. Results for orders placed or performed during the hospital encounter of 05/03/21  Resp Panel by RT-PCR (Flu A&B, Covid) Nasopharyngeal Swab     Status: None   Collection Time: 05/03/21 12:24 AM   Specimen: Nasopharyngeal Swab; Nasopharyngeal(NP) swabs in vial transport medium  Result Value Ref Range Status   SARS Coronavirus 2 by RT PCR NEGATIVE NEGATIVE Final  Comment: (NOTE) SARS-CoV-2 target nucleic acids are NOT DETECTED.  The SARS-CoV-2 RNA is generally detectable in upper respiratory specimens during the acute phase of infection. The lowest concentration of SARS-CoV-2 viral copies this assay can detect is 138 copies/mL. A negative result does not preclude SARS-Cov-2 infection and should not be used as the sole basis for treatment or other patient management decisions. A negative result may occur with  improper specimen collection/handling, submission of specimen other than nasopharyngeal swab, presence of viral mutation(s) within the areas targeted by this assay, and inadequate number of viral copies(<138 copies/mL). A negative result must be combined with clinical observations, patient history, and epidemiological information. The expected result is Negative.  Fact Sheet for Patients:   EntrepreneurPulse.com.au  Fact Sheet for Healthcare Providers:  IncredibleEmployment.be  This test is no t yet approved or cleared by the [place] FDA and  has been authorized for detection and/or diagnosis of SARS-CoV-2 by FDA under an Emergency Use Authorization (EUA). This EUA will remain  in effect (meaning this test can be used) for the duration of the COVID-19 declaration under Section 564(b)(1) of the Act, 21 U.S.C.section 360bbb-3(b)(1), unless the authorization is terminated  or revoked sooner.       Influenza A by PCR NEGATIVE NEGATIVE Final   Influenza B by PCR NEGATIVE NEGATIVE Final    Comment: (NOTE) The Xpert Xpress SARS-CoV-2/FLU/RSV plus assay is intended as an aid in the diagnosis of influenza from Nasopharyngeal swab specimens and should not be used as a sole basis for treatment. Nasal washings and aspirates are unacceptable for Xpert Xpress SARS-CoV-2/FLU/RSV testing.  Fact Sheet for Patients: EntrepreneurPulse.com.au  Fact Sheet for Healthcare Providers: IncredibleEmployment.be  This test is not yet approved or cleared by the [place] FDA and has been authorized for detection and/or diagnosis of SARS-CoV-2 by FDA under an Emergency Use Authorization (EUA). This EUA will remain in effect (meaning this test can be used) for the duration of the COVID-19 declaration under Section 564(b)(1) of the Act, 21 U.S.C. section 360bbb-3(b)(1), unless the authorization is terminated or revoked.  Performed at [facility], [place] [address], [place], [place] [address]   Blood Culture (routine x 2)     Status: None (Preliminary result)   Collection Time: 05/03/21  2:10 AM   Specimen: BLOOD RIGHT FOREARM  Result Value Ref Range Status   Specimen Description BLOOD RIGHT FOREARM  Final   Special Requests   Final    BOTTLES DRAWN AEROBIC AND ANAEROBIC Blood Culture adequate volume    Culture   Final    NO GROWTH < 12 HOURS Performed at [facility], [place] [address] [place], [place] [address]    Report Status PENDING  Incomplete  MRSA PCR Screening     Status: None   Collection Time: 05/03/21  4:30 AM   Specimen: Nasopharyngeal  Result Value Ref Range Status   MRSA by PCR NEGATIVE NEGATIVE Final    Comment:        The GeneXpert MRSA Assay (FDA approved for NASAL specimens only), is one component of a comprehensive MRSA colonization surveillance program. It is not intended to diagnose MRSA infection nor to guide or monitor treatment for MRSA infections. Performed at [facility], [place] [address], [place], [place] [address]   Blood Culture (routine x 2)     Status: None (Preliminary result)   Collection Time: 05/03/21 10:36 AM   Specimen: BLOOD  Result Value Ref Range Status  Specimen Description BLOOD LEFT ANTECUBITAL  Final   Special Requests   Final    BOTTLES DRAWN AEROBIC AND ANAEROBIC Blood Culture results may not be optimal due to an inadequate volume of blood received in culture bottles   Culture   Final    NO GROWTH < 12 HOURS Performed at [facility] [place] [address] [place], [place] [address]    Report Status PENDING  Incomplete    Studies/Results: DG Chest Port 1 View  Result Date: 05/03/2021 CLINICAL DATA:  Sepsis EXAM: PORTABLE CHEST 1 VIEW COMPARISON:  02/10/2016 FINDINGS: Rounded opacity within the right mid lung zone peripherally represents callus related to a remote right fifth rib fracture. The lungs are otherwise clear. No pneumothorax or pleural effusion. Cardiac size within normal limits. Pulmonary vascularity is normal. No acute bone abnormality. IMPRESSION: No active disease. Electronically Signed   By: Fidela Salisbury MD   On: 05/03/2021 06:51   CT Renal Stone Study  Result Date: 05/03/2021 CLINICAL DATA:  Urinary retention, urinary tract infection, hematuria EXAM: CT ABDOMEN AND PELVIS WITHOUT CONTRAST TECHNIQUE:  Multidetector CT imaging of the abdomen and pelvis was performed following the standard protocol without IV contrast. COMPARISON:  02/10/2016, MRI 08/06/2013 FINDINGS: Lower chest: Mild bibasilar dependent atelectasis. The visualized heart and pericardium are unremarkable. Hepatobiliary: Cholelithiasis without pericholecystic inflammatory change noted. Stable cyst within the right hepatic dome. No intra or extrahepatic biliary ductal dilation. Pancreas: Unremarkable Spleen: Unremarkable Adrenals/Urinary Tract: Surgical changes of right radical nephrectomy are identified. Left adrenal gland is unremarkable. There is mild left hydronephrosis, new since prior examination to the level of the left ureterovesicular junction likely related to extrinsic compression by the massively enlarged prostate gland. 3 mm nonobstructing calculus is noted within the upper pole. Infiltration within the posterior pararenal space likely relates to prior nephrostomy placement. 5.7 cm left renal interpolar exophytic simple cortical cyst noted. Additional exophytic simple cortical cyst noted arising from the lower pole of the left kidney. There is a exophytic lesion of heterogeneous attenuation involving the lower pole the right kidney, however, new since prior examination and indeterminate though suspicious for a a developing solid renal mass. Foley catheter balloon is seen within a decompressed bladder lumen. There is circumferential thickening of the bladder wall which may relate to inflammation or chronic obstruction. Stomach/Bowel: A right lower quadrant incisional hernia has developed containing the ileocecal junction. A small amount of a free fluid is seen within the hernia sac, however, the involved loops of bowel appear unremarkable and there is no evidence of obstruction. The stomach, small bowel, and large bowel are otherwise unremarkable. The appendix is not well visualized and may be absent. No free intraperitoneal gas or fluid.  Vascular/Lymphatic: The abdominal vasculature is unremarkable. No pathologic adenopathy within the abdomen and pelvis. Reproductive: There is massive enlargement of the prostate gland. Other: Small fat containing left inguinal hernia is present. The rectum is unremarkable. Musculoskeletal: Numerous osseous lytic lesions are again identified and have progressed in the interval since the prior examination particularly within the a pelvis and visualized proximal femurs bilaterally. Pathologic fracture of L4 is again identified. No acute bone abnormality. IMPRESSION: Massive enlargement of the prostate gland. This likely results in mass effect upon the left ureterovesicular junction contributing to mild left hydronephrosis which appears new since prior examination. Foley decompression of the bladder lumen is noted. Indeterminate exophytic possibly solid lesion arising from the lower pole of the left kidney, new since prior examination. Given the depth of the  lesion, sonography could be utilized, as an initial step, for further evaluation. Interval development of right lower quadrant incisional hernia containing the ileocecal junction. No evidence of obstruction. Interval progression of disease involving numerous osseous lytic metastases. This appears most evident within the pelvis and visualized proximal femurs bilaterally. Stable pathologic fracture of L4. Electronically Signed   By: Fidela Salisbury MD   On: 05/03/2021 06:13    Assessment: Gross hematuria- likely due to BPH given prostate was significantly enlarged.    55 French three-way Foley catheter was removed and a 24 [place] three-way Foley catheter was replaced under sterile conditions by urology.  4 L of sterile saline were then used to irrigate patient's bladder until clot burden was removed.  Continuous bladder irrigation was then reinitiated.  We will order renal ultrasound to assess for clot burden and keep patient n.p.o. until renal ultrasound has been  performed.    [name], MD Urology 05/04/2021, 6:48 AM

## 2021-05-04 NOTE — Progress Notes (Signed)
Foley placed on traction with improvement in gross hematuria. CBI running clear and free of clots.

## 2021-05-04 NOTE — Progress Notes (Signed)
Pt CBI not draining despite repositioning and flushing 60cc x4. Pt increasingly tachycardic from 110 to 140s HR, BP down from SBP 107-120 to SBP 90s. Pt diaphoretic and complaining of 10/10 abdominal pain, pressure, discomfort. PA Ayesha Rumpf and DO Clark notified. Urology MD [facility] notified via phone with verbal orders to clamp inflow of CBI and he will assess bedside in several hours. PA Reese and DO Clark notified of urology's response along with this RN's concerns regarding pt disposition. Prn pain medication given.   Will continue to monitor and notify team as necessary.   Franciso Dierks RN

## 2021-05-04 NOTE — Progress Notes (Addendum)
NAME:  Marcus Hatfield MRN:  [id number] DOB:  [birth year] LOS: 1 ADMISSION DATE:  05/03/2021 CONSULTATION DATE:  05/02/2021 REFERRING MD:  Ronnald Nian CHIEF COMPLAINT: Shock, hematuria  History of Present Illness:  75 year old male with PMHx significant for HTN, multiple myeloma, RCC of R kidney (s/p nephrectomy 2017 at [facility]), CKD stage IIIa andBPH with LUTS. Presented to the ED 5/15 with painful lower abdominal distention, gross hematuria and failure of foley to drain (previous episodes of urinary retention after UTI a few weeks prior to presentation, requiring Foley placement).  On arrival to ED, patient was noted to be hypotensive. Gross hematuria was noted prompting Urology consult. Dr Junious Silk assess patient and bedside where multiple clots and gross blood were lavaged from the bladder. Hgb decreased from 9.9 to 6.5 in 48H; 2U PRBCs were transfused. LA was noted to be 10.2. CT A/P demonstrated massive prostate gland enlargement, exophytic possibly solid lesion in the lower pole of the L kidney (known prior to admission), RLQ incisional hernia and numerous osseous lytic lesions (present since 2017, per chart review).  PCCM consulted for ICU admission in the setting of shock.  Pertinent Medical History:   has a past medical history of Arthritis, CKD (chronic kidney disease) stage 3, GFR 30-59 ml/min ([place]) (20015), Hypertension, Multiple myeloma ([place]) (2016), and Prostate disorder (02/2017). Renal Cell Carcinoma (R, s/p nephrectomy 2017)  Significant Hospital Events: Including procedures, antibiotic start and stop dates in addition to other pertinent events    5/15 - Admitted via ED for gross hematuria, hypotension, shock. Empiric Ceftriaxone started. CT A/P showed massive prostate gland enlargement, exophytic possibly solid lesion in the lower pole of the L kidney, numerous osseous lytic lesions (present since 2017) . 5/16 -  Lactate normalized, Hgb 7.5 s/p 2U PRBCs. CBI per  Urology . 5/17 - Hgb stable; Foley exchange by Uro after clotting during CBI with improved flow post-exchange. Renal US to assess clot burden  Interim History / Subjective:  Foley clotted overnight despite CBI Urology came to bedside and sterilely exchanged Foley Significant suprapubic pain per patient, relieved by catheter exchange Otherwise feeling well this morning Renal US today  Objective:  Blood pressure 96/63, pulse (!) 125, temperature 97.8 F (36.6 C), temperature source Oral, resp. rate (!) 27, height $RemoveBe'5\' 9"'DqZtNoLqF$  (1.753 m), weight 86.5 kg, SpO2 98 %.        Intake/Output Summary (Last 24 hours) at 05/04/2021 0855 Last data filed at 05/04/2021 0300 Gross per 24 hour  Intake 28425.66 ml  Output 27187 ml  Net 1238.66 ml   Filed Weights   05/03/21 0128 05/04/21 0414  Weight: 83.9 kg 86.5 kg   Physical Examination: General: Acutely ill-appearing male in NAD. HEENT: [place]/AT, anicteric sclera, PERRL, moist mucous membranes. Neuro: Awake, oriented x 4. Responds to verbal stimuli. Following commands consistently. Moves all 4 extremities spontaneously. Strength 5/5 in all 4 extremities. CV: RRR, no m/g/r. PULM: Breathing even and unlabored on RA. Lung fields CTAB. GI: Soft, minimally distended, mildly TTP over suprapubic area. Normoactive bowel sounds. Extremities: Trace bilateral LE edema noted. Skin: Warm/dry, no rashes. GU: Foley catheter in place with CBI, urine red without clots visualized.  Labs/imaging that I have personally reviewed: (right click and "Reselect all SmartList Selections" daily)  WBC 28.6 (20), H&H 7.8/22.4, Plt 194 Hgb trend 6.5 -> 7.5 -> 8.2 -> 7.8  Na 129 (124), K 3.5, CO2 17, Cr 2.7 (3.0) Ca 7.5, Mg 1.9   LA 1.5 5/16  BCx NGTD  Resolved  Hospital Problem List:   Lactic acidosis  Assessment & Plan:  Hemorrhagic shock secondary to acute blood loss anemia versus septic shock in the setting of UTI Acute blood loss secondary to gross  hematuria Leukocytosis, likely reactive Presented to ED 5/15 for abdominal pain/distention, gross hematuria and inability to drain Foley. Recent ED encounters 4/30 (for UTI and urinary retention, Foley placed) and 5/14 (urinary retention s/p Foley removal). Noted to have gross hematuria with associated Hgb drop from 9.5 to 6.5. Hypotensive with LA 10. Received 2U PRBCs and urgent Urology consult. - Trend H&H Q6H while on CBI - Transfuse for Hgb < 7 - Urology consulted, following - Continue CBI, per Urology DO NOT CLAMP FOLEY under any circumstances - F/u US Renal  - Continue ceftriaxone, low threshold to broaden if clinically worsening  AKI on CKD stage IIIa Chronic hyponatremia Hypokalemia - Trend BMP - Replete electrolytes as indicated - Monitor I&Os - F/u urine studies - Avoid nephrotoxic agents as able - Ensure adequate renal perfusion  History of RCC (R kidney s/p nephrectomy 2017 at [facility]) History of multiple myeloma Renal cell carcinoma diagnosed 2017, followed by [facility]. S/p nephrectomy 2017. CT this admission demonstrating indeterminate exophytic ?solid lesion in the lower pole of the L kidney.  CT also demosntrated numerous osseous lytic lesions (though present since 2017, per chart review). Followed by Heme-Onc at [facility] for RCC, MM. - Close follow-up for recurrence of RCC - F/u US Renal today  Hypertension - Hold home antihypertensives  BPH CT 5/16 demonstrating massive enlargement of the prostate gland with mass effect on the L UVJ contributing to mild L hydronephrosis. - Flomax - Keep Foley (replaced by Uro 5/17AM), do not clamp  Best Practice: (right click and "Reselect all SmartList Selections" daily)  Diet:  Oral Pain/Anxiety/Delirium protocol (if indicated): No VAP protocol (if indicated): Not indicated DVT prophylaxis: Contraindicated - active bleeding GI prophylaxis: N/A Glucose control:  SSI No Central venous access:  N/A Arterial line:  N/A Foley:  Yes, and  it is still needed - CBI Mobility:  OOB  PT consulted: Yes Last date of multidisciplinary goals of care discussion [Pending] Code Status:  full code Disposition: ICU  Critical care time: N/A   Rhae Lerner [place] 05/04/21 8:55 AM  Please see [email or web address] for pager details.  From 7A-7P if no response, please call [phone] After hours, please call E-Link [phone]

## 2021-05-04 NOTE — Progress Notes (Signed)
Subjective/Chief Complaint:  1 - Massive Prostate With Recurrent Gross Hematuria - 600gm prostate by ER CT 05/02/21 on eval gross hemautira. Negative BX in past and stable high PSA 60-70 range x years. Follows Marcus Hatfield at [facility]. Amazingly not on finasteride at baseline (now started this admission).  2 - Urosepsis - lactic acidosis, fevers, leukocytosis, bacteruria c/w early urosepsis. Placed on empiric rocpehin pend;ing further CX data.  3 - Multifocal, Recurrent Renal Cancer - s/p RIGHT nephrectomy and LEFT renal cryoblation 2017 for low grade papillary cancers. Recurrence 2021 of new LEFT sided lower pole tumor now 3cm by CT 2022, pt has refused ablation.   4 - Acute On Chronic Renal Failure / Solitary Left Kidney - s/p Rt nephrectomy 2017 for cancer. Baseline Cr 1.8. Rise to 3.0 this admission, poor PO intake x few days prior. No sig left hydro of solitary kideny.   PMH sig for multiple myeloma (follows [facility] med-onc, on surveillance), DM2 (A1c 7s) Large Rt abs incisional hernia. He is retired from Probation officer with [employer], lots of family in town. His PCP is Marcus Hatfield.  Today "Marcus Hatfield" is seen for eval of above. Placed on IV ABX for urosepsis and bladder irrigation for bladder/prostate source bleeding. Hgb 6s.   Objective: Vital signs in last 24 hours: Temp:  [97.8 F (36.6 C)-100.8 F (38.2 C)] 99.3 F (37.4 C) (05/17 1129) Pulse Rate:  [112-125] 125 (05/17 0400) Resp:  [15-30] 20 (05/17 1100) BP: (96-151)/(58-97) 97/81 (05/17 1100) SpO2:  [95 %-100 %] 99 % (05/17 1100) Weight:  [86.5 kg] 86.5 kg (05/17 0414) Last BM Date: 05/03/21  Intake/Output from previous day: 05/16 0701 - 05/17 0700 In: 28962.4 [I.V.:1525.8; Blood:386.7; IV Piggyback:50] Out: [id number] [Urine:29580; Stool:2] Intake/Output this shift: Total I/O In: 3300 [Other:3300] Out: 6000 [Urine:6000]  General appearance: alert, appears stated age and very pleasant, wife and son at bedside.  Eyes:  negative Nose: Nares normal. Septum midline. Mucosa normal. No drainage or sinus tenderness. Throat: lips, mucosa, and tongue normal; teeth and gums normal Neck: supple, symmetrical, trachea midline Back: symmetric, no curvature. ROM normal. No CVA tenderness. Resp: Non-labored on minimal [place] O2. Cardio: sinus tach by bedside monitor 110s.  GI: soft, non-tender; bowel sounds normal; no masses,  no organomegaly Male genitalia: normal, 3 way foleyin place on irrigation and traction wtih very light pink urine at 2gtt / sec NS irrigation.  Pulses: 2+ and symmetric Skin: Skin color, texture, turgor normal. No rashes or lesions Lymph nodes: Cervical, supraclavicular, and axillary nodes normal. Neurologic: Grossly normal  Lab Results:  Recent Labs    05/03/21 1036 05/03/21 1743 05/04/21 0031 05/04/21 0720  WBC 20.0*  --   --  28.6*  HGB 7.5*   < > 7.8* 7.8*  HCT 21.4*   < > 22.5* 22.4*  PLT 184  --   --  194   < > = values in this interval not displayed.   BMET Recent Labs    05/03/21 1036 05/04/21 0720  NA 124* 129*  K 3.9 3.5  CL 95* 100  CO2 20* 17*  GLUCOSE 107* 104*  BUN 24* 21  CREATININE 3.02* 2.75*  CALCIUM 7.2* 7.5*   PT/INR Recent Labs    05/03/21 0029  LABPROT 16.9*  INR 1.4*   ABG No results for input(s): PHART, HCO3 in the last 72 hours.  Invalid input(s): PCO2, PO2  Studies/Results: US RENAL  Result Date: 05/04/2021 CLINICAL DATA:  Gross hematuria. Prior right nephrectomy. Chronic kidney  disease. EXAM: RENAL / URINARY TRACT ULTRASOUND COMPLETE COMPARISON:  08/14/2015.  CT 05/03/2021 FINDINGS: Right Kidney: Renal measurements: Prior right nephrectomy Left Kidney: Renal measurements: 12.5 x 6.4 x 6.3 cm = volume: 262 mL. Normal echotexture. Mild fullness of the renal pelvis. Left upper pole cyst measures up to 5 cm. Solid-appearing lesion noted off the lower pole of the left kidney measures up to 3 cm. Bladder: Foley catheter in place. Hypoechoic filling  defects within the bladder could reflect mass or hematoma. Other: Markedly enlarged prostate. IMPRESSION: Fullness of the left renal collecting system. Exophytic solid-appearing mass off the lower pole of the left kidney measures 3 cm. This could be further characterized with MRI. Markedly enlarged prostate. Hypoechoic area within the bladder could reflect mass or hematoma. Electronically Signed   By: Rolm Baptise M.D.   On: 05/04/2021 11:09   DG Chest Port 1 View  Result Date: 05/03/2021 CLINICAL DATA:  Sepsis EXAM: PORTABLE CHEST 1 VIEW COMPARISON:  02/10/2016 FINDINGS: Rounded opacity within the right mid lung zone peripherally represents callus related to a remote right fifth rib fracture. The lungs are otherwise clear. No pneumothorax or pleural effusion. Cardiac size within normal limits. Pulmonary vascularity is normal. No acute bone abnormality. IMPRESSION: No active disease. Electronically Signed   By: Fidela Salisbury MD   On: 05/03/2021 06:51   CT Renal Stone Study  Result Date: 05/03/2021 CLINICAL DATA:  Urinary retention, urinary tract infection, hematuria EXAM: CT ABDOMEN AND PELVIS WITHOUT CONTRAST TECHNIQUE: Multidetector CT imaging of the abdomen and pelvis was performed following the standard protocol without IV contrast. COMPARISON:  02/10/2016, MRI 08/06/2013 FINDINGS: Lower chest: Mild bibasilar dependent atelectasis. The visualized heart and pericardium are unremarkable. Hepatobiliary: Cholelithiasis without pericholecystic inflammatory change noted. Stable cyst within the right hepatic dome. No intra or extrahepatic biliary ductal dilation. Pancreas: Unremarkable Spleen: Unremarkable Adrenals/Urinary Tract: Surgical changes of right radical nephrectomy are identified. Left adrenal gland is unremarkable. There is mild left hydronephrosis, new since prior examination to the level of the left ureterovesicular junction likely related to extrinsic compression by the massively enlarged  prostate gland. 3 mm nonobstructing calculus is noted within the upper pole. Infiltration within the posterior pararenal space likely relates to prior nephrostomy placement. 5.7 cm left renal interpolar exophytic simple cortical cyst noted. Additional exophytic simple cortical cyst noted arising from the lower pole of the left kidney. There is a exophytic lesion of heterogeneous attenuation involving the lower pole the right kidney, however, new since prior examination and indeterminate though suspicious for a a developing solid renal mass. Foley catheter balloon is seen within a decompressed bladder lumen. There is circumferential thickening of the bladder wall which may relate to inflammation or chronic obstruction. Stomach/Bowel: A right lower quadrant incisional hernia has developed containing the ileocecal junction. A small amount of a free fluid is seen within the hernia sac, however, the involved loops of bowel appear unremarkable and there is no evidence of obstruction. The stomach, small bowel, and large bowel are otherwise unremarkable. The appendix is not well visualized and may be absent. No free intraperitoneal gas or fluid. Vascular/Lymphatic: The abdominal vasculature is unremarkable. No pathologic adenopathy within the abdomen and pelvis. Reproductive: There is massive enlargement of the prostate gland. Other: Small fat containing left inguinal hernia is present. The rectum is unremarkable. Musculoskeletal: Numerous osseous lytic lesions are again identified and have progressed in the interval since the prior examination particularly within the a pelvis and visualized proximal femurs bilaterally. Pathologic fracture of  L4 is again identified. No acute bone abnormality. IMPRESSION: Massive enlargement of the prostate gland. This likely results in mass effect upon the left ureterovesicular junction contributing to mild left hydronephrosis which appears new since prior examination. Foley decompression  of the bladder lumen is noted. Indeterminate exophytic possibly solid lesion arising from the lower pole of the left kidney, new since prior examination. Given the depth of the lesion, sonography could be utilized, as an initial step, for further evaluation. Interval development of right lower quadrant incisional hernia containing the ileocecal junction. No evidence of obstruction. Interval progression of disease involving numerous osseous lytic metastases. This appears most evident within the pelvis and visualized proximal femurs bilaterally. Stable pathologic fracture of L4. Electronically Signed   By: Fidela Salisbury MD   On: 05/03/2021 06:13    Anti-infectives: Anti-infectives (From admission, onward)   Start     Dose/Rate Route Frequency Ordered Stop   05/03/21 0130  cefTRIAXone (ROCEPHIN) 1 g in sodium chloride 0.9 % 100 mL IVPB        1 g 200 mL/hr over 30 Minutes Intravenous Every 24 hours 05/03/21 0118        Assessment/Plan:  1 - Massive Prostate With Recurrent Gross Hematuria - Likely severe BPH changes made to bleed by superimposed infection. This is major bleeding. Rec 3 way foley ON TRACTION + IRRIGATION, Daily Finasteride (to continue at discharge), AND Loading dose frimagon 240 to maximally medically reduce prostatic growth signals, transfuse PRN, Any procedureal intervention likely would not change course of this other than  prostatic artery embolization (consider if remains refractory despite meds / foly traction) or simple prostatectomy (honestly what I would recommend in elective setting to prevent this from happening again). Pt, wife, son updated on plan and in agreeement.   2 - Urosepsis - agree with current ABX pending additional CX data.   3 - Multifocal, Recurrent Renal Cancer - agree with surveillance, as any procedural intervention would likely tip towards dialysis.   4 - Acute On Chronic Renal Failure / Solitary Left Kidney - pre-renal + low nephron mass.   Overall  rec PRN transufusions,  meds to decrease androgen stimulated prostate growth (I have ordered and arranged with pharmacy, appreciate their help), treat infection as least risky short term options. Consdier embolization if remains completely refractory. Do NOT rec any sort of large surgery this admission in setting of sepsis.   Also rec he recieves GU / Oncology care at single institution, some here some at [facility] results in fragmented / non-coordinated care. He may very well want to transfer care to [place] as his most recent practioner at [facility] has left per report.   Marcus Hatfield 05/04/2021

## 2021-05-04 NOTE — Progress Notes (Signed)
[facility] Progress Note Patient Name: Marcus Hatfield DOB: 1946/07/12 MRN: [id number]   Date of Service  05/04/2021  HPI/Events of Note  Hemoglobin check at midnight , down slightly to 7.8 gm / dl from previous baseline of 8.2 gm / dl.  eICU Interventions  No intervention indicated, will continue to follow hemoglobin, and transfuse for hemoglobin  < 7 gm / dl.        Kerry Kass [name] 05/04/2021, 2:56 AM

## 2021-05-04 NOTE — Progress Notes (Signed)
Patient was complaining of pressure in abdomen and having loose BM. Noticed CBI foley was not draining. Called urologist and got verbal orders to irrigate foley.   Irrigated six times and got multiple different sized clots out. Patient felt relief. Emptied 1200 from foley after draining.

## 2021-05-04 NOTE — Progress Notes (Signed)
PCCM Interval Progress Note  Myself and Dr. Carlis Abbott were contacted by patient's care RN, Ashlyn, re: CBI inability to drain despite flushing and drain repositioning. Advised to contact Urology, recommended clamping inflow of CBI with plan to assess at bedside in several hours, once available. Patient on exam c/o new 10/10 abdominal pain and distention, primarily suprapubic. Vitals demonstrate tachycardia to 140s and hypotension to SBP 90s. 12-Lead EKG ordered. Repeat BMP + Mg and LR 528mL bolus ordered. RN attempted manual irrigation of the catheter x 2 to relieve obstruction without success. Oxybutynin ordered in the event spasm is contributing to patient's pain/symptomology.   Continuing close patient monitoring.  Lestine Mount, PA-C [facility] 05/04/21 1:19 PM  Please see [email or web address] for pager details.  From 7A-7P if no response, please call [phone] After hours, please call ELink [phone]

## 2021-05-04 NOTE — Progress Notes (Signed)
Shift summary: CBI clotted off, unable to drain for several hours, manual extraction of clot resolved low output issues. Renal US. 1 unit prbc transfused, no reaction suspected.   N: AAOx4, pleasant, MAE, afebrile. Tramadol given x1 for 10/10 abdominal pain with urinary retention.   R: RA, clear lungs  CV: ST low 100s, episode of tachycardia, hypotension, diaphoresis, and abdominal pain during CBI clotting. Now BP WDL. 1 unit prbc transfused for low H&H.   GI: NPO majority of day, now cardiac diet and eating well. BG covered with SS insulin. 1 smear BM today.   GU: CBI irrigating pink with frequent clots. Clotted off around 1230 with symptoms for shock, CCM, urology and PA notified. Clamped CBI inflow per urology verbal order. Able to flush and withdraw clot from manual irrigation and CBI continued around 1500 with relief in symptoms. Urology assessing at bedside this evening with plan established, pt and family involved. Foley leaking around catheter.   Skin: No new skin issues.   Ciria Bernardini RN

## 2021-05-04 NOTE — Progress Notes (Signed)
Patient foley not draining and patient feeling extreme discomfort. Per verbal order from urology Pace MD, flushed foley with 120cc and only got 50cc return. CBI clamped at the moment. Pace MD aware of outcome and is coming to see patient.

## 2021-05-05 DIAGNOSIS — R319 Hematuria, unspecified: Secondary | ICD-10-CM | POA: Diagnosis not present

## 2021-05-05 DIAGNOSIS — T83198A Other mechanical complication of other urinary devices and implants, initial encounter: Secondary | ICD-10-CM | POA: Diagnosis not present

## 2021-05-05 DIAGNOSIS — R571 Hypovolemic shock: Secondary | ICD-10-CM

## 2021-05-05 DIAGNOSIS — R338 Other retention of urine: Secondary | ICD-10-CM

## 2021-05-05 DIAGNOSIS — N179 Acute kidney failure, unspecified: Secondary | ICD-10-CM | POA: Diagnosis not present

## 2021-05-05 LAB — BASIC METABOLIC PANEL
Anion gap: 11 (ref 5–15)
BUN: 32 mg/dL — ABNORMAL HIGH (ref 8–23)
CO2: 18 mmol/L — ABNORMAL LOW (ref 22–32)
Calcium: 6.9 mg/dL — ABNORMAL LOW (ref 8.9–10.3)
Chloride: 99 mmol/L (ref 98–111)
Creatinine, Ser: 4.13 mg/dL — ABNORMAL HIGH (ref 0.61–1.24)
GFR, Estimated: 14 mL/min — ABNORMAL LOW (ref >60)
Glucose, Bld: 156 mg/dL — ABNORMAL HIGH (ref 70–99)
Potassium: 5 mmol/L (ref 3.5–5.1)
Sodium: 128 mmol/L — ABNORMAL LOW (ref 135–145)

## 2021-05-05 LAB — MAGNESIUM: Magnesium: 2.1 mg/dL (ref 1.7–2.4)

## 2021-05-05 LAB — CBC
HCT: 17.5 % — ABNORMAL LOW (ref 39.0–52.0)
Hemoglobin: 6.2 g/dL — CL (ref 13.0–17.0)
MCH: 29 pg (ref 26.0–34.0)
MCHC: 35.4 g/dL (ref 30.0–36.0)
MCV: 81.8 fL (ref 80.0–100.0)
Platelets: 205 10*3/uL (ref 150–400)
RBC: 2.14 MIL/uL — ABNORMAL LOW (ref 4.22–5.81)
RDW: 13.9 % (ref 11.5–15.5)
WBC: 25.8 10*3/uL — ABNORMAL HIGH (ref 4.0–10.5)
nRBC: 0 % (ref 0.0–0.2)

## 2021-05-05 LAB — GLUCOSE, CAPILLARY
Glucose-Capillary: 106 mg/dL — ABNORMAL HIGH (ref 70–99)
Glucose-Capillary: 112 mg/dL — ABNORMAL HIGH (ref 70–99)
Glucose-Capillary: 122 mg/dL — ABNORMAL HIGH (ref 70–99)
Glucose-Capillary: 127 mg/dL — ABNORMAL HIGH (ref 70–99)
Glucose-Capillary: 130 mg/dL — ABNORMAL HIGH (ref 70–99)
Glucose-Capillary: 134 mg/dL — ABNORMAL HIGH (ref 70–99)
Glucose-Capillary: 147 mg/dL — ABNORMAL HIGH (ref 70–99)

## 2021-05-05 LAB — LACTIC ACID, PLASMA: Lactic Acid, Venous: 1.8 mmol/L (ref 0.5–1.9)

## 2021-05-05 LAB — PREPARE RBC (CROSSMATCH)

## 2021-05-05 LAB — PHOSPHORUS: Phosphorus: 4 mg/dL (ref 2.5–4.6)

## 2021-05-05 MED ORDER — LACTATED RINGERS IV BOLUS
500.0000 mL | Freq: Once | INTRAVENOUS | Status: AC
  Administered 2021-05-05: 500 mL via INTRAVENOUS

## 2021-05-05 MED ORDER — LACTULOSE 10 GM/15ML PO SOLN
20.0000 g | Freq: Every day | ORAL | Status: DC | PRN
  Administered 2021-05-06: 20 g via ORAL
  Filled 2021-05-05: qty 30

## 2021-05-05 MED ORDER — SODIUM CHLORIDE 0.9% IV SOLUTION: Freq: Once | INTRAVENOUS | Status: AC

## 2021-05-05 NOTE — Progress Notes (Signed)
Progress Note    Marcus Hatfield  PYK:[id number] DOB: [birth year]  DOA: 05/03/2021 PCP: Prince Solian, MD    Brief Narrative:     Medical records reviewed and are as summarized below:  Marcus Hatfield is an 75 y.o. male Marcus Hatfield is a 75 year old gentleman with PMH significant for hypertension, renal cell carcinoma of the right kidney in 2017 at [facility], CKD and  BPH with lower urinary track symptoms.  Patient presented to the ED with complaints of painful abdominal distension, hematuria and failure of foley to drain.  Assessment/Plan:   Active Problems:   AKI (acute kidney injury) ([place])   Hematuria   Acute blood loss anemia from acute GU source-- urology suspects prostate origin  -appreciate urology's assistance; con't CBI: Unfortunately, the patient is not a surgical candidate for a simple prostatectomy at this time.  Prostate artery embolization would be an option of last resort out of concern for its impact on his renal function as that procedure requires a significant amount of IV contrast and could push him into ESRD -transfuse for Hb<7 or hemodynamically significant bleeding  -s/p  4 Units of PRBC -oxycodone PRN for pain  -ok to continue empiric ceftriaxone; con't to follow blood cultures until finalized. Urine culture never collected. -firmagon x 1    AKI on CKD stage 3a due to post-renal obstruction  -con't renal functioning monitoring  -strict I/Os  -appreciate urology's assistance  -renally dose meds, avoid nephrotoxic meds    Acute metabolic acidosis due to AKI  -bicarb enterally, IV as well -daily labs   Hypokalemia  -resolved-- trending up   Hyperglycemia, controlled  -SSI   History of RCC (R kidney s/p nephrectomy 2017 at [facility]) History of multiple myeloma Renal cell carcinoma diagnosed 2017, followed by [facility]. S/p nephrectomy 2017. CT this admission demonstrating indeterminate exophytic ?solid lesion in the lower pole of the L kidney.  CT  also demosntrated numerous osseous lytic lesions (though present since 2017, per chart review). Followed by Heme[facility]Onc at [facility] for Lolita, MM.   Family Communication/Anticipated D/C date and plan/Code Status   DVT prophylaxis: scd Code Status: Full Code.  Family Communication: at bedside Disposition Plan: Status is: Inpatient  Remains inpatient appropriate because:Inpatient level of care appropriate due to severity of illness   Dispo: The patient is from: Home              Anticipated d/c is to: Home              Patient currently is not medically stable to d/c.   Difficult to place patient No      Medical Consultants:    PCCM  urology    Subjective:   No complaints  Objective:    Vitals:   05/05/21 0900 05/05/21 1000 05/05/21 1100 05/05/21 1200  BP: 110/77 116/73 117/78 106/71  Pulse:      Resp: 18 20 (!) 21 19  Temp:   99 F (37.2 C)   TempSrc:   Oral   SpO2: 100% 100% 98% 94%  Weight:      Height:        Intake/Output Summary (Last 24 hours) at 05/05/2021 1226 Last data filed at 05/05/2021 1200 Gross per 24 hour  Intake 14864.95 ml  Output 18150 ml  Net -3285.05 ml   Filed Weights   05/03/21 0128 05/04/21 0414 05/05/21 0349  Weight: 83.9 kg 86.5 kg 84.7 kg    Exam:  General: Appearance:  Overweight male in no acute distress, foley in place     Lungs:      respirations unlabored  Heart:    Normal heart rate.  MS:   All extremities are intact.   Neurologic:   Awake, alert, oriented x 3. No apparent focal neurological           defect.     Data Reviewed:   I have personally reviewed following labs and imaging studies:  Labs: Labs show the following:   Basic Metabolic Panel: Recent Labs  Lab 05/03/21 0029 05/03/21 0052 05/03/21 1036 05/04/21 0720 05/04/21 1350 05/05/21 0055  NA 118* 119* 124* 129* 128* 128*  K 3.3* 3.3* 3.9 3.5 3.3* 5.0  CL 90* 91* 95* 100 97* 99  CO2 9*  --  20* 17* 13* 18*  GLUCOSE 238* 228* 107* 104* 148*  156*  BUN 26* 27* 24* 21 23 32*  CREATININE 3.93* 3.90* 3.02* 2.75* 3.36* 4.13*  CALCIUM 7.4*  --  7.2* 7.5* 7.4* 6.9*  MG  --   --  1.6* 1.9 1.8 2.1  PHOS  --   --  3.5 3.8  --  4.0   GFR Estimated Creatinine Clearance: 15.7 mL/min (A) (by C-G formula based on SCr of 4.13 mg/dL (H)). Liver Function Tests: Recent Labs  Lab 05/03/21 0029  AST 30  ALT 12  ALKPHOS 36*  BILITOT 0.6  PROT 5.7*  ALBUMIN 2.6*   No results for input(s): LIPASE, AMYLASE in the last 168 hours. No results for input(s): AMMONIA in the last 168 hours. Coagulation profile Recent Labs  Lab 05/03/21 0029  INR 1.4*    CBC: Recent Labs  Lab 05/03/21 0029 05/03/21 0052 05/03/21 1036 05/03/21 1743 05/04/21 0031 05/04/21 0720 05/04/21 1350 05/04/21 1800 05/05/21 0556  WBC 24.0*  --  20.0*  --   --  28.6* 31.1*  --  25.8*  NEUTROABS 20.6*  --   --   --   --   --   --   --   --   HGB 6.4*   < > 7.5*   < > 7.8* 7.8* 6.2* 7.2* 6.2*  HCT 20.2*   < > 21.4*   < > 22.5* 22.4* 18.0* 21.6* 17.5*  MCV 90.2  --  83.3  --   --  83.9 85.3  --  81.8  PLT 268  --  184  --   --  194 213  --  205   < > = values in this interval not displayed.   Cardiac Enzymes: No results for input(s): CKTOTAL, CKMB, CKMBINDEX, TROPONINI in the last 168 hours. BNP (last 3 results) No results for input(s): PROBNP in the last 8760 hours. CBG: Recent Labs  Lab 05/04/21 1937 05/05/21 0141 05/05/21 0328 05/05/21 0742 05/05/21 1115  GLUCAP 143* 147* 130* 134* 122*   D-Dimer: No results for input(s): DDIMER in the last 72 hours. Hgb A1c: Recent Labs    05/03/21 0253  HGBA1C 5.8*   Lipid Profile: No results for input(s): CHOL, HDL, LDLCALC, TRIG, CHOLHDL, LDLDIRECT in the last 72 hours. Thyroid function studies: No results for input(s): TSH, T4TOTAL, T3FREE, THYROIDAB in the last 72 hours.  Invalid input(s): FREET3 Anemia work up: No results for input(s): VITAMINB12, FOLATE, FERRITIN, TIBC, IRON, RETICCTPCT in the last  72 hours. Sepsis Labs: Recent Labs  Lab 05/03/21 0324 05/03/21 1036 05/04/21 0720 05/04/21 1350 05/04/21 2200 05/05/21 0556  WBC  --  20.0* 28.6* 31.1*  --  25.8*  LATICACIDVEN 7.9* 1.5  --   --  3.9* 1.8    Microbiology Recent Results (from the past 240 hour(s))  Resp Panel by RT-PCR (Flu A&B, Covid) Nasopharyngeal Swab     Status: None   Collection Time: 05/03/21 12:24 AM   Specimen: Nasopharyngeal Swab; Nasopharyngeal(NP) swabs in vial transport medium  Result Value Ref Range Status   SARS Coronavirus 2 by RT PCR NEGATIVE NEGATIVE Final    Comment: (NOTE) SARS-CoV-2 target nucleic acids are NOT DETECTED.  The SARS-CoV-2 RNA is generally detectable in upper respiratory specimens during the acute phase of infection. The lowest concentration of SARS-CoV-2 viral copies this assay can detect is 138 copies/mL. A negative result does not preclude SARS-Cov-2 infection and should not be used as the sole basis for treatment or other patient management decisions. A negative result may occur with  improper specimen collection/handling, submission of specimen other than nasopharyngeal swab, presence of viral mutation(s) within the areas targeted by this assay, and inadequate number of viral copies(<138 copies/mL). A negative result must be combined with clinical observations, patient history, and epidemiological information. The expected result is Negative.  Fact Sheet for Patients:  EntrepreneurPulse.com.au  Fact Sheet for Healthcare Providers:  IncredibleEmployment.be  This test is no t yet approved or cleared by the [place] FDA and  has been authorized for detection and/or diagnosis of SARS-CoV-2 by FDA under an Emergency Use Authorization (EUA). This EUA will remain  in effect (meaning this test can be used) for the duration of the COVID-19 declaration under Section 564(b)(1) of the Act, 21 U.S.C.section 360bbb-3(b)(1), unless the  authorization is terminated  or revoked sooner.       Influenza A by PCR NEGATIVE NEGATIVE Final   Influenza B by PCR NEGATIVE NEGATIVE Final    Comment: (NOTE) The Xpert Xpress SARS-CoV-2/FLU/RSV plus assay is intended as an aid in the diagnosis of influenza from Nasopharyngeal swab specimens and should not be used as a sole basis for treatment. Nasal washings and aspirates are unacceptable for Xpert Xpress SARS-CoV-2/FLU/RSV testing.  Fact Sheet for Patients: EntrepreneurPulse.com.au  Fact Sheet for Healthcare Providers: IncredibleEmployment.be  This test is not yet approved or cleared by the [place] FDA and has been authorized for detection and/or diagnosis of SARS-CoV-2 by FDA under an Emergency Use Authorization (EUA). This EUA will remain in effect (meaning this test can be used) for the duration of the COVID-19 declaration under Section 564(b)(1) of the Act, 21 U.S.C. section 360bbb-3(b)(1), unless the authorization is terminated or revoked.  Performed at [facility], [place] [address] [place], [place] [address]   Blood Culture (routine x 2)     Status: None (Preliminary result)   Collection Time: 05/03/21  2:10 AM   Specimen: BLOOD RIGHT FOREARM  Result Value Ref Range Status   Specimen Description BLOOD RIGHT FOREARM  Final   Special Requests   Final    BOTTLES DRAWN AEROBIC AND ANAEROBIC Blood Culture adequate volume   Culture   Final    NO GROWTH 2 DAYS Performed at [facility], [place] [address] [place], [place] [address]    Report Status PENDING  Incomplete  MRSA PCR Screening     Status: None   Collection Time: 05/03/21  4:30 AM   Specimen: Nasopharyngeal  Result Value Ref Range Status   MRSA by PCR NEGATIVE NEGATIVE Final    Comment:        The GeneXpert MRSA Assay (FDA approved for NASAL  specimens only), is one component of a comprehensive MRSA colonization surveillance program. It is not intended  to diagnose MRSA infection nor to guide or monitor treatment for MRSA infections. Performed at [facility], [place] [address], [place], [place] [address]   Blood Culture (routine x 2)     Status: None (Preliminary result)   Collection Time: 05/03/21 10:36 AM   Specimen: BLOOD  Result Value Ref Range Status   Specimen Description BLOOD LEFT ANTECUBITAL  Final   Special Requests   Final    BOTTLES DRAWN AEROBIC AND ANAEROBIC Blood Culture results may not be optimal due to an inadequate volume of blood received in culture bottles   Culture   Final    NO GROWTH 2 DAYS Performed at [facility], [place] [address] [place], [place] [address]    Report Status PENDING  Incomplete    Procedures and diagnostic studies:  US RENAL  Result Date: May 16, 2021 CLINICAL DATA:  Gross hematuria. Prior right nephrectomy. Chronic kidney disease. EXAM: RENAL / URINARY TRACT ULTRASOUND COMPLETE COMPARISON:  08/14/2015.  CT 05/03/2021 FINDINGS: Right Kidney: Renal measurements: Prior right nephrectomy Left Kidney: Renal measurements: 12.5 x 6.4 x 6.3 cm = volume: 262 mL. Normal echotexture. Mild fullness of the renal pelvis. Left upper pole cyst measures up to 5 cm. Solid-appearing lesion noted off the lower pole of the left kidney measures up to 3 cm. Bladder: Foley catheter in place. Hypoechoic filling defects within the bladder could reflect mass or hematoma. Other: Markedly enlarged prostate. IMPRESSION: Fullness of the left renal collecting system. Exophytic solid-appearing mass off the lower pole of the left kidney measures 3 cm. This could be further characterized with MRI. Markedly enlarged prostate. Hypoechoic area within the bladder could reflect mass or hematoma. Electronically Signed   By: Rolm Baptise M.D.   On: May 16, 2021 11:09    Medications:   . Chlorhexidine Gluconate Cloth  6 each Topical Q0600  . finasteride  5 mg Oral Daily  . insulin aspart  0-9 Units Subcutaneous Q4H  . sodium  bicarbonate  650 mg Oral BID   Continuous Infusions: . cefTRIAXone (ROCEPHIN)  IV Stopped (05/05/21 0046)  . sodium bicarbonate 150 mEq in D5W infusion 125 mL/hr at 05/05/21 0900     LOS: 2 days   [facility] [facility]   How to contact the [facility] Attending or Consulting provider [place] or covering provider during after hours [place], for this patient?  1. Check the care team in [facility] and look for a) attending/consulting TRH provider listed and b) the [facility] team listed 2. Log into [email or web address] and use [place]'s universal password to access. If you do not have the password, please contact the hospital operator. 3. Locate the [facility] provider you are looking for under [facility] and page to a number that you can be directly reached. 4. If you still have difficulty reaching the provider, please page the [facility] (Director on Call) for the Hospitalists listed on amion for assistance.  05/05/2021, 12:26 PM

## 2021-05-05 NOTE — Progress Notes (Addendum)
  Subjective: Per nursing staff at bedside, his Foley catheter was exchanged last night due to clot retention.  No documentation is in the chart.  Currently the catheter is draining light pink urine with CBI.  H&H stable, but he remains profoundly anemic  Objective: Vital signs in last 24 hours: Temp:  [97.6 F (36.4 C)-99.3 F (37.4 C)] 98 F (36.7 C) (05/18 0848) Pulse Rate:  [88-150] 88 (05/18 0848) Resp:  [14-28] 17 (05/18 0848) BP: (77-116)/(54-91) 114/71 (05/18 0848) SpO2:  [91 %-100 %] 100 % (05/18 0848) Weight:  [84.7 kg] 84.7 kg (05/18 0349)  Intake/Output from previous day: 05/17 0701 - 05/18 0700 In: 20920.7 [I.V.:1434.5; Blood:383.3; IV Piggyback:712.9] Out: 22900 [Urine:22900]  Intake/Output this shift: Total I/O In: 3427.8 [P.O.:60; I.V.:367.8; Other:3000] Out: -   Physical Exam:  General: Alert and oriented Gu: 24 French three-way Foley catheter in place and draining clear to light pink urine with CBI Ext: NT, No erythema  Lab Results: Recent Labs    05/04/21 1350 05/04/21 1800 05/05/21 0556  HGB 6.2* 7.2* 6.2*  HCT 18.0* 21.6* 17.5*   BMET Recent Labs    05/04/21 1350 05/05/21 0055  NA 128* 128*  K 3.3* 5.0  CL 97* 99  CO2 13* 18*  GLUCOSE 148* 156*  BUN 23 32*  CREATININE 3.36* 4.13*  CALCIUM 7.4* 6.9*     Studies/Results: US RENAL  Result Date: 05/04/2021 CLINICAL DATA:  Gross hematuria. Prior right nephrectomy. Chronic kidney disease. EXAM: RENAL / URINARY TRACT ULTRASOUND COMPLETE COMPARISON:  08/14/2015.  CT 05/03/2021 FINDINGS: Right Kidney: Renal measurements: Prior right nephrectomy Left Kidney: Renal measurements: 12.5 x 6.4 x 6.3 cm = volume: 262 mL. Normal echotexture. Mild fullness of the renal pelvis. Left upper pole cyst measures up to 5 cm. Solid-appearing lesion noted off the lower pole of the left kidney measures up to 3 cm. Bladder: Foley catheter in place. Hypoechoic filling defects within the bladder could reflect mass or  hematoma. Other: Markedly enlarged prostate. IMPRESSION: Fullness of the left renal collecting system. Exophytic solid-appearing mass off the lower pole of the left kidney measures 3 cm. This could be further characterized with MRI. Markedly enlarged prostate. Hypoechoic area within the bladder could reflect mass or hematoma. Electronically Signed   By: Rolm Baptise M.D.   On: 05/04/2021 11:09    Assessment/Plan: 75 year old male with twin 250+ gram prostate with recurrent gross hematuria, urosepsis, multifocal and recurrent renal cell carcinoma and acute on chronic renal failure  -Agree with Dr. Zettie Pho plan of starting the patient on Firmagon to hopefully help reduce his prostate size as well as reduce his bleeding risk.  Unfortunately, the patient is not a surgical candidate for a simple prostatectomy at this time.  Prostate artery embolization would be an option of last resort out of concern for its impact on his renal function as that procedure requires a significant amount of IV contrast and could push him into ESRD. -Continue finasteride 5 mg once daily -Flush Foley catheter as needed for obstruction and/or clot burden -I stressed the importance of keeping the CBI running at all times with the nursing staff -Transfuse PRN -Plan discussed with the family at bedside.  -Will continue to monitor   LOS: 2 days   Ellison Hughs, MD [facility] Pager: [phone]  05/05/2021, 10:00 AM

## 2021-05-05 NOTE — ED Notes (Signed)
Dr. Leonette Monarch notified of pt allergy to latex. No new orders received at this time.

## 2021-05-05 NOTE — Progress Notes (Signed)
[place] Progress Note Patient Name: Marcus Hatfield DOB: [birth year] MRN: [id number]   Date of Service  05/05/2021  HPI/Events of Note  Serum lactic acid 3.9.  eICU Interventions  LR 500 ml iv bolus over one hour ordered.        Kerry Kass Syris Brookens 05/05/2021, 1:18 AM

## 2021-05-06 DIAGNOSIS — N179 Acute kidney failure, unspecified: Secondary | ICD-10-CM | POA: Diagnosis not present

## 2021-05-06 DIAGNOSIS — R319 Hematuria, unspecified: Secondary | ICD-10-CM | POA: Diagnosis not present

## 2021-05-06 DIAGNOSIS — R571 Hypovolemic shock: Secondary | ICD-10-CM | POA: Diagnosis not present

## 2021-05-06 LAB — TYPE AND SCREEN
ABO/RH(D): B POS
Antibody Screen: NEGATIVE
Unit division: 0
Unit division: 0
Unit division: 0
Unit division: 0
Unit division: 0
Unit division: 0
Unit division: 0

## 2021-05-06 LAB — BPAM RBC
Blood Product Expiration Date: 202205252359
Blood Product Expiration Date: 202205252359
Blood Product Expiration Date: 202205262359
Blood Product Expiration Date: 202206092359
Blood Product Expiration Date: 202206092359
Blood Product Expiration Date: 202206112359
Blood Product Expiration Date: 202206112359
ISSUE DATE / TIME: 202205160213
ISSUE DATE / TIME: 202205160625
ISSUE DATE / TIME: 202205171607
ISSUE DATE / TIME: 202205180825
ISSUE DATE / TIME: 202205182336
Unit Type and Rh: 7300
Unit Type and Rh: 7300
Unit Type and Rh: 7300
Unit Type and Rh: 7300
Unit Type and Rh: 7300
Unit Type and Rh: 7300
Unit Type and Rh: 7300

## 2021-05-06 LAB — HEMOGLOBIN AND HEMATOCRIT, BLOOD
HCT: 19.6 % — ABNORMAL LOW (ref 39.0–52.0)
Hemoglobin: 6.9 g/dL — CL (ref 13.0–17.0)

## 2021-05-06 LAB — CBC
HCT: 19.9 % — ABNORMAL LOW (ref 39.0–52.0)
Hemoglobin: 7 g/dL — ABNORMAL LOW (ref 13.0–17.0)
MCH: 28.7 pg (ref 26.0–34.0)
MCHC: 35.2 g/dL (ref 30.0–36.0)
MCV: 81.6 fL (ref 80.0–100.0)
Platelets: 208 10*3/uL (ref 150–400)
RBC: 2.44 MIL/uL — ABNORMAL LOW (ref 4.22–5.81)
RDW: 14.1 % (ref 11.5–15.5)
WBC: 24.5 10*3/uL — ABNORMAL HIGH (ref 4.0–10.5)
nRBC: 0.1 % (ref 0.0–0.2)

## 2021-05-06 LAB — BASIC METABOLIC PANEL
Anion gap: 9 (ref 5–15)
BUN: 25 mg/dL — ABNORMAL HIGH (ref 8–23)
CO2: 31 mmol/L (ref 22–32)
Calcium: 6.5 mg/dL — ABNORMAL LOW (ref 8.9–10.3)
Chloride: 93 mmol/L — ABNORMAL LOW (ref 98–111)
Creatinine, Ser: 3.33 mg/dL — ABNORMAL HIGH (ref 0.61–1.24)
GFR, Estimated: 19 mL/min — ABNORMAL LOW (ref >60)
Glucose, Bld: 176 mg/dL — ABNORMAL HIGH (ref 70–99)
Potassium: 3.3 mmol/L — ABNORMAL LOW (ref 3.5–5.1)
Sodium: 133 mmol/L — ABNORMAL LOW (ref 135–145)

## 2021-05-06 LAB — GLUCOSE, CAPILLARY
Glucose-Capillary: 134 mg/dL — ABNORMAL HIGH (ref 70–99)
Glucose-Capillary: 108 mg/dL — ABNORMAL HIGH (ref 70–99)
Glucose-Capillary: 111 mg/dL — ABNORMAL HIGH (ref 70–99)
Glucose-Capillary: 104 mg/dL — ABNORMAL HIGH (ref 70–99)
Glucose-Capillary: 116 mg/dL — ABNORMAL HIGH (ref 70–99)

## 2021-05-06 LAB — PREPARE RBC (CROSSMATCH)

## 2021-05-06 MED ORDER — SODIUM CHLORIDE 0.9% IV SOLUTION: Freq: Once | INTRAVENOUS | Status: DC

## 2021-05-06 MED ORDER — CHLORHEXIDINE GLUCONATE CLOTH 2 % EX PADS
6.0000 | MEDICATED_PAD | Freq: Every day | CUTANEOUS | Status: DC
  Administered 2021-05-07 – 2021-05-11 (×5): 6 via TOPICAL

## 2021-05-06 MED ORDER — POLYETHYLENE GLYCOL 3350 17 G PO PACK
17.0000 g | PACK | Freq: Two times a day (BID) | ORAL | Status: DC
  Administered 2021-05-06 – 2021-05-07 (×2): 17 g via ORAL
  Filled 2021-05-06: qty 1

## 2021-05-06 MED ORDER — POTASSIUM CHLORIDE CRYS ER 20 MEQ PO TBCR
40.0000 meq | EXTENDED_RELEASE_TABLET | Freq: Once | ORAL | Status: AC
  Administered 2021-05-06: 40 meq via ORAL
  Filled 2021-05-06: qty 2

## 2021-05-06 MED ORDER — BISACODYL 10 MG RE SUPP
10.0000 mg | Freq: Once | RECTAL | Status: DC
  Filled 2021-05-06: qty 1

## 2021-05-06 NOTE — Progress Notes (Signed)
Called for catheter not draining. About 600 on bladder scan. On traction today and slow cbi gtt.  NAD Abd soft, nt Catheter in good position, on traction.   Foley irrigated with a 1L sterile water. Equal return - some fresh clots. Urine clear at the end and CBI restarted. Increase gtt rate, was too slow.   Gu to follow. See earlier PN from today for plan of care.

## 2021-05-06 NOTE — Progress Notes (Signed)
Progress Note    Marcus Hatfield  VOJ:[id number] DOB: 1946/02/19  DOA: 05/03/2021 PCP: Prince Solian, MD    Brief Narrative:     Medical records reviewed and are as summarized below:  Marcus Hatfield is an 75 y.o. male Marcus Hatfield is a 75 year old gentleman with PMH significant for hypertension, renal cell carcinoma of the right kidney in 2017 at [facility], CKD and  BPH with lower urinary track symptoms.  Patient presented to the ED with complaints of painful abdominal distension, hematuria and failure of foley to drain.   Assessment/Plan:   Active Problems:   AKI (acute kidney injury) ([place])   Hematuria   Acute blood loss anemia from acute GU source-- urology suspects prostate origin  -appreciate urology's assistance; con't CBI: Unfortunately, the patient is not a surgical candidate for a simple prostatectomy at this time.  Prostate artery embolization would be an option of last resort out of concern for its impact on his renal function as that procedure requires a significant amount of IV contrast and could push him into ESRD -transfuse for Hb<7 or hemodynamically significant bleeding  -s/p  4 Units of PRBC thus far -oxycodone PRN for pain  -ok to continue empiric ceftriaxone; con't to follow blood cultures until finalized. Urine culture never collected. -firmagon x 1    AKI on CKD stage 3a due to post-renal obstruction  -con't renal functioning monitoring  -strict I/Os  -appreciate urology's assistance  -renally dose meds, avoid nephrotoxic meds    Acute metabolic acidosis due to AKI  -bicarb enterally -daily labs   Hypokalemia  -resolved-- trending up   Hyperglycemia, controlled  -SSI   History of RCC (R kidney s/p nephrectomy 2017 at [facility]) History of multiple myeloma Renal cell carcinoma diagnosed 2017, followed by [facility]. S/p nephrectomy 2017. CT this admission demonstrating indeterminate exophytic ?solid lesion in the lower pole of the L kidney.  CT  also demosntrated numerous osseous lytic lesions (though present since 2017, per chart review). Followed by Heme-Onc at [facility] for [place], MM.   Family Communication/Anticipated D/C date and plan/Code Status   DVT prophylaxis: scd Code Status: Full Code.  Family Communication: at bedside Disposition Plan: Status is: Inpatient  Remains inpatient appropriate because:Inpatient level of care appropriate due to severity of illness   Dispo: The patient is from: Home              Anticipated d/c is to: Home              Patient currently is not medically stable to d/c.   Difficult to place patient No      Medical Consultants:    PCCM  urology    Subjective:   No SOB/ no CP  Objective:    Vitals:   05/06/21 0725 05/06/21 0800 05/06/21 0900 05/06/21 1000  BP:  104/76 113/69 114/70  Pulse:      Resp:  13 20 13   Temp: 98.8 F (37.1 C)     TempSrc: Oral     SpO2:  100% 98% 100%  Weight:      Height:        Intake/Output Summary (Last 24 hours) at 05/06/2021 1025 Last data filed at 05/06/2021 0924 Gross per 24 hour  Intake 20935.19 ml  Output 16100 ml  Net 4835.19 ml   Filed Weights   05/04/21 0414 05/05/21 0349 05/06/21 0500  Weight: 86.5 kg 84.7 kg 87.9 kg    Exam:  General: Appearance:  Overweight male in no acute distress     Lungs:     Clear to auscultation bilaterally, respirations unlabored  Heart:    Normal heart rate.    MS:   All extremities are intact.   Neurologic:   Awake, alert, oriented x 3. No apparent focal neurological           defect.      Data Reviewed:   I have personally reviewed following labs and imaging studies:  Labs: Labs show the following:   Basic Metabolic Panel: Recent Labs  Lab 05/03/21 1036 05/04/21 0720 05/04/21 1350 05/05/21 0055 05/06/21 0510  NA 124* 129* 128* 128* 133*  K 3.9 3.5 3.3* 5.0 3.3*  CL 95* 100 97* 99 93*  CO2 20* 17* 13* 18* 31  GLUCOSE 107* 104* 148* 156* 176*  BUN 24* 21 23 32* 25*   CREATININE 3.02* 2.75* 3.36* 4.13* 3.33*  CALCIUM 7.2* 7.5* 7.4* 6.9* 6.5*  MG 1.6* 1.9 1.8 2.1  --   PHOS 3.5 3.8  --  4.0  --    GFR Estimated Creatinine Clearance: 21.4 mL/min (A) (by C-G formula based on SCr of 3.33 mg/dL (H)). Liver Function Tests: Recent Labs  Lab 05/03/21 0029  AST 30  ALT 12  ALKPHOS 36*  BILITOT 0.6  PROT 5.7*  ALBUMIN 2.6*   No results for input(s): LIPASE, AMYLASE in the last 168 hours. No results for input(s): AMMONIA in the last 168 hours. Coagulation profile Recent Labs  Lab 05/03/21 0029  INR 1.4*    CBC: Recent Labs  Lab 05/03/21 0029 05/03/21 0052 05/03/21 1036 05/03/21 1743 05/04/21 0720 05/04/21 1350 05/04/21 1800 05/05/21 0556 05/06/21 0510  WBC 24.0*  --  20.0*  --  28.6* 31.1*  --  25.8* 24.5*  NEUTROABS 20.6*  --   --   --   --   --   --   --   --   HGB 6.4*   < > 7.5*   < > 7.8* 6.2* 7.2* 6.2* 7.0*  HCT 20.2*   < > 21.4*   < > 22.4* 18.0* 21.6* 17.5* 19.9*  MCV 90.2  --  83.3  --  83.9 85.3  --  81.8 81.6  PLT 268  --  184  --  194 213  --  205 208   < > = values in this interval not displayed.   Cardiac Enzymes: No results for input(s): CKTOTAL, CKMB, CKMBINDEX, TROPONINI in the last 168 hours. BNP (last 3 results) No results for input(s): PROBNP in the last 8760 hours. CBG: Recent Labs  Lab 05/05/21 1604 05/05/21 1926 05/05/21 2351 05/06/21 0340 05/06/21 0723  GLUCAP 106* 112* 127* 134* 108*   D-Dimer: No results for input(s): DDIMER in the last 72 hours. Hgb A1c: No results for input(s): HGBA1C in the last 72 hours. Lipid Profile: No results for input(s): CHOL, HDL, LDLCALC, TRIG, CHOLHDL, LDLDIRECT in the last 72 hours. Thyroid function studies: No results for input(s): TSH, T4TOTAL, T3FREE, THYROIDAB in the last 72 hours.  Invalid input(s): FREET3 Anemia work up: No results for input(s): VITAMINB12, FOLATE, FERRITIN, TIBC, IRON, RETICCTPCT in the last 72 hours. Sepsis Labs: Recent Labs  Lab  05/03/21 0324 05/03/21 1036 05/04/21 0720 05/04/21 1350 05/04/21 2200 05/05/21 0556 05/06/21 0510  WBC  --  20.0* 28.6* 31.1*  --  25.8* 24.5*  LATICACIDVEN 7.9* 1.5  --   --  3.9* 1.8  --     Microbiology Recent  Results (from the past 240 hour(s))  Resp Panel by RT-PCR (Flu A&B, Covid) Nasopharyngeal Swab     Status: None   Collection Time: 05/03/21 12:24 AM   Specimen: Nasopharyngeal Swab; Nasopharyngeal(NP) swabs in vial transport medium  Result Value Ref Range Status   SARS Coronavirus 2 by RT PCR NEGATIVE NEGATIVE Final    Comment: (NOTE) SARS-CoV-2 target nucleic acids are NOT DETECTED.  The SARS-CoV-2 RNA is generally detectable in upper respiratory specimens during the acute phase of infection. The lowest concentration of SARS-CoV-2 viral copies this assay can detect is 138 copies/mL. A negative result does not preclude SARS-Cov-2 infection and should not be used as the sole basis for treatment or other patient management decisions. A negative result may occur with  improper specimen collection/handling, submission of specimen other than nasopharyngeal swab, presence of viral mutation(s) within the areas targeted by this assay, and inadequate number of viral copies(<138 copies/mL). A negative result must be combined with clinical observations, patient history, and epidemiological information. The expected result is Negative.  Fact Sheet for Patients:  EntrepreneurPulse.com.au  Fact Sheet for Healthcare Providers:  IncredibleEmployment.be  This test is no t yet approved or cleared by the [place] FDA and  has been authorized for detection and/or diagnosis of SARS-CoV-2 by FDA under an Emergency Use Authorization (EUA). This EUA will remain  in effect (meaning this test can be used) for the duration of the COVID-19 declaration under Section 564(b)(1) of the Act, 21 U.S.C.section 360bbb-3(b)(1), unless the authorization is  terminated  or revoked sooner.       Influenza A by PCR NEGATIVE NEGATIVE Final   Influenza B by PCR NEGATIVE NEGATIVE Final    Comment: (NOTE) The Xpert Xpress SARS-CoV-2/FLU/RSV plus assay is intended as an aid in the diagnosis of influenza from Nasopharyngeal swab specimens and should not be used as a sole basis for treatment. Nasal washings and aspirates are unacceptable for Xpert Xpress SARS-CoV-2/FLU/RSV testing.  Fact Sheet for Patients: EntrepreneurPulse.com.au  Fact Sheet for Healthcare Providers: IncredibleEmployment.be  This test is not yet approved or cleared by the [place] FDA and has been authorized for detection and/or diagnosis of SARS-CoV-2 by FDA under an Emergency Use Authorization (EUA). This EUA will remain in effect (meaning this test can be used) for the duration of the COVID-19 declaration under Section 564(b)(1) of the Act, 21 U.S.C. section 360bbb-3(b)(1), unless the authorization is terminated or revoked.  Performed at [facility], [place] [address], [place], [place] [address]   Blood Culture (routine x 2)     Status: None (Preliminary result)   Collection Time: 05/03/21  2:10 AM   Specimen: BLOOD RIGHT FOREARM  Result Value Ref Range Status   Specimen Description BLOOD RIGHT FOREARM  Final   Special Requests   Final    BOTTLES DRAWN AEROBIC AND ANAEROBIC Blood Culture adequate volume   Culture   Final    NO GROWTH 3 DAYS Performed at [facility], [place] [address], [place], [place] [address]    Report Status PENDING  Incomplete  MRSA PCR Screening     Status: None   Collection Time: 05/03/21  4:30 AM   Specimen: Nasopharyngeal  Result Value Ref Range Status   MRSA by PCR NEGATIVE NEGATIVE Final    Comment:        The GeneXpert MRSA Assay (FDA approved for NASAL specimens only), is one component of a comprehensive MRSA colonization surveillance program. It is not intended to diagnose  MRSA infection nor to guide or monitor treatment for MRSA infections. Performed at [facility], [place] [address], [place], [place] [address]   Blood Culture (routine x 2)     Status: None (Preliminary result)   Collection Time: 05/03/21 10:36 AM   Specimen: BLOOD  Result Value Ref Range Status   Specimen Description BLOOD LEFT ANTECUBITAL  Final   Special Requests   Final    BOTTLES DRAWN AEROBIC AND ANAEROBIC Blood Culture results may not be optimal due to an inadequate volume of blood received in culture bottles   Culture   Final    NO GROWTH 3 DAYS Performed at [facility], [place] [address] [place], [place] [address]    Report Status PENDING  Incomplete    Procedures and diagnostic studies:  US RENAL  Result Date: 05-18-21 CLINICAL DATA:  Gross hematuria. Prior right nephrectomy. Chronic kidney disease. EXAM: RENAL / URINARY TRACT ULTRASOUND COMPLETE COMPARISON:  08/14/2015.  CT 05/03/2021 FINDINGS: Right Kidney: Renal measurements: Prior right nephrectomy Left Kidney: Renal measurements: 12.5 x 6.4 x 6.3 cm = volume: 262 mL. Normal echotexture. Mild fullness of the renal pelvis. Left upper pole cyst measures up to 5 cm. Solid-appearing lesion noted off the lower pole of the left kidney measures up to 3 cm. Bladder: Foley catheter in place. Hypoechoic filling defects within the bladder could reflect mass or hematoma. Other: Markedly enlarged prostate. IMPRESSION: Fullness of the left renal collecting system. Exophytic solid-appearing mass off the lower pole of the left kidney measures 3 cm. This could be further characterized with MRI. Markedly enlarged prostate. Hypoechoic area within the bladder could reflect mass or hematoma. Electronically Signed   By: Rolm Baptise M.D.   On: 05-18-2021 11:09    Medications:   . Chlorhexidine Gluconate Cloth  6 each Topical Q0600  . finasteride  5 mg Oral Daily  . insulin aspart  0-9 Units Subcutaneous Q4H  . sodium bicarbonate   650 mg Oral BID   Continuous Infusions: . cefTRIAXone (ROCEPHIN)  IV Stopped (05/05/21 2335)     LOS: 3 days   Geradine Girt  [facility]   How to contact the [facility] Attending or Consulting provider [place] or covering provider during after hours [place], for this patient?  1. Check the care team in [facility] and look for a) attending/consulting [facility] provider listed and b) the [facility] team listed 2. Log into [email or web address] and use [place]'s universal password to access. If you do not have the password, please contact the hospital operator. 3. Locate the [facility] provider you are looking for under [facility] and page to a number that you can be directly reached. 4. If you still have difficulty reaching the provider, please page the [facility] (Director on Call) for the Hospitalists listed on amion for assistance.  05/06/2021, 10:25 AM

## 2021-05-06 NOTE — Progress Notes (Signed)
Subjective/Chief Complaint:   1 - Massive Prostate With Recurrent Gross Hematuria - 600gm prostate by ER CT 05/02/21 on eval gross hemautira. Negative BX in past and stable high PSA 60-70 range x years. Follows Dr. Greer Pickerel at [facility]. Amazingly not on finasteride at baseline (now started this admission). Firmagon 240 given 5/18 this admission.   2 - Urosepsis - lactic acidosis, fevers, leukocytosis, bacteruria c/w early urosepsis. Placed on empiric rocpehin pending further CX data.  3 - Multifocal, Recurrent Renal Cancer - s/p RIGHT nephrectomy and LEFT renal cryoblation 2017 for low grade papillary cancers. Recurrence 2021 of new LEFT sided lower pole tumor now 3cm by CT 2022, pt has refused ablation.   4 - Acute On Chronic Renal Failure / Solitary Left Kidney - s/p Rt nephrectomy 2017 for cancer. Baseline Cr 1.8. Rise to 3.0 this admission, poor PO intake x few days prior. No sig left hydro of solitary kideny.  Today "[place]" is stable. Remains on bladder irrigation. Recived Firmagon yesterday.   Objective: Vital signs in last 24 hours: Temp:  [98.8 F (37.1 C)-100.1 F (37.8 C)] 99 F (37.2 C) (05/19 1114) Resp:  [13-22] 13 (05/19 1000) BP: (96-129)/(51-85) 114/70 (05/19 1000) SpO2:  [95 %-100 %] 100 % (05/19 1000) Weight:  [87.9 kg] 87.9 kg (05/19 0500) Last BM Date: 05/04/21  Intake/Output from previous day: 05/18 0701 - 05/19 0700 In: 70350 [P.O.:300; I.V.:2956.8; Blood:306.3; IV Piggyback:100] Out: 09381 [Urine:15650] Intake/Output this shift: Total I/O In: 2700 [Other:2700] Out: 2250 [Urine:2250]   General appearance: alert, appears stated age and very pleasant, wife at bedside.  Eyes: negative Nose: Nares normal. Septum midline. Mucosa normal. No drainage or sinus tenderness. Throat: lips, mucosa, and tongue normal; teeth and gums normal Neck: supple, symmetrical, trachea midline Back: symmetric, no curvature. ROM normal. No CVA tenderness. Resp: Non-labored on  minimal [place] O2. Cardio: sinus tach by bedside monitor 110s.  GI: soft, non-tender; bowel sounds normal; no masses,  no organomegaly Male genitalia: normal, 3 way foleyin place on irrigation and traction wtih very light pink urine at 2gtt / sec NS irrigation. Repositioned and 30cc placed in balloon.  Pulses: 2+ and symmetric Skin: Skin color, texture, turgor normal. No rashes or lesions Lymph nodes: Cervical, supraclavicular, and axillary nodes normal. Neurologic: Grossly normal  Lab Results:  Recent Labs    05/05/21 0556 05/06/21 0510  WBC 25.8* 24.5*  HGB 6.2* 7.0*  HCT 17.5* 19.9*  PLT 205 208   BMET Recent Labs    05/05/21 0055 05/06/21 0510  NA 128* 133*  K 5.0 3.3*  CL 99 93*  CO2 18* 31  GLUCOSE 156* 176*  BUN 32* 25*  CREATININE 4.13* 3.33*  CALCIUM 6.9* 6.5*   PT/INR No results for input(s): LABPROT, INR in the last 72 hours. ABG No results for input(s): PHART, HCO3 in the last 72 hours.  Invalid input(s): PCO2, PO2  Studies/Results: No results found.  Anti-infectives: Anti-infectives (From admission, onward)   Start     Dose/Rate Route Frequency Ordered Stop   05/03/21 0130  cefTRIAXone (ROCEPHIN) 1 g in sodium chloride 0.9 % 100 mL IVPB        1 g 200 mL/hr over 30 Minutes Intravenous Every 24 hours 05/03/21 0118        Assessment/Plan:  1 - Massive Prostate With Recurrent Gross Hematuria - Likely severe BPH changes made to bleed by superimposed infection. This is major bleeding. Rec 3 way foley ON TRACTION + IRRIGATION, Daily Finasteride (to continue  at discharge). Hopefully his short term adrogen deprivatoin (firmagon given on 5/18 should last 3 mos) can temporize. Any procedureal intervention likely would not change course of this other than  prostatic artery embolization (consider if remains refractory despite meds / foly traction) or simple prostatectomy (honestly what I would recommend in elective setting to prevent this from happening again).    2 - Urosepsis - agree with current ABX pending additional CX data.   3 - Multifocal, Recurrent Renal Cancer - agree with surveillance, as any procedural intervention would likely tip towards dialysis.   4 - Acute On Chronic Renal Failure / Solitary Left Kidney - pre-renal + low nephron mass.   Overall rec PRN transufusions,  meds to decrease androgen stimulated prostate growth (he is now on). Do NOT rec any sort of large surgery this admission in setting of sepsis.    Alexis Frock 05/06/2021

## 2021-05-06 NOTE — Progress Notes (Signed)
Went into patients room around 1730 to reassess patient and get vitals in order for patient to receive blood. Patient complaining of new onset constipation and mild pain. At this time I noticed that the patient had hard stool in his bed so patient was placed on bedside commode and physician contacted to get additional medications for constipation. Furthermore, I noticed that his foley output was not consistent with the irrigation. Irrigation at this time was clamped and  Urology was paged. Patient bladder scanned and showed over 500 mL in bladder. Dr. Junious Silk with urology answered paged and came to the unit to further assess patient. He flushed foley multiple time and patient had large clots. Per Dr. Junious Silk increase the irrigation to a faster rate to help with clotting issues. Dr. Eliseo Squires was also paged to inform of patient increasing pain and was updated on patient condition.

## 2021-05-07 DIAGNOSIS — N179 Acute kidney failure, unspecified: Secondary | ICD-10-CM | POA: Diagnosis not present

## 2021-05-07 DIAGNOSIS — A419 Sepsis, unspecified organism: Secondary | ICD-10-CM | POA: Diagnosis not present

## 2021-05-07 DIAGNOSIS — R310 Gross hematuria: Secondary | ICD-10-CM | POA: Diagnosis not present

## 2021-05-07 DIAGNOSIS — R6521 Severe sepsis with septic shock: Secondary | ICD-10-CM | POA: Diagnosis not present

## 2021-05-07 LAB — ALBUMIN: Albumin: 1.6 g/dL — ABNORMAL LOW (ref 3.5–5.0)

## 2021-05-07 LAB — GLUCOSE, CAPILLARY
Glucose-Capillary: 114 mg/dL — ABNORMAL HIGH (ref 70–99)
Glucose-Capillary: 156 mg/dL — ABNORMAL HIGH (ref 70–99)
Glucose-Capillary: 76 mg/dL (ref 70–99)
Glucose-Capillary: 84 mg/dL (ref 70–99)
Glucose-Capillary: 85 mg/dL (ref 70–99)
Glucose-Capillary: 96 mg/dL (ref 70–99)

## 2021-05-07 LAB — TYPE AND SCREEN
ABO/RH(D): B POS
Antibody Screen: NEGATIVE
Unit division: 0

## 2021-05-07 LAB — BASIC METABOLIC PANEL
Anion gap: 10 (ref 5–15)
BUN: 24 mg/dL — ABNORMAL HIGH (ref 8–23)
CO2: 28 mmol/L (ref 22–32)
Calcium: 6.3 mg/dL — CL (ref 8.9–10.3)
Chloride: 95 mmol/L — ABNORMAL LOW (ref 98–111)
Creatinine, Ser: 3.11 mg/dL — ABNORMAL HIGH (ref 0.61–1.24)
GFR, Estimated: 20 mL/min — ABNORMAL LOW (ref >60)
Glucose, Bld: 113 mg/dL — ABNORMAL HIGH (ref 70–99)
Potassium: 3.5 mmol/L (ref 3.5–5.1)
Sodium: 133 mmol/L — ABNORMAL LOW (ref 135–145)

## 2021-05-07 LAB — CBC
HCT: 25.3 % — ABNORMAL LOW (ref 39.0–52.0)
Hemoglobin: 8.4 g/dL — ABNORMAL LOW (ref 13.0–17.0)
MCH: 29.5 pg (ref 26.0–34.0)
MCHC: 33.2 g/dL (ref 30.0–36.0)
MCV: 88.8 fL (ref 80.0–100.0)
Platelets: 215 10*3/uL (ref 150–400)
RBC: 2.85 MIL/uL — ABNORMAL LOW (ref 4.22–5.81)
RDW: 15.1 % (ref 11.5–15.5)
WBC: 25 10*3/uL — ABNORMAL HIGH (ref 4.0–10.5)
nRBC: 0.1 % (ref 0.0–0.2)

## 2021-05-07 LAB — BPAM RBC
Blood Product Expiration Date: 202206112359
ISSUE DATE / TIME: 202205191738
Unit Type and Rh: 7300

## 2021-05-07 MED ORDER — CALCIUM GLUCONATE-NACL 1-0.675 GM/50ML-% IV SOLN
1.0000 g | Freq: Once | INTRAVENOUS | Status: AC
  Administered 2021-05-07: 1000 mg via INTRAVENOUS
  Filled 2021-05-07: qty 50

## 2021-05-07 NOTE — Progress Notes (Signed)
Called by RN who reported Calcium of 6.5 on labs this am. Pt is asymptomatic. Has had low calcium past few days but not addressed in notes.  Check Albumin level. Last albumin was 2.6 on 05/03/21.  Corrected calcium is 7.6.  Given 1 gm calcium gluconate

## 2021-05-07 NOTE — Progress Notes (Signed)
  Subjective: Catheter required irrigation last night.  Urine is clear with CBI this morning.  Patient has no complaints this AM.   Objective: Vital signs in last 24 hours: Temp:  [98 F (36.7 C)-99.7 F (37.6 C)] 99 F (37.2 C) (05/20 1113) Pulse Rate:  [97-114] 97 (05/19 2024) Resp:  [11-24] 15 (05/20 0800) BP: (107-133)/(67-90) 124/84 (05/20 0800) SpO2:  [93 %-100 %] 100 % (05/20 0800) Weight:  [88.1 kg] 88.1 kg (05/20 0405)  Intake/Output from previous day: 05/19 0701 - 05/20 0700 In: 19940.8 [P.O.:570; Blood:320.8; IV Piggyback:150] Out: 19500 [SFKCL:27517]  Intake/Output this shift: Total I/O In: 120 [P.O.:120] Out: -   Physical Exam:  General: Alert and oriented GU: Three-way Foley catheter in place and draining clear irrigant  Lab Results: Recent Labs    05/06/21 0510 05/06/21 1605 05/07/21 0046  HGB 7.0* 6.9* 8.4*  HCT 19.9* 19.6* 25.3*   BMET Recent Labs    05/06/21 0510 05/07/21 0046  NA 133* 133*  K 3.3* 3.5  CL 93* 95*  CO2 31 28  GLUCOSE 176* 113*  BUN 25* 24*  CREATININE 3.33* 3.11*  CALCIUM 6.5* 6.3*     Studies/Results: No results found.  Assessment/Plan: 75 year old male with twin 250+ gram prostate with recurrent gross hematuria, urosepsis, multifocal and recurrent renal cell carcinoma and acute on chronic renal failure  -Firmagon injection 05/05/21 -Continue finasteride 5 mg once daily -Flush Foley catheter as needed for obstruction and/or clot burden -Continue CBI -Transfuse PRN -Will continue to monitor   LOS: 4 days   Ellison Hughs, MD [facility] Pager: [phone]  05/07/2021, 1:38 PM

## 2021-05-07 NOTE — Progress Notes (Signed)
Progress Note    Marcus Hatfield  YQI:[id number] DOB: December 07, 1946  DOA: 05/03/2021 PCP: Prince Solian, MD    Brief Narrative:     Medical records reviewed and are as summarized below:  Marcus Hatfield is an 75 y.o. male Marcus Hatfield is a 75 year old gentleman with PMH significant for hypertension, renal cell carcinoma of the right kidney in 2017 at [facility], CKD and  BPH with lower urinary track symptoms.  Patient presented to the ED with complaints of painful abdominal distension, hematuria and failure of foley to drain.    Assessment/Plan:   Active Problems:   AKI (acute kidney injury) ([place])   Hematuria   Acute blood loss anemia from acute GU source-- urology suspects prostate origin  -appreciate urology's assistance; con't CBI: Unfortunately, the patient is not a surgical candidate for a simple prostatectomy at this time.  Prostate artery embolization would be an option of last resort out of concern for its impact on his renal function as that procedure requires a significant amount of IV contrast and could push him into ESRD -transfuse for Hb<7 or hemodynamically significant bleeding  -s/p  5 Units of PRBC thus far -oxycodone PRN for pain  -ok to continue empiric ceftriaxone: urine culture never collected. -firmagon x 1    AKI on CKD stage 3a due to post-renal obstruction  -con't renal functioning monitoring  -strict I/Os  -appreciate urology's assistance  -renally dose meds, avoid nephrotoxic meds    Acute metabolic acidosis due to AKI  -bicarb d/c'd -daily labs   Hypokalemia  -resolved-- trending up   Hyperglycemia, controlled  -SSI   History of RCC (R kidney s/p nephrectomy 2017 at [facility]) History of multiple myeloma Renal cell carcinoma diagnosed 2017, followed by [facility]. S/p nephrectomy 2017. CT this admission demonstrating indeterminate exophytic ?solid lesion in the lower pole of the L kidney.  CT also demosntrated numerous osseous lytic lesions  (though present since 2017, per chart review). Followed by Heme-Onc at [facility] for [place], MM.  Hypocalcemia -corrected calcium 8.2 -already gotten 1 gram of calcium gluconate  Family Communication/Anticipated D/C date and plan/Code Status   DVT prophylaxis: scd Code Status: Full Code.  Family Communication: at bedside Disposition Plan: Status is: Inpatient  Remains inpatient appropriate because:Inpatient level of care appropriate due to severity of illness   Dispo: The patient is from: Home              Anticipated d/c is to: Home              Patient currently is not medically stable to d/c.   Difficult to place patient No      Medical Consultants:    PCCM  urology    Subjective:   No SOB/ no CP  Objective:    Vitals:   05/07/21 0600 05/07/21 0700 05/07/21 0800 05/07/21 1113  BP: 110/74  124/84   Pulse:      Resp: 13  15   Temp:  98 F (36.7 C)  99 F (37.2 C)  TempSrc:  Oral  Oral  SpO2: 100%  100%   Weight:      Height:        Intake/Output Summary (Last 24 hours) at 05/07/2021 1217 Last data filed at 05/07/2021 0900 Gross per 24 hour  Intake 17110.79 ml  Output 17250 ml  Net -139.21 ml   Filed Weights   05/05/21 0349 05/06/21 0500 05/07/21 0405  Weight: 84.7 kg 87.9 kg 88.1 kg  Exam:  General: Appearance:     Overweight male in no acute distress     Lungs:     Clear to auscultation bilaterally, respirations unlabored  Heart:    Normal heart rate. Normal rhythm. No murmurs, rubs, or gallops.   MS:   All extremities are intact.   Neurologic:   Awake, alert, oriented x 3. No apparent focal neurological           defect.       Data Reviewed:   I have personally reviewed following labs and imaging studies:  Labs: Labs show the following:   Basic Metabolic Panel: Recent Labs  Lab 05/03/21 1036 05/04/21 0720 05/04/21 1350 05/05/21 0055 05/06/21 0510 05/07/21 0046  NA 124* 129* 128* 128* 133* 133*  K 3.9 3.5 3.3* 5.0 3.3* 3.5   CL 95* 100 97* 99 93* 95*  CO2 20* 17* 13* 18* 31 28  GLUCOSE 107* 104* 148* 156* 176* 113*  BUN 24* 21 23 32* 25* 24*  CREATININE 3.02* 2.75* 3.36* 4.13* 3.33* 3.11*  CALCIUM 7.2* 7.5* 7.4* 6.9* 6.5* 6.3*  MG 1.6* 1.9 1.8 2.1  --   --   PHOS 3.5 3.8  --  4.0  --   --    GFR Estimated Creatinine Clearance: 22.9 mL/min (A) (by C-G formula based on SCr of 3.11 mg/dL (H)). Liver Function Tests: Recent Labs  Lab 05/03/21 0029 05/07/21 0046  AST 30  --   ALT 12  --   ALKPHOS 36*  --   BILITOT 0.6  --   PROT 5.7*  --   ALBUMIN 2.6* 1.6*   No results for input(s): LIPASE, AMYLASE in the last 168 hours. No results for input(s): AMMONIA in the last 168 hours. Coagulation profile Recent Labs  Lab 05/03/21 0029  INR 1.4*    CBC: Recent Labs  Lab 05/03/21 0029 05/03/21 0052 05/04/21 0720 05/04/21 1350 05/04/21 1800 05/05/21 0556 05/06/21 0510 05/06/21 1605 05/07/21 0046  WBC 24.0*   < > 28.6* 31.1*  --  25.8* 24.5*  --  25.0*  NEUTROABS 20.6*  --   --   --   --   --   --   --   --   HGB 6.4*   < > 7.8* 6.2* 7.2* 6.2* 7.0* 6.9* 8.4*  HCT 20.2*   < > 22.4* 18.0* 21.6* 17.5* 19.9* 19.6* 25.3*  MCV 90.2   < > 83.9 85.3  --  81.8 81.6  --  88.8  PLT 268   < > 194 213  --  205 208  --  215   < > = values in this interval not displayed.   Cardiac Enzymes: No results for input(s): CKTOTAL, CKMB, CKMBINDEX, TROPONINI in the last 168 hours. BNP (last 3 results) No results for input(s): PROBNP in the last 8760 hours. CBG: Recent Labs  Lab 05/06/21 1938 05/07/21 0008 05/07/21 0451 05/07/21 0716 05/07/21 1115  GLUCAP 116* 114* 96 76 84   D-Dimer: No results for input(s): DDIMER in the last 72 hours. Hgb A1c: No results for input(s): HGBA1C in the last 72 hours. Lipid Profile: No results for input(s): CHOL, HDL, LDLCALC, TRIG, CHOLHDL, LDLDIRECT in the last 72 hours. Thyroid function studies: No results for input(s): TSH, T4TOTAL, T3FREE, THYROIDAB in the last 72  hours.  Invalid input(s): FREET3 Anemia work up: No results for input(s): VITAMINB12, FOLATE, FERRITIN, TIBC, IRON, RETICCTPCT in the last 72 hours. Sepsis Labs: Recent Labs  Lab 05/03/21  5183 05/03/21 1036 05/04/21 0720 05/04/21 1350 05/04/21 2200 05/05/21 0556 05/06/21 0510 05/07/21 0046  WBC  --  20.0*   < > 31.1*  --  25.8* 24.5* 25.0*  LATICACIDVEN 7.9* 1.5  --   --  3.9* 1.8  --   --    < > = values in this interval not displayed.    Microbiology Recent Results (from the past 240 hour(s))  Resp Panel by RT-PCR (Flu A&B, Covid) Nasopharyngeal Swab     Status: None   Collection Time: 05/03/21 12:24 AM   Specimen: Nasopharyngeal Swab; Nasopharyngeal(NP) swabs in vial transport medium  Result Value Ref Range Status   SARS Coronavirus 2 by RT PCR NEGATIVE NEGATIVE Final    Comment: (NOTE) SARS-CoV-2 target nucleic acids are NOT DETECTED.  The SARS-CoV-2 RNA is generally detectable in upper respiratory specimens during the acute phase of infection. The lowest concentration of SARS-CoV-2 viral copies this assay can detect is 138 copies/mL. A negative result does not preclude SARS-Cov-2 infection and should not be used as the sole basis for treatment or other patient management decisions. A negative result may occur with  improper specimen collection/handling, submission of specimen other than nasopharyngeal swab, presence of viral mutation(s) within the areas targeted by this assay, and inadequate number of viral copies(<138 copies/mL). A negative result must be combined with clinical observations, patient history, and epidemiological information. The expected result is Negative.  Fact Sheet for Patients:  EntrepreneurPulse.com.au  Fact Sheet for Healthcare Providers:  IncredibleEmployment.be  This test is no t yet approved or cleared by the [place] FDA and  has been authorized for detection and/or diagnosis of SARS-CoV-2  by FDA under an Emergency Use Authorization (EUA). This EUA will remain  in effect (meaning this test can be used) for the duration of the COVID-19 declaration under Section 564(b)(1) of the Act, 21 U.S.C.section 360bbb-3(b)(1), unless the authorization is terminated  or revoked sooner.       Influenza A by PCR NEGATIVE NEGATIVE Final   Influenza B by PCR NEGATIVE NEGATIVE Final    Comment: (NOTE) The Xpert Xpress SARS-CoV-2/FLU/RSV plus assay is intended as an aid in the diagnosis of influenza from Nasopharyngeal swab specimens and should not be used as a sole basis for treatment. Nasal washings and aspirates are unacceptable for Xpert Xpress SARS-CoV-2/FLU/RSV testing.  Fact Sheet for Patients: EntrepreneurPulse.com.au  Fact Sheet for Healthcare Providers: IncredibleEmployment.be  This test is not yet approved or cleared by the [place] FDA and has been authorized for detection and/or diagnosis of SARS-CoV-2 by FDA under an Emergency Use Authorization (EUA). This EUA will remain in effect (meaning this test can be used) for the duration of the COVID-19 declaration under Section 564(b)(1) of the Act, 21 U.S.C. section 360bbb-3(b)(1), unless the authorization is terminated or revoked.  Performed at [facility], [place] [address], [place], [place] [address]   Blood Culture (routine x 2)     Status: None (Preliminary result)   Collection Time: 05/03/21  2:10 AM   Specimen: BLOOD RIGHT FOREARM  Result Value Ref Range Status   Specimen Description BLOOD RIGHT FOREARM  Final   Special Requests   Final    BOTTLES DRAWN AEROBIC AND ANAEROBIC Blood Culture adequate volume   Culture   Final    NO GROWTH 4 DAYS Performed at [facility], [place] [address] [place], [place] [address]    Report Status PENDING  Incomplete  MRSA PCR Screening  Status: None   Collection Time: 05/03/21  4:30 AM   Specimen: Nasopharyngeal  Result  Value Ref Range Status   MRSA by PCR NEGATIVE NEGATIVE Final    Comment:        The GeneXpert MRSA Assay (FDA approved for NASAL specimens only), is one component of a comprehensive MRSA colonization surveillance program. It is not intended to diagnose MRSA infection nor to guide or monitor treatment for MRSA infections. Performed at [facility], [place] [address], [place], [place] [address]   Blood Culture (routine x 2)     Status: None (Preliminary result)   Collection Time: 05/03/21 10:36 AM   Specimen: BLOOD  Result Value Ref Range Status   Specimen Description BLOOD LEFT ANTECUBITAL  Final   Special Requests   Final    BOTTLES DRAWN AEROBIC AND ANAEROBIC Blood Culture results may not be optimal due to an inadequate volume of blood received in culture bottles   Culture   Final    NO GROWTH 4 DAYS Performed at [facility], [place] [address] [place], [place] [address]    Report Status PENDING  Incomplete    Procedures and diagnostic studies:  No results found.  Medications:   . sodium chloride   Intravenous Once  . Chlorhexidine Gluconate Cloth  6 each Topical Q0600  . finasteride  5 mg Oral Daily  . insulin aspart  0-9 Units Subcutaneous Q4H   Continuous Infusions: . cefTRIAXone (ROCEPHIN)  IV Stopped (05/07/21 0008)     LOS: 4 days   Geradine Girt  [facility]   How to contact the [facility] Attending or Consulting provider [place] or covering provider during after hours [place], for this patient?  1. Check the care team in [facility] and look for a) attending/consulting TRH provider listed and b) the [facility] team listed 2. Log into [email or web address] and use [place]'s universal password to access. If you do not have the password, please contact the hospital operator. 3. Locate the [facility] provider you are looking for under [facility] and page to a number that you can be directly reached. 4. If you still have difficulty reaching the provider, please page the  [facility] (Director on Call) for the Hospitalists listed on amion for assistance.  05/07/2021, 12:17 PM

## 2021-05-08 DIAGNOSIS — T83198A Other mechanical complication of other urinary devices and implants, initial encounter: Secondary | ICD-10-CM | POA: Diagnosis not present

## 2021-05-08 DIAGNOSIS — R319 Hematuria, unspecified: Secondary | ICD-10-CM | POA: Diagnosis not present

## 2021-05-08 DIAGNOSIS — N179 Acute kidney failure, unspecified: Secondary | ICD-10-CM | POA: Diagnosis not present

## 2021-05-08 DIAGNOSIS — A419 Sepsis, unspecified organism: Secondary | ICD-10-CM | POA: Diagnosis not present

## 2021-05-08 LAB — BASIC METABOLIC PANEL
Anion gap: 9 (ref 5–15)
BUN: 22 mg/dL (ref 8–23)
CO2: 27 mmol/L (ref 22–32)
Calcium: 6.9 mg/dL — ABNORMAL LOW (ref 8.9–10.3)
Chloride: 99 mmol/L (ref 98–111)
Creatinine, Ser: 2.52 mg/dL — ABNORMAL HIGH (ref 0.61–1.24)
GFR, Estimated: 26 mL/min — ABNORMAL LOW (ref >60)
Glucose, Bld: 87 mg/dL (ref 70–99)
Potassium: 3.3 mmol/L — ABNORMAL LOW (ref 3.5–5.1)
Sodium: 135 mmol/L (ref 135–145)

## 2021-05-08 LAB — GLUCOSE, CAPILLARY
Glucose-Capillary: 114 mg/dL — ABNORMAL HIGH (ref 70–99)
Glucose-Capillary: 125 mg/dL — ABNORMAL HIGH (ref 70–99)
Glucose-Capillary: 138 mg/dL — ABNORMAL HIGH (ref 70–99)
Glucose-Capillary: 94 mg/dL (ref 70–99)
Glucose-Capillary: 97 mg/dL (ref 70–99)

## 2021-05-08 LAB — CBC
HCT: 24.1 % — ABNORMAL LOW (ref 39.0–52.0)
Hemoglobin: 8.1 g/dL — ABNORMAL LOW (ref 13.0–17.0)
MCH: 29.2 pg (ref 26.0–34.0)
MCHC: 33.6 g/dL (ref 30.0–36.0)
MCV: 87 fL (ref 80.0–100.0)
Platelets: 245 10*3/uL (ref 150–400)
RBC: 2.77 MIL/uL — ABNORMAL LOW (ref 4.22–5.81)
RDW: 15.1 % (ref 11.5–15.5)
WBC: 18.4 10*3/uL — ABNORMAL HIGH (ref 4.0–10.5)
nRBC: 0 % (ref 0.0–0.2)

## 2021-05-08 MED ORDER — POTASSIUM CHLORIDE CRYS ER 20 MEQ PO TBCR
40.0000 meq | EXTENDED_RELEASE_TABLET | Freq: Once | ORAL | Status: AC
  Administered 2021-05-08: 40 meq via ORAL
  Filled 2021-05-08: qty 2

## 2021-05-08 MED ORDER — SODIUM CHLORIDE 0.9 % IV SOLN
INTRAVENOUS | Status: DC | PRN
  Administered 2021-05-08 – 2021-05-12 (×3): 250 mL via INTRAVENOUS

## 2021-05-08 NOTE — Progress Notes (Signed)
  Progress Note    Marcus Hatfield  MRN:[id number] DOB: 08/30/1946  DOA: 05/03/2021 PCP: Avva, Ravisankar, MD    Brief Narrative:     Medical records reviewed and are as summarized below:  Marcus Hatfield is an 74 y.o. male Marcus Hatfield is a 74 year old gentleman with PMH significant for hypertension, renal cell carcinoma of the right kidney in 2017 at [facility], CKD and  BPH with lower urinary track symptoms.  Patient presented to the ED with complaints of painful abdominal distension, hematuria and failure of foley to drain.    Assessment/Plan:   Active Problems:   AKI (acute kidney injury) ([facility])   Hematuria   Acute blood loss anemia from acute GU source-- urology suspects prostate origin  -appreciate urology's assistance; Continue CBI, wean as tolerated. -transfuse for Hb<7 or hemodynamically significant bleeding  -s/p  5 Units of PRBC thus far -oxycodone PRN for pain  -ok to continue empiric ceftriaxone: urine culture never collected. -firmagon x 1    AKI on CKD stage 3a due to post-renal obstruction  -con't renal functioning monitoring  -strict I/Os  -appreciate urology's assistance  -renally dose meds, avoid nephrotoxic meds    Acute metabolic acidosis due to AKI  -bicarb d/c'd -daily labs   Hypokalemia  -resolved-- trending up   Hyperglycemia, controlled  -SSI   History of RCC (R kidney s/p nephrectomy 2017 at [facility]) History of multiple myeloma Renal cell carcinoma diagnosed 2017, followed by [facility]. S/p nephrectomy 2017. CT this admission demonstrating indeterminate exophytic ?solid lesion in the lower pole of the L kidney.  CT also demosntrated numerous osseous lytic lesions (though present since 2017, per chart review). Followed by Heme-Onc at [facility] for RCC, MM.  Hypocalcemia -corrected calcium 8.2 -s/p 1 gram of calcium gluconate  Family Communication/Anticipated D/C date and plan/Code Status   DVT prophylaxis: scd Code Status: Full Code.   Family Communication: at bedside Disposition Plan: Status is: Inpatient  Remains inpatient appropriate because:Inpatient level of care appropriate due to severity of illness   Dispo: The patient is from: Home              Anticipated d/c is to: Home              Patient currently is not medically stable to d/c.   Difficult to place patient No      Medical Consultants:    PCCM  urology    Subjective:   No current complaints  Objective:    Vitals:   05/07/21 2004 05/08/21 0034 05/08/21 0441 05/08/21 0824  BP: 124/77 115/78 (!) 141/63 133/85  Pulse: 79 76 74 78  Resp: 20 18 18 18  Temp: 98.9 F (37.2 C)  98.4 F (36.9 C) 99.5 F (37.5 C)  TempSrc: Oral Axillary Oral Oral  SpO2: 100% 100% 100% 100%  Weight:   87.4 kg   Height:        Intake/Output Summary (Last 24 hours) at 05/08/2021 1001 Last data filed at 05/08/2021 0900 Gross per 24 hour  Intake 12240 ml  Output 17850 ml  Net -5610 ml   Filed Weights   05/06/21 0500 05/07/21 0405 05/08/21 0441  Weight: 87.9 kg 88.1 kg 87.4 kg    Exam:  General: Appearance:     Overweight male in no acute distress- urine clear     Lungs:     respirations unlabored  Heart:    Normal heart rate. Normal rhythm. No murmurs, rubs, or gallops.     MS:   All extremities are intact.   Neurologic:   Awake, alert, oriented x 3. No apparent focal neurological           defect.     Data Reviewed:   I have personally reviewed following labs and imaging studies:  Labs: Labs show the following:   Basic Metabolic Panel: Recent Labs  Lab 05/03/21 1036 05/04/21 0720 05/04/21 1350 05/05/21 0055 05/06/21 0510 05/07/21 0046 05/08/21 0206  NA 124* 129* 128* 128* 133* 133* 135  K 3.9 3.5 3.3* 5.0 3.3* 3.5 3.3*  CL 95* 100 97* 99 93* 95* 99  CO2 20* 17* 13* 18* _0 GLUCOSE 107* 104* 148* 156* 176* 113* 87  BUN 24* 21 23 32* 25* 24* 22  CREATININE 3.02* 2.75* 3.36* 4.13* 3.33* 3.11* 2.52*  CALCIUM 7.2* 7.5* 7.4*  6.9* 6.5* 6.3* 6.9*  MG 1.6* 1.9 1.8 2.1  --   --   --   PHOS 3.5 3.8  --  4.0  --   --   --    GFR Estimated Creatinine Clearance: 28.2 mL/min (A) (by C-G formula based on SCr of 2.52 mg/dL (H)). Liver Function Tests: Recent Labs  Lab 05/03/21 0029 05/07/21 0046  AST 30  --   ALT 12  --   ALKPHOS 36*  --   BILITOT 0.6  --   PROT 5.7*  --   ALBUMIN 2.6* 1.6*   No results for input(s): LIPASE, AMYLASE in the last 168 hours. No results for input(s): AMMONIA in the last 168 hours. Coagulation profile Recent Labs  Lab 05/03/21 0029  INR 1.4*    CBC: Recent Labs  Lab 05/03/21 0029 05/03/21 0052 05/04/21 1350 05/04/21 1800 05/05/21 0556 05/06/21 0510 05/06/21 1605 05/07/21 0046 05/08/21 0206  WBC 24.0*   < > 31.1*  --  25.8* 24.5*  --  25.0* 18.4*  NEUTROABS 20.6*  --   --   --   --   --   --   --   --   HGB 6.4*   < > 6.2*   < > 6.2* 7.0* 6.9* 8.4* 8.1*  HCT 20.2*   < > 18.0*   < > 17.5* 19.9* 19.6* 25.3* 24.1*  MCV 90.2   < > 85.3  --  81.8 81.6  --  88.8 87.0  PLT 268   < > 213  --  205 208  --  215 245   < > = values in this interval not displayed.   Cardiac Enzymes: No results for input(s): CKTOTAL, CKMB, CKMBINDEX, TROPONINI in the last 168 hours. BNP (last 3 results) No results for input(s): PROBNP in the last 8760 hours. CBG: Recent Labs  Lab 05/07/21 1611 05/07/21 2010 05/08/21 0014 05/08/21 0525 05/08/21 0815  GLUCAP 85 156* 97 94 125*   D-Dimer: No results for input(s): DDIMER in the last 72 hours. Hgb A1c: No results for input(s): HGBA1C in the last 72 hours. Lipid Profile: No results for input(s): CHOL, HDL, LDLCALC, TRIG, CHOLHDL, LDLDIRECT in the last 72 hours. Thyroid function studies: No results for input(s): TSH, T4TOTAL, T3FREE, THYROIDAB in the last 72 hours.  Invalid input(s): FREET3 Anemia work up: No results for input(s): VITAMINB12, FOLATE, FERRITIN, TIBC, IRON, RETICCTPCT in the last 72 hours. Sepsis Labs: Recent Labs  Lab  05/03/21 0324 05/03/21 1036 05/04/21 0720 05/04/21 2200 05/05/21 0556 05/06/21 0510 05/07/21 0046 05/08/21 0206  WBC  --  20.0*   < >  --  25.8* 24.5* 25.0* 18.4*  LATICACIDVEN 7.9* 1.5  --  3.9* 1.8  --   --   --    < > = values in this interval not displayed.    Microbiology Recent Results (from the past 240 hour(s))  Resp Panel by RT-PCR (Flu A&B, Covid) Nasopharyngeal Swab     Status: None   Collection Time: 05/03/21 12:24 AM   Specimen: Nasopharyngeal Swab; Nasopharyngeal(NP) swabs in vial transport medium  Result Value Ref Range Status   SARS Coronavirus 2 by RT PCR NEGATIVE NEGATIVE Final    Comment: (NOTE) SARS-CoV-2 target nucleic acids are NOT DETECTED.  The SARS-CoV-2 RNA is generally detectable in upper respiratory specimens during the acute phase of infection. The lowest concentration of SARS-CoV-2 viral copies this assay can detect is 138 copies/mL. A negative result does not preclude SARS-Cov-2 infection and should not be used as the sole basis for treatment or other patient management decisions. A negative result may occur with  improper specimen collection/handling, submission of specimen other than nasopharyngeal swab, presence of viral mutation(s) within the areas targeted by this assay, and inadequate number of viral copies(<138 copies/mL). A negative result must be combined with clinical observations, patient history, and epidemiological information. The expected result is Negative.  Fact Sheet for Patients:  https://www.fda.gov/media/152166/download  Fact Sheet for Healthcare Providers:  https://www.fda.gov/media/152162/download  This test is no t yet approved or cleared by the [place] FDA and  has been authorized for detection and/or diagnosis of SARS-CoV-2 by FDA under an Emergency Use Authorization (EUA). This EUA will remain  in effect (meaning this test can be used) for the duration of the COVID-19 declaration under Section 564(b)(1) of  the Act, 21 U.S.C.section 360bbb-3(b)(1), unless the authorization is terminated  or revoked sooner.       Influenza A by PCR NEGATIVE NEGATIVE Final   Influenza B by PCR NEGATIVE NEGATIVE Final    Comment: (NOTE) The Xpert Xpress SARS-CoV-2/FLU/RSV plus assay is intended as an aid in the diagnosis of influenza from Nasopharyngeal swab specimens and should not be used as a sole basis for treatment. Nasal washings and aspirates are unacceptable for Xpert Xpress SARS-CoV-2/FLU/RSV testing.  Fact Sheet for Patients: https://www.fda.gov/media/152166/download  Fact Sheet for Healthcare Providers: https://www.fda.gov/media/152162/download  This test is not yet approved or cleared by the [place] FDA and has been authorized for detection and/or diagnosis of SARS-CoV-2 by FDA under an Emergency Use Authorization (EUA). This EUA will remain in effect (meaning this test can be used) for the duration of the COVID-19 declaration under Section 564(b)(1) of the Act, 21 U.S.C. section 360bbb-3(b)(1), unless the authorization is terminated or revoked.  Performed at [facility], [address] [place], [place] [address]   Blood Culture (routine x 2)     Status: None (Preliminary result)   Collection Time: 05/03/21  2:10 AM   Specimen: BLOOD RIGHT FOREARM  Result Value Ref Range Status   Specimen Description BLOOD RIGHT FOREARM  Final   Special Requests   Final    BOTTLES DRAWN AEROBIC AND ANAEROBIC Blood Culture adequate volume   Culture   Final    NO GROWTH 4 DAYS Performed at [facility], [address] [place], [place] [address]    Report Status PENDING  Incomplete  MRSA PCR Screening     Status: None   Collection Time: 05/03/21  4:30 AM   Specimen: Nasopharyngeal  Result Value Ref Range Status   MRSA by PCR NEGATIVE NEGATIVE   Final    Comment:        The GeneXpert MRSA Assay (FDA approved for NASAL specimens only), is one component of a comprehensive MRSA  colonization surveillance program. It is not intended to diagnose MRSA infection nor to guide or monitor treatment for MRSA infections. Performed at [facility], [place] [address], [place], [place] [address]   Blood Culture (routine x 2)     Status: None (Preliminary result)   Collection Time: 05/03/21 10:36 AM   Specimen: BLOOD  Result Value Ref Range Status   Specimen Description BLOOD LEFT ANTECUBITAL  Final   Special Requests   Final    BOTTLES DRAWN AEROBIC AND ANAEROBIC Blood Culture results may not be optimal due to an inadequate volume of blood received in culture bottles   Culture   Final    NO GROWTH 4 DAYS Performed at [facility], [place] [address], [place], [place] [address]    Report Status PENDING  Incomplete    Procedures and diagnostic studies:  No results found.  Medications:   . sodium chloride   Intravenous Once  . Chlorhexidine Gluconate Cloth  6 each Topical Q0600  . finasteride  5 mg Oral Daily  . insulin aspart  0-9 Units Subcutaneous Q4H  . potassium chloride  40 mEq Oral Once   Continuous Infusions: . sodium chloride 250 mL (05/08/21 0030)  . cefTRIAXone (ROCEPHIN)  IV 1 g (05/08/21 0035)     LOS: 5 days   Geradine Girt  [facility]   How to contact the [facility] Attending or Consulting provider [place] or covering provider during after hours [place], for this patient?  1. Check the care team in [facility] and look for a) attending/consulting TRH provider listed and b) the [facility] team listed 2. Log into [email or web address] and use [place]'s universal password to access. If you do not have the password, please contact the hospital operator. 3. Locate the [facility] provider you are looking for under [facility] and page to a number that you can be directly reached. 4. If you still have difficulty reaching the provider, please page the [facility] (Director on Call) for the Hospitalists listed on amion for assistance.  05/08/2021, 10:01 AM

## 2021-05-08 NOTE — Progress Notes (Signed)
  Subjective: Patient feeling well no pain.  Urine has remained clear on moderate rate CBI and has not required any further manual irrigation.  Urine currently is clear.  Hemoglobin 8.1  Objective: Vital signs in last 24 hours: Temp:  [98.4 F (36.9 C)-99.5 F (37.5 C)] 98.4 F (36.9 C) (05/21 0441) Pulse Rate:  [74-81] 74 (05/21 0441) Resp:  [10-29] 18 (05/21 0441) BP: (115-141)/(63-83) 141/63 (05/21 0441) SpO2:  [96 %-100 %] 100 % (05/21 0441) Weight:  [87.4 kg] 87.4 kg (05/21 0441)  Intake/Output from previous day: 05/20 0701 - 05/21 0700 In: 12120 [P.O.:120] Out: 16300 [Urine:16300] Intake/Output this shift: No intake/output data recorded.  Physical Exam:  General: Alert and oriented GU: Foley in place draining clear urine on moderate rate CBI  Lab Results: Recent Labs    05/06/21 1605 05/07/21 0046 05/08/21 0206  HGB 6.9* 8.4* 8.1*  HCT 19.6* 25.3* 24.1*   BMET Recent Labs    05/07/21 0046 05/08/21 0206  NA 133* 135  K 3.5 3.3*  CL 95* 99  CO2 28 27  GLUCOSE 113* 87  BUN 24* 22  CREATININE 3.11* 2.52*  CALCIUM 6.3* 6.9*     Studies/Results: No results found.  Assessment/Plan: 1.  Gross hematuria likely secondary to BPH Plan/recommendation: Continue CBI, wean as tolerated.  We will follow.    LOS: 5 days   Remi Haggard 05/08/2021, 8:06 AM

## 2021-05-09 DIAGNOSIS — R6521 Severe sepsis with septic shock: Secondary | ICD-10-CM | POA: Diagnosis not present

## 2021-05-09 DIAGNOSIS — T83198A Other mechanical complication of other urinary devices and implants, initial encounter: Secondary | ICD-10-CM | POA: Diagnosis not present

## 2021-05-09 DIAGNOSIS — R310 Gross hematuria: Secondary | ICD-10-CM | POA: Diagnosis not present

## 2021-05-09 DIAGNOSIS — A419 Sepsis, unspecified organism: Secondary | ICD-10-CM | POA: Diagnosis not present

## 2021-05-09 LAB — BASIC METABOLIC PANEL
Anion gap: 6 (ref 5–15)
BUN: 17 mg/dL (ref 8–23)
CO2: 27 mmol/L (ref 22–32)
Calcium: 7.3 mg/dL — ABNORMAL LOW (ref 8.9–10.3)
Chloride: 103 mmol/L (ref 98–111)
Creatinine, Ser: 2.23 mg/dL — ABNORMAL HIGH (ref 0.61–1.24)
GFR, Estimated: 30 mL/min — ABNORMAL LOW (ref >60)
Glucose, Bld: 111 mg/dL — ABNORMAL HIGH (ref 70–99)
Potassium: 3.5 mmol/L (ref 3.5–5.1)
Sodium: 136 mmol/L (ref 135–145)

## 2021-05-09 LAB — CULTURE, BLOOD (ROUTINE X 2)
Culture: NO GROWTH
Culture: NO GROWTH
Special Requests: ADEQUATE

## 2021-05-09 LAB — TYPE AND SCREEN
ABO/RH(D): B POS
Antibody Screen: NEGATIVE

## 2021-05-09 LAB — GLUCOSE, CAPILLARY
Glucose-Capillary: 100 mg/dL — ABNORMAL HIGH (ref 70–99)
Glucose-Capillary: 128 mg/dL — ABNORMAL HIGH (ref 70–99)
Glucose-Capillary: 134 mg/dL — ABNORMAL HIGH (ref 70–99)
Glucose-Capillary: 138 mg/dL — ABNORMAL HIGH (ref 70–99)
Glucose-Capillary: 81 mg/dL (ref 70–99)
Glucose-Capillary: 90 mg/dL (ref 70–99)

## 2021-05-09 LAB — CBC
HCT: 24 % — ABNORMAL LOW (ref 39.0–52.0)
Hemoglobin: 8 g/dL — ABNORMAL LOW (ref 13.0–17.0)
MCH: 29.5 pg (ref 26.0–34.0)
MCHC: 33.3 g/dL (ref 30.0–36.0)
MCV: 88.6 fL (ref 80.0–100.0)
Platelets: 308 10*3/uL (ref 150–400)
RBC: 2.71 MIL/uL — ABNORMAL LOW (ref 4.22–5.81)
RDW: 15.5 % (ref 11.5–15.5)
WBC: 16.9 10*3/uL — ABNORMAL HIGH (ref 4.0–10.5)
nRBC: 0 % (ref 0.0–0.2)

## 2021-05-09 NOTE — Progress Notes (Signed)
  Subjective: Patient resting comfortably, urine is clear on minimal CBI, has not required hand irrigation.  Hemoglobin stable at 8.0  Objective: Vital signs in last 24 hours: Temp:  [98.5 F (36.9 C)-99.9 F (37.7 C)] 99.9 F (37.7 C) (05/22 0408) Pulse Rate:  [70-88] 73 (05/22 0408) Resp:  [17-18] 17 (05/22 0408) BP: (114-133)/(70-85) 121/72 (05/22 0408) SpO2:  [97 %-100 %] 100 % (05/22 0408) Weight:  [84.6 kg] 84.6 kg (05/22 0512)  Intake/Output from previous day: 05/21 0701 - 05/22 0700 In: 18581.8 [P.O.:480; I.V.:1.8; IV Piggyback:100] Out: 19900 [Urine:19900] Intake/Output this shift: No intake/output data recorded.  Physical Exam:  General: Alert and oriented GU: Foley in place draining clear urine minimal CBI  Lab Results: Recent Labs    05/07/21 0046 05/08/21 0206 05/09/21 0403  HGB 8.4* 8.1* 8.0*  HCT 25.3* 24.1* 24.0*   BMET Recent Labs    05/08/21 0206 05/09/21 0403  NA 135 136  K 3.3* 3.5  CL 99 103  CO2 27 27  GLUCOSE 87 111*  BUN 22 17  CREATININE 2.52* 2.23*  CALCIUM 6.9* 7.3*     Studies/Results: No results found.  Assessment/Plan: 1.  Gross hematuria likely BPH related on CBI hemoglobin stabilizing no active bleeding currently Plan/recommendation: We will try to wean CBI to off today hopeful DC CBI tomorrow.    LOS: 6 days   Remi Haggard 05/09/2021, 7:20 AM

## 2021-05-09 NOTE — Progress Notes (Signed)
Progress Note    Marcus Hatfield  ZHG:[id number] DOB: 02-07-1946  DOA: 05/03/2021 PCP: Prince Solian, MD    Brief Narrative:     Medical records reviewed and are as summarized below:  Marcus Hatfield is an 75 y.o. male Marcus Hatfield is a 75 year old gentleman with PMH significant for hypertension, renal cell carcinoma of the right kidney in 2017 at [facility], CKD and  BPH with lower urinary track symptoms.  Patient presented to the ED with complaints of painful abdominal distension, hematuria and failure of foley to drain.    Assessment/Plan:   Active Problems:   AKI (acute kidney injury) ([place])   Hematuria   Acute blood loss anemia from acute GU source-- urology suspects prostate origin  -appreciate urology's assistance; Continue CBI, wean as tolerated. -transfuse for Hb<7 or hemodynamically significant bleeding  -s/p  5 Units of PRBC thus far -oxycodone PRN for pain  -ok to continue empiric ceftriaxone: urine culture never collected. -firmagon x 1    AKI on CKD stage 3a due to post-renal obstruction  -con't renal functioning monitoring  -strict I/Os  -appreciate urology's assistance  -renally dose meds, avoid nephrotoxic meds    Acute metabolic acidosis due to AKI  -bicarb d/c'd -daily labs   Hypokalemia  -resolved-- trending up   Hyperglycemia, controlled  -SSI   History of RCC (R kidney s/p nephrectomy 2017 at [facility]) History of multiple myeloma Renal cell carcinoma diagnosed 2017, followed by [facility]. S/p nephrectomy 2017. CT this admission demonstrating indeterminate exophytic ?solid lesion in the lower pole of the L kidney.  CT also demosntrated numerous osseous lytic lesions (though present since 2017, per chart review). Followed by Heme-Onc at [facility] for [place], MM.  Hypocalcemia -corrected calcium 8.2 -s/p 1 gram of calcium gluconate  Family Communication/Anticipated D/C date and plan/Code Status   DVT prophylaxis: scd Code Status: Full Code.   Family Communication: at bedside Disposition Plan: Status is: Inpatient  Remains inpatient appropriate because:Inpatient level of care appropriate due to severity of illness   Dispo: The patient is from: Home              Anticipated d/c is to: Home              Patient currently is not medically stable to d/c. home in 24-48 hour   Difficult to place patient No      Medical Consultants:    PCCM  urology    Subjective:   Ordering breakfast  Objective:    Vitals:   05/09/21 0009 05/09/21 0408 05/09/21 0512 05/09/21 0748  BP: 124/75 121/72  126/81  Pulse: 75 73  68  Resp: _0 Temp: 99.4 F (37.4 C) 99.9 F (37.7 C)  98.8 F (37.1 C)  TempSrc: Oral Oral  Oral  SpO2: 98% 100%  100%  Weight:   84.6 kg   Height:        Intake/Output Summary (Last 24 hours) at 05/09/2021 0838 Last data filed at 05/09/2021 1962 Gross per 24 hour  Intake 18341.84 ml  Output 21600 ml  Net -3258.16 ml   Filed Weights   05/07/21 0405 05/08/21 0441 05/09/21 0512  Weight: 88.1 kg 87.4 kg 84.6 kg    Exam: In bed, NAD, rrr   Data Reviewed:   I have personally reviewed following labs and imaging studies:  Labs: Labs show the following:   Basic Metabolic Panel: Recent Labs  Lab 05/03/21 1036 05/04/21 0720 05/04/21 1350 05/05/21  2409 05/06/21 0510 05/07/21 0046 05/08/21 0206 05/09/21 0403  NA 124* 129* 128* 128* 133* 133* 135 136  K 3.9 3.5 3.3* 5.0 3.3* 3.5 3.3* 3.5  CL 95* 100 97* 99 93* 95* 99 103  CO2 20* 17* 13* 18* _0 GLUCOSE 107* 104* 148* 156* 176* 113* 87 111*  BUN 24* 21 23 32* 25* 24* 22 17  CREATININE 3.02* 2.75* 3.36* 4.13* 3.33* 3.11* 2.52* 2.23*  CALCIUM 7.2* 7.5* 7.4* 6.9* 6.5* 6.3* 6.9* 7.3*  MG 1.6* 1.9 1.8 2.1  --   --   --   --   PHOS 3.5 3.8  --  4.0  --   --   --   --    GFR Estimated Creatinine Clearance: 29.1 mL/min (A) (by C-G formula based on SCr of 2.23 mg/dL (H)). Liver Function Tests: Recent Labs  Lab  05/03/21 0029 05/07/21 0046  AST 30  --   ALT 12  --   ALKPHOS 36*  --   BILITOT 0.6  --   PROT 5.7*  --   ALBUMIN 2.6* 1.6*   No results for input(s): LIPASE, AMYLASE in the last 168 hours. No results for input(s): AMMONIA in the last 168 hours. Coagulation profile Recent Labs  Lab 05/03/21 0029  INR 1.4*    CBC: Recent Labs  Lab 05/03/21 0029 05/03/21 0052 05/05/21 0556 05/06/21 0510 05/06/21 1605 05/07/21 0046 05/08/21 0206 05/09/21 0403  WBC 24.0*   < > 25.8* 24.5*  --  25.0* 18.4* 16.9*  NEUTROABS 20.6*  --   --   --   --   --   --   --   HGB 6.4*   < > 6.2* 7.0* 6.9* 8.4* 8.1* 8.0*  HCT 20.2*   < > 17.5* 19.9* 19.6* 25.3* 24.1* 24.0*  MCV 90.2   < > 81.8 81.6  --  88.8 87.0 88.6  PLT 268   < > 205 208  --  215 245 308   < > = values in this interval not displayed.   Cardiac Enzymes: No results for input(s): CKTOTAL, CKMB, CKMBINDEX, TROPONINI in the last 168 hours. BNP (last 3 results) No results for input(s): PROBNP in the last 8760 hours. CBG: Recent Labs  Lab 05/08/21 1217 05/08/21 1536 05/09/21 0054 05/09/21 0404 05/09/21 0731  GLUCAP 114* 138* 90 128* 81   D-Dimer: No results for input(s): DDIMER in the last 72 hours. Hgb A1c: No results for input(s): HGBA1C in the last 72 hours. Lipid Profile: No results for input(s): CHOL, HDL, LDLCALC, TRIG, CHOLHDL, LDLDIRECT in the last 72 hours. Thyroid function studies: No results for input(s): TSH, T4TOTAL, T3FREE, THYROIDAB in the last 72 hours.  Invalid input(s): FREET3 Anemia work up: No results for input(s): VITAMINB12, FOLATE, FERRITIN, TIBC, IRON, RETICCTPCT in the last 72 hours. Sepsis Labs: Recent Labs  Lab 05/03/21 0324 05/03/21 1036 05/04/21 0720 05/04/21 2200 05/05/21 0556 05/06/21 0510 05/07/21 0046 05/08/21 0206 05/09/21 0403  WBC  --  20.0*   < >  --  25.8* 24.5* 25.0* 18.4* 16.9*  LATICACIDVEN 7.9* 1.5  --  3.9* 1.8  --   --   --   --    < > = values in this interval not  displayed.    Microbiology Recent Results (from the past 240 hour(s))  Resp Panel by RT-PCR (Flu A&B, Covid) Nasopharyngeal Swab     Status: None   Collection Time: 05/03/21 12:24 AM  Specimen: Nasopharyngeal Swab; Nasopharyngeal(NP) swabs in vial transport medium  Result Value Ref Range Status   SARS Coronavirus 2 by RT PCR NEGATIVE NEGATIVE Final    Comment: (NOTE) SARS-CoV-2 target nucleic acids are NOT DETECTED.  The SARS-CoV-2 RNA is generally detectable in upper respiratory specimens during the acute phase of infection. The lowest concentration of SARS-CoV-2 viral copies this assay can detect is 138 copies/mL. A negative result does not preclude SARS-Cov-2 infection and should not be used as the sole basis for treatment or other patient management decisions. A negative result may occur with  improper specimen collection/handling, submission of specimen other than nasopharyngeal swab, presence of viral mutation(s) within the areas targeted by this assay, and inadequate number of viral copies(<138 copies/mL). A negative result must be combined with clinical observations, patient history, and epidemiological information. The expected result is Negative.  Fact Sheet for Patients:  EntrepreneurPulse.com.au  Fact Sheet for Healthcare Providers:  IncredibleEmployment.be  This test is no t yet approved or cleared by the [place] FDA and  has been authorized for detection and/or diagnosis of SARS-CoV-2 by FDA under an Emergency Use Authorization (EUA). This EUA will remain  in effect (meaning this test can be used) for the duration of the COVID-19 declaration under Section 564(b)(1) of the Act, 21 U.S.C.section 360bbb-3(b)(1), unless the authorization is terminated  or revoked sooner.       Influenza A by PCR NEGATIVE NEGATIVE Final   Influenza B by PCR NEGATIVE NEGATIVE Final    Comment: (NOTE) The Xpert Xpress SARS-CoV-2/FLU/RSV  plus assay is intended as an aid in the diagnosis of influenza from Nasopharyngeal swab specimens and should not be used as a sole basis for treatment. Nasal washings and aspirates are unacceptable for Xpert Xpress SARS-CoV-2/FLU/RSV testing.  Fact Sheet for Patients: EntrepreneurPulse.com.au  Fact Sheet for Healthcare Providers: IncredibleEmployment.be  This test is not yet approved or cleared by the [place] FDA and has been authorized for detection and/or diagnosis of SARS-CoV-2 by FDA under an Emergency Use Authorization (EUA). This EUA will remain in effect (meaning this test can be used) for the duration of the COVID-19 declaration under Section 564(b)(1) of the Act, 21 U.S.C. section 360bbb-3(b)(1), unless the authorization is terminated or revoked.  Performed at [facility], [place] [address] [place], [place] [address]   Blood Culture (routine x 2)     Status: None (Preliminary result)   Collection Time: 05/03/21  2:10 AM   Specimen: BLOOD RIGHT FOREARM  Result Value Ref Range Status   Specimen Description BLOOD RIGHT FOREARM  Final   Special Requests   Final    BOTTLES DRAWN AEROBIC AND ANAEROBIC Blood Culture adequate volume   Culture   Final    NO GROWTH 4 DAYS Performed at [facility], [place] [address] [place], [place] [address]    Report Status PENDING  Incomplete  MRSA PCR Screening     Status: None   Collection Time: 05/03/21  4:30 AM   Specimen: Nasopharyngeal  Result Value Ref Range Status   MRSA by PCR NEGATIVE NEGATIVE Final    Comment:        The GeneXpert MRSA Assay (FDA approved for NASAL specimens only), is one component of a comprehensive MRSA colonization surveillance program. It is not intended to diagnose MRSA infection nor to guide or monitor treatment for MRSA infections. Performed at [facility], [place] [address], [place], [place] [address]   Blood Culture (routine x 2)  Status: None (Preliminary result)   Collection Time: 05/03/21 10:36 AM   Specimen: BLOOD  Result Value Ref Range Status   Specimen Description BLOOD LEFT ANTECUBITAL  Final   Special Requests   Final    BOTTLES DRAWN AEROBIC AND ANAEROBIC Blood Culture results may not be optimal due to an inadequate volume of blood received in culture bottles   Culture   Final    NO GROWTH 4 DAYS Performed at [facility], [place] [address], [place], [place] [address]    Report Status PENDING  Incomplete    Procedures and diagnostic studies:  No results found.  Medications:   . sodium chloride   Intravenous Once  . Chlorhexidine Gluconate Cloth  6 each Topical Q0600  . finasteride  5 mg Oral Daily  . insulin aspart  0-9 Units Subcutaneous Q4H   Continuous Infusions: . sodium chloride 250 mL (05/08/21 0030)  . cefTRIAXone (ROCEPHIN)  IV 1 g (05/09/21 0040)     LOS: 6 days   Geradine Girt  [facility]   How to contact the [facility] Attending or Consulting provider [place] or covering provider during after hours [place], for this patient?  1. Check the care team in [facility] and look for a) attending/consulting TR[facility] provider listed and b) the [facility] team listed 2. Log into [email or web address] and use [place]'s universal password to access. If you do not have the password, please contact the hospital operator. 3. Locate the [facility] provider you are looking for under [facility] and page to a number that you can be directly reached. 4. If you still have difficulty reaching the provider, please page the [facility] (Director on Call) for the Hospitalists listed on amion for assistance.  05/09/2021, 8:38 AM

## 2021-05-10 DIAGNOSIS — T83198A Other mechanical complication of other urinary devices and implants, initial encounter: Secondary | ICD-10-CM | POA: Diagnosis not present

## 2021-05-10 DIAGNOSIS — R310 Gross hematuria: Secondary | ICD-10-CM | POA: Diagnosis not present

## 2021-05-10 DIAGNOSIS — R338 Other retention of urine: Secondary | ICD-10-CM | POA: Diagnosis not present

## 2021-05-10 LAB — GLUCOSE, CAPILLARY
Glucose-Capillary: 100 mg/dL — ABNORMAL HIGH (ref 70–99)
Glucose-Capillary: 90 mg/dL (ref 70–99)
Glucose-Capillary: 87 mg/dL (ref 70–99)
Glucose-Capillary: 126 mg/dL — ABNORMAL HIGH (ref 70–99)
Glucose-Capillary: 74 mg/dL (ref 70–99)
Glucose-Capillary: 125 mg/dL — ABNORMAL HIGH (ref 70–99)

## 2021-05-10 LAB — CBC
HCT: 23 % — ABNORMAL LOW (ref 39.0–52.0)
Hemoglobin: 7.6 g/dL — ABNORMAL LOW (ref 13.0–17.0)
MCH: 29.5 pg (ref 26.0–34.0)
MCHC: 33 g/dL (ref 30.0–36.0)
MCV: 89.1 fL (ref 80.0–100.0)
Platelets: 390 10*3/uL (ref 150–400)
RBC: 2.58 MIL/uL — ABNORMAL LOW (ref 4.22–5.81)
RDW: 15.6 % — ABNORMAL HIGH (ref 11.5–15.5)
WBC: 15.6 10*3/uL — ABNORMAL HIGH (ref 4.0–10.5)
nRBC: 0 % (ref 0.0–0.2)

## 2021-05-10 LAB — BASIC METABOLIC PANEL
Anion gap: 8 (ref 5–15)
BUN: 16 mg/dL (ref 8–23)
CO2: 24 mmol/L (ref 22–32)
Calcium: 7.7 mg/dL — ABNORMAL LOW (ref 8.9–10.3)
Chloride: 106 mmol/L (ref 98–111)
Creatinine, Ser: 2.03 mg/dL — ABNORMAL HIGH (ref 0.61–1.24)
GFR, Estimated: 34 mL/min — ABNORMAL LOW (ref >60)
Glucose, Bld: 84 mg/dL (ref 70–99)
Potassium: 3.8 mmol/L (ref 3.5–5.1)
Sodium: 138 mmol/L (ref 135–145)

## 2021-05-10 MED ORDER — SODIUM CHLORIDE 0.9 % IR SOLN
3000.0000 mL | Status: DC
  Administered 2021-05-10: 3000 mL

## 2021-05-10 NOTE — Care Management Important Message (Signed)
Important Message  Patient Details  Name: Birney Belshe MRN: [id number] Date of Birth: [birth year]   Medicare Important Message Given:  Yes     Shelda Altes 05/10/2021, 10:28 AM

## 2021-05-10 NOTE — Progress Notes (Signed)
  Subjective: Pt is resting comfortably in bed and eating dinner.  No complaints.  He had an episode of hematuria earlier today that has since resolved.  H/H stable  Objective: Vital signs in last 24 hours: Temp:  [98.5 F (36.9 C)-99 F (37.2 C)] 98.5 F (36.9 C) (05/23 1138) Pulse Rate:  [72-87] 80 (05/23 1138) Resp:  [16-18] 18 (05/23 1138) BP: (108-129)/(66-83) 123/76 (05/23 1138) SpO2:  [97 %-100 %] 100 % (05/23 1138)  Intake/Output from previous day: 05/22 0701 - 05/23 0700 In: 18965.4 [P.O.:738; I.V.:27.4; IV Piggyback:200] Out: 23600 [Urine:23600]  Intake/Output this shift: Total I/O In: 15360 [P.O.:360; Other:15000] Out: 12251 [Urine:12250; Stool:1]  Physical Exam:  General: Alert and oriented Gu: 3-way Foley in place and draining clear irrigant Ext: NT, No erythema  Lab Results: Recent Labs    05/08/21 0206 05/09/21 0403 05/10/21 0742  HGB 8.1* 8.0* 7.6*  HCT 24.1* 24.0* 23.0*   BMET Recent Labs    05/09/21 0403 05/10/21 0742  NA 136 138  K 3.5 3.8  CL 103 106  CO2 27 24  GLUCOSE 111* 84  BUN 17 16  CREATININE 2.23* 2.03*  CALCIUM 7.3* 7.7*     Studies/Results: No results found.  Assessment/Plan: 75 year old male with twin250+ gram prostate with recurrent gross hematuria, urosepsis, multifocal and recurrent renal cell carcinoma and acute on chronic renal failure  -Firmagon injection 05/05/21 -Continue finasteride 5 mg once daily -Flush Foley catheter as needed for obstruction and/or clot burden -Restart CBI PRN hematuria -Will make arrangements for the patient to see Dr. Tresa Moore in the office to discuss simple prostatectomy    LOS: 7 days   Ellison Hughs, [facility] Pager: [phone]  05/10/2021, 5:28 PM

## 2021-05-10 NOTE — Progress Notes (Signed)
Progress Note    Marcus Hatfield  YJE:[id number] DOB: [birth year]  DOA: 05/03/2021 PCP: Prince Solian, MD    Brief Narrative:     Medical records reviewed and are as summarized below:  Marcus Hatfield is an 75 y.o. male Marcus Hatfield is a 75 year old gentleman with PMH significant for hypertension, renal cell carcinoma of the right kidney in 2017 at [facility], CKD and  BPH with lower urinary track symptoms.  Patient presented to the ED with complaints of painful abdominal distension, hematuria and failure of foley to drain.    Assessment/Plan:   Active Problems:   AKI (acute kidney injury) ([place])   Hematuria   Acute blood loss anemia from acute GU source-- urology suspects prostate origin  -appreciate urology's assistance; Continue CBI, wean as tolerated. -transfuse for Hb<7 or hemodynamically significant bleeding  -s/p  5 Units of PRBC thus far -oxycodone PRN for pain  -ok to continue empiric ceftriaxone: urine culture never collected. -firmagon x 1    AKI on CKD stage 3a due to post-renal obstruction  -con't renal functioning monitoring  -strict I/Os  -appreciate urology's assistance  -renally dose meds, avoid nephrotoxic meds    Acute metabolic acidosis due to AKI  -bicarb d/c'd -daily labs   Hypokalemia  -resolved-- trending up   Hyperglycemia, controlled  -SSI   History of RCC (R kidney s/p nephrectomy 2017 at [facility]) History of multiple myeloma Renal cell carcinoma diagnosed 2017, followed by [facility]. S/p nephrectomy 2017. CT this admission demonstrating indeterminate exophytic ?solid lesion in the lower pole of the L kidney.  CT also demosntrated numerous osseous lytic lesions (though present since 2017, per chart review). Followed by Heme-Onc at [facility] for [place], [facility].  Hypocalcemia -corrected calcium 8.2 -s/p 1 gram of calcium gluconate  Family Communication/Anticipated D/C date and plan/Code Status   DVT prophylaxis: scd Code Status: Full Code.   Family Communication: at bedside Disposition Plan: Status is: Inpatient  Remains inpatient appropriate because:Inpatient level of care appropriate due to severity of illness   Dispo: The patient is from: Home              Anticipated d/c is to: Home              Patient currently is not medically stable to d/c. home in 24-48 hour   Difficult to place patient No      Medical Consultants:    PCCM  urology    Subjective:   After going to the bathroom developed clots and red urine Denies abdominal pain and pressure  Objective:    Vitals:   05/10/21 0640 05/10/21 0715 05/10/21 0915 05/10/21 1138  BP: 108/66   123/76  Pulse: 72 85 73 80  Resp: 16   18  Temp: 98.9 F (37.2 C)   98.5 F (36.9 C)  TempSrc: Oral   Oral  SpO2: 100% 100% 100% 100%  Weight:      Height:        Intake/Output Summary (Last 24 hours) at 05/10/2021 1243 Last data filed at 05/10/2021 1138 Gross per 24 hour  Intake 21838 ml  Output 23251 ml  Net -1413 ml   Filed Weights   05/07/21 0405 05/08/21 0441 05/09/21 0512  Weight: 88.1 kg 87.4 kg 84.6 kg    Exam:  General: Appearance:     Overweight male in no acute distress- red clots in foley     Lungs:     respirations unlabored  Heart:  Normal heart rate. Normal rhythm. No murmurs, rubs, or gallops.   MS:   All extremities are intact.   Neurologic:   Awake, alert, oriented x 3. No apparent focal neurological           defect.      Data Reviewed:   I have personally reviewed following labs and imaging studies:  Labs: Labs show the following:   Basic Metabolic Panel: Recent Labs  Lab 05/04/21 0720 05/04/21 1350 05/05/21 0055 05/06/21 0510 05/07/21 0046 05/08/21 0206 05/09/21 0403 05/10/21 0742  NA 129* 128* 128* 133* 133* 135 136 138  K 3.5 3.3* 5.0 3.3* 3.5 3.3* 3.5 3.8  CL 100 97* 99 93* 95* 99 103 106  CO2 17* 13* 18* 31 28 27 27 24   GLUCOSE 104* 148* 156* 176* 113* 87 111* 84  BUN 21 23 32* 25* 24* 22 17 16    CREATININE 2.75* 3.36* 4.13* 3.33* 3.11* 2.52* 2.23* 2.03*  CALCIUM 7.5* 7.4* 6.9* 6.5* 6.3* 6.9* 7.3* 7.7*  MG 1.9 1.8 2.1  --   --   --   --   --   PHOS 3.8  --  4.0  --   --   --   --   --    GFR Estimated Creatinine Clearance: 31.9 mL/min (A) (by C-G formula based on SCr of 2.03 mg/dL (H)). Liver Function Tests: Recent Labs  Lab 05/07/21 0046  ALBUMIN 1.6*   No results for input(s): LIPASE, AMYLASE in the last 168 hours. No results for input(s): AMMONIA in the last 168 hours. Coagulation profile No results for input(s): INR, PROTIME in the last 168 hours.  CBC: Recent Labs  Lab 05/06/21 0510 05/06/21 1605 05/07/21 0046 05/08/21 0206 05/09/21 0403 05/10/21 0742  WBC 24.5*  --  25.0* 18.4* 16.9* 15.6*  HGB 7.0* 6.9* 8.4* 8.1* 8.0* 7.6*  HCT 19.9* 19.6* 25.3* 24.1* 24.0* 23.0*  MCV 81.6  --  88.8 87.0 88.6 89.1  PLT 208  --  215 245 308 390   Cardiac Enzymes: No results for input(s): CKTOTAL, CKMB, CKMBINDEX, TROPONINI in the last 168 hours. BNP (last 3 results) No results for input(s): PROBNP in the last 8760 hours. CBG: Recent Labs  Lab 05/09/21 2034 05/10/21 0100 05/10/21 0644 05/10/21 0753 05/10/21 1145  GLUCAP 134* 100* 90 87 126*   D-Dimer: No results for input(s): DDIMER in the last 72 hours. Hgb A1c: No results for input(s): HGBA1C in the last 72 hours. Lipid Profile: No results for input(s): CHOL, HDL, LDLCALC, TRIG, CHOLHDL, LDLDIRECT in the last 72 hours. Thyroid function studies: No results for input(s): TSH, T4TOTAL, T3FREE, THYROIDAB in the last 72 hours.  Invalid input(s): FREET3 Anemia work up: No results for input(s): VITAMINB12, FOLATE, FERRITIN, TIBC, IRON, RETICCTPCT in the last 72 hours. Sepsis Labs: Recent Labs  Lab 05/04/21 2200 05/05/21 0556 05/06/21 0510 05/07/21 0046 05/08/21 0206 05/09/21 0403 05/10/21 0742  WBC  --  25.8*   < > 25.0* 18.4* 16.9* 15.6*  LATICACIDVEN 3.9* 1.8  --   --   --   --   --    < > = values in  this interval not displayed.    Microbiology Recent Results (from the past 240 hour(s))  Resp Panel by RT-PCR (Flu A&B, Covid) Nasopharyngeal Swab     Status: None   Collection Time: 05/03/21 12:24 AM   Specimen: Nasopharyngeal Swab; Nasopharyngeal(NP) swabs in vial transport medium  Result Value Ref Range Status   SARS Coronavirus  2 by RT PCR NEGATIVE NEGATIVE Final    Comment: (NOTE) SARS-CoV-2 target nucleic acids are NOT DETECTED.  The SARS-CoV-2 RNA is generally detectable in upper respiratory specimens during the acute phase of infection. The lowest concentration of SARS-CoV-2 viral copies this assay can detect is 138 copies/mL. A negative result does not preclude SARS-Cov-2 infection and should not be used as the sole basis for treatment or other patient management decisions. A negative result may occur with  improper specimen collection/handling, submission of specimen other than nasopharyngeal swab, presence of viral mutation(s) within the areas targeted by this assay, and inadequate number of viral copies(<138 copies/mL). A negative result must be combined with clinical observations, patient history, and epidemiological information. The expected result is Negative.  Fact Sheet for Patients:  EntrepreneurPulse.com.au  Fact Sheet for Healthcare Providers:  IncredibleEmployment.be  This test is no t yet approved or cleared by the [place] FDA and  has been authorized for detection and/or diagnosis of SARS-CoV-2 by FDA under an Emergency Use Authorization (EUA). This EUA will remain  in effect (meaning this test can be used) for the duration of the COVID-19 declaration under Section 564(b)(1) of the Act, 21 U.S.C.section 360bbb-3(b)(1), unless the authorization is terminated  or revoked sooner.       Influenza A by PCR NEGATIVE NEGATIVE Final   Influenza B by PCR NEGATIVE NEGATIVE Final    Comment: (NOTE) The Xpert Xpress  SARS-CoV-2/FLU/RSV plus assay is intended as an aid in the diagnosis of influenza from Nasopharyngeal swab specimens and should not be used as a sole basis for treatment. Nasal washings and aspirates are unacceptable for Xpert Xpress SARS-CoV-2/FLU/RSV testing.  Fact Sheet for Patients: EntrepreneurPulse.com.au  Fact Sheet for Healthcare Providers: IncredibleEmployment.be  This test is not yet approved or cleared by the [place] FDA and has been authorized for detection and/or diagnosis of SARS-CoV-2 by FDA under an Emergency Use Authorization (EUA). This EUA will remain in effect (meaning this test can be used) for the duration of the COVID-19 declaration under Section 564(b)(1) of the Act, 21 U.S.C. section 360bbb-3(b)(1), unless the authorization is terminated or revoked.  Performed at [facility], [place] [address], [place], [place] [address]   Blood Culture (routine x 2)     Status: None   Collection Time: 05/03/21  2:10 AM   Specimen: BLOOD RIGHT FOREARM  Result Value Ref Range Status   Specimen Description BLOOD RIGHT FOREARM  Final   Special Requests   Final    BOTTLES DRAWN AEROBIC AND ANAEROBIC Blood Culture adequate volume   Culture   Final    NO GROWTH 6 DAYS Performed at [facility], [place] [address], [place], [place] [address]    Report Status 05/09/2021 FINAL  Final  MRSA PCR Screening     Status: None   Collection Time: 05/03/21  4:30 AM   Specimen: Nasopharyngeal  Result Value Ref Range Status   MRSA by PCR NEGATIVE NEGATIVE Final    Comment:        The GeneXpert MRSA Assay (FDA approved for NASAL specimens only), is one component of a comprehensive MRSA colonization surveillance program. It is not intended to diagnose MRSA infection nor to guide or monitor treatment for MRSA infections. Performed at [facility], [place] [address] [place], [place] [address]   Blood Culture (routine x 2)      Status: None   Collection Time: 05/03/21 10:36 AM   Specimen: BLOOD  Result  Value Ref Range Status   Specimen Description BLOOD LEFT ANTECUBITAL  Final   Special Requests   Final    BOTTLES DRAWN AEROBIC AND ANAEROBIC Blood Culture results may not be optimal due to an inadequate volume of blood received in culture bottles   Culture   Final    NO GROWTH 6 DAYS Performed at [place] [address] [place], [place] [address]    Report Status 05/09/2021 FINAL  Final    Procedures and diagnostic studies:  No results found.  Medications:   . sodium chloride   Intravenous Once  . Chlorhexidine Gluconate Cloth  6 each Topical Q0600  . finasteride  5 mg Oral Daily  . insulin aspart  0-9 Units Subcutaneous Q4H   Continuous Infusions: . sodium chloride Stopped (05/09/21 0357)  . cefTRIAXone (ROCEPHIN)  IV 1 g (05/09/21 2354)  . sodium chloride irrigation       LOS: 7 days   Geradine Girt  [facility]   How to contact the [facility] Attending or Consulting provider [place] or covering provider during after hours [place], for this patient?  1. Check the care team in [facility] and look for a) attending/consulting [facility] provider listed and b) the [facility] team listed 2. Log into [email or web address] and use [place] universal password to access. If you do not have the password, please contact the hospital operator. 3. Locate the [facility] provider you are looking for under [facility] and page to a number that you can be directly reached. 4. If you still have difficulty reaching the provider, please page the [facility] (Director on Call) for the Hospitalists listed on amion for assistance.  05/10/2021, 12:43 PM

## 2021-05-11 DIAGNOSIS — R571 Hypovolemic shock: Secondary | ICD-10-CM | POA: Diagnosis not present

## 2021-05-11 DIAGNOSIS — R310 Gross hematuria: Secondary | ICD-10-CM | POA: Diagnosis not present

## 2021-05-11 LAB — BASIC METABOLIC PANEL
Anion gap: 7 (ref 5–15)
BUN: 15 mg/dL (ref 8–23)
CO2: 23 mmol/L (ref 22–32)
Calcium: 7.6 mg/dL — ABNORMAL LOW (ref 8.9–10.3)
Chloride: 108 mmol/L (ref 98–111)
Creatinine, Ser: 1.88 mg/dL — ABNORMAL HIGH (ref 0.61–1.24)
GFR, Estimated: 37 mL/min — ABNORMAL LOW (ref >60)
Glucose, Bld: 89 mg/dL (ref 70–99)
Potassium: 3.8 mmol/L (ref 3.5–5.1)
Sodium: 138 mmol/L (ref 135–145)

## 2021-05-11 LAB — GLUCOSE, CAPILLARY
Glucose-Capillary: 109 mg/dL — ABNORMAL HIGH (ref 70–99)
Glucose-Capillary: 113 mg/dL — ABNORMAL HIGH (ref 70–99)
Glucose-Capillary: 121 mg/dL — ABNORMAL HIGH (ref 70–99)
Glucose-Capillary: 156 mg/dL — ABNORMAL HIGH (ref 70–99)
Glucose-Capillary: 82 mg/dL (ref 70–99)
Glucose-Capillary: 95 mg/dL (ref 70–99)
Glucose-Capillary: 98 mg/dL (ref 70–99)

## 2021-05-11 LAB — CBC
HCT: 22.9 % — ABNORMAL LOW (ref 39.0–52.0)
Hemoglobin: 7.5 g/dL — ABNORMAL LOW (ref 13.0–17.0)
MCH: 29.3 pg (ref 26.0–34.0)
MCHC: 32.8 g/dL (ref 30.0–36.0)
MCV: 89.5 fL (ref 80.0–100.0)
Platelets: 393 10*3/uL (ref 150–400)
RBC: 2.56 MIL/uL — ABNORMAL LOW (ref 4.22–5.81)
RDW: 15.8 % — ABNORMAL HIGH (ref 11.5–15.5)
WBC: 12.2 10*3/uL — ABNORMAL HIGH (ref 4.0–10.5)
nRBC: 0 % (ref 0.0–0.2)

## 2021-05-11 LAB — FERRITIN: Ferritin: 272 ng/mL (ref 24–336)

## 2021-05-11 LAB — IRON AND TIBC
Iron: 15 ug/dL — ABNORMAL LOW (ref 45–182)
Saturation Ratios: 9 % — ABNORMAL LOW (ref 17.9–39.5)
TIBC: 164 ug/dL — ABNORMAL LOW (ref 250–450)
UIBC: 149 ug/dL

## 2021-05-11 NOTE — Progress Notes (Signed)
  Subjective: Urine remains clear.  Patient has no complaints.  H&H stable  Objective: Vital signs in last 24 hours: Temp:  [98.6 F (37 C)-99.9 F (37.7 C)] 99.6 F (37.6 C) (05/24 1119) Pulse Rate:  [72-85] 73 (05/24 1119) Resp:  [18-20] 19 (05/24 1119) BP: (116-120)/(74-81) 116/81 (05/24 1119) SpO2:  [100 %] 100 % (05/24 1119) Weight:  [84.4 kg] 84.4 kg (05/24 0731)  Intake/Output from previous day: 05/23 0701 - 05/24 0700 In: 15360 [P.O.:360] Out: 88110 [RPRXY:[id number]; Stool:1]  Intake/Output this shift: Total I/O In: 840 [P.O.:840] Out: 760 [Urine:760]  Physical Exam:  General: Alert and oriented GU: 24 French three-way Foley catheter in place and draining clear-yellow urine with no CBI  Lab Results: Recent Labs    05/09/21 0403 05/10/21 0742 05/11/21 0312  HGB 8.0* 7.6* 7.5*  HCT 24.0* 23.0* 22.9*   BMET Recent Labs    05/10/21 0742 05/11/21 0312  NA 138 138  K 3.8 3.8  CL 106 108  CO2 24 23  GLUCOSE 84 89  BUN 16 15  CREATININE 2.03* 1.88*  CALCIUM 7.7* 7.6*     Studies/Results: No results found.  Assessment/Plan: 75 year old male with twin250+ gram prostate with recurrent gross hematuria, urosepsis, multifocal and recurrent renal cell carcinoma and acute on chronic renal failure  -Firmagon injection 05/05/21 -Continue finasteride 5 mg once daily -Flush Foley catheter as needed for obstruction and/or clot burden -Restart CBI PRN hematuria -Will make arrangements for the patient to see Dr. Tresa Moore in the office to discuss simple prostatectomy  as well as management of his left renal mass. -Call back as needed   LOS: 8 days   Ellison Hughs, MD [facility] Pager: [phone]  05/11/2021, 4:01 PM

## 2021-05-11 NOTE — Progress Notes (Signed)
Progress Note    Marcus Hatfield  XYB:[id number] DOB: [birth year]  DOA: 05/03/2021 PCP: Prince Solian, MD    Brief Narrative:     Medical records reviewed and are as summarized below:  Marcus Hatfield is an 75 y.o. male Marcus Hatfield is a 75 year old gentleman with PMH significant for hypertension, renal cell carcinoma of the right kidney in 2017 at [facility], CKD and  BPH with lower urinary track symptoms.  Patient presented to the ED with complaints of painful abdominal distension, hematuria and failure of foley to drain.  Has been on CBI for close to 1 week. Stopped on 5/23.  Home in AM if CBC/Fe studies normal    Assessment/Plan:   Active Problems:   AKI (acute kidney injury) ([place])   Hematuria   Acute blood loss anemia from acute GU source-- urology suspects prostate origin  -appreciate urology's assistance; Continue CBI, wean as tolerated. -transfuse for Hb<7 or hemodynamically significant bleeding  -s/p  5 Units of PRBC thus far -oxycodone PRN for pain  -ok to continue empiric ceftriaxone: urine culture never collected. -firmagon x 1 5/18 -leg bag ordered   AKI on CKD stage 3a due to post-renal obstruction  -con't renal functioning monitoring  -strict I/Os  -appreciate urology's assistance  -renally dose meds, avoid nephrotoxic meds    Acute metabolic acidosis due to AKI  -bicarb d/c'd -daily labs   Hypokalemia  -resolved-- trending up   Hyperglycemia -resolved -d/c'd SSI   History of RCC (R kidney s/p nephrectomy 2017 at [facility]) History of multiple myeloma Renal cell carcinoma diagnosed 2017, followed by [facility] S/p nephrectomy 2017. CT this admission demonstrating indeterminate exophytic ?solid lesion in the lower pole of the L kidney.  CT also demosntrated numerous osseous lytic lesions (though present since 2017, per chart review). Followed by Heme-Onc at [facility] for [place], MM.  Hypocalcemia -corrected calcium 8.2 -s/p 1 gram of calcium  gluconate  Family Communication/Anticipated D/C date and plan/Code Status   DVT prophylaxis: scd Code Status: Full Code.  Family Communication: at bedside Disposition Plan: Status is: Inpatient  Remains inpatient appropriate because:Inpatient level of care appropriate due to severity of illness   Dispo: The patient is from: Home              Anticipated d/c is to: Home              Patient currently is not medically stable to d/c. home in AM if CBC/Fe studies ok   Difficult to place patient No      Medical Consultants:    PCCM  urology    Subjective:   Did well with PT, no blood in urine  Objective:    Vitals:   05/10/21 2106 05/11/21 0728 05/11/21 0731 05/11/21 1119  BP: 120/80 117/74  116/81  Pulse: 72 76  73  Resp: 20 18  19   Temp: 98.7 F (37.1 C) 98.6 F (37 C)  99.6 F (37.6 C)  TempSrc: Oral Oral  Oral  SpO2: 100% 100%  100%  Weight:   84.4 kg   Height:        Intake/Output Summary (Last 24 hours) at 05/11/2021 1233 Last data filed at 05/11/2021 1100 Gross per 24 hour  Intake 9840 ml  Output 12810 ml  Net -2970 ml   Filed Weights   05/08/21 0441 05/09/21 0512 05/11/21 0731  Weight: 87.4 kg 84.6 kg 84.4 kg    Exam:  General: Appearance:     Overweight  male in no acute distress     Lungs:     respirations unlabored  Heart:    Normal heart rate. Normal rhythm. No murmurs, rubs, or gallops.   MS:   All extremities are intact.   Neurologic:   Awake, alert      Data Reviewed:   I have personally reviewed following labs and imaging studies:  Labs: Labs show the following:   Basic Metabolic Panel: Recent Labs  Lab 05/04/21 1350 05/05/21 0055 05/06/21 0510 05/07/21 0046 05/08/21 0206 05/09/21 0403 05/10/21 0742 05/11/21 0312  NA 128* 128*   < > 133* 135 136 138 138  K 3.3* 5.0   < > 3.5 3.3* 3.5 3.8 3.8  CL 97* 99   < > 95* 99 103 106 108  CO2 13* 18*   < > 28 27 27 24 23   GLUCOSE 148* 156*   < > 113* 87 111* 84 89  BUN  23 32*   < > 24* 22 17 16 15   CREATININE 3.36* 4.13*   < > 3.11* 2.52* 2.23* 2.03* 1.88*  CALCIUM 7.4* 6.9*   < > 6.3* 6.9* 7.3* 7.7* 7.6*  MG 1.8 2.1  --   --   --   --   --   --   PHOS  --  4.0  --   --   --   --   --   --    < > = values in this interval not displayed.   GFR Estimated Creatinine Clearance: 34.5 mL/min (A) (by C-G formula based on SCr of 1.88 mg/dL (H)). Liver Function Tests: Recent Labs  Lab 05/07/21 0046  ALBUMIN 1.6*   No results for input(s): LIPASE, AMYLASE in the last 168 hours. No results for input(s): AMMONIA in the last 168 hours. Coagulation profile No results for input(s): INR, PROTIME in the last 168 hours.  CBC: Recent Labs  Lab 05/07/21 0046 05/08/21 0206 05/09/21 0403 05/10/21 0742 05/11/21 0312  WBC 25.0* 18.4* 16.9* 15.6* 12.2*  HGB 8.4* 8.1* 8.0* 7.6* 7.5*  HCT 25.3* 24.1* 24.0* 23.0* 22.9*  MCV 88.8 87.0 88.6 89.1 89.5  PLT 215 245 308 390 393   Cardiac Enzymes: No results for input(s): CKTOTAL, CKMB, CKMBINDEX, TROPONINI in the last 168 hours. BNP (last 3 results) No results for input(s): PROBNP in the last 8760 hours. CBG: Recent Labs  Lab 05/10/21 2102 05/11/21 0006 05/11/21 0509 05/11/21 0726 05/11/21 1116  GLUCAP 125* 95 82 113* 98   D-Dimer: No results for input(s): DDIMER in the last 72 hours. Hgb A1c: No results for input(s): HGBA1C in the last 72 hours. Lipid Profile: No results for input(s): CHOL, HDL, LDLCALC, TRIG, CHOLHDL, LDLDIRECT in the last 72 hours. Thyroid function studies: No results for input(s): TSH, T4TOTAL, T3FREE, THYROIDAB in the last 72 hours.  Invalid input(s): FREET3 Anemia work up: Recent Labs    05/11/21 1023  FERRITIN 272  TIBC 164*  IRON 15*   Sepsis Labs: Recent Labs  Lab 05/04/21 2200 05/05/21 0556 05/06/21 0510 05/08/21 0206 05/09/21 0403 05/10/21 0742 05/11/21 0312  WBC  --  25.8*   < > 18.4* 16.9* 15.6* 12.2*  LATICACIDVEN 3.9* 1.8  --   --   --   --   --    < > =  values in this interval not displayed.    Microbiology Recent Results (from the past 240 hour(s))  Resp Panel by RT-PCR (Flu A&B, Covid) Nasopharyngeal Swab  Status: None   Collection Time: 05/03/21 12:24 AM   Specimen: Nasopharyngeal Swab; Nasopharyngeal(NP) swabs in vial transport medium  Result Value Ref Range Status   SARS Coronavirus 2 by RT PCR NEGATIVE NEGATIVE Final    Comment: (NOTE) SARS-CoV-2 target nucleic acids are NOT DETECTED.  The SARS-CoV-2 RNA is generally detectable in upper respiratory specimens during the acute phase of infection. The lowest concentration of SARS-CoV-2 viral copies this assay can detect is 138 copies/mL. A negative result does not preclude SARS-Cov-2 infection and should not be used as the sole basis for treatment or other patient management decisions. A negative result may occur with  improper specimen collection/handling, submission of specimen other than nasopharyngeal swab, presence of viral mutation(s) within the areas targeted by this assay, and inadequate number of viral copies(<138 copies/mL). A negative result must be combined with clinical observations, patient history, and epidemiological information. The expected result is Negative.  Fact Sheet for Patients:  EntrepreneurPulse.com.au  Fact Sheet for Healthcare Providers:  IncredibleEmployment.be  This test is no t yet approved or cleared by the [place] FDA and  has been authorized for detection and/or diagnosis of SARS-CoV-2 by FDA under an Emergency Use Authorization (EUA). This EUA will remain  in effect (meaning this test can be used) for the duration of the COVID-19 declaration under Section 564(b)(1) of the Act, 21 U.S.C.section 360bbb-3(b)(1), unless the authorization is terminated  or revoked sooner.       Influenza A by PCR NEGATIVE NEGATIVE Final   Influenza B by PCR NEGATIVE NEGATIVE Final    Comment: (NOTE) The Xpert  Xpress SARS-CoV-2/FLU/RSV plus assay is intended as an aid in the diagnosis of influenza from Nasopharyngeal swab specimens and should not be used as a sole basis for treatment. Nasal washings and aspirates are unacceptable for Xpert Xpress SARS-CoV-2/FLU/RSV testing.  Fact Sheet for Patients: EntrepreneurPulse.com.au  Fact Sheet for Healthcare Providers: IncredibleEmployment.be  This test is not yet approved or cleared by the [place] FDA and has been authorized for detection and/or diagnosis of SARS-CoV-2 by FDA under an Emergency Use Authorization (EUA). This EUA will remain in effect (meaning this test can be used) for the duration of the COVID-19 declaration under Section 564(b)(1) of the Act, 21 U.S.C. section 360bbb-3(b)(1), unless the authorization is terminated or revoked.  Performed at [facility], [place] [address] [place], [place] [address]   Blood Culture (routine x 2)     Status: None   Collection Time: 05/03/21  2:10 AM   Specimen: BLOOD RIGHT FOREARM  Result Value Ref Range Status   Specimen Description BLOOD RIGHT FOREARM  Final   Special Requests   Final    BOTTLES DRAWN AEROBIC AND ANAEROBIC Blood Culture adequate volume   Culture   Final    NO GROWTH 6 DAYS Performed at [facility], [place] [address], [place], [place] [address]    Report Status 05/09/2021 FINAL  Final  MRSA PCR Screening     Status: None   Collection Time: 05/03/21  4:30 AM   Specimen: Nasopharyngeal  Result Value Ref Range Status   MRSA by PCR NEGATIVE NEGATIVE Final    Comment:        The GeneXpert MRSA Assay (FDA approved for NASAL specimens only), is one component of a comprehensive MRSA colonization surveillance program. It is not intended to diagnose MRSA infection nor to guide or monitor treatment for MRSA infections. Performed at [facility], [place] [address],  [place], [place] [address]   Blood Culture (routine x 2)      Status: None   Collection Time: 05/03/21 10:36 AM   Specimen: BLOOD  Result Value Ref Range Status   Specimen Description BLOOD LEFT ANTECUBITAL  Final   Special Requests   Final    BOTTLES DRAWN AEROBIC AND ANAEROBIC Blood Culture results may not be optimal due to an inadequate volume of blood received in culture bottles   Culture   Final    NO GROWTH 6 DAYS Performed at [facility], [place] [facility] [place] [address], [place], [place] [address]    Report Status 05/09/2021 FINAL  Final    Procedures and diagnostic studies:  No results found.  Medications:   . sodium chloride   Intravenous Once  . Chlorhexidine Gluconate Cloth  6 each Topical Q0600  . finasteride  5 mg Oral Daily  . insulin aspart  0-9 Units Subcutaneous Q4H   Continuous Infusions: . sodium chloride 250 mL (05/11/21 0015)  . cefTRIAXone (ROCEPHIN)  IV 1 g (05/11/21 0017)  . sodium chloride irrigation       LOS: 8 days   Geradine Girt  [facility]   How to contact the [facility] Attending or Consulting provider [place] or covering provider during after hours [place], for this patient?  1. Check the care team in [facility] and look for a) attending/consulting TR[facility] provider listed and b) the [facility] team listed 2. Log into [email or web address] and use [place]'s universal password to access. If you do not have the password, please contact the hospital operator. 3. Locate the [facility] provider you are looking for under [facility] and page to a number that you can be directly reached. 4. If you still have difficulty reaching the provider, please page the [facility] (Director on Call) for the Hospitalists listed on amion for assistance.  05/11/2021, 12:33 PM

## 2021-05-11 NOTE — Evaluation (Signed)
Physical Therapy Evaluation and Discharge Patient Details Name: Marcus Hatfield MRN: [id number] DOB: Dec 21, 1945 Today's Date: 05/11/2021   History of Present Illness  Pt is a 75 y/o male admitted secondary to hematuria and hypotension, likely from urosepsis. Pt with recent presentation to the ED secondary to UTI and had foley catheter placed. PMH includes renal cell carcinoma s/p nephrectomy, HTN, BPH, and CKD.  Clinical Impression  Patient evaluated by Physical Therapy with no further acute PT needs identified. All education has been completed and the patient has no further questions. Pt overall at a mod I to independent level with mobility tasks. No LOB noted during gait and stair navigation. Feel if foley catheter is going to be long term, pt would benefit from leg bag vs the standard bag he has now for ease of mobility. Reports wife is available to assist if needed. See below for any follow-up Physical Therapy or equipment needs. PT is signing off. Thank you for this referral. If needs change, please re-consult.      Follow Up Recommendations No PT follow up    Equipment Recommendations  None recommended by PT    Recommendations for Other Services       Precautions / Restrictions Precautions Precautions: None Restrictions Weight Bearing Restrictions: No      Mobility  Bed Mobility Overal bed mobility: Modified Independent             General bed mobility comments: Increased time    Transfers Overall transfer level: Independent                  Ambulation/Gait Ambulation/Gait assistance: Modified independent (Device/Increase time) Gait Distance (Feet): 200 Feet Assistive device: None;Rolling walker (2 wheeled) Gait Pattern/deviations: WFL(Within Functional Limits) Gait velocity: mildly slower   General Gait Details: Mildly slower gait speed. Used RW for longer distance to hold foley cathether. Was able to ambulate in room without AD and no LOB  noted.  Stairs Stairs: Yes Stairs assistance: Modified independent (Device/Increase time) Stair Management: One rail Left;Step to pattern;Forwards Number of Stairs: 3 General stair comments: Overall steady with stair navigation. No LOB noted.  Wheelchair Mobility    Modified Rankin (Stroke Patients Only)       Balance Overall balance assessment: No apparent balance deficits (not formally assessed)                                           Pertinent Vitals/Pain Pain Assessment: Faces Faces Pain Scale: Hurts a little bit Pain Location: low back Pain Descriptors / Indicators: Aching Pain Intervention(s): Limited activity within patient's tolerance;Monitored during session;Repositioned    Home Living Family/patient expects to be discharged to:: Private residence Living Arrangements: Spouse/significant other Available Help at Discharge: Family Type of Home: House Home Access: Stairs to enter Entrance Stairs-Rails: Right Entrance Stairs-Number of Steps: 3 Home Layout: One level Home Equipment: None      Prior Function Level of Independence: Independent               Hand Dominance        Extremity/Trunk Assessment   Upper Extremity Assessment Upper Extremity Assessment: Overall WFL for tasks assessed    Lower Extremity Assessment Lower Extremity Assessment: Overall WFL for tasks assessed    Cervical / Trunk Assessment Cervical / Trunk Assessment: Normal  Communication   Communication: No difficulties  Cognition Arousal/Alertness: Awake/alert Behavior During Therapy:  WFL for tasks assessed/performed Overall Cognitive Status: Within Functional Limits for tasks assessed                                        General Comments General comments (skin integrity, edema, etc.): Pt's wife present during session    Exercises     Assessment/Plan    PT Assessment Patent does not need any further PT services  PT Problem  List         PT Treatment Interventions      PT Goals (Current goals can be found in the Care Plan section)  Acute Rehab PT Goals Patient Stated Goal: to go home PT Goal Formulation: With patient Time For Goal Achievement: 05/11/21 Potential to Achieve Goals: Good    Frequency     Barriers to discharge        Co-evaluation               AM-PAC PT "6 Clicks" Mobility  Outcome Measure Help needed turning from your back to your side while in a flat bed without using bedrails?: None Help needed moving from lying on your back to sitting on the side of a flat bed without using bedrails?: None Help needed moving to and from a bed to a chair (including a wheelchair)?: None Help needed standing up from a chair using your arms (e.g., wheelchair or bedside chair)?: None Help needed to walk in hospital room?: None Help needed climbing 3-5 steps with a railing? : None 6 Click Score: 24    End of Session   Activity Tolerance: Patient tolerated treatment well Patient left: in chair;with call bell/phone within reach;with family/visitor present Nurse Communication: Mobility status PT Visit Diagnosis: Other abnormalities of gait and mobility (R26.89)    Time: 7619-5093 PT Time Calculation (min) (ACUTE ONLY): 19 min   Charges:   PT Evaluation $PT Eval Low Complexity: 1 Low          Marcus Hatfield, DPT  Acute Rehabilitation Services  Pager: [phone] Office: [phone]   Marcus Hatfield 05/11/2021, 11:18 AM

## 2021-05-12 DIAGNOSIS — N179 Acute kidney failure, unspecified: Secondary | ICD-10-CM | POA: Diagnosis not present

## 2021-05-12 DIAGNOSIS — R319 Hematuria, unspecified: Secondary | ICD-10-CM | POA: Diagnosis not present

## 2021-05-12 LAB — GLUCOSE, CAPILLARY
Glucose-Capillary: 84 mg/dL (ref 70–99)
Glucose-Capillary: 86 mg/dL (ref 70–99)

## 2021-05-12 LAB — CBC
HCT: 22.8 % — ABNORMAL LOW (ref 39.0–52.0)
Hemoglobin: 7.4 g/dL — ABNORMAL LOW (ref 13.0–17.0)
MCH: 29.5 pg (ref 26.0–34.0)
MCHC: 32.5 g/dL (ref 30.0–36.0)
MCV: 90.8 fL (ref 80.0–100.0)
Platelets: 447 10*3/uL — ABNORMAL HIGH (ref 150–400)
RBC: 2.51 MIL/uL — ABNORMAL LOW (ref 4.22–5.81)
RDW: 15.7 % — ABNORMAL HIGH (ref 11.5–15.5)
WBC: 9.9 10*3/uL (ref 4.0–10.5)
nRBC: 0 % (ref 0.0–0.2)

## 2021-05-12 LAB — BASIC METABOLIC PANEL
Anion gap: 9 (ref 5–15)
BUN: 17 mg/dL (ref 8–23)
CO2: 22 mmol/L (ref 22–32)
Calcium: 7.5 mg/dL — ABNORMAL LOW (ref 8.9–10.3)
Chloride: 107 mmol/L (ref 98–111)
Creatinine, Ser: 1.85 mg/dL — ABNORMAL HIGH (ref 0.61–1.24)
GFR, Estimated: 38 mL/min — ABNORMAL LOW (ref >60)
Glucose, Bld: 101 mg/dL — ABNORMAL HIGH (ref 70–99)
Potassium: 3.8 mmol/L (ref 3.5–5.1)
Sodium: 138 mmol/L (ref 135–145)

## 2021-05-12 MED ORDER — FERROUS SULFATE 325 (65 FE) MG PO TABS: 325.0000 mg | ORAL_TABLET | Freq: Two times a day (BID) | ORAL | 0 refills | Status: DC

## 2021-05-12 MED ORDER — FINASTERIDE 5 MG PO TABS: 5.0000 mg | ORAL_TABLET | Freq: Every day | ORAL | 0 refills | Status: AC

## 2021-05-12 MED ORDER — OXYBUTYNIN CHLORIDE 5 MG PO TABS: 5.0000 mg | ORAL_TABLET | Freq: Three times a day (TID) | ORAL | 0 refills | Status: AC | PRN

## 2021-05-12 NOTE — Discharge Instructions (Signed)
Indwelling Urinary Catheter Care, Adult An indwelling urinary catheter is a thin, flexible, germ-free (sterile) tube that is placed into the bladder to help drain urine out of the body. The catheter is inserted into the part of the body that drains urine from the bladder (urethra). Urine drains from the catheter into a drainage bag outside of the body. Taking good care of your catheter will keep it working properly and help to prevent problems from developing. What are the risks?  Bacteria may get into your bladder and cause a urinary tract infection.  Urine flow can become blocked. This can happen if the catheter is not working correctly, or if you have sediment or a blood clot in your bladder or the catheter.  Tissue near the catheter may become irritated and bleed. How to wear your catheter and your drainage bag Supplies needed  Adhesive tape or a leg strap.  Alcohol wipe or soap and water (if you use tape).  A clean towel (if you use tape).  Overnight drainage bag.  Smaller drainage bag (leg bag). Wearing your catheter and bag Use adhesive tape or a leg strap to attach your catheter to your leg.  Make sure the catheter is not pulled tight.  If a leg strap gets wet, replace it with a dry one.  If you use adhesive tape: 1. Use an alcohol wipe or soap and water to wash off any stickiness on your skin where you had tape before. 2. Use a clean towel to pat-dry the area. 3. Apply the new tape. You should have received a large overnight drainage bag and a smaller leg bag that fits underneath clothing.  You may wear the overnight bag at any time, but you should not wear the leg bag at night.  Always wear the leg bag below your knee.  Make sure the overnight drainage bag is always lower than the level of your bladder, but do not let it touch the floor. Before you go to sleep, hang the bag inside a wastebasket that is covered by a clean plastic bag. How to care for your skin around  the catheter Supplies needed  A clean washcloth.  Water and mild soap.  A clean towel. Caring for your skin and catheter  Every day, use a clean washcloth and soapy water to clean the skin around your catheter. 1. Wash your hands with soap and water. 2. Wet a washcloth in warm water and mild soap. 3. Clean the skin around your urethra.  If you are male:  Use one hand to gently spread the folds of skin around your vagina (labia).  With the washcloth in your other hand, wipe the inner side of your labia on each side. Do this in a front-to-back direction.  If you are male:  Use one hand to pull back any skin that covers the end of your penis (foreskin).  With the washcloth in your other hand, wipe your penis in small circles. Start wiping at the tip of your penis, then move outward from the catheter.  Move the foreskin back in place, if this applies. 4. With your free hand, hold the catheter close to where it enters your body. Keep holding the catheter during cleaning so it does not get pulled out. 5. Use your other hand to clean the catheter with the washcloth.  Only wipe downward on the catheter.  Do not wipe upward toward your body, because that may push bacteria into your urethra and cause infection. 6.   Use a clean towel to pat-dry the catheter and the skin around it. Make sure to wipe off all soap. 7. Wash your hands with soap and water.  Shower every day. Do not take baths.  Do not use cream, ointment, or lotion on the area where the catheter enters your body, unless your health care provider tells you to do that.  Do not use powders, sprays, or lotions on your genital area.  Check your skin around the catheter every day for signs of infection. Check for: ? Redness, swelling, or pain. ? Fluid or blood. ? Warmth. ? Pus or a bad smell.      How to empty the drainage bag Supplies needed  Rubbing alcohol.  Gauze pad or cotton ball.  Adhesive tape or a leg  strap. Emptying the bag Empty your drainage bag (your overnight drainage bag or your leg bag) when it is ?- full, or at least 2-3 times a day. Clean the drainage bag according to the manufacturer's instructions or as told by your health care provider. 1. Wash your hands with soap and water. 2. Detach the drainage bag from your leg. 3. Hold the drainage bag over the toilet or a clean container. Make sure the drainage bag is lower than your hips and bladder. This stops urine from going back into the tubing and into your bladder. 4. Open the pour spout at the bottom of the bag. 5. Empty the urine into the toilet or container. Do not let the pour spout touch any surface. This precaution is important to prevent bacteria from getting in the bag and causing infection. 6. Apply rubbing alcohol to a gauze pad or cotton ball. 7. Use the gauze pad or cotton ball to clean the pour spout. 8. Close the pour spout. 9. Attach the bag to your leg with adhesive tape or a leg strap. 10. Wash your hands with soap and water. How to change the drainage bag Supplies needed:  Alcohol wipes.  A clean drainage bag.  Adhesive tape or a leg strap. Changing the bag Replace your drainage bag with a clean bag if it leaks, starts to smell bad, or looks dirty. 1. Wash your hands with soap and water. 2. Detach the dirty drainage bag from your leg. 3. Pinch the catheter with your fingers so that urine does not spill out. 4. Disconnect the catheter tube from the drainage tube at the connection valve. Do not let the tubes touch any surface. 5. Clean the end of the catheter tube with an alcohol wipe. Use a different alcohol wipe to clean the end of the drainage tube. 6. Connect the catheter tube to the drainage tube of the clean bag. 7. Attach the clean bag to your leg with adhesive tape or a leg strap. Avoid attaching the new bag too tightly. 8. Wash your hands with soap and water. General instructions  Never pull on  your catheter or try to remove it. Pulling can damage your internal tissues.  Always wash your hands before and after you handle your catheter or drainage bag. Use a mild, fragrance-free soap. If soap and water are not available, use hand sanitizer.  Always make sure there are no twists or bends (kinks) in the catheter tube.  Always make sure there are no leaks in the catheter or drainage bag.  Drink enough fluid to keep your urine pale yellow.  Do not take baths, swim, or use a hot tub.  If you are male, wipe from   front to back after having a bowel movement.   Contact a health care provider if:  Your urine is cloudy.  Your urine smells unusually bad.  Your catheter gets clogged.  Your catheter starts to leak.  Your bladder feels full. Get help right away if:  You have redness, swelling, or pain where the catheter enters your body.  You have fluid, blood, pus, or a bad smell coming from the area where the catheter enters your body.  The area where the catheter enters your body feels warm to the touch.  You have a fever.  You have pain in your abdomen, legs, lower back, or bladder.  You see blood in the catheter.  Your urine is pink or red.  You have nausea, vomiting, or chills.  Your urine is not draining into the bag.  Your catheter gets pulled out. Summary  An indwelling urinary catheter is a thin, flexible, germ-free (sterile) tube that is placed into the bladder to help drain urine out of the body.  The catheter is inserted into the part of the body that drains urine from the bladder (urethra).  Take good care of your catheter to keep it working properly and help prevent problems from developing.  Always wash your hands before and after you handle your catheter or drainage bag.  Never pull on your catheter or try to remove it. This information is not intended to replace advice given to you by your health care provider. Make sure you discuss any questions  you have with your health care provider. Document Revised: 02/10/2020 Document Reviewed: 07/21/2017 [employer] Patient Education  2021 [employer]  

## 2021-05-12 NOTE — Discharge Summary (Signed)
Physician Discharge Summary  Elazar Argabright ZHG:[id number] DOB: March 08, 1946 DOA: 05/03/2021  PCP: Prince Solian, MD  Admit date: 05/03/2021 Discharge date: 05/12/2021  Admitted From: Home Disposition: Home  Recommendations for Outpatient Follow-up:  1. Follow up with PCP in 1-2 weeks 2. Follow-up with urology, Dr. Tresa Moore in 2 weeks 3. Started ferrous sulfate 325 mg p.o. twice daily for low iron related to acute blood loss anemia 4. Please obtain BMP/CBC in one week to assess hemoglobin and renal function 5. Discontinued amlodipine, furosemide, ARB secondary to borderline hypotension and acute renal failure 6. Consider MR for further evaluation of solid-appearing mass left lower pole kidney  Home Health: No Equipment/Devices: Foley catheter  Discharge Condition: Stable CODE STATUS: Full code Diet recommendation: Heart healthy diet  History of present illness:  Marcus Hatfield ss a 75 year old male with past medical history significant for essential hypertension, renal cell carcinoma right kidney 2017 managed at [facility], CKD stage IIIb, BPH who presented to the ED with painful abdominal distention, hematuria and failure of Foley catheter to drain.  Patient was first seen in urgent care on 04/16/2021 and treated for urinary tract infection.  On 4/30 2022 patient presents to the ED with urinary retention and a Foley catheter was placed; patient was subsequently discharged home with outpatient urology follow-up.  On 05/01/2021, patient returned to the ED with urinary retention, his Foley catheter had been removed a few days prior and a new Foley was placed and patient was discharged home on Keflex and outpatient urology follow-up.  Representing once again to the ED on 05/03/2021 with abdominal distention, hematuria and once again Foley catheter with failure to drain.  On arrival to the ED, patient was noted to be hypotensive with decreased hemoglobin from 9.9 to 6.5 over a 48-hour  period.  Neurology was consulted urgently and bladder was lavaged cleaning multiple clots and gross blood.  Patient was initially admitted to the PCCM service due to hemorrhagic shock, in which he received blood transfusions with stabilization of his blood pressure, blood loss.  Patient was transferred to the hospital service from [facility] on 05/05/2021.   Hospital course:  Hemorrhagic shock secondary to acute blood loss anemia Patient's presenting to the ED with persistence obstructive uropathy despite multiple Foley exchanges and now with gross hematuria.  Was found to have hemoglobin 6.5.  Was seen by urology urgently with bedside lavage of bladder with multiple clots and gross blood removed.  Patient was transfused total 5 unit PRBCs during hospitalization, with stabilization of hemoglobin.  Hemoglobin 7.4 at time of discharge.  Iron level 15, will start iron supplementation on discharge.  Recommend repeat CBC 1 week.  Septic shock secondary to pyelonephritis Urine on 4/29 with large leukocytes, positive nitrite.  Started on antibiotics outpatient.  WBC elevated 24.0 on admission.  Noted to be hypotensive with a lactic acid of 10. Initially admitted to the [facility] service urinalysis with no significant growth, although on antibiotics prior to hospitalization.  Completed 10-day course of ceftriaxone inpatient.  WBC 9.9 at time of discharge.  Recurrent acute urinary retention Gross hematuria Patient representing to ED after multiple failed Foley catheter placements and voiding trial with persistent abdominal pain and now gross hematuria.  CT abdomen/pelvis with massive enlargement of the prostate gland likely resulting in mass-effect on the left ureterovesicular junction contributing to mild left hydronephrosis.  Was seen by urology on admission and lavaged at bedside with removal of blood clots and gross blood.  Received firmagon on 05/05/21. Patient  remained on complete bladder irrigation during  hospitalization with resolution of blood within urine.  Patient was transfused as above and has remained hemodynamically stable with stable hemoglobin at time of discharge.  Patient will continue with Foley cath in place.  Continue home tamsulosin.  Started on finasteride.  Outpatient follow-up with urology, Dr. Tresa Moore two weeks.  Repeat BMP/CBC 1 week.  Acute renal failure on CKD stage IIIa Creatinine 3.93 on admission, likely secondary to ATN from hypotension/hemorrhagic shock versus obstructive uropathy.  Foley catheter was replaced with CBI as above.  Creatinine peaked at 4.13 during hospitalization.  Creatinine down to 1.85 at time of discharge.  Recommend repeat BMP 1 week.  Holding home ARB and Lasix on discharge.  History of essential hypertension On amlodipine, furosemide, ARB outpatient.  Held during hospitalization due to combination septic/hemorrhagic shock as above.  Blood pressure remains well controlled off of antihypertensives.  We will continue to hold amlodipine, furosemide and ARB at time of discharge.  Follow-up with PCP regarding reinitiation of antihypertensive regimen if needed.  Hx renal cell carcinoma Renal cell carcinoma diagnosed 2017, followed by [facility].  S/p nephrectomy.  CT renal stone study 5/16 with suspicious lesion lower pole right kidney, indeterminant for developing solid renal mass.  Continue outpatient follow-up with urology and hematology/oncology.  Discharge Diagnoses:  Active Problems:   AKI (acute kidney injury) [facility]    Discharge Instructions  Discharge Instructions    Call MD for:  difficulty breathing, headache or visual disturbances   Complete by: As directed    Call MD for:  extreme fatigue   Complete by: As directed    Call MD for:  persistant dizziness or light-headedness   Complete by: As directed    Call MD for:  persistant nausea and vomiting   Complete by: As directed    Call MD for:  severe uncontrolled pain    Complete by: As directed    Call MD for:  temperature >100.4   Complete by: As directed    Continue foley catheter   Complete by: As directed    Diet - low sodium heart healthy   Complete by: As directed    Increase activity slowly   Complete by: As directed    Urinary leg bag   Complete by: As directed      Allergies as of 05/12/2021      Reactions   Penicillins Hives   Has patient had a PCN reaction causing immediate rash, facial/tongue/throat swelling, SOB or lightheadedness with hypotension:Yes Has patient had a PCN reaction causing severe rash involving mucus membranes or skin necrosis: Yes Has patient had a PCN reaction that required hospitalization No Has patient had a PCN reaction occurring within the last 10 years: No If all of the above answers are "NO", then may proceed with Cephalosporin use.   Latex Rash      Medication List    STOP taking these medications   amLODipine 10 MG tablet Commonly known as: NORVASC   furosemide 20 MG tablet Commonly known as: Lasix   irbesartan 300 MG tablet Commonly known as: AVAPRO   telmisartan 80 MG tablet Commonly known as: MICARDIS     TAKE these medications   acetaminophen 500 MG tablet Commonly known as: TYLENOL Take 500 mg by mouth every 4 (four) hours as needed for pain.   aspirin EC 81 MG tablet Take 81 mg by mouth daily.   ferrous sulfate 325 (65 FE) MG tablet Take 1 tablet (  325 mg total) by mouth 2 (two) times daily with a meal.   finasteride 5 MG tablet Commonly known as: PROSCAR Take 1 tablet (5 mg total) by mouth daily. Start taking on: May 13, 2021   fluticasone 50 MCG/ACT nasal spray Commonly known as: FLONASE Place 2 sprays into both nostrils daily as needed for allergies.   lactulose 10 GM/15ML solution Commonly known as: CHRONULAC Take 30 mLs by mouth daily as needed for constipation.   multivitamin with minerals tablet Take 1 tablet by mouth daily.   oxybutynin 5 MG tablet Commonly known  as: DITROPAN Take 1 tablet (5 mg total) by mouth every 8 (eight) hours as needed for bladder spasms.   tamsulosin 0.4 MG Caps capsule Commonly known as: FLOMAX Take 1 capsule (0.4 mg total) by mouth daily after supper.       Follow-up Information    Alexis Frock, MD In 2 weeks.   Specialty: Urology Why: My office will call to schedule follow-up appointment.  Contact information: [place] [address] [phone]        Prince Solian, MD. Schedule an appointment as soon as possible for a visit in 1 week(s).   Specialty: Internal Medicine Contact information: [place] [address] [phone]              Allergies  Allergen Reactions  . Penicillins Hives    Has patient had a PCN reaction causing immediate rash, facial/tongue/throat swelling, SOB or lightheadedness with hypotension:Yes Has patient had a PCN reaction causing severe rash involving mucus membranes or skin necrosis: Yes Has patient had a PCN reaction that required hospitalization No Has patient had a PCN reaction occurring within the last 10 years: No If all of the above answers are "NO", then may proceed with Cephalosporin use.   . Latex Rash    Consultations:  PCCM  Urology   Procedures/Studies: US RENAL  Result Date: 05/04/2021 CLINICAL DATA:  Gross hematuria. Prior right nephrectomy. Chronic kidney disease. EXAM: RENAL / URINARY TRACT ULTRASOUND COMPLETE COMPARISON:  08/14/2015.  CT 05/03/2021 FINDINGS: Right Kidney: Renal measurements: Prior right nephrectomy Left Kidney: Renal measurements: 12.5 x 6.4 x 6.3 cm = volume: 262 mL. Normal echotexture. Mild fullness of the renal pelvis. Left upper pole cyst measures up to 5 cm. Solid-appearing lesion noted off the lower pole of the left kidney measures up to 3 cm. Bladder: Foley catheter in place. Hypoechoic filling defects within the bladder could reflect mass or hematoma. Other: Markedly enlarged prostate.  IMPRESSION: Fullness of the left renal collecting system. Exophytic solid-appearing mass off the lower pole of the left kidney measures 3 cm. This could be further characterized with MRI. Markedly enlarged prostate. Hypoechoic area within the bladder could reflect mass or hematoma. Electronically Signed   By: Rolm Baptise M.D.   On: 05/04/2021 11:09   DG Chest Port 1 View  Result Date: 05/03/2021 CLINICAL DATA:  Sepsis EXAM: PORTABLE CHEST 1 VIEW COMPARISON:  02/10/2016 FINDINGS: Rounded opacity within the right mid lung zone peripherally represents callus related to a remote right fifth rib fracture. The lungs are otherwise clear. No pneumothorax or pleural effusion. Cardiac size within normal limits. Pulmonary vascularity is normal. No acute bone abnormality. IMPRESSION: No active disease. Electronically Signed   By: Fidela Salisbury MD   On: 05/03/2021 06:51   CT Renal Stone Study  Result Date: 05/03/2021 CLINICAL DATA:  Urinary retention, urinary tract infection, hematuria EXAM: CT ABDOMEN AND PELVIS WITHOUT CONTRAST  TECHNIQUE: Multidetector CT imaging of the abdomen and pelvis was performed following the standard protocol without IV contrast. COMPARISON:  02/10/2016, MRI 08/06/2013 FINDINGS: Lower chest: Mild bibasilar dependent atelectasis. The visualized heart and pericardium are unremarkable. Hepatobiliary: Cholelithiasis without pericholecystic inflammatory change noted. Stable cyst within the right hepatic dome. No intra or extrahepatic biliary ductal dilation. Pancreas: Unremarkable Spleen: Unremarkable Adrenals/Urinary Tract: Surgical changes of right radical nephrectomy are identified. Left adrenal gland is unremarkable. There is mild left hydronephrosis, new since prior examination to the level of the left ureterovesicular junction likely related to extrinsic compression by the massively enlarged prostate gland. 3 mm nonobstructing calculus is noted within the upper pole. Infiltration within the  posterior pararenal space likely relates to prior nephrostomy placement. 5.7 cm left renal interpolar exophytic simple cortical cyst noted. Additional exophytic simple cortical cyst noted arising from the lower pole of the left kidney. There is a exophytic lesion of heterogeneous attenuation involving the lower pole the right kidney, however, new since prior examination and indeterminate though suspicious for a a developing solid renal mass. Foley catheter balloon is seen within a decompressed bladder lumen. There is circumferential thickening of the bladder wall which may relate to inflammation or chronic obstruction. Stomach/Bowel: A right lower quadrant incisional hernia has developed containing the ileocecal junction. A small amount of a free fluid is seen within the hernia sac, however, the involved loops of bowel appear unremarkable and there is no evidence of obstruction. The stomach, small bowel, and large bowel are otherwise unremarkable. The appendix is not well visualized and may be absent. No free intraperitoneal gas or fluid. Vascular/Lymphatic: The abdominal vasculature is unremarkable. No pathologic adenopathy within the abdomen and pelvis. Reproductive: There is massive enlargement of the prostate gland. Other: Small fat containing left inguinal hernia is present. The rectum is unremarkable. Musculoskeletal: Numerous osseous lytic lesions are again identified and have progressed in the interval since the prior examination particularly within the a pelvis and visualized proximal femurs bilaterally. Pathologic fracture of L4 is again identified. No acute bone abnormality. IMPRESSION: Massive enlargement of the prostate gland. This likely results in mass effect upon the left ureterovesicular junction contributing to mild left hydronephrosis which appears new since prior examination. Foley decompression of the bladder lumen is noted. Indeterminate exophytic possibly solid lesion arising from the lower  pole of the left kidney, new since prior examination. Given the depth of the lesion, sonography could be utilized, as an initial step, for further evaluation. Interval development of right lower quadrant incisional hernia containing the ileocecal junction. No evidence of obstruction. Interval progression of disease involving numerous osseous lytic metastases. This appears most evident within the pelvis and visualized proximal femurs bilaterally. Stable pathologic fracture of L4. Electronically Signed   By: Fidela Salisbury MD   On: 05/03/2021 06:13     Subjective: Patient seen and examined at bedside, resting comfortably.  Spouse present.  Foley catheter draining clear yellow urine.  Hemoglobin stable.  Okay for discharge home with outpatient follow-up per urology.  No other questions or concerns at this time.  Denies headache, no fever/chills/night sweats, no nausea/vomiting/diarrhea, no chest pain, no palpitations, no shortness of breath, no abdominal pain, no weakness, no fatigue, no paresthesias.  No acute events overnight per nursing staff.  Discharge Exam: Vitals:   05/11/21 1935 05/12/21 0426  BP: 125/67 127/77  Pulse: 91 77  Resp: 17 18  Temp: 99.3 F (37.4 C) 99 F (37.2 C)  SpO2: 100% 100%   Vitals:  05/11/21 1644 05/11/21 1932 05/11/21 1935 05/12/21 0426  BP: 109/81 119/71 125/67 127/77  Pulse: 65 83 91 77  Resp: 19 18 17 18   Temp: 99.6 F (37.6 C) 99.8 F (37.7 C) 99.3 F (37.4 C) 99 F (37.2 C)  TempSrc: Oral Oral Oral Oral  SpO2: 100% 100% 100% 100%  Weight:    83.6 kg  Height:        General: Pt is alert, awake, not in acute distress Cardiovascular: RRR, S1/S2 +, no rubs, no gallops Respiratory: CTA bilaterally, no wheezing, no rhonchi Abdominal: Soft, NT, ND, bowel sounds +, Foley catheter noted, clear yellow urine in Foley bag Extremities: no edema, no cyanosis    The results of significant diagnostics from this hospitalization (including imaging,  microbiology, ancillary and laboratory) are listed below for reference.     Microbiology: Recent Results (from the past 240 hour(s))  Resp Panel by RT-PCR (Flu A&B, Covid) Nasopharyngeal Swab     Status: None   Collection Time: 05/03/21 12:24 AM   Specimen: Nasopharyngeal Swab; Nasopharyngeal(NP) swabs in vial transport medium  Result Value Ref Range Status   SARS Coronavirus 2 by RT PCR NEGATIVE NEGATIVE Final    Comment: (NOTE) SARS-CoV-2 target nucleic acids are NOT DETECTED.  The SARS-CoV-2 RNA is generally detectable in upper respiratory specimens during the acute phase of infection. The lowest concentration of SARS-CoV-2 viral copies this assay can detect is 138 copies/mL. A negative result does not preclude SARS-Cov-2 infection and should not be used as the sole basis for treatment or other patient management decisions. A negative result may occur with  improper specimen collection/handling, submission of specimen other than nasopharyngeal swab, presence of viral mutation(s) within the areas targeted by this assay, and inadequate number of viral copies(<138 copies/mL). A negative result must be combined with clinical observations, patient history, and epidemiological information. The expected result is Negative.  Fact Sheet for Patients:  EntrepreneurPulse.com.au  Fact Sheet for Healthcare Providers:  IncredibleEmployment.be  This test is no t yet approved or cleared by the [place] FDA and  has been authorized for detection and/or diagnosis of SARS-CoV-2 by FDA under an Emergency Use Authorization (EUA). This EUA will remain  in effect (meaning this test can be used) for the duration of the COVID-19 declaration under Section 564(b)(1) of the Act, 21 U.S.C.section 360bbb-3(b)(1), unless the authorization is terminated  or revoked sooner.       Influenza A by PCR NEGATIVE NEGATIVE Final   Influenza B by PCR NEGATIVE NEGATIVE  Final    Comment: (NOTE) The Xpert Xpress SARS-CoV-2/FLU/RSV plus assay is intended as an aid in the diagnosis of influenza from Nasopharyngeal swab specimens and should not be used as a sole basis for treatment. Nasal washings and aspirates are unacceptable for Xpert Xpress SARS-CoV-2/FLU/RSV testing.  Fact Sheet for Patients: EntrepreneurPulse.com.au  Fact Sheet for Healthcare Providers: IncredibleEmployment.be  This test is not yet approved or cleared by the [place] FDA and has been authorized for detection and/or diagnosis of SARS-CoV-2 by FDA under an Emergency Use Authorization (EUA). This EUA will remain in effect (meaning this test can be used) for the duration of the COVID-19 declaration under Section 564(b)(1) of the Act, 21 U.S.C. section 360bbb-3(b)(1), unless the authorization is terminated or revoked.  Performed at [facility], [place] [address], [place], [place] [address]   Blood Culture (routine x 2)     Status: None   Collection Time: 05/03/21  2:10 AM   Specimen:  BLOOD RIGHT FOREARM  Result Value Ref Range Status   Specimen Description BLOOD RIGHT FOREARM  Final   Special Requests   Final    BOTTLES DRAWN AEROBIC AND ANAEROBIC Blood Culture adequate volume   Culture   Final    NO GROWTH 6 DAYS Performed at [facility], [address] [place], [place] [address]    Report Status 05/09/2021 FINAL  Final  MRSA PCR Screening     Status: None   Collection Time: 05/03/21  4:30 AM   Specimen: Nasopharyngeal  Result Value Ref Range Status   MRSA by PCR NEGATIVE NEGATIVE Final    Comment:        The GeneXpert MRSA Assay (FDA approved for NASAL specimens only), is one component of a comprehensive MRSA colonization surveillance program. It is not intended to diagnose MRSA infection nor to guide or monitor treatment for MRSA infections. Performed at [facility], [place] [address], [place], [place]  [address]   Blood Culture (routine x 2)     Status: None   Collection Time: 05/03/21 10:36 AM   Specimen: BLOOD  Result Value Ref Range Status   Specimen Description BLOOD LEFT ANTECUBITAL  Final   Special Requests   Final    BOTTLES DRAWN AEROBIC AND ANAEROBIC Blood Culture results may not be optimal due to an inadequate volume of blood received in culture bottles   Culture   Final    NO GROWTH 6 DAYS Performed at [facility], [place] [address], [place], [place] [address]    Report Status 05/09/2021 FINAL  Final     Labs: BNP (last 3 results) No results for input(s): BNP in the last 8760 hours. Basic Metabolic Panel: Recent Labs  Lab 05/08/21 0206 05/09/21 0403 05/10/21 0742 05/11/21 0312 05/12/21 0114  NA 135 136 138 138 138  K 3.3* 3.5 3.8 3.8 3.8  CL 99 103 106 108 107  CO2 27 27 24 23 22   GLUCOSE 87 111* 84 89 101*  BUN 22 17 16 15 17   CREATININE 2.52* 2.23* 2.03* 1.88* 1.85*  CALCIUM 6.9* 7.3* 7.7* 7.6* 7.5*   Liver Function Tests: Recent Labs  Lab 05/07/21 0046  ALBUMIN 1.6*   No results for input(s): LIPASE, AMYLASE in the last 168 hours. No results for input(s): AMMONIA in the last 168 hours. CBC: Recent Labs  Lab 05/08/21 0206 05/09/21 0403 05/10/21 0742 05/11/21 0312 05/12/21 0114  WBC 18.4* 16.9* 15.6* 12.2* 9.9  HGB 8.1* 8.0* 7.6* 7.5* 7.4*  HCT 24.1* 24.0* 23.0* 22.9* 22.8*  MCV 87.0 88.6 89.1 89.5 90.8  PLT 245 308 390 393 447*   Cardiac Enzymes: No results for input(s): CKTOTAL, CKMB, CKMBINDEX, TROPONINI in the last 168 hours. BNP: Invalid input(s): POCBNP CBG: Recent Labs  Lab 05/11/21 1116 05/11/21 1647 05/11/21 1931 05/11/21 2033 05/12/21 0824  GLUCAP 98 109* 156* 121* 84   D-Dimer No results for input(s): DDIMER in the last 72 hours. Hgb A1c No results for input(s): HGBA1C in the last 72 hours. Lipid Profile No results for input(s): CHOL, HDL, LDLCALC, TRIG, CHOLHDL, LDLDIRECT in the last 72 hours. Thyroid function  studies No results for input(s): TSH, T4TOTAL, T3FREE, THYROIDAB in the last 72 hours.  Invalid input(s): FREET3 Anemia work up Recent Labs    05/11/21 1023  FERRITIN 272  TIBC 164*  IRON 15*   Urinalysis    Component Value Date/Time   COLORURINE RED (A) 04/17/2021 1447   APPEARANCEUR  TURBID (A) 04/17/2021 1447   LABSPEC  04/17/2021 1447    TEST NOT REPORTED DUE TO COLOR INTERFERENCE OF URINE PIGMENT   PHURINE  04/17/2021 1447    TEST NOT REPORTED DUE TO COLOR INTERFERENCE OF URINE PIGMENT   GLUCOSEU (A) 04/17/2021 1447    TEST NOT REPORTED DUE TO COLOR INTERFERENCE OF URINE PIGMENT   HGBUR (A) 04/17/2021 1447    TEST NOT REPORTED DUE TO COLOR INTERFERENCE OF URINE PIGMENT   BILIRUBINUR (A) 04/17/2021 1447    TEST NOT REPORTED DUE TO COLOR INTERFERENCE OF URINE PIGMENT   KETONESUR (A) 04/17/2021 1447    TEST NOT REPORTED DUE TO COLOR INTERFERENCE OF URINE PIGMENT   PROTEINUR (A) 04/17/2021 1447    TEST NOT REPORTED DUE TO COLOR INTERFERENCE OF URINE PIGMENT   UROBILINOGEN >=8.0 04/16/2021 0932   NITRITE (A) 04/17/2021 1447    TEST NOT REPORTED DUE TO COLOR INTERFERENCE OF URINE PIGMENT   LEUKOCYTESUR (A) 04/17/2021 1447    TEST NOT REPORTED DUE TO COLOR INTERFERENCE OF URINE PIGMENT   Sepsis Labs Invalid input(s): PROCALCITONIN,  WBC,  LACTICIDVEN Microbiology Recent Results (from the past 240 hour(s))  Resp Panel by RT-PCR (Flu A&B, Covid) Nasopharyngeal Swab     Status: None   Collection Time: 05/03/21 12:24 AM   Specimen: Nasopharyngeal Swab; Nasopharyngeal(NP) swabs in vial transport medium  Result Value Ref Range Status   SARS Coronavirus 2 by RT PCR NEGATIVE NEGATIVE Final    Comment: (NOTE) SARS-CoV-2 target nucleic acids are NOT DETECTED.  The SARS-CoV-2 RNA is generally detectable in upper respiratory specimens during the acute phase of infection. The lowest concentration of SARS-CoV-2 viral copies this assay can detect is 138 copies/mL. A negative result  does not preclude SARS-Cov-2 infection and should not be used as the sole basis for treatment or other patient management decisions. A negative result may occur with  improper specimen collection/handling, submission of specimen other than nasopharyngeal swab, presence of viral mutation(s) within the areas targeted by this assay, and inadequate number of viral copies(<138 copies/mL). A negative result must be combined with clinical observations, patient history, and epidemiological information. The expected result is Negative.  Fact Sheet for Patients:  EntrepreneurPulse.com.au  Fact Sheet for Healthcare Providers:  IncredibleEmployment.be  This test is no t yet approved or cleared by the [place] FDA and  has been authorized for detection and/or diagnosis of SARS-CoV-2 by FDA under an Emergency Use Authorization (EUA). This EUA will remain  in effect (meaning this test can be used) for the duration of the COVID-19 declaration under Section 564(b)(1) of the Act, 21 U.S.C.section 360bbb-3(b)(1), unless the authorization is terminated  or revoked sooner.       Influenza A by PCR NEGATIVE NEGATIVE Final   Influenza B by PCR NEGATIVE NEGATIVE Final    Comment: (NOTE) The Xpert Xpress SARS-CoV-2/FLU/RSV plus assay is intended as an aid in the diagnosis of influenza from Nasopharyngeal swab specimens and should not be used as a sole basis for treatment. Nasal washings and aspirates are unacceptable for Xpert Xpress SARS-CoV-2/FLU/RSV testing.  Fact Sheet for Patients: EntrepreneurPulse.com.au  Fact Sheet for Healthcare Providers: IncredibleEmployment.be  This test is not yet approved or cleared by the [place] FDA and has been authorized for detection and/or diagnosis of SARS-CoV-2 by FDA under an Emergency Use Authorization (EUA). This EUA will remain in effect (meaning this test can be used) for  the duration of the COVID-19 declaration under Section 564(b)(1) of the Act,  21 U.S.C. section 360bbb-3(b)(1), unless the authorization is terminated or revoked.  Performed at [facility], [place] [address], [place], [place] [address]   Blood Culture (routine x 2)     Status: None   Collection Time: 05/03/21  2:10 AM   Specimen: BLOOD RIGHT FOREARM  Result Value Ref Range Status   Specimen Description BLOOD RIGHT FOREARM  Final   Special Requests   Final    BOTTLES DRAWN AEROBIC AND ANAEROBIC Blood Culture adequate volume   Culture   Final    NO GROWTH 6 DAYS Performed at [facility], [place] [address] [place], [place] [address]    Report Status 05/09/2021 FINAL  Final  MRSA PCR Screening     Status: None   Collection Time: 05/03/21  4:30 AM   Specimen: Nasopharyngeal  Result Value Ref Range Status   MRSA by PCR NEGATIVE NEGATIVE Final    Comment:        The GeneXpert MRSA Assay (FDA approved for NASAL specimens only), is one component of a comprehensive MRSA colonization surveillance program. It is not intended to diagnose MRSA infection nor to guide or monitor treatment for MRSA infections. Performed at [facility], [place] [address], [place], [place] [address]   Blood Culture (routine x 2)     Status: None   Collection Time: 05/03/21 10:36 AM   Specimen: BLOOD  Result Value Ref Range Status   Specimen Description BLOOD LEFT ANTECUBITAL  Final   Special Requests   Final    BOTTLES DRAWN AEROBIC AND ANAEROBIC Blood Culture results may not be optimal due to an inadequate volume of blood received in culture bottles   Culture   Final    NO GROWTH 6 DAYS Performed at [facility], [place] [address], [place], [place] [address]    Report Status 05/09/2021 FINAL  Final     Time coordinating discharge: Over 30 minutes  SIGNED:   Tajee Savant J [place] DO  [facility] 05/12/2021, 11:57 AM

## 2021-05-20 DIAGNOSIS — C642 Malignant neoplasm of left kidney, except renal pelvis: Secondary | ICD-10-CM | POA: Diagnosis not present

## 2021-05-20 DIAGNOSIS — R310 Gross hematuria: Secondary | ICD-10-CM | POA: Diagnosis not present

## 2021-05-20 DIAGNOSIS — C641 Malignant neoplasm of right kidney, except renal pelvis: Secondary | ICD-10-CM | POA: Diagnosis not present

## 2021-05-20 DIAGNOSIS — N138 Other obstructive and reflux uropathy: Secondary | ICD-10-CM | POA: Diagnosis not present

## 2021-05-20 DIAGNOSIS — R972 Elevated prostate specific antigen [PSA]: Secondary | ICD-10-CM | POA: Diagnosis not present

## 2021-06-01 ENCOUNTER — Other Ambulatory Visit: Payer: Self-pay | Admitting: Urology

## 2021-06-24 ENCOUNTER — Other Ambulatory Visit (HOSPITAL_COMMUNITY): Payer: Self-pay

## 2021-06-29 DIAGNOSIS — R310 Gross hematuria: Secondary | ICD-10-CM | POA: Diagnosis not present

## 2021-06-29 DIAGNOSIS — N138 Other obstructive and reflux uropathy: Secondary | ICD-10-CM | POA: Diagnosis not present

## 2021-06-29 DIAGNOSIS — C642 Malignant neoplasm of left kidney, except renal pelvis: Secondary | ICD-10-CM | POA: Diagnosis not present

## 2021-06-29 DIAGNOSIS — R8271 Bacteriuria: Secondary | ICD-10-CM | POA: Diagnosis not present

## 2021-06-30 NOTE — Patient Instructions (Addendum)
DUE TO COVID-19 ONLY ONE VISITOR IS ALLOWED TO COME WITH YOU AND STAY IN THE WAITING ROOM ONLY DURING PRE OP AND PROCEDURE DAY OF SURGERY. THE 2 VISITORS  MAY VISIT WITH YOU AFTER SURGERY IN YOUR PRIVATE ROOM DURING VISITING HOURS ONLY!  YOU NEED TO HAVE A COVID 19 TEST ON__7/18_____ @_8 :15_____, THIS TEST MUST BE DONE BEFORE SURGERY,  COVID TESTING SITE [place] [address], IT IS ON THE RIGHT GOING OUT [address] APPROXIMATELY  2 MINUTES PAST [employer] ON THE RIGHT. ONCE YOUR COVID TEST IS COMPLETED,  PLEASE BEGIN THE QUARANTINE INSTRUCTIONS AS OUTLINED IN YOUR HANDOUT.                [place]     Your procedure is scheduled on: 07/07/21   Report to [facility] Main  Entrance   Report to admitting at   6:30 AM     Call this number if you have problems the morning of surgery [phone]               Follow bowel prep instructions from the Dr's office.             Remember: Do not eat food after Midnight.  You may have clear liquids until 5:30 AM   BRUSH YOUR TEETH MORNING OF SURGERY AND RINSE YOUR MOUTH OUT, NO CHEWING GUM CANDY OR MINTS.     Take these medicines the morning of surgery with A SIP OF WATER: Use Flonase if needed, Finasteride and Tamsulosin if it is on the day you normally take it.                                 You may not have any metal on your body including hair pins and              piercings  Do not wear jewelry, make-up, lotions, powders or perfumes, deodorant                       Men may shave face and neck.   Do not bring valuables to the hospital. [place].  Contacts, dentures or bridgework may not be worn into surgery.[facility] - Preparing for Surgery Before surgery, you can play an important role.  Because skin is not sterile, your skin needs to be as free of germs as possible.  You can reduce the number of germs on your  skin by washing with CHG (chlorahexidine gluconate) soap before surgery.  CHG is an antiseptic cleaner which kills germs and bonds with the skin to continue killing germs even after washing. Please DO NOT use if you have an allergy to CHG or antibacterial soaps.  If your skin becomes reddened/irritated stop using the CHG and inform your nurse when you arrive at Short Stay. You may shave your face/neck.  Please follow these instructions carefully:  1.  Shower with CHG Soap the night before surgery and the  morning of Surgery.  2.  If you choose to wash your hair, wash your hair first as usual with your  normal  shampoo.  3.  After you shampoo, rinse your hair and body thoroughly  to remove the  shampoo.                                        4.  Use CHG as you would any other liquid soap.  You can apply chg directly  to the skin and wash                       Gently with a scrungie or clean washcloth.  5.  Apply the CHG Soap to your body ONLY FROM THE NECK DOWN.   Do not use on face/ open                           Wound or open sores. Avoid contact with eyes, ears mouth and genitals (private parts).                       Wash face,  Genitals (private parts) with your normal soap.             6.  Wash thoroughly, paying special attention to the area where your surgery  will be performed.  7.  Thoroughly rinse your body with warm water from the neck down.  8.  DO NOT shower/wash with your normal soap after using and rinsing off  the CHG Soap.             9.  Pat yourself dry with a clean towel.            10.  Wear clean pajamas.            11.  Place clean sheets on your bed the night of your first shower and do not  sleep with pets. Day of Surgery : Do not apply any lotions/deodorants the morning of surgery.  Please wear clean clothes to the hospital/surgery center.  FAILURE TO FOLLOW THESE INSTRUCTIONS MAY RESULT IN THE CANCELLATION OF YOUR SURGERY PATIENT  SIGNATURE_________________________________  NURSE SIGNATURE__________________________________  ________________________________________________________________________

## 2021-07-01 ENCOUNTER — Other Ambulatory Visit: Payer: Self-pay

## 2021-07-01 ENCOUNTER — Encounter (HOSPITAL_COMMUNITY)
Admission: RE | Admit: 2021-07-01 | Discharge: 2021-07-01 | Disposition: A | Discharge status: Home / Self Care | Payer: Medicare HMO | Source: Ambulatory Visit | Attending: Urology | Admitting: Urology

## 2021-07-01 ENCOUNTER — Encounter (HOSPITAL_COMMUNITY): Payer: Self-pay

## 2021-07-01 DIAGNOSIS — Z20822 Contact with and (suspected) exposure to covid-19: Secondary | ICD-10-CM | POA: Insufficient documentation

## 2021-07-01 DIAGNOSIS — Z01812 Encounter for preprocedural laboratory examination: Secondary | ICD-10-CM | POA: Diagnosis not present

## 2021-07-01 LAB — BASIC METABOLIC PANEL
Anion gap: 4 — ABNORMAL LOW (ref 5–15)
BUN: 19 mg/dL (ref 8–23)
CO2: 26 mmol/L (ref 22–32)
Calcium: 8.7 mg/dL — ABNORMAL LOW (ref 8.9–10.3)
Chloride: 111 mmol/L (ref 98–111)
Creatinine, Ser: 1.57 mg/dL — ABNORMAL HIGH (ref 0.61–1.24)
GFR, Estimated: 46 mL/min — ABNORMAL LOW (ref >60)
Glucose, Bld: 95 mg/dL (ref 70–99)
Potassium: 4.4 mmol/L (ref 3.5–5.1)
Sodium: 141 mmol/L (ref 135–145)

## 2021-07-01 LAB — CBC
HCT: 35.9 % — ABNORMAL LOW (ref 39.0–52.0)
Hemoglobin: 10.7 g/dL — ABNORMAL LOW (ref 13.0–17.0)
MCH: 27.5 pg (ref 26.0–34.0)
MCHC: 29.8 g/dL — ABNORMAL LOW (ref 30.0–36.0)
MCV: 92.3 fL (ref 80.0–100.0)
Platelets: 262 10*3/uL (ref 150–400)
RBC: 3.89 MIL/uL — ABNORMAL LOW (ref 4.22–5.81)
RDW: 15.9 % — ABNORMAL HIGH (ref 11.5–15.5)
WBC: 5.8 10*3/uL (ref 4.0–10.5)
nRBC: 0 % (ref 0.0–0.2)

## 2021-07-01 NOTE — Progress Notes (Signed)
COVID Vaccine Completed:Yes Date COVID Vaccine completed:Pt reports 2 shots taken but doesn't know the dates COVID vaccine manufacturer: Pfizer     PCP - Dr. Alfonso Patten. Marcus Hatfield 06/30/21 Cardiologist - no Oncologist Dr. Aris Lot LOV 03/2021 for Pt's multiple myeloma  Chest x-ray - 05/03/21 pt has a port EKG - 05/04/21-epic Stress Test - no ECHO - no Cardiac Cath - no Pacemaker/ICD device last checked:NA  Sleep Study -no  CPAP -   Fasting Blood Sugar - NA Checks Blood Sugar _____ times a day  Blood Thinner Instructions:NA Aspirin Instructions: Last Dose:  Anesthesia review: yes  Patient denies shortness of breath, fever, cough and chest pain at PAT appointment Pt reports no SOB with any activities. He was anemic and had 2 units of blood 05/03/21 wile in the hospital for septic shock. Pt had his Rt kidney removed.  Patient verbalized understanding of instructions that were given to them at the PAT appointment. Patient was also instructed that they will need to review over the PAT instructions again at home before surgery. yes

## 2021-07-05 ENCOUNTER — Other Ambulatory Visit (HOSPITAL_COMMUNITY)
Admission: RE | Admit: 2021-07-05 | Discharge: 2021-07-05 | Disposition: A | Discharge status: Home / Self Care | Payer: Medicare HMO | Source: Ambulatory Visit | Attending: Urology | Admitting: Urology

## 2021-07-05 DIAGNOSIS — Z01812 Encounter for preprocedural laboratory examination: Secondary | ICD-10-CM | POA: Insufficient documentation

## 2021-07-05 DIAGNOSIS — Z20822 Contact with and (suspected) exposure to covid-19: Secondary | ICD-10-CM | POA: Insufficient documentation

## 2021-07-05 LAB — SARS CORONAVIRUS 2 (TAT 6-24 HRS): SARS Coronavirus 2: NEGATIVE

## 2021-07-06 NOTE — Anesthesia Preprocedure Evaluation (Addendum)
Anesthesia Evaluation  Patient identified by MRN, date of birth, ID band Patient awake    Reviewed: Allergy & Precautions, NPO status , Patient's Chart, lab work & pertinent test results  History of Anesthesia Complications Negative for: history of anesthetic complications  Airway Mallampati: II  TM Distance: >3 FB Neck ROM: Full    Dental  (+) Dental Advisory Given, Caps, Loose   Pulmonary neg pulmonary ROS,  07/05/2021 SARS coronavirus NEG   breath sounds clear to auscultation       Cardiovascular hypertension, Pt. on medications (-) angina Rhythm:Regular Rate:Normal     Neuro/Psych negative neurological ROS     GI/Hepatic negative GI ROS, Neg liver ROS,   Endo/Other  negative endocrine ROS  Renal/GU Renal InsufficiencyRenal disease   Prostate cancer    Musculoskeletal  (+) Arthritis , Osteoarthritis,    Abdominal   Peds  Hematology  (+) Blood dyscrasia (Hb 10.7), anemia , Multiple myeloma   Anesthesia Other Findings   Reproductive/Obstetrics                            Anesthesia Physical Anesthesia Plan  ASA: 3  Anesthesia Plan: General   Post-op Pain Management:    Induction: Intravenous  PONV Risk Score and Plan: 2 and Ondansetron, Dexamethasone and Treatment may vary due to age or medical condition  Airway Management Planned: Oral ETT  Additional Equipment: Arterial line  Intra-op Plan:   Post-operative Plan: Extubation in OR  Informed Consent: I have reviewed the patients History and Physical, chart, labs and discussed the procedure including the risks, benefits and alternatives for the proposed anesthesia with the patient or authorized representative who has indicated his/her understanding and acceptance.     Dental advisory given  Plan Discussed with: CRNA and Surgeon  Anesthesia Plan Comments: (A-line for sampling)      Anesthesia Quick Evaluation

## 2021-07-07 ENCOUNTER — Inpatient Hospital Stay (HOSPITAL_COMMUNITY): Payer: Medicare HMO | Admitting: Physician Assistant

## 2021-07-07 ENCOUNTER — Inpatient Hospital Stay (HOSPITAL_COMMUNITY)
Admission: AD | Admit: 2021-07-07 | Discharge: 2021-07-11 | DRG: 657 | Disposition: A | Discharge status: Home / Self Care | Payer: Medicare HMO | Attending: Urology | Admitting: Urology

## 2021-07-07 ENCOUNTER — Inpatient Hospital Stay (HOSPITAL_COMMUNITY): Payer: Medicare HMO | Admitting: Anesthesiology

## 2021-07-07 ENCOUNTER — Other Ambulatory Visit (HOSPITAL_COMMUNITY): Payer: Self-pay

## 2021-07-07 ENCOUNTER — Encounter (HOSPITAL_COMMUNITY): Payer: Self-pay | Admitting: Urology

## 2021-07-07 ENCOUNTER — Encounter (HOSPITAL_COMMUNITY)
Admission: AD | Disposition: A | Discharge status: Home / Self Care | Payer: Self-pay | Source: Home / Self Care | Attending: Urology

## 2021-07-07 ENCOUNTER — Other Ambulatory Visit: Payer: Self-pay

## 2021-07-07 DIAGNOSIS — N1831 Chronic kidney disease, stage 3a: Secondary | ICD-10-CM | POA: Diagnosis not present

## 2021-07-07 DIAGNOSIS — I129 Hypertensive chronic kidney disease with stage 1 through stage 4 chronic kidney disease, or unspecified chronic kidney disease: Secondary | ICD-10-CM | POA: Diagnosis present

## 2021-07-07 DIAGNOSIS — Z833 Family history of diabetes mellitus: Secondary | ICD-10-CM

## 2021-07-07 DIAGNOSIS — Z91040 Latex allergy status: Secondary | ICD-10-CM

## 2021-07-07 DIAGNOSIS — Z880 Allergy status to penicillin: Secondary | ICD-10-CM

## 2021-07-07 DIAGNOSIS — N401 Enlarged prostate with lower urinary tract symptoms: Secondary | ICD-10-CM | POA: Diagnosis not present

## 2021-07-07 DIAGNOSIS — Z888 Allergy status to other drugs, medicaments and biological substances status: Secondary | ICD-10-CM

## 2021-07-07 DIAGNOSIS — N2889 Other specified disorders of kidney and ureter: Secondary | ICD-10-CM | POA: Diagnosis present

## 2021-07-07 DIAGNOSIS — Z905 Acquired absence of kidney: Secondary | ICD-10-CM

## 2021-07-07 DIAGNOSIS — N281 Cyst of kidney, acquired: Secondary | ICD-10-CM | POA: Diagnosis not present

## 2021-07-07 DIAGNOSIS — C642 Malignant neoplasm of left kidney, except renal pelvis: Secondary | ICD-10-CM | POA: Diagnosis not present

## 2021-07-07 DIAGNOSIS — Z8249 Family history of ischemic heart disease and other diseases of the circulatory system: Secondary | ICD-10-CM

## 2021-07-07 DIAGNOSIS — C9000 Multiple myeloma not having achieved remission: Secondary | ICD-10-CM | POA: Diagnosis present

## 2021-07-07 DIAGNOSIS — K439 Ventral hernia without obstruction or gangrene: Secondary | ICD-10-CM | POA: Diagnosis present

## 2021-07-07 DIAGNOSIS — R338 Other retention of urine: Secondary | ICD-10-CM | POA: Diagnosis not present

## 2021-07-07 DIAGNOSIS — Z20822 Contact with and (suspected) exposure to covid-19: Secondary | ICD-10-CM | POA: Diagnosis present

## 2021-07-07 DIAGNOSIS — D62 Acute posthemorrhagic anemia: Secondary | ICD-10-CM | POA: Diagnosis not present

## 2021-07-07 DIAGNOSIS — N039 Chronic nephritic syndrome with unspecified morphologic changes: Secondary | ICD-10-CM | POA: Diagnosis not present

## 2021-07-07 HISTORY — PX: ROBOTIC ASSITED PARTIAL NEPHRECTOMY: SHX6087

## 2021-07-07 HISTORY — PX: XI ROBOTIC ASSISTED SIMPLE PROSTATECTOMY: SHX6713

## 2021-07-07 LAB — HEMOGLOBIN AND HEMATOCRIT, BLOOD
HCT: 29.7 % — ABNORMAL LOW (ref 39.0–52.0)
Hemoglobin: 9 g/dL — ABNORMAL LOW (ref 13.0–17.0)

## 2021-07-07 SURGERY — NEPHRECTOMY, PARTIAL, ROBOT-ASSISTED
Anesthesia: General | Site: Renal

## 2021-07-07 MED ORDER — CHLORHEXIDINE GLUCONATE 0.12 % MT SOLN
15.0000 mL | Freq: Once | OROMUCOSAL | Status: AC
  Administered 2021-07-07: 15 mL via OROMUCOSAL

## 2021-07-07 MED ORDER — HYDROMORPHONE HCL 1 MG/ML IJ SOLN
INTRAMUSCULAR | Status: AC
  Filled 2021-07-07: qty 1

## 2021-07-07 MED ORDER — STERILE WATER FOR IRRIGATION IR SOLN
Status: DC | PRN
  Administered 2021-07-07: 1000 mL

## 2021-07-07 MED ORDER — LACTATED RINGERS IV SOLN: INTRAVENOUS | Status: DC

## 2021-07-07 MED ORDER — FINASTERIDE 5 MG PO TABS
5.0000 mg | ORAL_TABLET | Freq: Every day | ORAL | Status: DC
  Administered 2021-07-07 – 2021-07-11 (×5): 5 mg via ORAL
  Filled 2021-07-07 (×5): qty 1

## 2021-07-07 MED ORDER — LIDOCAINE 2% (20 MG/ML) 5 ML SYRINGE
INTRAMUSCULAR | Status: AC
  Filled 2021-07-07: qty 5

## 2021-07-07 MED ORDER — DIPHENHYDRAMINE HCL 50 MG/ML IJ SOLN: 12.5000 mg | Freq: Four times a day (QID) | INTRAMUSCULAR | Status: DC | PRN

## 2021-07-07 MED ORDER — CEFAZOLIN SODIUM-DEXTROSE 2-3 GM-%(50ML) IV SOLR
INTRAVENOUS | Status: DC | PRN
  Administered 2021-07-07: 2 g via INTRAVENOUS

## 2021-07-07 MED ORDER — FERROUS SULFATE 325 (65 FE) MG PO TABS
325.0000 mg | ORAL_TABLET | Freq: Two times a day (BID) | ORAL | Status: DC
  Administered 2021-07-07 – 2021-07-11 (×8): 325 mg via ORAL
  Filled 2021-07-07 (×8): qty 1

## 2021-07-07 MED ORDER — HYDROCODONE-ACETAMINOPHEN 5-325 MG PO TABS
1.0000 | ORAL_TABLET | Freq: Four times a day (QID) | ORAL | 0 refills | Status: DC | PRN
  Filled 2021-07-07: qty 20, 4d supply, fill #0

## 2021-07-07 MED ORDER — ORAL CARE MOUTH RINSE: 15.0000 mL | Freq: Once | OROMUCOSAL | Status: AC

## 2021-07-07 MED ORDER — ALBUMIN HUMAN 5 % IV SOLN
INTRAVENOUS | Status: AC
  Filled 2021-07-07: qty 250

## 2021-07-07 MED ORDER — ROCURONIUM BROMIDE 10 MG/ML (PF) SYRINGE
PREFILLED_SYRINGE | INTRAVENOUS | Status: AC
  Filled 2021-07-07: qty 10

## 2021-07-07 MED ORDER — OXYCODONE HCL 5 MG PO TABS
5.0000 mg | ORAL_TABLET | ORAL | Status: DC | PRN
  Administered 2021-07-09 – 2021-07-11 (×4): 5 mg via ORAL
  Filled 2021-07-07 (×4): qty 1

## 2021-07-07 MED ORDER — PROMETHAZINE HCL 25 MG/ML IJ SOLN: 6.2500 mg | INTRAMUSCULAR | Status: DC | PRN

## 2021-07-07 MED ORDER — OXYCODONE HCL 5 MG/5ML PO SOLN
5.0000 mg | Freq: Once | ORAL | Status: DC | PRN
Start: 2021-07-07 — End: 2021-07-07

## 2021-07-07 MED ORDER — PROPOFOL 10 MG/ML IV BOLUS
INTRAVENOUS | Status: AC
  Filled 2021-07-07: qty 20

## 2021-07-07 MED ORDER — HYDROMORPHONE HCL 2 MG/ML IJ SOLN
INTRAMUSCULAR | Status: AC
  Filled 2021-07-07: qty 1

## 2021-07-07 MED ORDER — OXYCODONE HCL 5 MG PO TABS: 5.0000 mg | ORAL_TABLET | Freq: Once | ORAL | Status: DC | PRN

## 2021-07-07 MED ORDER — LIDOCAINE 2% (20 MG/ML) 5 ML SYRINGE
INTRAMUSCULAR | Status: DC | PRN
  Administered 2021-07-07: 20 mg via INTRAVENOUS

## 2021-07-07 MED ORDER — ROCURONIUM BROMIDE 10 MG/ML (PF) SYRINGE
PREFILLED_SYRINGE | INTRAVENOUS | Status: DC | PRN
  Administered 2021-07-07: 40 mg via INTRAVENOUS
  Administered 2021-07-07: 50 mg via INTRAVENOUS
  Administered 2021-07-07: 20 mg via INTRAVENOUS
  Administered 2021-07-07: 50 mg via INTRAVENOUS
  Administered 2021-07-07: 60 mg via INTRAVENOUS

## 2021-07-07 MED ORDER — MEPERIDINE HCL 50 MG/ML IJ SOLN: 6.2500 mg | INTRAMUSCULAR | Status: DC | PRN

## 2021-07-07 MED ORDER — ALBUMIN HUMAN 5 % IV SOLN: INTRAVENOUS | Status: DC | PRN

## 2021-07-07 MED ORDER — DEXAMETHASONE SODIUM PHOSPHATE 10 MG/ML IJ SOLN
INTRAMUSCULAR | Status: DC | PRN
  Administered 2021-07-07: 10 mg via INTRAVENOUS

## 2021-07-07 MED ORDER — BELLADONNA ALKALOIDS-OPIUM 16.2-60 MG RE SUPP: 1.0000 | Freq: Four times a day (QID) | RECTAL | Status: DC | PRN

## 2021-07-07 MED ORDER — FENTANYL CITRATE (PF) 250 MCG/5ML IJ SOLN
INTRAMUSCULAR | Status: AC
  Filled 2021-07-07: qty 5

## 2021-07-07 MED ORDER — ACETAMINOPHEN 500 MG PO TABS
1000.0000 mg | ORAL_TABLET | Freq: Four times a day (QID) | ORAL | Status: AC
  Administered 2021-07-07 – 2021-07-08 (×4): 1000 mg via ORAL
  Filled 2021-07-07 (×4): qty 2

## 2021-07-07 MED ORDER — LACTATED RINGERS IV SOLN: INTRAVENOUS | Status: DC | PRN

## 2021-07-07 MED ORDER — MIDAZOLAM HCL 2 MG/2ML IJ SOLN: 0.5000 mg | Freq: Once | INTRAMUSCULAR | Status: DC | PRN

## 2021-07-07 MED ORDER — DIPHENHYDRAMINE HCL 12.5 MG/5ML PO ELIX: 12.5000 mg | ORAL_SOLUTION | Freq: Four times a day (QID) | ORAL | Status: DC | PRN

## 2021-07-07 MED ORDER — ONDANSETRON HCL 4 MG/2ML IJ SOLN: 4.0000 mg | INTRAMUSCULAR | Status: DC | PRN

## 2021-07-07 MED ORDER — LACTATED RINGERS IR SOLN
Status: DC | PRN
  Administered 2021-07-07 (×2): 1000 mL

## 2021-07-07 MED ORDER — SUGAMMADEX SODIUM 200 MG/2ML IV SOLN
INTRAVENOUS | Status: DC | PRN
  Administered 2021-07-07: 200 mg via INTRAVENOUS

## 2021-07-07 MED ORDER — SODIUM CHLORIDE (PF) 0.9 % IJ SOLN
INTRAMUSCULAR | Status: AC
  Filled 2021-07-07: qty 20

## 2021-07-07 MED ORDER — ACETAMINOPHEN 500 MG PO TABS
1000.0000 mg | ORAL_TABLET | Freq: Once | ORAL | Status: AC
  Administered 2021-07-07: 1000 mg via ORAL
  Filled 2021-07-07: qty 2

## 2021-07-07 MED ORDER — SULFAMETHOXAZOLE-TRIMETHOPRIM 800-160 MG PO TABS
1.0000 | ORAL_TABLET | Freq: Two times a day (BID) | ORAL | 0 refills | Status: DC
  Filled 2021-07-07: qty 6, 3d supply, fill #0

## 2021-07-07 MED ORDER — HYDROMORPHONE HCL 1 MG/ML IJ SOLN: 0.2500 mg | INTRAMUSCULAR | Status: DC | PRN

## 2021-07-07 MED ORDER — DEXTROSE-NACL 5-0.45 % IV SOLN
INTRAVENOUS | Status: DC
Start: 2021-07-07 — End: 2021-07-11

## 2021-07-07 MED ORDER — SODIUM CHLORIDE 0.9 % IV SOLN
INTRAVENOUS | Status: AC
  Filled 2021-07-07: qty 2

## 2021-07-07 MED ORDER — ONDANSETRON HCL 4 MG/2ML IJ SOLN
INTRAMUSCULAR | Status: AC
  Filled 2021-07-07: qty 2

## 2021-07-07 MED ORDER — SODIUM CHLORIDE 0.9 % IV SOLN
INTRAVENOUS | Status: DC | PRN
  Administered 2021-07-07: 40 mL

## 2021-07-07 MED ORDER — FENTANYL CITRATE (PF) 250 MCG/5ML IJ SOLN
INTRAMUSCULAR | Status: DC | PRN
  Administered 2021-07-07: 250 ug via INTRAVENOUS

## 2021-07-07 MED ORDER — SENNOSIDES-DOCUSATE SODIUM 8.6-50 MG PO TABS
2.0000 | ORAL_TABLET | Freq: Every day | ORAL | Status: DC
  Administered 2021-07-07 – 2021-07-10 (×4): 2 via ORAL
  Filled 2021-07-07 (×4): qty 2

## 2021-07-07 MED ORDER — BUPIVACAINE LIPOSOME 1.3 % IJ SUSP
20.0000 mL | Freq: Once | INTRAMUSCULAR | Status: DC
  Filled 2021-07-07: qty 20

## 2021-07-07 MED ORDER — PROPOFOL 10 MG/ML IV BOLUS
INTRAVENOUS | Status: DC | PRN
  Administered 2021-07-07: 100 mg via INTRAVENOUS
  Administered 2021-07-07: 30 mg via INTRAVENOUS

## 2021-07-07 MED ORDER — MAGNESIUM CITRATE PO SOLN
1.0000 | Freq: Once | ORAL | Status: DC
  Filled 2021-07-07: qty 296

## 2021-07-07 MED ORDER — BACITRACIN-NEOMYCIN-POLYMYXIN 400-5-5000 EX OINT: 1.0000 "application " | TOPICAL_OINTMENT | Freq: Three times a day (TID) | CUTANEOUS | Status: DC | PRN

## 2021-07-07 MED ORDER — ONDANSETRON HCL 4 MG/2ML IJ SOLN
INTRAMUSCULAR | Status: DC | PRN
Start: 2021-07-07 — End: 2021-07-07
  Administered 2021-07-07: 4 mg via INTRAVENOUS

## 2021-07-07 MED ORDER — HYDROMORPHONE HCL 1 MG/ML IJ SOLN
0.5000 mg | INTRAMUSCULAR | Status: DC | PRN
  Administered 2021-07-07: 1 mg via INTRAVENOUS
  Filled 2021-07-07: qty 1

## 2021-07-07 MED ORDER — HYDROMORPHONE HCL 1 MG/ML IJ SOLN
INTRAMUSCULAR | Status: DC | PRN
  Administered 2021-07-07: 1 mg via INTRAVENOUS
  Administered 2021-07-07 (×2): .5 mg via INTRAVENOUS

## 2021-07-07 MED ORDER — TAMSULOSIN HCL 0.4 MG PO CAPS
0.4000 mg | ORAL_CAPSULE | Freq: Every day | ORAL | Status: DC
  Administered 2021-07-07: 0.4 mg via ORAL
  Filled 2021-07-07: qty 1

## 2021-07-07 MED ORDER — DOCUSATE SODIUM 100 MG PO CAPS: 100.0000 mg | ORAL_CAPSULE | Freq: Two times a day (BID) | ORAL | Status: DC

## 2021-07-07 SURGICAL SUPPLY — 116 items
ADH SKN CLS APL DERMABOND .7 (GAUZE/BANDAGES/DRESSINGS) ×4
AGENT HMST KT MTR STRL THRMB (HEMOSTASIS) ×2
APL ESCP 34 STRL LF DISP (HEMOSTASIS) ×2
APL PRP STRL LF DISP 70% ISPRP (MISCELLANEOUS) ×2
APL SWBSTK 6 STRL LF DISP (MISCELLANEOUS)
APPLICATOR COTTON TIP 6 STRL (MISCELLANEOUS) ×2 IMPLANT
APPLICATOR COTTON TIP 6IN STRL (MISCELLANEOUS)
APPLICATOR SURGIFLO ENDO (HEMOSTASIS) ×3 IMPLANT
BAG COUNTER SPONGE SURGICOUNT (BAG) IMPLANT
BAG LAPAROSCOPIC 12 15 PORT 16 (BASKET) ×1 IMPLANT
BAG RETRIEVAL 12/15 (BASKET) ×3
BAG SPEC RTRVL LRG 6X4 10 (ENDOMECHANICALS) ×2
BAG SPNG CNTER NS LX DISP (BAG)
CATH FOLEY 2WAY SLVR 18FR 30CC (CATHETERS) ×2 IMPLANT
CATH FOLEY 3WAY 30CC 24FR (CATHETERS)
CATH FOLEY LATEX FREE 22FR (CATHETERS) ×3
CATH FOLEY LF 22FR (CATHETERS) IMPLANT
CATH SILICONE 24FR 5CC 3WAY (CATHETERS) IMPLANT
CATH SILICONE 5CC 18FR (INSTRUMENTS) ×1 IMPLANT
CATH TIEMANN FOLEY 18FR 5CC (CATHETERS) IMPLANT
CATH URTH STD 24FR FL 3W 2 (CATHETERS) ×2 IMPLANT
CHLORAPREP W/TINT 26 (MISCELLANEOUS) ×3 IMPLANT
CLIP SUT LAPRA TY ABSORB (SUTURE) ×5 IMPLANT
CLIP VESOLOCK LG 6/CT PURPLE (CLIP) ×9 IMPLANT
CLIP VESOLOCK MED LG 6/CT (CLIP) ×6 IMPLANT
CLIP VESOLOCK XL 6/CT (CLIP) ×2 IMPLANT
CLOTH BEACON ORANGE TIMEOUT ST (SAFETY) ×3 IMPLANT
COVER SURGICAL LIGHT HANDLE (MISCELLANEOUS) ×5 IMPLANT
COVER TIP SHEARS 8 DVNC (MISCELLANEOUS) ×3 IMPLANT
COVER TIP SHEARS 8MM DA VINCI (MISCELLANEOUS) ×6
CUTTER ECHEON FLEX ENDO 45 340 (ENDOMECHANICALS) IMPLANT
DECANTER SPIKE VIAL GLASS SM (MISCELLANEOUS) ×2 IMPLANT
DERMABOND ADVANCED (GAUZE/BANDAGES/DRESSINGS) ×2
DERMABOND ADVANCED .7 DNX12 (GAUZE/BANDAGES/DRESSINGS) ×2 IMPLANT
DRAIN CHANNEL 15F RND FF 3/16 (WOUND CARE) ×2 IMPLANT
DRAIN CHANNEL RND F F (WOUND CARE) ×1 IMPLANT
DRAPE ARM DVNC X/XI (DISPOSABLE) ×8 IMPLANT
DRAPE COLUMN DVNC XI (DISPOSABLE) ×2 IMPLANT
DRAPE DA VINCI XI ARM (DISPOSABLE) ×12
DRAPE DA VINCI XI COLUMN (DISPOSABLE) ×3
DRAPE INCISE IOBAN 66X45 STRL (DRAPES) ×5 IMPLANT
DRAPE SHEET LG 3/4 BI-LAMINATE (DRAPES) ×4 IMPLANT
DRAPE SURG IRRIG POUCH 19X23 (DRAPES) ×3 IMPLANT
DRSG TEGADERM 4X4.75 (GAUZE/BANDAGES/DRESSINGS) ×4 IMPLANT
ELECT PENCIL ROCKER SW 15FT (MISCELLANEOUS) ×3 IMPLANT
ELECT REM PT RETURN 15FT ADLT (MISCELLANEOUS) ×3 IMPLANT
EVACUATOR SILICONE 100CC (DRAIN) ×3 IMPLANT
GAUZE SPONGE 2X2 8PLY STRL LF (GAUZE/BANDAGES/DRESSINGS) ×1 IMPLANT
GLOVE SURG ENC MOIS LTX SZ6.5 (GLOVE) ×1 IMPLANT
GLOVE SURG ENC TEXT LTX SZ7.5 (GLOVE) ×4 IMPLANT
GLOVE SURG POLYISO LF SZ6.5 (GLOVE) ×3 IMPLANT
GLOVE SURG POLYISO LF SZ7.5 (GLOVE) ×5 IMPLANT
GLOVE SURG UNDER POLY LF SZ6 (GLOVE) ×2 IMPLANT
GLOVE SURG UNDER POLY LF SZ7 (GLOVE) ×2 IMPLANT
GLOVE SURG UNDER POLY LF SZ7.5 (GLOVE) ×3 IMPLANT
GOWN STRL REUS W/TWL LRG LVL3 (GOWN DISPOSABLE) ×13 IMPLANT
GOWN STRL REUS W/TWL XL LVL3 (GOWN DISPOSABLE) ×2 IMPLANT
HEMOSTAT SURGICEL 4X8 (HEMOSTASIS) ×3 IMPLANT
HOLDER FOLEY CATH W/STRAP (MISCELLANEOUS) ×3 IMPLANT
IRRIG SUCT STRYKERFLOW 2 WTIP (MISCELLANEOUS) ×6
IRRIGATION SUCT STRKRFLW 2 WTP (MISCELLANEOUS) ×2 IMPLANT
IV LACTATED RINGERS 1000ML (IV SOLUTION) ×4 IMPLANT
KIT BASIN OR (CUSTOM PROCEDURE TRAY) ×3 IMPLANT
KIT TURNOVER KIT A (KITS) ×3 IMPLANT
LOOP VESSEL MAXI BLUE (MISCELLANEOUS) ×3 IMPLANT
MARKER SKIN DUAL TIP RULER LAB (MISCELLANEOUS) ×3 IMPLANT
NDL INSUFFLATION 14GA 120MM (NEEDLE) ×2 IMPLANT
NEEDLE INSUFFLATION 14GA 120MM (NEEDLE) ×3 IMPLANT
NS IRRIG 1000ML POUR BTL (IV SOLUTION) ×1 IMPLANT
PACK ROBOT UROLOGY CUSTOM (CUSTOM PROCEDURE TRAY) ×3 IMPLANT
PAD POSITIONING PINK XL (MISCELLANEOUS) ×3 IMPLANT
PENCIL SMOKE EVACUATOR (MISCELLANEOUS) IMPLANT
PLUG CATH AND CAP STER (CATHETERS) ×2 IMPLANT
PORT ACCESS TROCAR AIRSEAL 12 (TROCAR) ×2 IMPLANT
PORT ACCESS TROCAR AIRSEAL 5M (TROCAR) ×2
POUCH SPECIMEN RETRIEVAL 10MM (ENDOMECHANICALS) ×3 IMPLANT
PROTECTOR NERVE ULNAR (MISCELLANEOUS) ×7 IMPLANT
RELOAD STAPLE 45 2.6 WHT THIN (STAPLE) IMPLANT
RELOAD STAPLE 45 4.1 GRN THCK (STAPLE) IMPLANT
SEAL CANN UNIV 5-8 DVNC XI (MISCELLANEOUS) ×8 IMPLANT
SEAL XI 5MM-8MM UNIVERSAL (MISCELLANEOUS) ×12
SET TRI-LUMEN FLTR TB AIRSEAL (TUBING) ×3 IMPLANT
SOLUTION ELECTROLUBE (MISCELLANEOUS) ×3 IMPLANT
SPONGE GAUZE 2X2 STER 10/PKG (GAUZE/BANDAGES/DRESSINGS) ×1
SPONGE T-LAP 4X18 ~~LOC~~+RFID (SPONGE) ×3 IMPLANT
STAPLE RELOAD 45 GRN (STAPLE) ×2 IMPLANT
STAPLE RELOAD 45 WHT (STAPLE) IMPLANT
STAPLE RELOAD 45MM GREEN (STAPLE) ×3
STAPLE RELOAD 45MM WHITE (STAPLE)
SURGIFLO W/THROMBIN 8M KIT (HEMOSTASIS) ×3 IMPLANT
SUT ETHILON 3 0 PS 1 (SUTURE) ×3 IMPLANT
SUT MNCRL AB 4-0 PS2 18 (SUTURE) ×8 IMPLANT
SUT PDS AB 1 CT1 27 (SUTURE) ×7 IMPLANT
SUT V-LOC BARB 180 2/0GR6 GS22 (SUTURE) ×6
SUT VIC AB 0 CT1 27 (SUTURE) ×27
SUT VIC AB 0 CT1 27XBRD ANTBC (SUTURE) ×8 IMPLANT
SUT VIC AB 2-0 SH 27 (SUTURE) ×6
SUT VIC AB 2-0 SH 27X BRD (SUTURE) ×4 IMPLANT
SUT VIC AB 3-0 SH 27 (SUTURE) ×3
SUT VIC AB 3-0 SH 27XBRD (SUTURE) IMPLANT
SUT VICRYL 0 UR6 27IN ABS (SUTURE) ×5 IMPLANT
SUT VLOC BARB 180 ABS3/0GR12 (SUTURE) ×6
SUTURE V-LC BRB 180 2/0GR6GS22 (SUTURE) ×4 IMPLANT
SUTURE VLOC BRB 180 ABS3/0GR12 (SUTURE) ×4 IMPLANT
SYR 30ML LL (SYRINGE) ×2 IMPLANT
SYR BULB IRRIG 60ML STRL (SYRINGE) ×1 IMPLANT
TOWEL OR 17X26 10 PK STRL BLUE (TOWEL DISPOSABLE) ×3 IMPLANT
TOWEL OR NON WOVEN STRL DISP B (DISPOSABLE) ×3 IMPLANT
TRAY FOL W/BAG SLVR 16FR STRL (SET/KITS/TRAYS/PACK) ×1 IMPLANT
TRAY FOLEY MTR SLVR 16FR STAT (SET/KITS/TRAYS/PACK) ×2 IMPLANT
TRAY FOLEY W/BAG SLVR 16FR LF (SET/KITS/TRAYS/PACK) ×3
TRAY LAPAROSCOPIC (CUSTOM PROCEDURE TRAY) ×3 IMPLANT
TROCAR BLADELESS OPT 5 100 (ENDOMECHANICALS) IMPLANT
TROCAR XCEL 12X100 BLDLESS (ENDOMECHANICALS) ×3 IMPLANT
TROCAR XCEL NON-BLD 5MMX100MML (ENDOMECHANICALS) ×1 IMPLANT
WATER STERILE IRR 1000ML POUR (IV SOLUTION) ×4 IMPLANT

## 2021-07-07 NOTE — Brief Op Note (Signed)
07/07/2021  2:26 PM  PATIENT:  Marcus Hatfield  75 y.o. male  PRE-OPERATIVE DIAGNOSIS:  LEFT RENAL MASS, MASSIVE PROSTATE WITH RETENTION  POST-OPERATIVE DIAGNOSIS:  LEFT RENAL MASS, MASSIVE PROSTATE WITH RETENTIONLEFT RENAL MASS, MASSIVE PROSTATE WITH RETENTION  PROCEDURE:  Procedure(s) with comments: XI ROBOTIC ASSITED PARTIAL NEPHRECTOMY (Left) - 5 HRS XI ROBOTIC ASSISTED SIMPLE PROSTATECTOMY (N/A) Intraoperative Ultrasound with Interpretation  SURGEON:  Surgeon(s) and Role:    Alexis Frock, MD - Primary  PHYSICIAN ASSISTANT:   ASSISTANTS: Debbrah Alar PA   ANESTHESIA:   local and general  EBL:  500 mL   BLOOD ADMINISTERED:none  DRAINS:  1 - 3 way foley to gravity (irrigation port plugged); 2 - JP to gravity    LOCAL MEDICATIONS USED:  MARCAINE     SPECIMEN:  Source of Specimen:  left renal mass, left renal cyst wall, deep margin of left renal mass, large prostate adenoma  DISPOSITION OF SPECIMEN:  PATHOLOGY  COUNTS:  YES  TOURNIQUET:  * No tourniquets in log *  DICTATION: .Other Dictation: Dictation Number [id number]  PLAN OF CARE: Admit to inpatient   PATIENT DISPOSITION:  PACU - hemodynamically stable.   Delay start of Pharmacological VTE agent (>24hrs) due to surgical blood loss or risk of bleeding: yes

## 2021-07-07 NOTE — Anesthesia Procedure Notes (Signed)
Arterial Line Insertion Start/End7/20/2022 7:45 AM, 07/07/2021 8:00 AM Performed by: Myna Bright, CRNA, CRNA  Patient location: Pre-op. Preanesthetic checklist: patient identified, IV checked, risks and benefits discussed, surgical consent, monitors and equipment checked, pre-op evaluation and timeout performed Lidocaine 1% used for infiltration Right, radial was placed Catheter size: 20 G Hand hygiene performed  and maximum sterile barriers used  Allen's test indicative of satisfactory collateral circulation Attempts: 1 Procedure performed without using ultrasound guided technique. Following insertion, Biopatch and dressing applied. Post procedure assessment: normal  Patient tolerated the procedure well with no immediate complications.

## 2021-07-07 NOTE — Discharge Instructions (Signed)

## 2021-07-07 NOTE — Transfer of Care (Signed)
Immediate Anesthesia Transfer of Care Note  Patient: Marcus Hatfield  Procedure(s) Performed: XI ROBOTIC ASSITED PARTIAL NEPHRECTOMY (Left: Renal) XI ROBOTIC ASSISTED SIMPLE PROSTATECTOMY (Prostate)  Patient Location: PACU  Anesthesia Type:General  Level of Consciousness: awake, alert , oriented and patient cooperative  Airway & Oxygen Therapy: Patient Spontanous Breathing and Patient connected to face mask oxygen  Post-op Assessment: Report given to RN, Post -op Vital signs reviewed and stable and Patient moving all extremities  Post vital signs: Reviewed and stable  Last Vitals:  Vitals Value Taken Time  BP 144/93 07/07/21 1435  Temp    Pulse 92 07/07/21 1437  Resp 12 07/07/21 1437  SpO2 99 % 07/07/21 1437  Vitals shown include unvalidated device data.  Last Pain:  Vitals:   07/07/21 0721  TempSrc: Oral  PainSc:       Patients Stated Pain Goal: 3 (67/12/45 8099)  Complications: No notable events documented.

## 2021-07-07 NOTE — Anesthesia Procedure Notes (Signed)
Procedure Name: Intubation Date/Time: 07/07/2021 8:43 AM Performed by: Myna Bright, CRNA Pre-anesthesia Checklist: Patient identified, Emergency Drugs available, Suction available and Patient being monitored Patient Re-evaluated:Patient Re-evaluated prior to induction Oxygen Delivery Method: Circle system utilized Preoxygenation: Pre-oxygenation with 100% oxygen Induction Type: IV induction Ventilation: Mask ventilation without difficulty Laryngoscope Size: Mac and 4 Grade View: Grade I Tube type: Oral Tube size: 7.5 mm Number of attempts: 1 Airway Equipment and Method: Stylet Placement Confirmation: ETT inserted through vocal cords under direct vision, positive ETCO2 and breath sounds checked- equal and bilateral Secured at: 22 cm Tube secured with: Tape Dental Injury: Teeth and Oropharynx as per pre-operative assessment

## 2021-07-07 NOTE — OR Nursing (Signed)
Removed patient foley at 0833 per protocol.

## 2021-07-07 NOTE — H&P (Signed)
Marcus Hatfield is an 75 y.o. male.    Chief Complaint: PRe-OP LEFT Partial Nephrectomy / Simple Prostatectomy  HPI:   1 - Massive Prostate With Recurrent Gross Hematuria - 600gm prostate by ER CT 05/02/21 on eval gross hemautira. Negative BX in past and stable high PSA 60-70 range x years. Follows Dr. Greer Hatfield at [facility]. Amazingly not on finasteride at baseline (now started 2022). Firmagon 240 given 05/05/21.   2 - Urosepsis - lactic acidosis, fevers, leukocytosis, bacteruria c/w early urosepsis. Placed on empiric rocpehin pending further CX data.   3 - Multifocal, Recurrent Renal Cancer - s/p RIGHT nephrectomy (after cryo recurrence) and LEFT renal cryoblation 2017 for low grade papillary cancers. Recurrence 2021 of new LEFT sided lower pole tumor now 3cm by CT 2022, pt has refused ablation. 1 artery / 1 vein left renovascular anatomy, Mass 30% exophytic, extreme lower pole, in background of simple cysts.   4 - Chronic Renal Failure / Solitary Left Kidney - s/p Rt nephrectomy 2017 for cancer. Baseline Cr 1.8. No sig left hydro of solitary kidney on imaging x several.   PMH sig for HTN. NO CV disease / blood thinners. His PCP is Marcus Sages MD.   Today "Marcus Hatfield" is seen to proceed with LEFT partial nephrectomy + Simple Prostatecotmy. No interval fevers. C19 screen negative, Hgb 10.7 Cr 1.57.    Past Medical History:  Diagnosis Date   Arthritis    CKD (chronic kidney disease) stage 3, GFR 30-59 ml/min (HCC) 20015   Creat 1.9   Hypertension    Multiple myeloma ([place]) 2016   [facility] heme onc   Prostate disorder 02/2017    Past Surgical History:  Procedure Laterality Date   EYE SURGERY Bilateral 01/30/2018   KNEE SURGERY     over ten years ago   NEPHRECTOMY Right 03/2015    Family History  Problem Relation Age of Onset   Diabetes Father    Hypertension Brother    Social History:  reports that he has never smoked. He has never used smokeless tobacco. He reports that he does not drink alcohol  and does not use drugs.  Allergies:  Allergies  Allergen Reactions   Penicillins Hives    Has patient had a PCN reaction causing immediate rash, facial/tongue/throat swelling, SOB or lightheadedness with hypotension:Yes Has patient had a PCN reaction causing severe rash involving mucus membranes or skin necrosis: Yes Has patient had a PCN reaction that required hospitalization No Has patient had a PCN reaction occurring within the last 10 years: No If all of the above answers are "NO", then may proceed with Cephalosporin use.    Latex Rash    Medications Prior to Admission  Medication Sig Dispense Refill   Ascorbic Acid (VITAMIN C) 1000 MG tablet Take 1,000 mg by mouth every other day.     cholecalciferol (VITAMIN D3) 25 MCG (1000 UNIT) tablet Take 1,000 Units by mouth every other day.     ferrous sulfate 325 (65 FE) MG tablet Take 1 tablet (325 mg total) by mouth 2 (two) times daily with a meal. 180 tablet 0   finasteride (PROSCAR) 5 MG tablet Take 1 tablet (5 mg total) by mouth daily. 90 tablet 0   fluticasone (FLONASE) 50 MCG/ACT nasal spray Place 2 sprays into both nostrils daily as needed for allergies.     lactulose (CHRONULAC) 10 GM/15ML solution Take 10 g by mouth daily as needed for constipation.  5   Multiple Vitamins-Minerals (MULTIVITAMIN WITH MINERALS) tablet Take  1 tablet by mouth daily.     tamsulosin (FLOMAX) 0.4 MG CAPS capsule Take 1 capsule (0.4 mg total) by mouth daily after supper. (Patient taking differently: Take 0.4 mg by mouth every other day.) 30 capsule 1    Results for orders placed or performed during the hospital encounter of 07/07/21 (from the past 48 hour(s))  Type and screen [place]     Status: None   Collection Time: 07/07/21  6:39 AM  Result Value Ref Range   ABO/RH(D) B POS    Antibody Screen NEG    Sample Expiration      07/10/2021,2359 Performed at [facility], [place] [address], [place], [place]  [address]    No results found.  Review of Systems  Constitutional:  Negative for chills and fever.  Genitourinary:  Positive for difficulty urinating and hematuria.  All other systems reviewed and are negative.  Blood pressure 137/90, pulse 72, temperature 98.6 F (37 C), temperature source Oral, resp. rate 18, height 5' 8"  (1.727 m), weight 81.6 kg, SpO2 100 %. Physical Exam Vitals reviewed.  HENT:     Head: Normocephalic.     Nose: Nose normal.  Eyes:     Pupils: Pupils are equal, round, and reactive to light.  Cardiovascular:     Rate and Rhythm: Normal rate.  Pulmonary:     Effort: Pulmonary effort is normal.  Abdominal:     General: Abdomen is flat.  Genitourinary:    Comments: Foley in place with non-foul urine. No CVAT.  Musculoskeletal:     Cervical back: Normal range of motion.  Neurological:     General: No focal deficit present.  Psychiatric:        Mood and Affect: Mood normal.     Assessment/Plan  Proceed as planned with LEFT robotic partial nephrectomy + simple prostatectomy with goal of renal tumro free and eventually catheter / hematuria free. Risks, benefits, alternatives, expected peri-op course (incluidng his baseline anemia and need for transfusion) discussed previously and reiterated today.   Alexis Frock, MD 07/07/2021, 8:06 AM

## 2021-07-07 NOTE — Op Note (Signed)
NAMESHANNON, KIRKENDALL MEDICAL RECORD NO: [id number] ACCOUNT NO: [id number] DATE OF BIRTH: [birth year] FACILITY: [facility] LOCATION: [id number] PHYSICIAN: Alexis Frock, MD  Operative Report   DATE OF PROCEDURE: 07/07/2021  PREOPERATIVE DIAGNOSES: 1.  Left renal mass, history of multifocal renal cell carcinoma. 2.  Massive prostate with urinary retention. 3.  Left renal cyst.  PROCEDURES:    1.  Robotic-assisted laparoscopic left partial nephrectomy. 2.  Intraoperative ultrasound with interpretation. 3.  Robotic-assisted laparoscopic simple prostatectomy.  ESTIMATED BLOOD LOSS:  500 mL (50 mL with partial nephrectomy, 400 mL with simple prostatectomy).  FIRST ASSISTANT:  Debbrah Alar, PA  DRAINS:    1.  Jackson-Pratt drain to bulb suction. 2.  Three-way Foley catheter to straight drain irrigation port plugged, 30 mL of water in the balloon 3-way type.  FINDINGS:    1.  Simple-appearing left lower pole anterior renal cyst. 2.  Solid vascular lower left medial renal mass. 3.  Massive trilobar prostatic hypertrophy. 4.  Large right-sided ventral hernia.  INDICATIONS:  The patient is a very pleasant 75 year old man with a longstanding history of obstructive voiding symptoms.  He has been managed at another facility.  He also has a history of multifocal renal cell carcinoma, status post bilateral  cryoablation as well as right nephrectomy, who was emergently seen in consultation earlier this year with urinary retention and hemodynamically significant prostatic bleeding from his very large prostatic hypertrophy. He was also noted to have a new  left lower pole solid renal mass and a solitary kidney.  At the time his bleeding was hemodynamically significant, he underwent temporizing measure with catheterization on irrigation as well as LHRH antagonist to reduce prostatic growth factors to  the prostate.  This fortunately resulted in temporizing, but he was in refractory retention.  His prostate size  measured to be 600 grams.  Options were discussed for the constellation of problems including aggressive path with left partial nephrectomy  with goal of removing recurrent cancer as well as simple prostatectomy with goal of catheter free.  He adamantly wished to proceed.  Informed consent was then placed in medical record.  He has been on preoperative antibiotics according to culture.  DESCRIPTION OF PROCEDURE:  The patient being [employer] verified.  Procedure being simple prostatectomy and left partial nephrectomy was confirmed.  Procedure timeout was performed.  Intravenous antibiotics were administered.  General endotracheal  anesthesia was induced.  His in situ Foley catheter was exchanged for a new one.  The patient was placed into the left side up full flank position, pulling 15 degrees of table flexion, superior arm elevator, axillary roll, sequential compression devices,  bottom leg bent, top leg straight.  He was further fastened on the operating table using 3-inch tape over foam padding across his supraxiphoid chest and his pelvis.  Beanbag was deployed. Sterile field was created prepping and draping the patient's  entire left flank and abdomen using chlorhexidine gluconate.  Next, a high flow, low pressure pneumoperitoneum was obtained using Veress technique in the left lower quadrant having passed the aspiration and drop test.  Next, an 8 mm robotic camera port  was placed in position approximately 1 handbreadth superior and lateral to the umbilicus.  Laparoscopic examination of the peritoneal cavity revealed no adhesions and no visceral injury.  Additional ports were placed as follows:  Left subcostal 8 mm  robotic port, left far lateral inferior 8 mm robotic port approximately 4 fingerbreadths superior medial to the anterior iliac spine, left inferior paramedian robotic  port approximately 1 handbreadth superior to the pubic ramus, and two 12 mm assistant  port sites in the midline, one in  position approximately 3 fingerbreadths superior to the umbilicus and another 2 fingerbreadths superior to the end of the camera port, superior one being [employer] type. Robot was docked and passed the electronic checks.   Next, attention was directed to the development of the left retroperitoneum.  Incision was made lateral to the descending colon from the area of the splenic flexure toward the area of the internal ring.  The descending colon was carefully swept medially.  There was some mild desmoplastic reaction on the anterior surface of Gerota's likely due to prior cryoablation; however, this was not severe.  Lower pole of the kidney was identified, placed on gentle lateral traction.  Dissection proceeded medial to  this.   The gonadal vessels and ureter were encountered.  These were swept laterally.  The triangle between the ureter, gonadal vessels, and psoas musculature was carefully developed towards the area of the renal hilum on the left side.  Renal hilum was  consisting of single artery, single vein renal vascular anatomy as anticipated.  Artery was circumferentially mobilized and marked with vessel loop.  Attention was directed at identification of the lower pole solid mass. Based on preoperative imaging, he  did notably have a relatively simple-appearing cyst in direct opposition to the mass, and the cyst was easily seen in the lower pole anterior area.  This was carefully mobilized circumferentially, purposely incised to drain simple fluid.  The wall was  excised, set aside for permanent pathology, labeled as left lower pole cyst wall, and base of this was fulgurated to help prevent recurrence and removed the area of cyst.  The area of mass was then much easily identified, intraoperative treatment more  favorable.  The deep aspect was somewhat hard to appreciate initially; therefore, intraoperative ultrasound was performed.  Intraoperative ultrasound using a drop-in probe revealed a solid  somewhat vascular appearing lower pole medial renal mass.  Using a combination of gross visual and ultrasound cues, the boundaries for partial nephrectomy carefully scored at the level of  the kidney.  Warm ischemia was then achieved using 2 bulldog clamps on the artery, and using partial nephrectomy with enucleation technique of the deep aspect, partial nephrectomy was carefully performed using cold scissors.  I was quite happy with the  gross area of resection, set aside for retrieval.  A deep margin was set aside also for permanent pathology, but again was grossly negative.  There were no obvious injuries of the collecting system.  Again, given partial enucleation technique, hemostasis  was quite good.  Bulldog clamps were removed.  Total warm ischemia time of 9 minutes.  Renorrhaphy was performed using a bolster sandwiched between 3 interrupted 0 Vicryls with Hem-o-loks and Lapra-Tys, which resulted in excellent parenchymal apposition around the bolster and excellent hemostasis.  Sponge and needle counts were correct.  Hemostasis was very good.  I was quite happy with the partial nephrectomy aspect of the procedure today.  The vessel loop was removed smoothly with  blood loss of only about 50 mL.  It was clearly felt that proceeding with second phase of surgery with the prostatectomy would be prudent.  The superior most 12 mm assistant port site was closed at the fascia using interrupted Vicryl on UR needle and the  superior 2 most robotic port sites were closed at the level of the skin using subcuticular Monocryl and Dermabond.  The remaining 3 ports inferior were covered with Ioban.  The patient was then repositioned into a low lithotomy position, pulling gel  rolls, his arms were tucked to the side, padding his lateral knees.  He was further fastened on the operating table using 3-inch tape over foam padding across supraxiphoid chest.  A test of steep Trendelenburg positioning was performed, found to  be  suitably positioned.  The in situ Ioban covering the prior three port sites to be used for the prostate portion of procedure was removed.  A new sterile field was created prepping and draping the patient's infraxiphoid abdomen using chlorhexidine  gluconate, and his penis, perineum, and proximal thigh using iodine, and the Foley catheter was placed to straight drain, then prepped clean on the field.  A 12 mm trocar without the obturator was then very carefully introduced bluntly to the previous  supraumbilical port site.  Pneumoperitoneum was re-achieved.  Under laparoscopic vision, a left paramedian robotic port site was introduced in the left far lateral for the pelvis portion utilizing the previous port sites.  These were repositioned for  this. Additional right paramedian 8 mm robotic port and right superior paramedian 5 mm suction port were then placed. A right far lateral 12 mm assistant port site was also placed.  Considerable care was given to placement of this as there was a fairly  large ventral hernia, which contained a significant amount of non-strangulated large and small bowel.  Using [employer], the area was attempted to be reduced; however, there were some adhesions within the aspect of this such that it was actually  more hazardous than fruitful. Therefore, the lateral most port site was then placed approximately 4 cm superior to this as to avoid manipulation of the hernia area as again it has not been symptomatic and the patient is not bothered by it.  Robot was  then re-docked, passed the electronic checks.  Attention was directed to the development of the space of Retzius.  Incision was made lateral to the right medial umbilical ligament from the midline towards the area of internal ring coursing along the  iliac vessels towards the area of the right ureter.  Right vas deferens was encountered, ligated using a medial bucket handle.  A mirror resection was performed on the left  side.  Next, an inverted semilunar cystotomy was created approximately 2 cm  superior to the presumed aspect of the large prostate adenoma.  Again, there was significant mass effect from the adenoma as anticipated.  This resulted in excellent cystotomy with great visualization of what turned out to be trilobar prostatic  hypertrophy.  Again, the abdomen was quite large.  Two stay sutures were applied into the median lobe, right and left respectively, and two additional stay sutures, one into the right lobe and one into the left lobe, to manipulate the adenoma, lifting  the large median lobe superior anteriorly.  The trigone area was clearly visualized, and the left ureteral orifice was patent with clear urine as anticipated.  Attention was directed at posterior dissection.  Incision was made approximately 2 cm distal  to the area of the ureteral orifice trigone plane into the mucosa overlying the large median lobe, and the posterior plane was carefully entered in the adenoma plane.  This was continued in the midline inferiorly between the prostate adenoma and capsule  all the way to the area of the prostatic apex.  I was quite happy with the plane of dissection.  This was  then carried laterally to the 2 o'clock and 10 o'clock positions respectively.  Using the more lateral stay sutures for retraction, the plane was  then carried laterally and then finally anteriorly on each side, thus, completely circumferentially gaining control of the massive adenoma.  Final apical control was achieved by cutting along the catheter the anterior commissure of the adenoma in a  butterfly type fashion to visualize the apex, and finally, apical release was performed on each side, which freed up the massive adenoma. It was placed in an extra EndoCatch bag for later retrieval.  Additional hemostasis was obtained using point  cautery.  Notably, the dorsal venous complex was controlled immediately before cystotomy using a green  load stapler taking exquisite care to avoid membranous urethral injury.  Next, the posterior mucosal advancement was performed using double-armed 3-0  V-Loc suture reapproximating the bladder neck/trigone mucosa bringing this in apposition to the membranous urethra, thus, covering the posterior aspect of the adenoma resection. This also further improved hemostasis.  This was performed for approximately  50% of the circumference.  The left ureteral orifice was then again visualized and was visually patent again with clear urine.  The cystotomy was then closed using 2 separate suture lines of running 2-0 V-Loc meeting in the midline and tying together,  and a 24-French 3-way Foley catheter was placed per urethra to straight drain with 30 mL of water in the balloon.  This irrigated quantitatively. Sponge and needle counts were again correct.  I was quite happy with the prostate adenoma portion dissection  today.  Closed suction drain was brought out the previous left lateral most robotic port site into the peritoneal cavity.  The right lateral most assistant port site was closed at the level of fascia using Carter-Thomason suture passer and 0 Vicryl.   Robot was then undocked.  The specimen was retrieved by extending the previous camera port site superiorly and inferiorly for a distance total approximately 5 cm due to very large adenoma specimen, was set aside for permanent pathology, and the smaller  left renal mass was in a small EndoCatch bag, was set aside, labeled as such, sent for permanent pathology.  The extraction site was closed at the level of the fascia using figure-of-eight PDS x5 followed by reapproximation of Scarpa's with running  Vicryl.  All incision sites were infiltrated with dilute lipolyzed Marcaine and closed at the level of skin using subcuticular Monocryl and Dermabond.  The procedure was then terminated.  The patient tolerated the procedure well with no immediate  perioperative  complications.  The patient was taken to postanesthesia care unit in stable condition.  Plan for inpatient admission.  Please note first assistant, Bari Mantis, was crucial for all portions of the surgery today.  She provided invaluable retraction, suctioning, vascular clipping, vascular stapling, vascular clamping, specimen manipulation, and general first assistance.   ROH D: 07/07/2021 2:47:50 pm T: 07/07/2021 6:22:00 pm  JOB: [id number]/ [id number]

## 2021-07-07 NOTE — Anesthesia Postprocedure Evaluation (Signed)
Anesthesia Post Note  Patient: Stage manager  Procedure(s) Performed: XI ROBOTIC ASSITED PARTIAL NEPHRECTOMY (Left: Renal) XI ROBOTIC ASSISTED SIMPLE PROSTATECTOMY (Prostate)     Patient location during evaluation: PACU Anesthesia Type: General Level of consciousness: patient cooperative, oriented and sedated Pain management: pain level controlled Vital Signs Assessment: post-procedure vital signs reviewed and stable Respiratory status: spontaneous breathing, nonlabored ventilation, respiratory function stable and patient connected to nasal cannula oxygen Cardiovascular status: blood pressure returned to baseline and stable Postop Assessment: no apparent nausea or vomiting Anesthetic complications: no   No notable events documented.  Last Vitals:  Vitals:   07/07/21 1623 07/07/21 1632  BP: (!) 139/99   Pulse: (!) 122 (!) 119  Resp: 16   Temp: 36.8 C   SpO2: 100%                  Keilee Denman,E. Bernardine Langworthy

## 2021-07-08 ENCOUNTER — Other Ambulatory Visit (HOSPITAL_COMMUNITY): Payer: Self-pay

## 2021-07-08 ENCOUNTER — Encounter (HOSPITAL_COMMUNITY): Payer: Self-pay | Admitting: Urology

## 2021-07-08 DIAGNOSIS — N401 Enlarged prostate with lower urinary tract symptoms: Secondary | ICD-10-CM | POA: Diagnosis present

## 2021-07-08 DIAGNOSIS — Z880 Allergy status to penicillin: Secondary | ICD-10-CM | POA: Diagnosis not present

## 2021-07-08 DIAGNOSIS — C9000 Multiple myeloma not having achieved remission: Secondary | ICD-10-CM | POA: Diagnosis not present

## 2021-07-08 DIAGNOSIS — K439 Ventral hernia without obstruction or gangrene: Secondary | ICD-10-CM | POA: Diagnosis not present

## 2021-07-08 DIAGNOSIS — D62 Acute posthemorrhagic anemia: Secondary | ICD-10-CM | POA: Diagnosis not present

## 2021-07-08 DIAGNOSIS — Z91040 Latex allergy status: Secondary | ICD-10-CM | POA: Diagnosis not present

## 2021-07-08 DIAGNOSIS — N281 Cyst of kidney, acquired: Secondary | ICD-10-CM | POA: Diagnosis present

## 2021-07-08 DIAGNOSIS — C642 Malignant neoplasm of left kidney, except renal pelvis: Secondary | ICD-10-CM | POA: Diagnosis not present

## 2021-07-08 DIAGNOSIS — Z905 Acquired absence of kidney: Secondary | ICD-10-CM | POA: Diagnosis not present

## 2021-07-08 DIAGNOSIS — Z833 Family history of diabetes mellitus: Secondary | ICD-10-CM | POA: Diagnosis not present

## 2021-07-08 DIAGNOSIS — I129 Hypertensive chronic kidney disease with stage 1 through stage 4 chronic kidney disease, or unspecified chronic kidney disease: Secondary | ICD-10-CM | POA: Diagnosis not present

## 2021-07-08 DIAGNOSIS — Z8249 Family history of ischemic heart disease and other diseases of the circulatory system: Secondary | ICD-10-CM | POA: Diagnosis not present

## 2021-07-08 DIAGNOSIS — Z20822 Contact with and (suspected) exposure to covid-19: Secondary | ICD-10-CM | POA: Diagnosis not present

## 2021-07-08 DIAGNOSIS — Z888 Allergy status to other drugs, medicaments and biological substances status: Secondary | ICD-10-CM | POA: Diagnosis not present

## 2021-07-08 DIAGNOSIS — R338 Other retention of urine: Secondary | ICD-10-CM | POA: Diagnosis present

## 2021-07-08 DIAGNOSIS — N1831 Chronic kidney disease, stage 3a: Secondary | ICD-10-CM | POA: Diagnosis not present

## 2021-07-08 LAB — BASIC METABOLIC PANEL
Anion gap: 12 (ref 5–15)
BUN: 23 mg/dL (ref 8–23)
CO2: 21 mmol/L — ABNORMAL LOW (ref 22–32)
Calcium: 8.1 mg/dL — ABNORMAL LOW (ref 8.9–10.3)
Chloride: 103 mmol/L (ref 98–111)
Creatinine, Ser: 2.1 mg/dL — ABNORMAL HIGH (ref 0.61–1.24)
GFR, Estimated: 32 mL/min — ABNORMAL LOW (ref >60)
Glucose, Bld: 187 mg/dL — ABNORMAL HIGH (ref 70–99)
Potassium: 4.9 mmol/L (ref 3.5–5.1)
Sodium: 136 mmol/L (ref 135–145)

## 2021-07-08 LAB — HEMOGLOBIN AND HEMATOCRIT, BLOOD
HCT: 26.8 % — ABNORMAL LOW (ref 39.0–52.0)
Hemoglobin: 8.1 g/dL — ABNORMAL LOW (ref 13.0–17.0)

## 2021-07-08 MED ORDER — CHLORHEXIDINE GLUCONATE CLOTH 2 % EX PADS
6.0000 | MEDICATED_PAD | Freq: Every day | CUTANEOUS | Status: DC
  Administered 2021-07-08: 6 via TOPICAL

## 2021-07-08 MED ORDER — SODIUM CHLORIDE 0.9 % IV BOLUS
250.0000 mL | Freq: Once | INTRAVENOUS | Status: AC
  Administered 2021-07-08: 250 mL via INTRAVENOUS

## 2021-07-08 MED ORDER — CHLORHEXIDINE GLUCONATE CLOTH 2 % EX PADS
6.0000 | MEDICATED_PAD | Freq: Every day | CUTANEOUS | Status: DC
  Administered 2021-07-09 – 2021-07-11 (×3): 6 via TOPICAL

## 2021-07-08 NOTE — Plan of Care (Signed)
  Problem: Clinical Measurements: Goal: Cardiovascular complication will be avoided Outcome: Progressing   Problem: Education: Goal: Knowledge of General Education information will improve Description: Including pain rating scale, medication(s)/side effects and non-pharmacologic comfort measures Outcome: Completed/Met   Problem: Activity: Goal: Risk for activity intolerance will decrease Outcome: Completed/Met   Problem: Coping: Goal: Level of anxiety will decrease Outcome: Completed/Met

## 2021-07-08 NOTE — Progress Notes (Signed)
1 Day Post-Op   Subjective/Chief Complaint:  1 - Left Renal Mass, Massive Prostate With Urinary Retention- s/p robotic LEFT partial nephrectomy and simple prostatectomy on 07/07/2021 for enlargeing left renal mass and massive prostate with refracotry retention and bleeding. Pre-op Hgb 10.7, 8.1 POD 1.   Today "Marcus Hatfield" is stable. Hgb down some as anticipated with acute on chronic anemia. No fevers. Path pending.    Objective: Vital signs in last 24 hours: Temp:  [97.6 F (36.4 C)-98.6 F (37 C)] 98.3 F (36.8 C) (07/21 0432) Pulse Rate:  [72-122] 91 (07/21 0432) Resp:  [7-20] 20 (07/21 0432) BP: (101-144)/(75-104) 101/75 (07/21 0432) SpO2:  [98 %-100 %] 98 % (07/21 0432) Arterial Line BP: (173-178)/(79-84) 178/79 (07/20 1515) Weight:  [81.6 kg] 81.6 kg (07/20 1723) Last BM Date: 07/07/21  Intake/Output from previous day: 07/20 0701 - 07/21 0700 In: 4621.2 [I.V.:3851.2; IV Piggyback:500] Out: 2235 [VJKQA:0601; Drains:240; Blood:500] Intake/Output this shift: No intake/output data recorded.  EXAM: NAD Non-labored breathing on RA SNTND, Port sites / extractoin sites c/d/I. JP serosanguinous and non-foul Foley with dark urine (old blood) that is thin / no clots SCD's in place.    Lab Results:  Recent Labs    07/07/21 1515 07/08/21 0509  HGB 9.0* 8.1*  HCT 29.7* 26.8*   BMET Recent Labs    07/08/21 0509  NA 136  K 4.9  CL 103  CO2 21*  GLUCOSE 187*  BUN 23  CREATININE 2.10*  CALCIUM 8.1*   PT/INR No results for input(s): LABPROT, INR in the last 72 hours. ABG No results for input(s): PHART, HCO3 in the last 72 hours.  Invalid input(s): PCO2, PO2  Studies/Results: No results found.  Anti-infectives: Anti-infectives (From admission, onward)    Start     Dose/Rate Route Frequency Ordered Stop   07/07/21 0735  sodium chloride 0.9 % with ceFAZolin (ANCEF) ADS Med       Note to Pharmacy: Randa Evens  : cabinet override      07/07/21 0735 07/07/21  1617   07/07/21 0000  sulfamethoxazole-trimethoprim (BACTRIM DS) 800-160 MG tablet        1 tablet Oral 2 times daily 07/07/21 1433         Assessment/Plan:  Continue JP and foley, Continue IVF to maintain good hydration to solitary kidney in setting of recent partial nephrectomy. Adv to reg diet. H/H tomorrow. Consider gentle RBC if Hgb <8 given solitary kidney.   Alexis Frock 07/08/2021

## 2021-07-08 NOTE — Progress Notes (Signed)
Attempted to ambulate patient and pt c/o feeling dizzy & became diaphoretic.  VS obtained. BP 146/90, HR 124.  Assisted pt to chair. Will continue to monitor. Demitris Pokorny, Laurel Dimmer, RN

## 2021-07-08 NOTE — Progress Notes (Signed)
Upon attempting to ambulate patient, patient became "lightheaded", dizzy, and diaphoretic.  Obtained BP and pt was found to be orthostatic, with BP of 88/56.  Charge nurse, T.Jleigh Striplin,RN, to bedside to assess pt. Dr. Tresa Moore notified and orders received for 250cc NS bolus. Will continue VS every 4 hours, and will continue to monitor patient. Starlynn Klinkner, Laurel Dimmer, RN

## 2021-07-09 LAB — HEMOGLOBIN AND HEMATOCRIT, BLOOD
HCT: 22.5 % — ABNORMAL LOW (ref 39.0–52.0)
HCT: 27.6 % — ABNORMAL LOW (ref 39.0–52.0)
Hemoglobin: 7 g/dL — ABNORMAL LOW (ref 13.0–17.0)
Hemoglobin: 8.8 g/dL — ABNORMAL LOW (ref 13.0–17.0)

## 2021-07-09 LAB — BASIC METABOLIC PANEL
Anion gap: 4 — ABNORMAL LOW (ref 5–15)
BUN: 21 mg/dL (ref 8–23)
CO2: 22 mmol/L (ref 22–32)
Calcium: 7.5 mg/dL — ABNORMAL LOW (ref 8.9–10.3)
Chloride: 109 mmol/L (ref 98–111)
Creatinine, Ser: 2.11 mg/dL — ABNORMAL HIGH (ref 0.61–1.24)
GFR, Estimated: 32 mL/min — ABNORMAL LOW (ref >60)
Glucose, Bld: 145 mg/dL — ABNORMAL HIGH (ref 70–99)
Potassium: 4.6 mmol/L (ref 3.5–5.1)
Sodium: 135 mmol/L (ref 135–145)

## 2021-07-09 LAB — CREATININE, FLUID (PLEURAL, PERITONEAL, JP DRAINAGE): Creat, Fluid: 2 mg/dL

## 2021-07-09 LAB — SURGICAL PATHOLOGY

## 2021-07-09 LAB — PREPARE RBC (CROSSMATCH)

## 2021-07-09 MED ORDER — SODIUM CHLORIDE 0.9% IV SOLUTION: Freq: Once | INTRAVENOUS | Status: AC

## 2021-07-09 NOTE — Progress Notes (Signed)
RN in to hang first unit of PRBCs per orders. NT cleaning Pt up bloody drainage noted from around foley catheter. Pt stood up at bedside and became diaphoretic and pt with c/o being dizzy. BP 121/89 Pulse 107. Pt laid down in bed. Will continue to monitor Pt closely

## 2021-07-09 NOTE — Progress Notes (Signed)
2 units of PRBCs have been infused. Pt able to stand and walk to the chair. Pt tolerated activity better Pt without c/o being dizzy or diaphoretic. Maintain current plan of care

## 2021-07-09 NOTE — Plan of Care (Signed)
  Problem: Clinical Measurements: Goal: Will remain free from infection Outcome: Progressing Goal: Diagnostic test results will improve Outcome: Progressing   Problem: Pain Managment: Goal: General experience of comfort will improve Outcome: Progressing   Problem: Urinary Elimination: Goal: Ability to avoid or minimize complications of infection will improve Outcome: Progressing Goal: Ability to achieve and maintain urine output will improve Outcome: Progressing

## 2021-07-09 NOTE — Progress Notes (Signed)
2 Days Post-Op   Subjective/Chief Complaint: 1 - Left Renal Mass, Massive Prostate With Urinary Retention- s/p robotic LEFT partial nephrectomy and simple prostatectomy on 07/07/2021 for enlargeing left renal mass and massive prostate with refracotry retention and bleeding. Pre-op Hgb 10.7, 8.1 POD 1.   2 - Acute on Chronic Anemia - Hgb 7.0 down form pre-op of 7 POD 2. Some intra-op blood loss, and some slight (improving) post-op. HF low 100s and some slight orthostasis ==> 2 units RBC planned for POD 2.   Today "Avish" is stable.HR 100 and some slight orthostasis, but ambulatory. Hgb 7.0 down from pre-op of 10.    Objective: Vital signs in last 24 hours: Temp:  [97.5 F (36.4 C)-99.7 F (37.6 C)] 99.5 F (37.5 C) (07/22 0534) Pulse Rate:  [95-124] 102 (07/22 0534) Resp:  [16-22] 20 (07/22 0534) BP: (88-146)/(56-91) 115/77 (07/22 0534) SpO2:  [97 %-100 %] 100 % (07/22 0534) Last BM Date: 07/07/21 (prior to surgery)  Intake/Output from previous day: 07/21 0701 - 07/22 0700 In: 1668.9 [P.O.:240; I.V.:1178.9; IV Piggyback:250] Out: [id number] [Urine:3475; Drains:55] Intake/Output this shift: No intake/output data recorded.   EXAM: NAD Non-labored breathing on RA SNTND, Port sites / extractoin sites c/d/I. JP serosanguinous and non-foul Foley with dark urine (old blood) that is thin / no clots SCD's in place.   Lab Results:  Recent Labs    07/08/21 0509 07/09/21 0424  HGB 8.1* 7.0*  HCT 26.8* 22.5*   BMET Recent Labs    07/08/21 0509 07/09/21 0424  NA 136 135  K 4.9 4.6  CL 103 109  CO2 21* 22  GLUCOSE 187* 145*  BUN 23 21  CREATININE 2.10* 2.11*  CALCIUM 8.1* 7.5*   PT/INR No results for input(s): LABPROT, INR in the last 72 hours. ABG No results for input(s): PHART, HCO3 in the last 72 hours.  Invalid input(s): PCO2, PO2  Studies/Results: No results found.  Anti-infectives: Anti-infectives (From admission, onward)    Start     Dose/Rate Route Frequency  Ordered Stop   07/07/21 0735  sodium chloride 0.9 % with ceFAZolin (ANCEF) ADS Med       Note to Pharmacy: Randa Evens  : cabinet override      07/07/21 0735 07/07/21 1617   07/07/21 0000  sulfamethoxazole-trimethoprim (BACTRIM DS) 800-160 MG tablet        1 tablet Oral 2 times daily 07/07/21 1433         Assessment/Plan:  1 - Left Renal Mass, Massive Prostate With Urinary Retention- Doing well POD2. Continue current foley and JP for now. Check JP Cr.   2 - Acute on Chronic Anemia - 2 upRBC today as some symptoatic anemia now, I feel 2 units indicated as he will likley have some small continued loss before levels out.  Remain in house, likely DC AM tomorrow v. Late afternoon today pending clnical status.   Alexis Frock 07/09/2021

## 2021-07-10 LAB — TYPE AND SCREEN
ABO/RH(D): B POS
Antibody Screen: NEGATIVE
Unit division: 0
Unit division: 0

## 2021-07-10 LAB — HEMOGLOBIN AND HEMATOCRIT, BLOOD
HCT: 27 % — ABNORMAL LOW (ref 39.0–52.0)
Hemoglobin: 8.7 g/dL — ABNORMAL LOW (ref 13.0–17.0)

## 2021-07-10 LAB — BPAM RBC
Blood Product Expiration Date: 202208102359
Blood Product Expiration Date: 202208102359
ISSUE DATE / TIME: 202207220954
ISSUE DATE / TIME: 202207221308
Unit Type and Rh: 7300
Unit Type and Rh: 7300

## 2021-07-10 LAB — GLUCOSE, CAPILLARY: Glucose-Capillary: 121 mg/dL — ABNORMAL HIGH (ref 70–99)

## 2021-07-10 MED ORDER — SODIUM CHLORIDE 0.9 % IV SOLN
2.0000 g | INTRAVENOUS | Status: DC
  Administered 2021-07-10: 2 g via INTRAVENOUS
  Filled 2021-07-10: qty 20
  Filled 2021-07-10: qty 2

## 2021-07-10 NOTE — Plan of Care (Signed)
  Problem: Clinical Measurements: Goal: Diagnostic test results will improve Outcome: Progressing   Problem: Pain Managment: Goal: General experience of comfort will improve Outcome: Progressing   Problem: Safety: Goal: Ability to remain free from injury will improve Outcome: Progressing   Problem: Nutrition: Goal: Adequate nutrition will be maintained Outcome: Adequate for Discharge

## 2021-07-10 NOTE — Progress Notes (Addendum)
3 Days Post-Op   Subjective/Chief Complaint: 1 - Left Renal Mass, Massive Prostate With Urinary Retention- s/p robotic LEFT partial nephrectomy and simple prostatectomy on 07/07/2021 for enlargeing left renal mass and massive prostate with refracotry retention and bleeding. Pre-op Hgb 10.7, 8.1 POD 1.  7 on postop day 2.    2 - Acute on Chronic Anemia - Hgb 7.0 down form pre-op of 7 POD 2. Some intra-op blood loss, and some slight (improving) post-op. HF low 100s and some slight orthostasis ==> 2 units RBC planned for POD 2.   Today "Marcus Hatfield" is stable.  He did have a T-max of 38.1 at midnight.  Defervesced without intervention.  Received 2 units of blood yesterday for hemoglobin of 7.  JP creatinine was 2 yesterday.  His serum creatinine is 2.1.   Objective: Vital signs in last 24 hours: Temp:  [98.2 F (36.8 C)-100.6 F (38.1 C)] 98.8 F (37.1 C) (07/23 0600) Pulse Rate:  [87-107] 87 (07/23 0600) Resp:  [18-20] 18 (07/23 0600) BP: (117-131)/(75-89) 117/75 (07/23 0600) SpO2:  [98 %-100 %] 100 % (07/23 0600) Last BM Date: 07/08/21  Intake/Output from previous day: 07/22 0701 - 07/23 0700 In: 3418 [P.O.:120; I.V.:2544.3; Blood:753.8] Out: [id number] [Urine:1625; Drains:20] Intake/Output this shift: No intake/output data recorded.   EXAM: NAD Non-labored breathing on RA SNTND, Port sites / extractoin sites c/d/I. JP serosanguinous and non-foul Foley with dark urine (old blood) that is thin / no clots SCD's in place.   Lab Results:  Recent Labs    07/09/21 0424 07/09/21 2018  HGB 7.0* 8.8*  HCT 22.5* 27.6*    BMET Recent Labs    07/08/21 0509 07/09/21 0424  NA 136 135  K 4.9 4.6  CL 103 109  CO2 21* 22  GLUCOSE 187* 145*  BUN 23 21  CREATININE 2.10* 2.11*  CALCIUM 8.1* 7.5*    PT/INR No results for input(s): LABPROT, INR in the last 72 hours. ABG No results for input(s): PHART, HCO3 in the last 72 hours.  Invalid input(s): PCO2, PO2  Studies/Results: No  results found.  Anti-infectives: Anti-infectives (From admission, onward)    Start     Dose/Rate Route Frequency Ordered Stop   07/07/21 0735  sodium chloride 0.9 % with ceFAZolin (ANCEF) ADS Med       Note to Pharmacy: Randa Evens  : cabinet override      07/07/21 0735 07/07/21 1617   07/07/21 0000  sulfamethoxazole-trimethoprim (BACTRIM DS) 800-160 MG tablet        1 tablet Oral 2 times daily 07/07/21 1433         Assessment/Plan:  1 - Left Renal Mass, Massive Prostate With Urinary Retention- Doing well POD2. Continue Foley catheter. -Discontinue JP drain  -We will send a urine culture today in the setting of low-grade fever overnight.  We will start empiric ceftriaxone.  2 - Acute on Chronic Anemia -  Received 2 units of blood yesterday.  Hemoglobin today stable.  Disposition: If remains afebrile, will consider sending him home tomorrow on empiric antibiotics and follow-up as urine culture.  Marcus Hatfield 07/10/2021 I agree with the above note

## 2021-07-11 LAB — URINE CULTURE: Culture: NO GROWTH

## 2021-07-11 MED ORDER — CEFDINIR 300 MG PO CAPS: 300.0000 mg | ORAL_CAPSULE | Freq: Two times a day (BID) | ORAL | 0 refills | Status: AC

## 2021-07-11 NOTE — Progress Notes (Addendum)
Patient remains stable, alert, oriented(x4) ambulatory without assistance. Discharge instructions reviewed with the pt and spouse. Foley/leg bag and incision care instruction provided. Per on-call urologist prescriptions were called into the [facility] pharmacy listed on instructions. Foley remains patent to gravity. Questions, concerns were denied at this time.

## 2021-07-11 NOTE — Discharge Summary (Signed)
[facility] Discharge Summary  Admit date: 07/07/2021  Discharge date and time: 07/11/21   Discharge to: Home  Discharge Service: Urology  Discharge Attending Physician: Franchot Gallo MD  Discharge  Diagnoses: Left renal mass, benign prostatic hyperplasia  Secondary Diagnosis: Active Problems:   Renal mass   OR Procedures: Procedure(s): XI ROBOTIC ASSITED PARTIAL NEPHRECTOMY XI ROBOTIC ASSISTED SIMPLE PROSTATECTOMY 07/07/2021   Ancillary Procedures: None   Discharge Day Services: The patient was seen and examined by the Urology team both in the morning and immediately prior to discharge.  Vital signs and laboratory values were stable and within normal limits.  The physical exam was benign and unchanged and all surgical wounds were examined.  Discharge instructions were explained and all questions answered.  Subjective  No acute events overnight. Pain Controlled. No fever or chills.  Objective Patient Vitals for the past 8 hrs:  BP Temp Temp src Pulse Resp SpO2  07/11/21 0530 134/83 98.3 F (36.8 C) Oral 78 16 100 %   No intake/output data recorded.  General Appearance:        No acute distress Lungs:                       Normal work of breathing on room air Heart:                                Regular rate and rhythm Abdomen:                         Soft, non-tender, non-distended.  Foley catheter in place with clear pink urine output Extremities:                      Warm and well perfused   Hospital Course:  The patient underwent robotic left partial nephrectomy and simple prostatectomy on 07/07/2021.  The patient tolerated the procedure well, was extubated in the OR, and afterwards was taken to the PACU for routine post-surgical care. When stable the patient was transferred to the floor.     His hemoglobin had down trended to 7 on postop day 2.  He received 2 units of packed red blood cells and his hemoglobin responded appropriately.  He remained  hemodynamically stable.  Of note he did develop a temperature max of 38.1 on postop day 2.  A urine culture was collected and he was started on ceftriaxone.  He remained afebrile afterwards.  JP drain was removed on postop day 2 after a JP creatinine was sent and it was equal to serum creatinine.  The patient's diet was slowly advanced and at the time of discharge was tolerating a regular diet.  The patient was discharged home 4 Days Post-Op, at which point was tolerating a regular solid diet,  have adequate pain control with P.O. pain medication, and could ambulate without difficulty. The patient will follow up with [place] for post op check.  He has appointment with Dr. Tresa Moore on 07/19/2021 for Foley catheter removal.  He will remain on cefdinir until then.  I will follow-up his urine culture and ensure adequate coverage.  Condition at Discharge: Improved  Discharge Medications:  Allergies as of 07/11/2021       Reactions   Penicillins Hives   Has patient had a PCN reaction causing immediate rash, facial/tongue/throat swelling, SOB or lightheadedness with hypotension:Yes Has patient had a PCN  reaction causing severe rash involving mucus membranes or skin necrosis: Yes Has patient had a PCN reaction that required hospitalization No Has patient had a PCN reaction occurring within the last 10 years: No If all of the above answers are "NO", then may proceed with Cephalosporin use.   Latex Rash        Medication List     STOP taking these medications    cholecalciferol 25 MCG (1000 UNIT) tablet Commonly known as: VITAMIN D3   multivitamin with minerals tablet   vitamin C 1000 MG tablet       TAKE these medications    cefdinir 300 MG capsule Commonly known as: OMNICEF Take 1 capsule (300 mg total) by mouth 2 (two) times daily for 8 days.   docusate sodium 100 MG capsule Commonly known as: COLACE Take 1 capsule (100 mg total) by mouth 2 (two) times daily.   ferrous sulfate 325 (65 FE)  MG tablet Take 1 tablet (325 mg total) by mouth 2 (two) times daily with a meal.   finasteride 5 MG tablet Commonly known as: PROSCAR Take 1 tablet (5 mg total) by mouth daily.   fluticasone 50 MCG/ACT nasal spray Commonly known as: FLONASE Place 2 sprays into both nostrils daily as needed for allergies.   HYDROcodone-acetaminophen 5-325 MG tablet Commonly known as: Norco Take 1-2 tablets by mouth every 6 (six) hours as needed for moderate pain.   lactulose 10 GM/15ML solution Commonly known as: CHRONULAC Take 10 g by mouth daily as needed for constipation.   sulfamethoxazole-trimethoprim 800-160 MG tablet Commonly known as: BACTRIM DS Take 1 tablet by mouth 2 (two) times daily. Start the day prior to foley removal appointment   tamsulosin 0.4 MG Caps capsule Commonly known as: FLOMAX Take 1 capsule (0.4 mg total) by mouth daily after supper. What changed: when to take this

## 2021-09-10 DIAGNOSIS — C9000 Multiple myeloma not having achieved remission: Secondary | ICD-10-CM | POA: Diagnosis not present

## 2021-09-14 DIAGNOSIS — C9000 Multiple myeloma not having achieved remission: Secondary | ICD-10-CM | POA: Diagnosis not present

## 2021-09-14 DIAGNOSIS — C9002 Multiple myeloma in relapse: Secondary | ICD-10-CM | POA: Diagnosis not present

## 2021-09-14 DIAGNOSIS — C642 Malignant neoplasm of left kidney, except renal pelvis: Secondary | ICD-10-CM | POA: Diagnosis not present

## 2021-09-14 DIAGNOSIS — N289 Disorder of kidney and ureter, unspecified: Secondary | ICD-10-CM | POA: Diagnosis not present

## 2021-09-14 DIAGNOSIS — M899 Disorder of bone, unspecified: Secondary | ICD-10-CM | POA: Diagnosis not present

## 2021-09-14 DIAGNOSIS — R972 Elevated prostate specific antigen [PSA]: Secondary | ICD-10-CM | POA: Diagnosis not present

## 2021-09-14 DIAGNOSIS — M8589 Other specified disorders of bone density and structure, multiple sites: Secondary | ICD-10-CM | POA: Diagnosis not present

## 2021-10-05 DIAGNOSIS — E782 Mixed hyperlipidemia: Secondary | ICD-10-CM | POA: Diagnosis not present

## 2021-10-05 DIAGNOSIS — Z125 Encounter for screening for malignant neoplasm of prostate: Secondary | ICD-10-CM | POA: Diagnosis not present

## 2021-10-05 DIAGNOSIS — I10 Essential (primary) hypertension: Secondary | ICD-10-CM | POA: Diagnosis not present

## 2021-10-05 DIAGNOSIS — M859 Disorder of bone density and structure, unspecified: Secondary | ICD-10-CM | POA: Diagnosis not present

## 2021-10-12 DIAGNOSIS — R972 Elevated prostate specific antigen [PSA]: Secondary | ICD-10-CM | POA: Diagnosis not present

## 2021-10-12 DIAGNOSIS — N1831 Chronic kidney disease, stage 3a: Secondary | ICD-10-CM | POA: Diagnosis not present

## 2021-10-12 DIAGNOSIS — C641 Malignant neoplasm of right kidney, except renal pelvis: Secondary | ICD-10-CM | POA: Diagnosis not present

## 2021-10-12 DIAGNOSIS — Z0000 Encounter for general adult medical examination without abnormal findings: Secondary | ICD-10-CM | POA: Diagnosis not present

## 2021-10-12 DIAGNOSIS — R82998 Other abnormal findings in urine: Secondary | ICD-10-CM | POA: Diagnosis not present

## 2021-10-12 DIAGNOSIS — E782 Mixed hyperlipidemia: Secondary | ICD-10-CM | POA: Diagnosis not present

## 2021-10-12 DIAGNOSIS — M1990 Unspecified osteoarthritis, unspecified site: Secondary | ICD-10-CM | POA: Diagnosis not present

## 2021-10-12 DIAGNOSIS — I10 Essential (primary) hypertension: Secondary | ICD-10-CM | POA: Diagnosis not present

## 2021-10-12 DIAGNOSIS — C9000 Multiple myeloma not having achieved remission: Secondary | ICD-10-CM | POA: Diagnosis not present

## 2021-10-12 DIAGNOSIS — D631 Anemia in chronic kidney disease: Secondary | ICD-10-CM | POA: Diagnosis not present

## 2021-10-13 DIAGNOSIS — Z1212 Encounter for screening for malignant neoplasm of rectum: Secondary | ICD-10-CM | POA: Diagnosis not present

## 2021-10-26 DIAGNOSIS — C642 Malignant neoplasm of left kidney, except renal pelvis: Secondary | ICD-10-CM | POA: Diagnosis not present

## 2021-10-26 DIAGNOSIS — N138 Other obstructive and reflux uropathy: Secondary | ICD-10-CM | POA: Diagnosis not present

## 2021-12-15 DIAGNOSIS — R059 Cough, unspecified: Secondary | ICD-10-CM | POA: Diagnosis not present

## 2021-12-15 DIAGNOSIS — R0981 Nasal congestion: Secondary | ICD-10-CM | POA: Diagnosis not present

## 2021-12-15 DIAGNOSIS — Z1152 Encounter for screening for COVID-19: Secondary | ICD-10-CM | POA: Diagnosis not present

## 2021-12-15 DIAGNOSIS — R5383 Other fatigue: Secondary | ICD-10-CM | POA: Diagnosis not present

## 2021-12-15 DIAGNOSIS — J029 Acute pharyngitis, unspecified: Secondary | ICD-10-CM | POA: Diagnosis not present

## 2021-12-15 DIAGNOSIS — C9000 Multiple myeloma not having achieved remission: Secondary | ICD-10-CM | POA: Diagnosis not present

## 2021-12-15 DIAGNOSIS — I10 Essential (primary) hypertension: Secondary | ICD-10-CM | POA: Diagnosis not present

## 2021-12-15 DIAGNOSIS — U071 COVID-19: Secondary | ICD-10-CM | POA: Diagnosis not present

## 2022-01-07 DIAGNOSIS — C9000 Multiple myeloma not having achieved remission: Secondary | ICD-10-CM | POA: Diagnosis not present

## 2022-01-11 DIAGNOSIS — N289 Disorder of kidney and ureter, unspecified: Secondary | ICD-10-CM | POA: Diagnosis not present

## 2022-01-11 DIAGNOSIS — N209 Urinary calculus, unspecified: Secondary | ICD-10-CM | POA: Diagnosis not present

## 2022-01-11 DIAGNOSIS — Z79899 Other long term (current) drug therapy: Secondary | ICD-10-CM | POA: Diagnosis not present

## 2022-01-11 DIAGNOSIS — C9002 Multiple myeloma in relapse: Secondary | ICD-10-CM | POA: Diagnosis not present

## 2022-01-11 DIAGNOSIS — Z7983 Long term (current) use of bisphosphonates: Secondary | ICD-10-CM | POA: Diagnosis not present

## 2022-01-11 DIAGNOSIS — Z85528 Personal history of other malignant neoplasm of kidney: Secondary | ICD-10-CM | POA: Diagnosis not present

## 2022-01-11 DIAGNOSIS — R972 Elevated prostate specific antigen [PSA]: Secondary | ICD-10-CM | POA: Diagnosis not present

## 2022-02-24 DIAGNOSIS — R972 Elevated prostate specific antigen [PSA]: Secondary | ICD-10-CM | POA: Diagnosis not present

## 2022-02-24 DIAGNOSIS — C9000 Multiple myeloma not having achieved remission: Secondary | ICD-10-CM | POA: Diagnosis not present

## 2022-02-24 DIAGNOSIS — I129 Hypertensive chronic kidney disease with stage 1 through stage 4 chronic kidney disease, or unspecified chronic kidney disease: Secondary | ICD-10-CM | POA: Diagnosis not present

## 2022-02-24 DIAGNOSIS — M1990 Unspecified osteoarthritis, unspecified site: Secondary | ICD-10-CM | POA: Diagnosis not present

## 2022-02-24 DIAGNOSIS — E782 Mixed hyperlipidemia: Secondary | ICD-10-CM | POA: Diagnosis not present

## 2022-02-24 DIAGNOSIS — N1831 Chronic kidney disease, stage 3a: Secondary | ICD-10-CM | POA: Diagnosis not present

## 2022-02-24 DIAGNOSIS — D631 Anemia in chronic kidney disease: Secondary | ICD-10-CM | POA: Diagnosis not present

## 2022-02-24 DIAGNOSIS — C641 Malignant neoplasm of right kidney, except renal pelvis: Secondary | ICD-10-CM | POA: Diagnosis not present

## 2022-04-19 ENCOUNTER — Ambulatory Visit (HOSPITAL_COMMUNITY)
Admission: RE | Admit: 2022-04-19 | Discharge: 2022-04-19 | Disposition: A | Discharge status: Home / Self Care | Payer: Medicare HMO | Source: Ambulatory Visit | Attending: Urology | Admitting: Urology

## 2022-04-19 ENCOUNTER — Other Ambulatory Visit (HOSPITAL_COMMUNITY): Payer: Self-pay | Admitting: Urology

## 2022-04-19 DIAGNOSIS — C642 Malignant neoplasm of left kidney, except renal pelvis: Secondary | ICD-10-CM

## 2022-04-19 DIAGNOSIS — J90 Pleural effusion, not elsewhere classified: Secondary | ICD-10-CM | POA: Diagnosis not present

## 2022-04-19 DIAGNOSIS — R109 Unspecified abdominal pain: Secondary | ICD-10-CM | POA: Diagnosis not present

## 2022-04-26 DIAGNOSIS — N138 Other obstructive and reflux uropathy: Secondary | ICD-10-CM | POA: Diagnosis not present

## 2022-04-26 DIAGNOSIS — C641 Malignant neoplasm of right kidney, except renal pelvis: Secondary | ICD-10-CM | POA: Diagnosis not present

## 2022-04-26 DIAGNOSIS — C642 Malignant neoplasm of left kidney, except renal pelvis: Secondary | ICD-10-CM | POA: Diagnosis not present

## 2022-04-26 DIAGNOSIS — N5201 Erectile dysfunction due to arterial insufficiency: Secondary | ICD-10-CM | POA: Diagnosis not present

## 2022-05-02 DIAGNOSIS — C9002 Multiple myeloma in relapse: Secondary | ICD-10-CM | POA: Diagnosis not present

## 2022-05-05 DIAGNOSIS — M8580 Other specified disorders of bone density and structure, unspecified site: Secondary | ICD-10-CM | POA: Diagnosis not present

## 2022-05-05 DIAGNOSIS — C9000 Multiple myeloma not having achieved remission: Secondary | ICD-10-CM | POA: Diagnosis not present

## 2022-05-05 DIAGNOSIS — N2889 Other specified disorders of kidney and ureter: Secondary | ICD-10-CM | POA: Diagnosis not present

## 2022-05-05 DIAGNOSIS — N189 Chronic kidney disease, unspecified: Secondary | ICD-10-CM | POA: Diagnosis not present

## 2022-05-05 DIAGNOSIS — C642 Malignant neoplasm of left kidney, except renal pelvis: Secondary | ICD-10-CM | POA: Diagnosis not present

## 2022-05-05 DIAGNOSIS — R972 Elevated prostate specific antigen [PSA]: Secondary | ICD-10-CM | POA: Diagnosis not present

## 2022-05-05 DIAGNOSIS — C9002 Multiple myeloma in relapse: Secondary | ICD-10-CM | POA: Diagnosis not present

## 2022-05-05 DIAGNOSIS — Z85528 Personal history of other malignant neoplasm of kidney: Secondary | ICD-10-CM | POA: Diagnosis not present

## 2022-05-05 DIAGNOSIS — Z905 Acquired absence of kidney: Secondary | ICD-10-CM | POA: Diagnosis not present

## 2022-05-19 DIAGNOSIS — D3132 Benign neoplasm of left choroid: Secondary | ICD-10-CM | POA: Diagnosis not present

## 2022-05-19 DIAGNOSIS — H52203 Unspecified astigmatism, bilateral: Secondary | ICD-10-CM | POA: Diagnosis not present

## 2022-05-19 DIAGNOSIS — Z961 Presence of intraocular lens: Secondary | ICD-10-CM | POA: Diagnosis not present

## 2022-07-13 IMAGING — CT CT HEAD W/O CM
3 series · 15 of 47 positions shown, 18 images · non-contrast
Comparison: None.

CLINICAL DATA: Facial trauma.

EXAM:
CT HEAD WITHOUT CONTRAST
CT MAXILLOFACIAL WITHOUT CONTRAST
TECHNIQUE: Multidetector CT imaging of the head and maxillofacial structures
were performed using the standard protocol without intravenous
contrast. Multiplanar CT image reconstructions of the maxillofacial
structures were also generated.

[Series 1: head 5.0 h30s · axial · 0.48mm/px · z∈[-61,+84]mm · 9 of 35 slices shown, 12 images]
[im 3/35  brain]
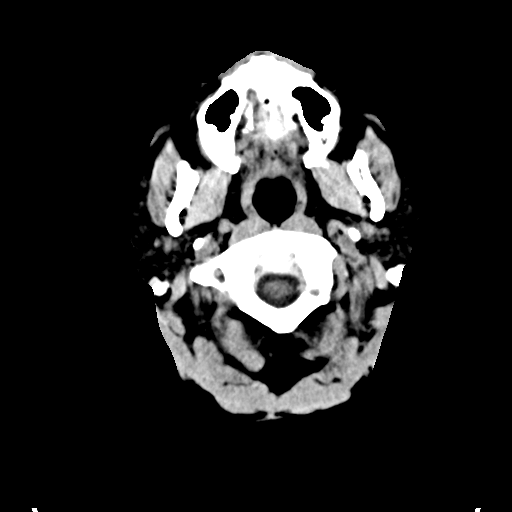
[im 3/35  bone]
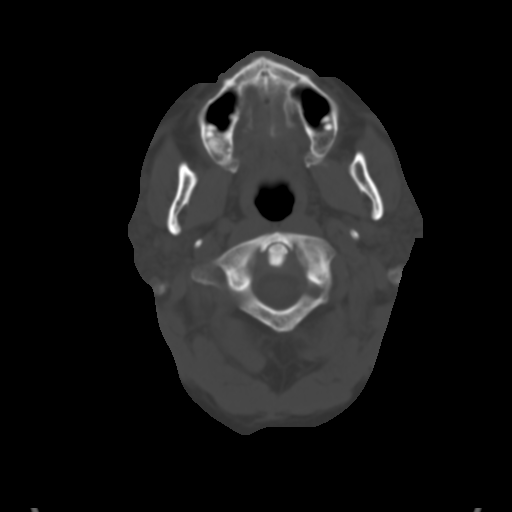
[im 6/35  brain]
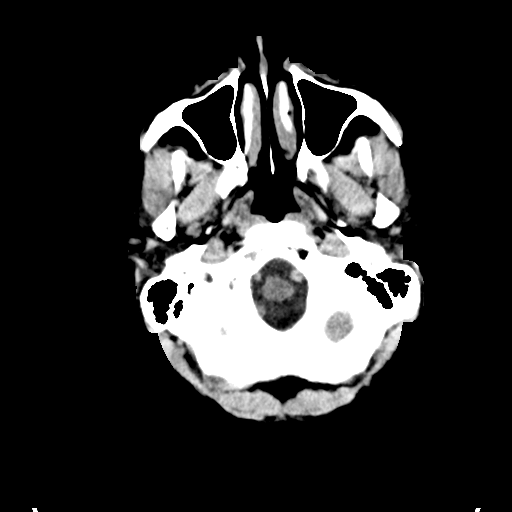
[im 10/35  brain]
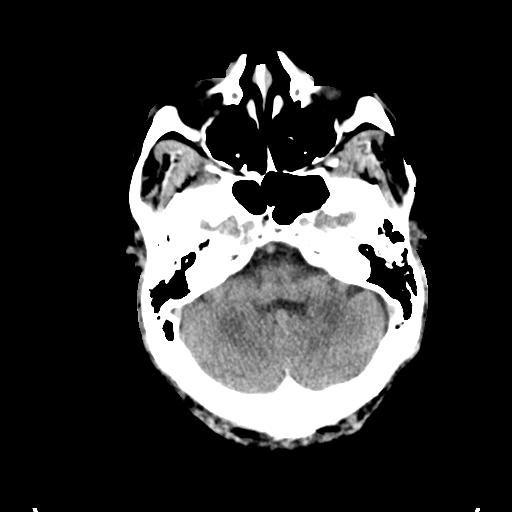
[im 13/35  brain]
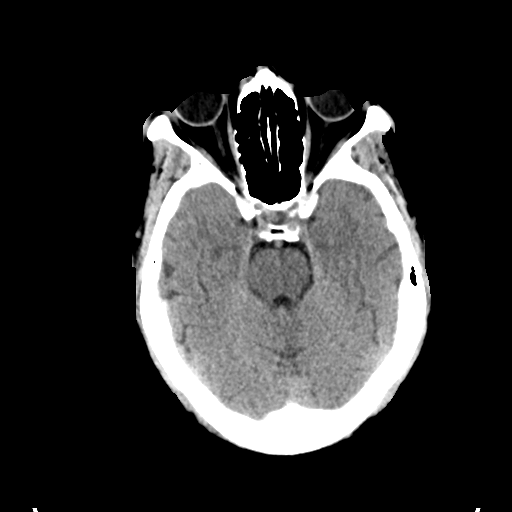
[im 18/35  brain]
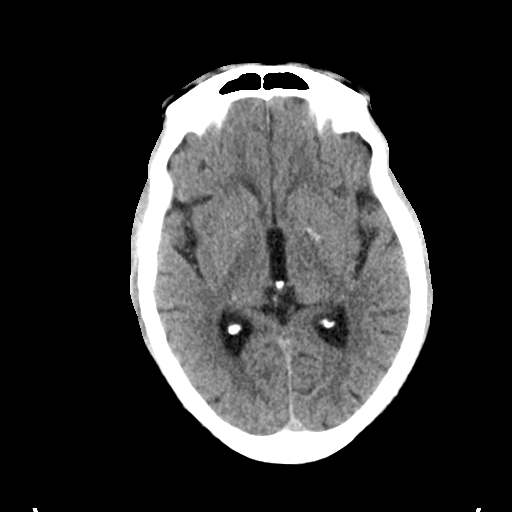
[im 18/35  bone]
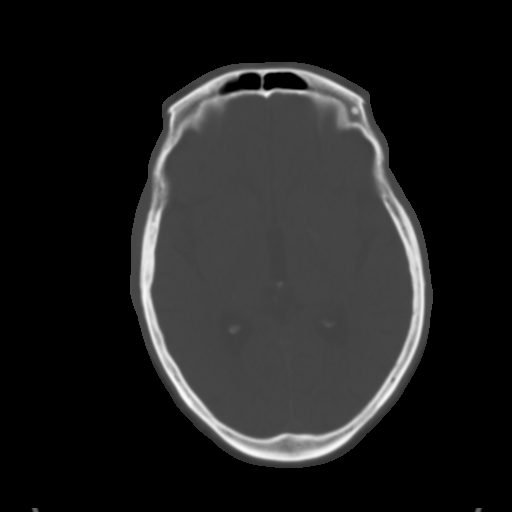
[im 22/35  brain]
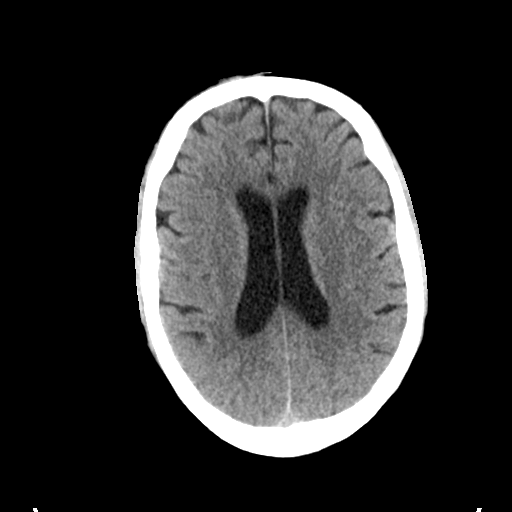
[im 25/35  brain]
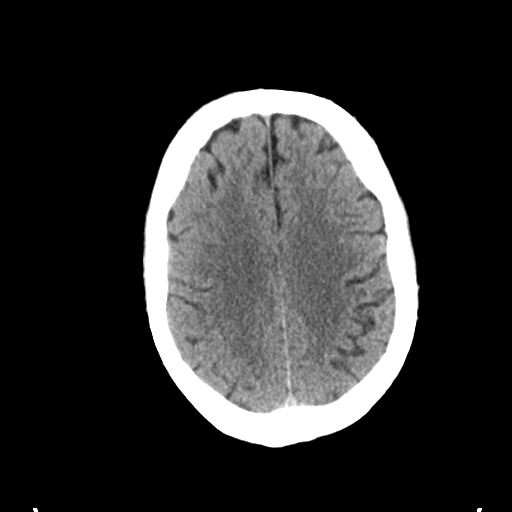
[im 29/35  brain]
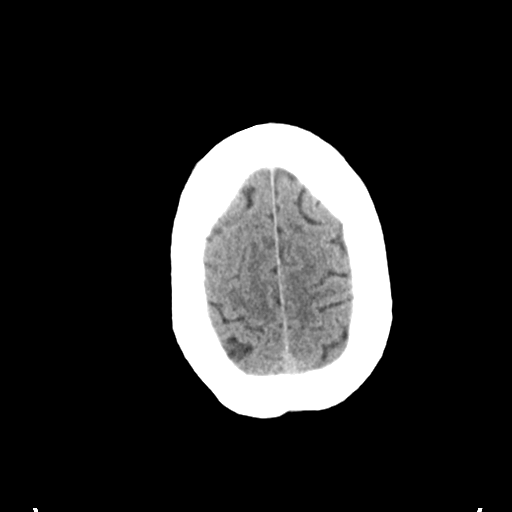
[im 32/35  brain]
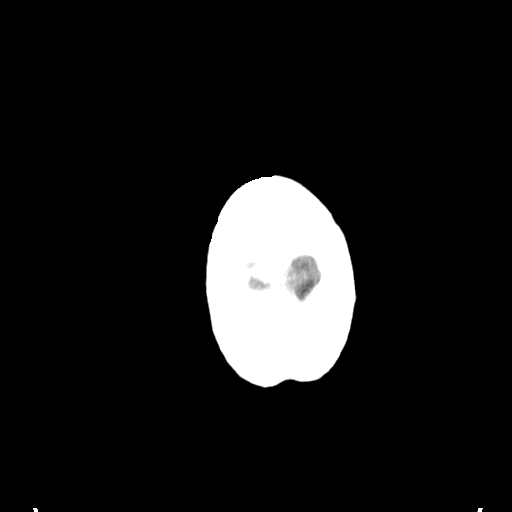
[im 32/35  bone]
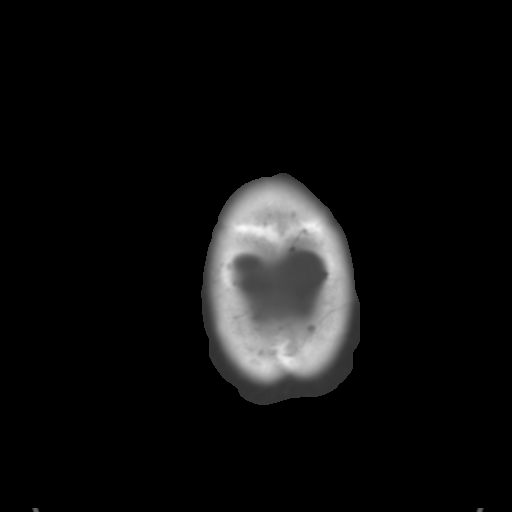

[Series 5: head 3.0 mpr cor · coronal · 0.35mm/px · 3 of 74 slices shown]
[im 25/74  brain]
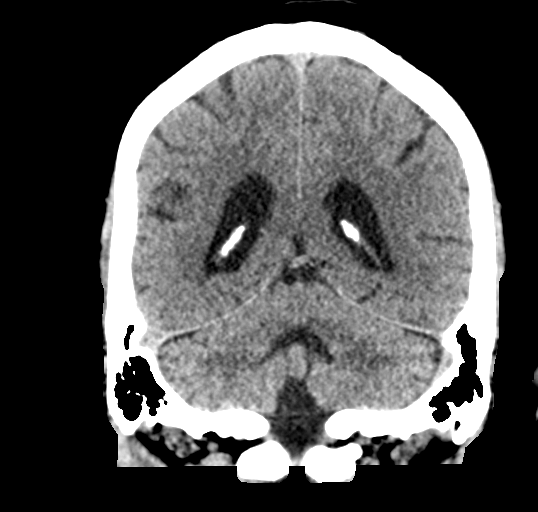
[im 33/74  brain]
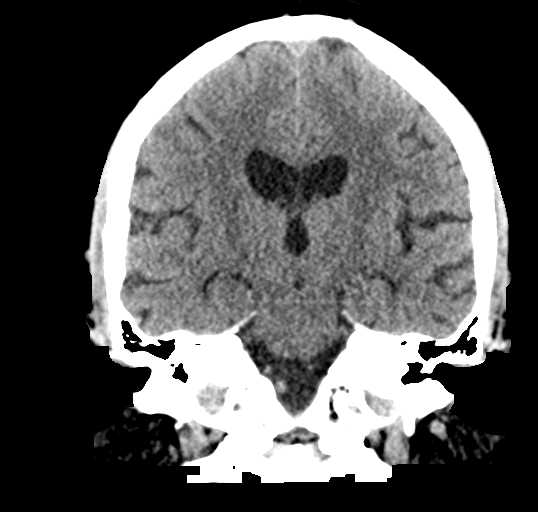
[im 41/74  brain]
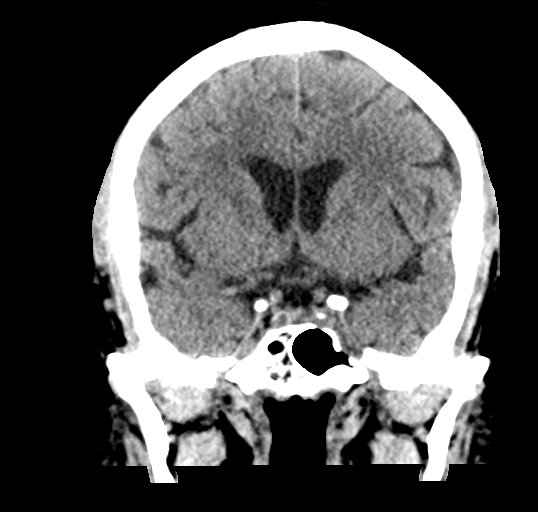

[Series 6: head 3.0 mpr sag · sagittal · 0.35mm/px · 3 of 67 slices shown]
[im 23/67  brain]
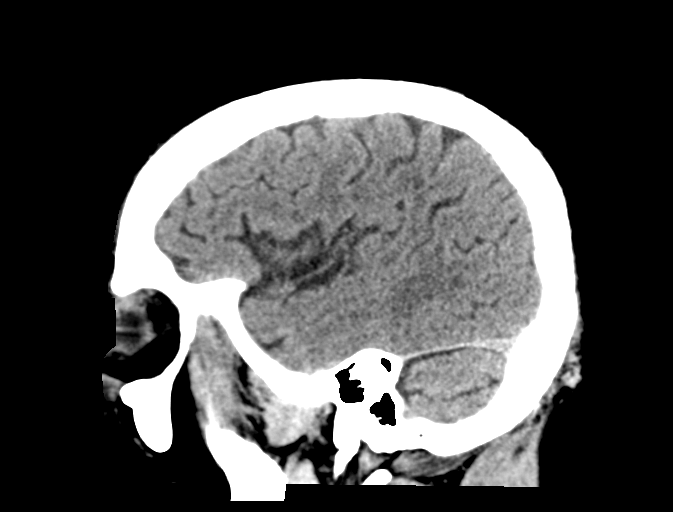
[im 34/67  brain]
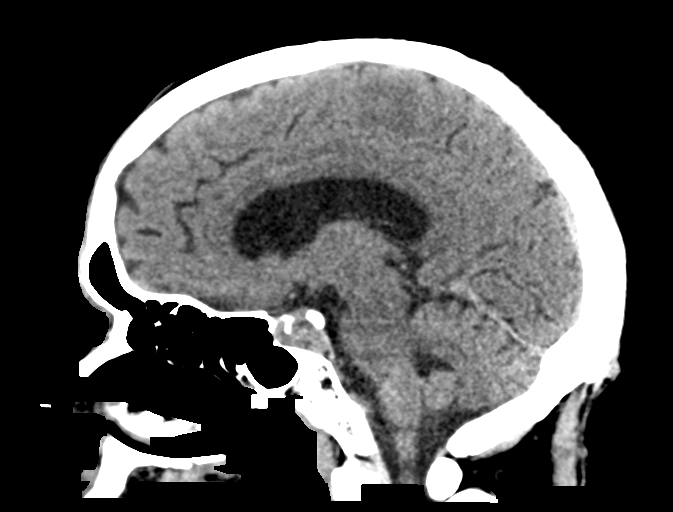
[im 45/67  brain]
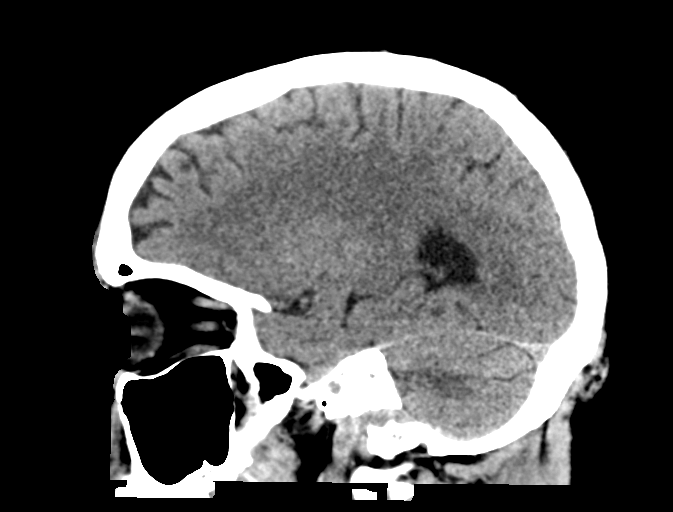

[15 of 47 positions shown; findings below may reference images not displayed]

FINDINGS: CT HEAD FINDINGS

Brain: No evidence of acute infarction, hemorrhage, hydrocephalus,
extra-axial collection or mass lesion/mass effect.

Vascular: No hyperdense vessel identified.

Skull: No acute fracture.

Other: No mastoid effusions.

CT MAXILLOFACIAL FINDINGS

Osseous: No fracture or mandibular dislocation. No destructive
process.

Orbits: Negative. No traumatic or inflammatory finding.

Sinuses: Minimal inferior right maxillary sinus mucosal thickening.
Otherwise, sinuses are clear. No air-fluid levels.

Soft tissues: Right periorbital and pre malar are soft tissue
contusion. Bilateral premalar/cheek superficial hyperdensities, most
likely dermal calcifications but amenable to direct inspection to
exclude foreign bodies. Heterogeneous submandibular and parotid
glands bilaterally without evidence of active inflammation.
IMPRESSION: CT head:

No evidence of acute intracranial abnormality.

Maxillofacial CT:

1. No evidence of acute fracture.
2. Right periorbital and pre malar are soft tissue contusion.
Bilateral premalar/cheek superficial hyperdensities, likely dermal
calcifications but amenable to direct inspection to exclude foreign
bodies.
3. Heterogeneous parotid and submandibular glands bilaterally
without evidence of active inflammation, which is nonspecific but
may relate to chronic inflammation.

## 2022-09-05 DIAGNOSIS — C9002 Multiple myeloma in relapse: Secondary | ICD-10-CM | POA: Diagnosis not present

## 2022-09-08 DIAGNOSIS — C9002 Multiple myeloma in relapse: Secondary | ICD-10-CM | POA: Diagnosis not present

## 2022-09-08 DIAGNOSIS — Z9229 Personal history of other drug therapy: Secondary | ICD-10-CM | POA: Diagnosis not present

## 2022-09-08 DIAGNOSIS — Z85528 Personal history of other malignant neoplasm of kidney: Secondary | ICD-10-CM | POA: Diagnosis not present

## 2022-09-08 DIAGNOSIS — Z8579 Personal history of other malignant neoplasms of lymphoid, hematopoietic and related tissues: Secondary | ICD-10-CM | POA: Diagnosis not present

## 2022-09-08 DIAGNOSIS — C61 Malignant neoplasm of prostate: Secondary | ICD-10-CM | POA: Diagnosis not present

## 2022-09-08 DIAGNOSIS — Z8546 Personal history of malignant neoplasm of prostate: Secondary | ICD-10-CM | POA: Diagnosis not present

## 2022-09-08 DIAGNOSIS — N189 Chronic kidney disease, unspecified: Secondary | ICD-10-CM | POA: Diagnosis not present

## 2022-09-08 DIAGNOSIS — Z9079 Acquired absence of other genital organ(s): Secondary | ICD-10-CM | POA: Diagnosis not present

## 2022-09-08 DIAGNOSIS — Z08 Encounter for follow-up examination after completed treatment for malignant neoplasm: Secondary | ICD-10-CM | POA: Diagnosis not present

## 2022-09-13 DIAGNOSIS — Z23 Encounter for immunization: Secondary | ICD-10-CM | POA: Diagnosis not present

## 2022-09-13 DIAGNOSIS — E782 Mixed hyperlipidemia: Secondary | ICD-10-CM | POA: Diagnosis not present

## 2022-09-13 DIAGNOSIS — N1831 Chronic kidney disease, stage 3a: Secondary | ICD-10-CM | POA: Diagnosis not present

## 2022-09-13 DIAGNOSIS — D631 Anemia in chronic kidney disease: Secondary | ICD-10-CM | POA: Diagnosis not present

## 2022-09-13 DIAGNOSIS — R0789 Other chest pain: Secondary | ICD-10-CM | POA: Diagnosis not present

## 2022-09-13 DIAGNOSIS — I129 Hypertensive chronic kidney disease with stage 1 through stage 4 chronic kidney disease, or unspecified chronic kidney disease: Secondary | ICD-10-CM | POA: Diagnosis not present

## 2022-09-13 DIAGNOSIS — R972 Elevated prostate specific antigen [PSA]: Secondary | ICD-10-CM | POA: Diagnosis not present

## 2022-09-13 DIAGNOSIS — C641 Malignant neoplasm of right kidney, except renal pelvis: Secondary | ICD-10-CM | POA: Diagnosis not present

## 2022-09-13 DIAGNOSIS — C9000 Multiple myeloma not having achieved remission: Secondary | ICD-10-CM | POA: Diagnosis not present

## 2022-09-13 DIAGNOSIS — M1990 Unspecified osteoarthritis, unspecified site: Secondary | ICD-10-CM | POA: Diagnosis not present

## 2022-09-20 ENCOUNTER — Other Ambulatory Visit: Payer: Self-pay | Admitting: Internal Medicine

## 2022-09-20 DIAGNOSIS — R0789 Other chest pain: Secondary | ICD-10-CM

## 2022-09-26 ENCOUNTER — Ambulatory Visit
Admission: RE | Admit: 2022-09-26 | Discharge: 2022-09-26 | Disposition: A | Discharge status: Home / Self Care | Payer: Medicare HMO | Source: Ambulatory Visit | Attending: Internal Medicine | Admitting: Internal Medicine

## 2022-09-26 DIAGNOSIS — S2241XA Multiple fractures of ribs, right side, initial encounter for closed fracture: Secondary | ICD-10-CM | POA: Diagnosis not present

## 2022-09-26 DIAGNOSIS — Z85528 Personal history of other malignant neoplasm of kidney: Secondary | ICD-10-CM | POA: Diagnosis not present

## 2022-09-26 DIAGNOSIS — S22059A Unspecified fracture of T5-T6 vertebra, initial encounter for closed fracture: Secondary | ICD-10-CM | POA: Diagnosis not present

## 2022-09-26 DIAGNOSIS — R0789 Other chest pain: Secondary | ICD-10-CM

## 2022-09-26 DIAGNOSIS — C7951 Secondary malignant neoplasm of bone: Secondary | ICD-10-CM | POA: Diagnosis not present

## 2022-09-26 MED ORDER — IOPAMIDOL (ISOVUE-300) INJECTION 61%
60.0000 mL | Freq: Once | INTRAVENOUS | Status: AC | PRN
  Administered 2022-09-26: 60 mL via INTRAVENOUS

## 2022-10-21 DIAGNOSIS — E782 Mixed hyperlipidemia: Secondary | ICD-10-CM | POA: Diagnosis not present

## 2022-10-21 DIAGNOSIS — Z1212 Encounter for screening for malignant neoplasm of rectum: Secondary | ICD-10-CM | POA: Diagnosis not present

## 2022-10-21 DIAGNOSIS — R7989 Other specified abnormal findings of blood chemistry: Secondary | ICD-10-CM | POA: Diagnosis not present

## 2022-10-21 DIAGNOSIS — Z125 Encounter for screening for malignant neoplasm of prostate: Secondary | ICD-10-CM | POA: Diagnosis not present

## 2022-10-21 DIAGNOSIS — N1831 Chronic kidney disease, stage 3a: Secondary | ICD-10-CM | POA: Diagnosis not present

## 2022-10-28 DIAGNOSIS — C641 Malignant neoplasm of right kidney, except renal pelvis: Secondary | ICD-10-CM | POA: Diagnosis not present

## 2022-10-28 DIAGNOSIS — D631 Anemia in chronic kidney disease: Secondary | ICD-10-CM | POA: Diagnosis not present

## 2022-10-28 DIAGNOSIS — I129 Hypertensive chronic kidney disease with stage 1 through stage 4 chronic kidney disease, or unspecified chronic kidney disease: Secondary | ICD-10-CM | POA: Diagnosis not present

## 2022-10-28 DIAGNOSIS — E782 Mixed hyperlipidemia: Secondary | ICD-10-CM | POA: Diagnosis not present

## 2022-10-28 DIAGNOSIS — R82998 Other abnormal findings in urine: Secondary | ICD-10-CM | POA: Diagnosis not present

## 2022-10-28 DIAGNOSIS — M1990 Unspecified osteoarthritis, unspecified site: Secondary | ICD-10-CM | POA: Diagnosis not present

## 2022-10-28 DIAGNOSIS — N1831 Chronic kidney disease, stage 3a: Secondary | ICD-10-CM | POA: Diagnosis not present

## 2022-10-28 DIAGNOSIS — C9000 Multiple myeloma not having achieved remission: Secondary | ICD-10-CM | POA: Diagnosis not present

## 2022-10-28 DIAGNOSIS — Z0000 Encounter for general adult medical examination without abnormal findings: Secondary | ICD-10-CM | POA: Diagnosis not present

## 2022-10-28 DIAGNOSIS — R0789 Other chest pain: Secondary | ICD-10-CM | POA: Diagnosis not present

## 2022-10-28 DIAGNOSIS — R972 Elevated prostate specific antigen [PSA]: Secondary | ICD-10-CM | POA: Diagnosis not present

## 2022-10-28 DIAGNOSIS — M899 Disorder of bone, unspecified: Secondary | ICD-10-CM | POA: Diagnosis not present

## 2023-01-03 DIAGNOSIS — C61 Malignant neoplasm of prostate: Secondary | ICD-10-CM | POA: Diagnosis not present

## 2023-01-03 DIAGNOSIS — C9002 Multiple myeloma in relapse: Secondary | ICD-10-CM | POA: Diagnosis not present

## 2023-01-05 DIAGNOSIS — Z85528 Personal history of other malignant neoplasm of kidney: Secondary | ICD-10-CM | POA: Diagnosis not present

## 2023-01-05 DIAGNOSIS — C9002 Multiple myeloma in relapse: Secondary | ICD-10-CM | POA: Diagnosis not present

## 2023-01-05 DIAGNOSIS — N189 Chronic kidney disease, unspecified: Secondary | ICD-10-CM | POA: Diagnosis not present

## 2023-01-05 DIAGNOSIS — Z905 Acquired absence of kidney: Secondary | ICD-10-CM | POA: Diagnosis not present

## 2023-01-05 DIAGNOSIS — C642 Malignant neoplasm of left kidney, except renal pelvis: Secondary | ICD-10-CM | POA: Diagnosis not present

## 2023-01-05 DIAGNOSIS — Z91040 Latex allergy status: Secondary | ICD-10-CM | POA: Diagnosis not present

## 2023-01-05 DIAGNOSIS — Z880 Allergy status to penicillin: Secondary | ICD-10-CM | POA: Diagnosis not present

## 2023-01-05 DIAGNOSIS — C9000 Multiple myeloma not having achieved remission: Secondary | ICD-10-CM | POA: Diagnosis not present

## 2023-02-04 ENCOUNTER — Encounter (HOSPITAL_COMMUNITY): Payer: Self-pay | Admitting: *Deleted

## 2023-02-04 ENCOUNTER — Ambulatory Visit (HOSPITAL_COMMUNITY)
Admission: EM | Admit: 2023-02-04 | Discharge: 2023-02-04 | Disposition: A | Discharge status: Home / Self Care | Payer: Medicare HMO

## 2023-02-04 DIAGNOSIS — R682 Dry mouth, unspecified: Secondary | ICD-10-CM | POA: Diagnosis not present

## 2023-02-04 DIAGNOSIS — I10 Essential (primary) hypertension: Secondary | ICD-10-CM

## 2023-02-04 DIAGNOSIS — H938X1 Other specified disorders of right ear: Secondary | ICD-10-CM

## 2023-02-04 MED ORDER — GUAIFENESIN ER 600 MG PO TB12: 600.0000 mg | ORAL_TABLET | Freq: Two times a day (BID) | ORAL | 0 refills | Status: AC

## 2023-02-04 MED ORDER — CETIRIZINE HCL 10 MG PO TABS: 10.0000 mg | ORAL_TABLET | Freq: Every day | ORAL | 0 refills | Status: DC

## 2023-02-04 NOTE — ED Triage Notes (Signed)
C/O starting with bilat ear pain and sensation of hearing being muffled, onset yesterday; but today R>L. Denies fevers. States has had a runny nose in the mornings.

## 2023-02-04 NOTE — ED Provider Notes (Signed)
[name]    CSN: [id number] Arrival date & time: 02/04/23  1718      History   Chief Complaint Chief Complaint  Patient presents with   Otalgia    HPI Marcus Hatfield is a 77 y.o. male.   Reports right ear is muffled, initially was both ears but now just the right.  He reports having sore throat and a mild cough about a week and a half ago as well as some nasal congestion.  Today he denies dizziness, denies ear pain or drainage, chest pain, shortness of breath or fevers.  The history is provided by the patient.  Otalgia Associated symptoms: no cough and no sore throat     Past Medical History:  Diagnosis Date   Arthritis    CKD (chronic kidney disease) stage 3, GFR 30-59 ml/min (HCC) 20015   Creat 1.9   Hypertension    Multiple myeloma ([place]) 2016   [facility]   Prostate disorder 02/2017    Patient Active Problem List   Diagnosis Date Noted   Renal mass 07/07/2021   AKI (acute kidney injury) ([place]) 05/05/2021   Acute urinary retention 03/11/2017   Hyponatremia 03/11/2017   HTN (hypertension) 03/11/2017   CKD (chronic kidney disease) stage 3, GFR 30-59 ml/min ([place]) 03/11/2017   External hemorrhoid, thrombosed 04/16/2012    Past Surgical History:  Procedure Laterality Date   EYE SURGERY Bilateral 01/30/2018   KNEE SURGERY     over ten years ago   NEPHRECTOMY Right 03/2015   ROBOTIC ASSITED PARTIAL NEPHRECTOMY Left 07/07/2021   Procedure: XI ROBOTIC ASSITED PARTIAL NEPHRECTOMY;  Surgeon: Alexis Frock, MD;  Location: [facility];  Service: Urology;  Laterality: Left;  5 HRS   XI ROBOTIC ASSISTED SIMPLE PROSTATECTOMY N/A 07/07/2021   Procedure: XI ROBOTIC ASSISTED SIMPLE PROSTATECTOMY;  Surgeon: Alexis Frock, MD;  Location: [facility];  Service: Urology;  Laterality: N/A;       Home Medications    Prior to Admission medications   Medication Sig Start Date End Date Taking? Authorizing Provider  cetirizine (ZYRTEC ALLERGY) 10 MG tablet Take 1 tablet  (10 mg total) by mouth daily for 10 days. 02/04/23 02/14/23 Yes Louretta Shorten, [place] N, FNP  docusate sodium (COLACE) 100 MG capsule Take 1 capsule (100 mg total) by mouth 2 (two) times daily. 07/07/21  Yes Dancy, Estill Bamberg, PA-C  FINASTERIDE PO Take by mouth.   Yes [provider]  fluticasone (FLONASE) 50 MCG/ACT nasal spray Place 2 sprays into both nostrils daily as needed for allergies. 02/19/21  Yes [provider]  guaiFENesin (MUCINEX) 600 MG 12 hr tablet Take 1 tablet (600 mg total) by mouth 2 (two) times daily for 7 days. 02/04/23 02/11/23 Yes Louretta Shorten, [place] N, FNP  tamsulosin (FLOMAX) 0.4 MG CAPS capsule Take 1 capsule (0.4 mg total) by mouth daily after supper. 05/01/21  Yes Isla Pence, MD  ferrous sulfate 325 (65 FE) MG tablet Take 1 tablet (325 mg total) by mouth 2 (two) times daily with a meal. 05/12/21 08/10/21  [place] Marcus Poag, DO  lactulose (CHRONULAC) 10 GM/15ML solution Take 10 g by mouth daily as needed for constipation. 12/07/16   [provider]    Family History Family History  Problem Relation Age of Onset   Diabetes Father    Hypertension Brother     Social History Social History   Tobacco Use   Smoking status: Never   Smokeless tobacco: Never  Vaping Use   Vaping Use: Never  used  Substance Use Topics   Alcohol use: No   Drug use: No     Allergies   Penicillins and Latex   Review of Systems Review of Systems  Constitutional:  Negative for chills and fatigue.  HENT:  Negative for ear pain and sore throat.   Eyes:  Negative for discharge.  Respiratory:  Negative for cough, chest tightness and shortness of breath.   Cardiovascular:  Negative for chest pain.  Neurological:  Negative for syncope and numbness.     Physical Exam Triage Vital Signs ED Triage Vitals  Enc Vitals Group     BP 02/04/23 1813 (!) 162/85     Pulse Rate 02/04/23 1813 64     Resp 02/04/23 1813 16     Temp 02/04/23 1813 97.8 F (36.6 C)     Temp Source  02/04/23 1813 Oral     SpO2 02/04/23 1813 98 %     Weight --      Height --      Head Circumference --      Peak Flow --      Pain Score 02/04/23 1815 5     Pain Loc --      Pain Edu? --      Excl. in [place]? --    No data found.  Updated Vital Signs BP (!) 162/85   Pulse 64   Temp 97.8 F (36.6 C) (Oral)   Resp 16   SpO2 98%   Visual Acuity Right Eye Distance:   Left Eye Distance:   Bilateral Distance:    Right Eye Near:   Left Eye Near:    Bilateral Near:     Physical Exam Vitals and nursing note reviewed.  Constitutional:      Appearance: Normal appearance.     Comments: Pleasant 77 year old male who appears stated age.  HENT:     Head: Normocephalic and atraumatic.     Right Ear: Ear canal and external ear normal. A middle ear effusion is present.     Left Ear: Ear canal and external ear normal.  No middle ear effusion.     Ears:     Comments: Fluid noted behind tympanic membrane on right side.  External canal without drainage.  Pinna and tragus without tenderness to manipulation.    Nose: Rhinorrhea present.     Mouth/Throat:     Mouth: Mucous membranes are moist.     Pharynx: Posterior oropharyngeal erythema present.  Eyes:     General: Lids are normal.     Pupils: Pupils are equal, round, and reactive to light.  Cardiovascular:     Rate and Rhythm: Normal rate and regular rhythm.     Pulses: Normal pulses.     Heart sounds: Normal heart sounds, S1 normal and S2 normal.  Pulmonary:     Effort: Pulmonary effort is normal. No respiratory distress.     Breath sounds: Normal breath sounds. No wheezing.     Comments: Lungs vesicular posteriorly. Musculoskeletal:     Cervical back: Normal range of motion.  Neurological:     Mental Status: He is alert.  Psychiatric:        Behavior: Behavior is cooperative.      UC Treatments / Results  Labs (all labs ordered are listed, but only abnormal results are displayed) Labs Reviewed - No data to  display  EKG   Radiology No results found.  Procedures Procedures (including critical care time)  Medications Ordered in UC  Medications - No data to display  Initial Impression / Assessment and Plan / UC Course  I have reviewed the triage vital signs and the nursing notes.  Pertinent labs & imaging results that were available during my care of the patient were reviewed by me and considered in my medical decision making (see chart for details).  Patient vital signs in triage note reviewed.  Patient is hemodynamically stable.  Patient presents to clinic for right ear fullness, there is mid ear effusion on exam.  Tympanic membrane intact, external auditory canal without signs or symptoms of infection.  Will trial antihistamine and decongestion as well as continued Flonase.  Patient is hypertensive on presentation, with history of same, reports unsure if they took all of their antihypertensives today but normally blood pressure remains 130s at home.  Plan of care and return precautions discussed with patient, verbalized understanding.    Final Clinical Impressions(s) / UC Diagnoses   Final diagnoses:  Ear fullness, right  Essential hypertension  Dry mouth     Discharge Instructions      Your right ear was without signs of infection today.  Your tympanic membrane was intact.  There was fluid behind it, indicating fluid buildup, this is usually from nasal congestion.  I would like you to continue on your Flonase.  Please also start taking Mucinex 2 times daily as well as cetirizine 10 mg once daily.  Please make sure you are drinking 64 ounces of water per day and staying hydrated.  For your dry mouth you can do over-the-counter cough drops or chew gum as this can help stimulate saliva production.  Please follow-up with your primary care or return here if symptoms worsen, or no improvement.     ED Prescriptions     Medication Sig Dispense Auth. Provider   guaiFENesin (MUCINEX) 600  MG 12 hr tablet Take 1 tablet (600 mg total) by mouth 2 (two) times daily for 7 days. 14 tablet Louretta Shorten, [place] N, [place]   cetirizine (ZYRTEC ALLERGY) 10 MG tablet Take 1 tablet (10 mg total) by mouth daily for 10 days. 10 tablet Laraina Sulton, [place] N, [place]      I have reviewed the PDMP during this encounter.   Maisha Bogen, [place] N, [place] 02/04/23 [phone]

## 2023-02-04 NOTE — Discharge Instructions (Addendum)
Your right ear was without signs of infection today.  Your tympanic membrane was intact.  There was fluid behind it, indicating fluid buildup, this is usually from nasal congestion.  I would like you to continue on your Flonase.  Please also start taking Mucinex 2 times daily as well as cetirizine 10 mg once daily.  Please make sure you are drinking 64 ounces of water per day and staying hydrated.  For your dry mouth you can do over-the-counter cough drops or chew gum as this can help stimulate saliva production.  Please follow-up with your primary care or return here if symptoms worsen, or no improvement.

## 2023-02-22 DIAGNOSIS — I129 Hypertensive chronic kidney disease with stage 1 through stage 4 chronic kidney disease, or unspecified chronic kidney disease: Secondary | ICD-10-CM | POA: Diagnosis not present

## 2023-02-22 DIAGNOSIS — N1831 Chronic kidney disease, stage 3a: Secondary | ICD-10-CM | POA: Diagnosis not present

## 2023-02-22 DIAGNOSIS — J0100 Acute maxillary sinusitis, unspecified: Secondary | ICD-10-CM | POA: Diagnosis not present

## 2023-02-22 DIAGNOSIS — R0981 Nasal congestion: Secondary | ICD-10-CM | POA: Diagnosis not present

## 2023-02-22 DIAGNOSIS — H9201 Otalgia, right ear: Secondary | ICD-10-CM | POA: Diagnosis not present

## 2023-02-22 DIAGNOSIS — H6991 Unspecified Eustachian tube disorder, right ear: Secondary | ICD-10-CM | POA: Diagnosis not present

## 2023-02-22 DIAGNOSIS — H9191 Unspecified hearing loss, right ear: Secondary | ICD-10-CM | POA: Diagnosis not present

## 2023-04-17 DIAGNOSIS — E782 Mixed hyperlipidemia: Secondary | ICD-10-CM | POA: Diagnosis not present

## 2023-04-17 DIAGNOSIS — M25512 Pain in left shoulder: Secondary | ICD-10-CM | POA: Diagnosis not present

## 2023-04-17 DIAGNOSIS — I129 Hypertensive chronic kidney disease with stage 1 through stage 4 chronic kidney disease, or unspecified chronic kidney disease: Secondary | ICD-10-CM | POA: Diagnosis not present

## 2023-05-01 DIAGNOSIS — E782 Mixed hyperlipidemia: Secondary | ICD-10-CM | POA: Diagnosis not present

## 2023-05-01 DIAGNOSIS — D631 Anemia in chronic kidney disease: Secondary | ICD-10-CM | POA: Diagnosis not present

## 2023-05-01 DIAGNOSIS — I129 Hypertensive chronic kidney disease with stage 1 through stage 4 chronic kidney disease, or unspecified chronic kidney disease: Secondary | ICD-10-CM | POA: Diagnosis not present

## 2023-05-01 DIAGNOSIS — M1990 Unspecified osteoarthritis, unspecified site: Secondary | ICD-10-CM | POA: Diagnosis not present

## 2023-05-01 DIAGNOSIS — C641 Malignant neoplasm of right kidney, except renal pelvis: Secondary | ICD-10-CM | POA: Diagnosis not present

## 2023-05-01 DIAGNOSIS — R972 Elevated prostate specific antigen [PSA]: Secondary | ICD-10-CM | POA: Diagnosis not present

## 2023-05-01 DIAGNOSIS — M899 Disorder of bone, unspecified: Secondary | ICD-10-CM | POA: Diagnosis not present

## 2023-05-01 DIAGNOSIS — C9000 Multiple myeloma not having achieved remission: Secondary | ICD-10-CM | POA: Diagnosis not present

## 2023-05-01 DIAGNOSIS — M25512 Pain in left shoulder: Secondary | ICD-10-CM | POA: Diagnosis not present

## 2023-05-01 DIAGNOSIS — N1831 Chronic kidney disease, stage 3a: Secondary | ICD-10-CM | POA: Diagnosis not present

## 2023-05-02 ENCOUNTER — Other Ambulatory Visit (HOSPITAL_COMMUNITY): Payer: Self-pay | Admitting: Urology

## 2023-05-02 ENCOUNTER — Ambulatory Visit (HOSPITAL_COMMUNITY)
Admission: RE | Admit: 2023-05-02 | Discharge: 2023-05-02 | Disposition: A | Discharge status: Home / Self Care | Payer: Medicare HMO | Source: Ambulatory Visit | Attending: Urology | Admitting: Urology

## 2023-05-02 DIAGNOSIS — Z0389 Encounter for observation for other suspected diseases and conditions ruled out: Secondary | ICD-10-CM | POA: Diagnosis not present

## 2023-05-02 DIAGNOSIS — C642 Malignant neoplasm of left kidney, except renal pelvis: Secondary | ICD-10-CM

## 2023-05-05 DIAGNOSIS — C642 Malignant neoplasm of left kidney, except renal pelvis: Secondary | ICD-10-CM | POA: Diagnosis not present

## 2023-05-05 DIAGNOSIS — N281 Cyst of kidney, acquired: Secondary | ICD-10-CM | POA: Diagnosis not present

## 2023-05-06 ENCOUNTER — Emergency Department (HOSPITAL_COMMUNITY): Payer: Medicare HMO

## 2023-05-06 ENCOUNTER — Other Ambulatory Visit: Payer: Self-pay

## 2023-05-06 ENCOUNTER — Encounter (HOSPITAL_COMMUNITY): Payer: Self-pay

## 2023-05-06 ENCOUNTER — Emergency Department (HOSPITAL_COMMUNITY)
Admission: EM | Admit: 2023-05-06 | Discharge: 2023-05-06 | Disposition: A | Discharge status: Home / Self Care | Payer: Medicare HMO | Attending: Emergency Medicine | Admitting: Emergency Medicine

## 2023-05-06 DIAGNOSIS — X501XXA Overexertion from prolonged static or awkward postures, initial encounter: Secondary | ICD-10-CM | POA: Diagnosis not present

## 2023-05-06 DIAGNOSIS — N189 Chronic kidney disease, unspecified: Secondary | ICD-10-CM | POA: Diagnosis not present

## 2023-05-06 DIAGNOSIS — Z91040 Latex allergy status: Secondary | ICD-10-CM | POA: Diagnosis not present

## 2023-05-06 DIAGNOSIS — I129 Hypertensive chronic kidney disease with stage 1 through stage 4 chronic kidney disease, or unspecified chronic kidney disease: Secondary | ICD-10-CM | POA: Insufficient documentation

## 2023-05-06 DIAGNOSIS — Y92810 Car as the place of occurrence of the external cause: Secondary | ICD-10-CM | POA: Diagnosis not present

## 2023-05-06 DIAGNOSIS — M25551 Pain in right hip: Secondary | ICD-10-CM | POA: Diagnosis not present

## 2023-05-06 DIAGNOSIS — M84411A Pathological fracture, right shoulder, initial encounter for fracture: Secondary | ICD-10-CM | POA: Diagnosis not present

## 2023-05-06 DIAGNOSIS — Z85528 Personal history of other malignant neoplasm of kidney: Secondary | ICD-10-CM | POA: Insufficient documentation

## 2023-05-06 DIAGNOSIS — S42021A Displaced fracture of shaft of right clavicle, initial encounter for closed fracture: Secondary | ICD-10-CM | POA: Diagnosis not present

## 2023-05-06 DIAGNOSIS — M25511 Pain in right shoulder: Secondary | ICD-10-CM | POA: Diagnosis not present

## 2023-05-06 DIAGNOSIS — R079 Chest pain, unspecified: Secondary | ICD-10-CM | POA: Diagnosis not present

## 2023-05-06 MED ORDER — HYDROCODONE-ACETAMINOPHEN 5-325 MG PO TABS: 1.0000 | ORAL_TABLET | Freq: Four times a day (QID) | ORAL | 0 refills | Status: DC | PRN

## 2023-05-06 NOTE — ED Provider Notes (Signed)
[place] [facility] AT [facility] Provider Note   CSN: [id number] Arrival date & time: 05/06/23  0746     History  Chief Complaint  Patient presents with   Shoulder Pain    Horacio Paller is a 77 y.o. male.  Patient with history of multiple myeloma and renal cell carcinoma in remission, CKD, hypertension presents today with complaints of right shoulder pain.  He states that same began yesterday when he was getting out of the car and felt a pop followed by pain in his right shoulder area. He has had difficulty ranging his shoulder since then. Denies headaches, numbness/tingling, chest pain, shortness of breath.  The history is provided by the patient. No language interpreter was used.  Shoulder Pain      Home Medications Prior to Admission medications   Medication Sig Start Date End Date Taking? Authorizing Provider  cetirizine (ZYRTEC ALLERGY) 10 MG tablet Take 1 tablet (10 mg total) by mouth daily for 10 days. 02/04/23 02/14/23  Garrison, [place] N, FNP  docusate sodium (COLACE) 100 MG capsule Take 1 capsule (100 mg total) by mouth 2 (two) times daily. 07/07/21   Harrie Foreman, PA-C  ferrous sulfate 325 (65 FE) MG tablet Take 1 tablet (325 mg total) by mouth 2 (two) times daily with a meal. 05/12/21 08/10/21  [place], Eric J, DO  FINASTERIDE PO Take by mouth.    [provider]  fluticasone (FLONASE) 50 MCG/ACT nasal spray Place 2 sprays into both nostrils daily as needed for allergies. 02/19/21   [provider]  lactulose (CHRONULAC) 10 GM/15ML solution Take 10 g by mouth daily as needed for constipation. 12/07/16   [provider]  tamsulosin (FLOMAX) 0.4 MG CAPS capsule Take 1 capsule (0.4 mg total) by mouth daily after supper. 05/01/21   [name], MD      Allergies    Penicillins and Latex    Review of Systems   Review of Systems  All other systems reviewed and are negative.   Physical Exam Updated Vital Signs BP  135/82   Pulse 64   Temp 98.6 F (37 C)   Resp 18   Ht 5\' 8"  (1.727 m)   Wt 81.6 kg   SpO2 100%   BMI 27.35 kg/m  Physical Exam Vitals and nursing note reviewed.  Constitutional:      General: He is not in acute distress.    Appearance: Normal appearance. He is normal weight. He is not ill-appearing, toxic-appearing or diaphoretic.  HENT:     Head: Normocephalic and atraumatic.  Eyes:     Extraocular Movements: Extraocular movements intact.     Pupils: Pupils are equal, round, and reactive to light.  Cardiovascular:     Rate and Rhythm: Normal rate and regular rhythm.     Pulses:          Carotid pulses are 2+ on the right side and 2+ on the left side.      Radial pulses are 2+ on the right side.     Heart sounds: Normal heart sounds.  Pulmonary:     Effort: Pulmonary effort is normal. No respiratory distress.     Breath sounds: Normal breath sounds.  Chest:       Comments: Tenderness to palpation over the right medial clavicle.  No tenting or obvious deformity. Abdominal:     General: Abdomen is flat.     Palpations: Abdomen is soft.     Tenderness: There is no  abdominal tenderness.  Musculoskeletal:        General: Normal range of motion.     Cervical back: Normal range of motion and neck supple.     Comments: Able to complete passive ROM of the right shoulder. Active ROM limited due to pain. Radial pulse intact and 2+  Skin:    General: Skin is warm and dry.  Neurological:     General: No focal deficit present.     Mental Status: He is alert.  Psychiatric:        Mood and Affect: Mood normal.        Behavior: Behavior normal.     ED Results / Procedures / Treatments   Labs (all labs ordered are listed, but only abnormal results are displayed) Labs Reviewed - No data to display  EKG None  Radiology DG Chest 2 View  Result Date: 05/06/2023 CLINICAL DATA:  77 year old male with history of right clavicular pain. EXAM: CHEST - 2 VIEW COMPARISON:  Chest  x-ray 05/02/2023. FINDINGS: Mildly displaced fracture of the medial right clavicle. Lucency in the region of the medial right clavicle suggesting an underlying bony lesion. Old healed fractures of the lateral right fifth and sixth ribs with abundant callus formation. Lung volumes are normal. No consolidative airspace disease. No pleural effusions. No pneumothorax. No pulmonary nodule or mass noted. Pulmonary vasculature and the cardiomediastinal silhouette are within normal limits. IMPRESSION: 1.  No radiographic evidence of acute cardiopulmonary disease. 2. Probable pathologic fracture of the medial right clavicle better demonstrated on dedicated right clavicular radiographs. 3. Old healed fractures of the lateral right fifth and sixth ribs. Electronically Signed   By: Trudie Reed M.D.   On: 05/06/2023 09:56   DG Clavicle Right  Result Date: 05/06/2023 CLINICAL DATA:  77 year old male with history of pain in the region of the right clavicle. EXAM: RIGHT CLAVICLE - 2+ VIEWS COMPARISON:  No priors. FINDINGS: Multiple lucent lesions in the right clavicle, which could reflect metastatic lesions in this patient with history of renal cell carcinoma, or could be myelomatous lesions. Mildly displaced fracture of the medial right clavicle noted with approximately 8 mm of inferior displacement of the distal aspect of the clavicle. IMPRESSION: 1. Mildly displaced likely pathologic fracture of the medial right clavicle, as above. Multiple additional lytic lesions in the clavicle could represent myelomatous lesions or metastatic lesions in this patient with history of renal cell carcinoma. Electronically Signed   By: Trudie Reed M.D.   On: 05/06/2023 09:54   DG Shoulder Right  Result Date: 05/06/2023 CLINICAL DATA:  Right shoulder pain. EXAM: RIGHT SHOULDER - 3 VIEW COMPARISON:  None Available. FINDINGS: No acute fracture or dislocation. Degenerative spurring at the acromioclavicular joint. Few subtle lucent  areas are seen over the humeral diaphysis (with endosteal scalloping) and at the mid clavicle. Remote fifth and sixth rib fractures with hypertrophic callus. There is known history of multiple myeloma with lesion seen on chest CT from 09/26/2022 IMPRESSION: 1. No acute finding. 2. Known myeloma. Electronically Signed   By: Tiburcio Pea M.D.   On: 05/06/2023 08:41    Procedures Procedures    Medications Ordered in ED Medications - No data to display  ED Course/ Medical Decision Making/ A&P                             Medical Decision Making Amount and/or Complexity of Data Reviewed Radiology: ordered.   Patient presents  today with complaints of right shoulder injury yesterday.  He is afebrile, nontoxic-appearing, and in no acute distress with reassuring vital signs.  Physical exam reveals TTP over the right right medial clavicle.  No tenting or obvious deformity.  X-ray imaging obtained of same which shows  1. Mildly displaced likely pathologic fracture of the medial right clavicle, as above. Multiple additional lytic lesions in the clavicle could represent myelomatous lesions or metastatic lesions in this patient with history of renal cell carcinoma.   I have personally reviewed and interpreted this imaging and agree with radiology interpretation.  Discussed patient with oncology on call Dr. Pamelia Hoit who states that additional work-up can be managed outpatient through his oncologist.   Patient placed in a sling for management of his pathologic fracture.  Will give referral to orthopedics for follow-up.  I have also given him a prescription for Norco for pain.  PDMP reviewed.  Patient advised not to drive or operate heavy machinery while taking this medication and as well as the increased fall risk.  Also discussed recommendations for close follow-up with his oncologist outpatient. Evaluation and diagnostic testing in the emergency department does not suggest an emergent condition  requiring admission or immediate intervention beyond what has been performed at this time.  Plan for discharge with close PCP follow-up.  Patient is understanding and amenable with plan, educated on red flag symptoms that would prompt immediate return.  Patient discharged in stable condition.   This is a shared visit with supervising physician Dr. Criss Alvine who has independently evaluated patient & provided guidance in evaluation/management/disposition, in agreement with care    Final Clinical Impression(s) / ED Diagnoses Final diagnoses:  Pathological fracture of right clavicle, initial encounter    Rx / DC Orders ED Discharge Orders          Ordered    HYDROcodone-acetaminophen (NORCO/VICODIN) 5-325 MG tablet  Every 6 hours PRN        05/06/23 1127          An After Visit Summary was printed and given to the patient.     Vear Clock 05/06/23 1129    Pricilla Loveless, MD 05/07/23 1335

## 2023-05-06 NOTE — ED Notes (Signed)
Patient transported to X-ray 

## 2023-05-06 NOTE — ED Triage Notes (Signed)
PT arrives ambulatory POV with a c/o of right shoulder pain starting yesterday.PT was getting out of the car and he heard a pop. Left shoulder pain started hurting a month ago but its not as bad as the right.

## 2023-05-06 NOTE — Progress Notes (Signed)
Orthopedic Tech Progress Note Patient Details:  Marcus Hatfield 10-Apr-1946 [id number]  Ortho Devices Type of Ortho Device: Sling immobilizer Ortho Device/Splint Location: RUE Ortho Device/Splint Interventions: Application   Post Interventions Patient Tolerated: Well Instructions Provided: Adjustment of device  Marcus Hatfield [name] 05/06/2023, 11:03 AM

## 2023-05-06 NOTE — Discharge Instructions (Addendum)
As we discussed, you do have a broken right collarbone.  We have given you a sling to wear for management of this.  I have also given you a referral to orthopedics for follow-up.  Please call them to schedule an appointment at your earliest convenience.  As this fracture may be related to your cancer, it is also very important that you follow-up with your oncologist at your earliest convenience for continued evaluation and management of this.  Additionally, I given you a prescription for Norco which is a narcotic pain medication for you to take as prescribed as needed for severe pain only.  Do not drive or operate heavy machinery while taking this medication as it can be sedating.  I also recommend that you rest and ice this area for additional pain relief.  Return if development of any new or worsening symptoms.

## 2023-05-09 DIAGNOSIS — C642 Malignant neoplasm of left kidney, except renal pelvis: Secondary | ICD-10-CM | POA: Diagnosis not present

## 2023-05-09 DIAGNOSIS — C641 Malignant neoplasm of right kidney, except renal pelvis: Secondary | ICD-10-CM | POA: Diagnosis not present

## 2023-05-09 DIAGNOSIS — N138 Other obstructive and reflux uropathy: Secondary | ICD-10-CM | POA: Diagnosis not present

## 2023-05-09 DIAGNOSIS — N5201 Erectile dysfunction due to arterial insufficiency: Secondary | ICD-10-CM | POA: Diagnosis not present

## 2023-05-16 ENCOUNTER — Telehealth: Payer: Self-pay | Admitting: *Deleted

## 2023-05-16 DIAGNOSIS — H353121 Nonexudative age-related macular degeneration, left eye, early dry stage: Secondary | ICD-10-CM | POA: Diagnosis not present

## 2023-05-16 DIAGNOSIS — M799 Soft tissue disorder, unspecified: Secondary | ICD-10-CM | POA: Diagnosis not present

## 2023-05-16 DIAGNOSIS — Z961 Presence of intraocular lens: Secondary | ICD-10-CM | POA: Diagnosis not present

## 2023-05-16 DIAGNOSIS — H468 Other optic neuritis: Secondary | ICD-10-CM | POA: Diagnosis not present

## 2023-05-16 NOTE — Telephone Encounter (Signed)
Transition Care Management Unsuccessful Follow-up Telephone Call  Date of discharge and from where:  [place] ed 5/18  Attempts:  1st Attempt  Reason for unsuccessful TCM follow-up call:  Left voice message

## 2023-05-17 ENCOUNTER — Telehealth: Payer: Self-pay | Admitting: *Deleted

## 2023-05-17 NOTE — Telephone Encounter (Signed)
Transition Care Management Unsuccessful Follow-up Telephone Call  Date of discharge and from where:  [place] ed 05/06/2023  Attempts:  2nd Attempt  Reason for unsuccessful TCM follow-up call:  Left voice message

## 2023-05-18 DIAGNOSIS — H468 Other optic neuritis: Secondary | ICD-10-CM | POA: Diagnosis not present

## 2023-05-18 DIAGNOSIS — H5340 Unspecified visual field defects: Secondary | ICD-10-CM | POA: Diagnosis not present

## 2023-05-19 DIAGNOSIS — C9001 Multiple myeloma in remission: Secondary | ICD-10-CM | POA: Diagnosis not present

## 2023-05-20 ENCOUNTER — Emergency Department (HOSPITAL_COMMUNITY): Payer: Medicare HMO

## 2023-05-20 ENCOUNTER — Inpatient Hospital Stay (HOSPITAL_COMMUNITY)
Admission: EM | Admit: 2023-05-20 | Discharge: 2023-05-23 | DRG: 841 | Disposition: A | Discharge status: Home / Self Care | Payer: Medicare HMO | Attending: Internal Medicine | Admitting: Internal Medicine

## 2023-05-20 ENCOUNTER — Encounter (HOSPITAL_COMMUNITY): Payer: Self-pay | Admitting: Internal Medicine

## 2023-05-20 ENCOUNTER — Other Ambulatory Visit: Payer: Self-pay

## 2023-05-20 DIAGNOSIS — M1990 Unspecified osteoarthritis, unspecified site: Secondary | ICD-10-CM | POA: Diagnosis present

## 2023-05-20 DIAGNOSIS — N1832 Chronic kidney disease, stage 3b: Secondary | ICD-10-CM | POA: Diagnosis present

## 2023-05-20 DIAGNOSIS — K769 Liver disease, unspecified: Secondary | ICD-10-CM | POA: Diagnosis not present

## 2023-05-20 DIAGNOSIS — Z91040 Latex allergy status: Secondary | ICD-10-CM

## 2023-05-20 DIAGNOSIS — C9002 Multiple myeloma in relapse: Secondary | ICD-10-CM | POA: Diagnosis not present

## 2023-05-20 DIAGNOSIS — Z833 Family history of diabetes mellitus: Secondary | ICD-10-CM | POA: Diagnosis not present

## 2023-05-20 DIAGNOSIS — Z85528 Personal history of other malignant neoplasm of kidney: Secondary | ICD-10-CM | POA: Diagnosis not present

## 2023-05-20 DIAGNOSIS — D631 Anemia in chronic kidney disease: Secondary | ICD-10-CM | POA: Diagnosis present

## 2023-05-20 DIAGNOSIS — D630 Anemia in neoplastic disease: Secondary | ICD-10-CM | POA: Diagnosis not present

## 2023-05-20 DIAGNOSIS — M84411A Pathological fracture, right shoulder, initial encounter for fracture: Secondary | ICD-10-CM | POA: Diagnosis not present

## 2023-05-20 DIAGNOSIS — Z8249 Family history of ischemic heart disease and other diseases of the circulatory system: Secondary | ICD-10-CM

## 2023-05-20 DIAGNOSIS — G9389 Other specified disorders of brain: Principal | ICD-10-CM

## 2023-05-20 DIAGNOSIS — C641 Malignant neoplasm of right kidney, except renal pelvis: Secondary | ICD-10-CM | POA: Diagnosis not present

## 2023-05-20 DIAGNOSIS — R220 Localized swelling, mass and lump, head: Secondary | ICD-10-CM | POA: Diagnosis not present

## 2023-05-20 DIAGNOSIS — N400 Enlarged prostate without lower urinary tract symptoms: Secondary | ICD-10-CM | POA: Diagnosis not present

## 2023-05-20 DIAGNOSIS — Z880 Allergy status to penicillin: Secondary | ICD-10-CM

## 2023-05-20 DIAGNOSIS — J9811 Atelectasis: Secondary | ICD-10-CM | POA: Diagnosis not present

## 2023-05-20 DIAGNOSIS — C9000 Multiple myeloma not having achieved remission: Secondary | ICD-10-CM | POA: Diagnosis not present

## 2023-05-20 DIAGNOSIS — Z888 Allergy status to other drugs, medicaments and biological substances status: Secondary | ICD-10-CM | POA: Diagnosis not present

## 2023-05-20 DIAGNOSIS — E8581 Light chain (AL) amyloidosis: Secondary | ICD-10-CM | POA: Diagnosis not present

## 2023-05-20 DIAGNOSIS — H53132 Sudden visual loss, left eye: Secondary | ICD-10-CM | POA: Diagnosis not present

## 2023-05-20 DIAGNOSIS — C719 Malignant neoplasm of brain, unspecified: Secondary | ICD-10-CM | POA: Diagnosis not present

## 2023-05-20 DIAGNOSIS — E871 Hypo-osmolality and hyponatremia: Secondary | ICD-10-CM | POA: Diagnosis not present

## 2023-05-20 DIAGNOSIS — Z905 Acquired absence of kidney: Secondary | ICD-10-CM

## 2023-05-20 DIAGNOSIS — M84411D Pathological fracture, right shoulder, subsequent encounter for fracture with routine healing: Secondary | ICD-10-CM | POA: Diagnosis not present

## 2023-05-20 DIAGNOSIS — H538 Other visual disturbances: Secondary | ICD-10-CM | POA: Diagnosis not present

## 2023-05-20 DIAGNOSIS — Z9221 Personal history of antineoplastic chemotherapy: Secondary | ICD-10-CM | POA: Diagnosis not present

## 2023-05-20 DIAGNOSIS — N281 Cyst of kidney, acquired: Secondary | ICD-10-CM | POA: Diagnosis not present

## 2023-05-20 DIAGNOSIS — H5462 Unqualified visual loss, left eye, normal vision right eye: Secondary | ICD-10-CM

## 2023-05-20 DIAGNOSIS — Z79899 Other long term (current) drug therapy: Secondary | ICD-10-CM | POA: Diagnosis not present

## 2023-05-20 DIAGNOSIS — I129 Hypertensive chronic kidney disease with stage 1 through stage 4 chronic kidney disease, or unspecified chronic kidney disease: Secondary | ICD-10-CM | POA: Diagnosis not present

## 2023-05-20 LAB — BASIC METABOLIC PANEL
Anion gap: 7 (ref 5–15)
BUN: 26 mg/dL — ABNORMAL HIGH (ref 8–23)
CO2: 24 mmol/L (ref 22–32)
Calcium: 9 mg/dL (ref 8.9–10.3)
Chloride: 98 mmol/L (ref 98–111)
Creatinine, Ser: 1.87 mg/dL — ABNORMAL HIGH (ref 0.61–1.24)
GFR, Estimated: 37 mL/min — ABNORMAL LOW (ref >60)
Glucose, Bld: 96 mg/dL (ref 70–99)
Potassium: 4.6 mmol/L (ref 3.5–5.1)
Sodium: 129 mmol/L — ABNORMAL LOW (ref 135–145)

## 2023-05-20 LAB — VITAMIN D 25 HYDROXY (VIT D DEFICIENCY, FRACTURES): Vit D, 25-Hydroxy: 41.55 ng/mL (ref 30–100)

## 2023-05-20 LAB — LACTATE DEHYDROGENASE: LDH: 174 U/L (ref 98–192)

## 2023-05-20 LAB — CBC
HCT: 34.6 % — ABNORMAL LOW (ref 39.0–52.0)
Hemoglobin: 11.3 g/dL — ABNORMAL LOW (ref 13.0–17.0)
MCH: 29.1 pg (ref 26.0–34.0)
MCHC: 32.7 g/dL (ref 30.0–36.0)
MCV: 89.2 fL (ref 80.0–100.0)
Platelets: 266 10*3/uL (ref 150–400)
RBC: 3.88 MIL/uL — ABNORMAL LOW (ref 4.22–5.81)
RDW: 14.4 % (ref 11.5–15.5)
WBC: 5.3 10*3/uL (ref 4.0–10.5)
nRBC: 0 % (ref 0.0–0.2)

## 2023-05-20 LAB — SODIUM, URINE, RANDOM: Sodium, Ur: 69 mmol/L

## 2023-05-20 LAB — PSA: Prostatic Specific Antigen: 0.58 ng/mL (ref 0.00–4.00)

## 2023-05-20 LAB — SEDIMENTATION RATE: Sed Rate: 83 mm/hr — ABNORMAL HIGH (ref 0–16)

## 2023-05-20 LAB — OSMOLALITY, URINE: Osmolality, Ur: 421 mOsm/kg (ref 300–900)

## 2023-05-20 LAB — OSMOLALITY: Osmolality: 302 mOsm/kg — ABNORMAL HIGH (ref 275–295)

## 2023-05-20 MED ORDER — FLUTICASONE PROPIONATE 50 MCG/ACT NA SUSP: 2.0000 | Freq: Every day | NASAL | Status: DC | PRN

## 2023-05-20 MED ORDER — TAMSULOSIN HCL 0.4 MG PO CAPS
0.4000 mg | ORAL_CAPSULE | Freq: Every day | ORAL | Status: DC
  Administered 2023-05-20 – 2023-05-22 (×3): 0.4 mg via ORAL
  Filled 2023-05-20 (×3): qty 1

## 2023-05-20 MED ORDER — DOCUSATE SODIUM 100 MG PO CAPS
100.0000 mg | ORAL_CAPSULE | Freq: Two times a day (BID) | ORAL | Status: DC
  Administered 2023-05-20 – 2023-05-23 (×6): 100 mg via ORAL
  Filled 2023-05-20 (×6): qty 1

## 2023-05-20 MED ORDER — SODIUM CHLORIDE 0.9 % IV SOLN: INTRAVENOUS | Status: AC

## 2023-05-20 MED ORDER — CARVEDILOL 6.25 MG PO TABS
6.2500 mg | ORAL_TABLET | Freq: Two times a day (BID) | ORAL | Status: DC
  Administered 2023-05-20 – 2023-05-23 (×6): 6.25 mg via ORAL
  Filled 2023-05-20 (×6): qty 1

## 2023-05-20 MED ORDER — LORAZEPAM 2 MG/ML IJ SOLN
1.0000 mg | Freq: Once | INTRAMUSCULAR | Status: AC
  Administered 2023-05-20: 1 mg via INTRAVENOUS
  Filled 2023-05-20: qty 1

## 2023-05-20 MED ORDER — DEXAMETHASONE SODIUM PHOSPHATE 10 MG/ML IJ SOLN
5.0000 mg | Freq: Four times a day (QID) | INTRAMUSCULAR | Status: DC
  Administered 2023-05-20 – 2023-05-23 (×11): 5 mg via INTRAVENOUS
  Filled 2023-05-20 (×11): qty 1

## 2023-05-20 MED ORDER — ASPIRIN 81 MG PO TBEC
81.0000 mg | DELAYED_RELEASE_TABLET | Freq: Every day | ORAL | Status: DC
  Administered 2023-05-21 – 2023-05-23 (×3): 81 mg via ORAL
  Filled 2023-05-20 (×3): qty 1

## 2023-05-20 MED ORDER — FINASTERIDE 5 MG PO TABS
5.0000 mg | ORAL_TABLET | Freq: Every day | ORAL | Status: DC
  Administered 2023-05-21 – 2023-05-23 (×3): 5 mg via ORAL
  Filled 2023-05-20 (×3): qty 1

## 2023-05-20 MED ORDER — GADOBUTROL 1 MMOL/ML IV SOLN
8.0000 mL | Freq: Once | INTRAVENOUS | Status: AC | PRN
  Administered 2023-05-20: 8 mL via INTRAVENOUS

## 2023-05-20 MED ORDER — DEXAMETHASONE SODIUM PHOSPHATE 10 MG/ML IJ SOLN
10.0000 mg | Freq: Once | INTRAMUSCULAR | Status: AC
  Administered 2023-05-20: 10 mg via INTRAVENOUS
  Filled 2023-05-20: qty 1

## 2023-05-20 MED ORDER — ENOXAPARIN SODIUM 40 MG/0.4ML IJ SOSY
40.0000 mg | PREFILLED_SYRINGE | INTRAMUSCULAR | Status: DC
  Administered 2023-05-20 – 2023-05-22 (×3): 40 mg via SUBCUTANEOUS
  Filled 2023-05-20 (×3): qty 0.4

## 2023-05-20 NOTE — ED Notes (Signed)
Pt. Was stumbling upon getting out of bed, pt. Was not able to walk to visual acuity test. Visual acuity test was performed with handheld card while the pt. Was in bed.

## 2023-05-20 NOTE — H&P (Addendum)
History and Physical    [name] ZOX:[id number] DOB: [birth year] DOA: 05/20/2023  PCP: Chilton Greathouse, MD Patient coming from: home   I have personally briefly reviewed patient's old medical records in [facility]  Chief Complaint: Blurry vision in left eye  HPI: Marcus Hatfield is a 77 y.o. male with medical history significant of multiple myeloma, renal cell carcinoma status post nephrectomy, managed by [facility], HTN, CKD 3b, was sent to the ED by ophthalmologist due to concerning of a brain mass, patient report recent history of blurry vision in the left eye and sinus pressure, he was seen by ophthalmologist who obtained a CT of the head showed a brain mass, April scented to [name] ED.  ED Course:   Data reviewed: Blood pressure (!) 141/94, pulse 78, temperature 97.8 F (36.6 C), resp. rate 16, height 5\' 8"  (1.727 m), weight 82 kg, SpO2 99 %.  MRI brain is concerning for "Lytic lesions have been identified previously with this patient. 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 Franky Macho who state the brain lesion is nonoperative recommend oncology consult.  EDP talked to oncology Dr. Candise Che who initially recommended ED to ED transfer as patient received his oncology care at [facility] , however per EDP patient now desires to transfer all his oncologic care to [place]. Oncology recommended to admit to hospitalist and oncology will consult.   Patient received Decadron 10 mg IV x 1 per oncology recommendation, hospitalist contacted to admit the patient.  Per chart review, he was seen in the ED on 5/18, found to have pathologic fracture of her right clavicle, oncology consulted at time recommended outpatient follow-up. Per chart review he also had elevated kappa and lambda  ratio at 25.66 ( this was done yesterday at [facility])      Review of Systems: As per HPI otherwise all other systems reviewed and are negative.   Past Medical History:  Diagnosis Date   Arthritis    CKD (chronic kidney disease) stage 3, GFR 30-59 ml/min (HCC) 20015   Creat 1.9   Hypertension    Multiple myeloma (HCC) 2016   [facility] heme onc   Prostate disorder 02/2017    Past Surgical History:  Procedure Laterality Date   EYE SURGERY Bilateral 01/30/2018   KNEE SURGERY     over ten years ago   NEPHRECTOMY Right 03/2015   ROBOTIC ASSITED PARTIAL NEPHRECTOMY Left 07/07/2021   Procedure: XI ROBOTIC ASSITED PARTIAL NEPHRECTOMY;  Surgeon: [name], MD;  Location: [facility] ORS;  Service: Urology;  Laterality: Left;  5 HRS   XI ROBOTIC ASSISTED SIMPLE PROSTATECTOMY N/A 07/07/2021   Procedure: XI ROBOTIC ASSISTED SIMPLE PROSTATECTOMY;  Surgeon: [name], MD;  Location: [facility];  Service: Urology;  Laterality: N/A;    Social History  reports that he has never smoked. He has never used smokeless tobacco. He reports that he does not drink alcohol and does not use drugs.  Allergies  Allergen Reactions   Penicillins Hives    Has patient had a PCN reaction causing immediate rash, facial/tongue/throat swelling, SOB or lightheadedness with hypotension:Yes Has patient had a PCN reaction causing severe rash involving mucus membranes or skin necrosis: Yes Has patient had a PCN reaction that required hospitalization No Has patient had a PCN reaction occurring within the last 10 years:  No If all of the above answers are "NO", then may proceed with Cephalosporin use.    Latex Rash   Tape Rash    adhesive    Family History  Problem Relation Age of Onset   Diabetes Father    Hypertension Brother     Prior to Admission medications   Medication Sig Start Date End Date Taking? Authorizing Provider  cetirizine (ZYRTEC ALLERGY) 10 MG tablet Take 1 tablet (10 mg total) by mouth  daily for 10 days. 02/04/23 02/14/23  Garrison, [place] N, FNP  docusate sodium (COLACE) 100 MG capsule Take 1 capsule (100 mg total) by mouth 2 (two) times daily. 07/07/21   Harrie Foreman, PA-C  ferrous sulfate 325 (65 FE) MG tablet Take 1 tablet (325 mg total) by mouth 2 (two) times daily with a meal. 05/12/21 08/10/21  [place], Eric J, DO  FINASTERIDE PO Take by mouth.    [provider]  fluticasone (FLONASE) 50 MCG/ACT nasal spray Place 2 sprays into both nostrils daily as needed for allergies. 02/19/21   [provider]  HYDROcodone-acetaminophen (NORCO/VICODIN) 5-325 MG tablet Take 1 tablet by mouth every 6 (six) hours as needed for severe pain. 05/06/23   Smoot, Shawn Route, PA-C  lactulose (CHRONULAC) 10 GM/15ML solution Take 10 g by mouth daily as needed for constipation. 12/07/16   [provider]  tamsulosin (FLOMAX) 0.4 MG CAPS capsule Take 1 capsule (0.4 mg total) by mouth daily after supper. 05/01/21   [name], MD    Physical Exam: Vitals:   05/20/23 1300 05/20/23 1504 05/20/23 1603 05/20/23 1610  BP:  (!) 141/94  130/82  Pulse:  78  79  Resp:  16  16  Temp: 97.8 F (36.6 C)   97.8 F (36.6 C)  TempSrc:    Oral  SpO2:  99%  99%  Weight:   79.1 kg   Height:   5\' 9"  (1.753 m)     Constitutional: NAD, calm, comfortable, right shoulder  limited range of motion Eyes: PERRL, lids and conjunctivae normal, left eye blurry vision ENMT: Mucous membranes are moist.  Respiratory: clear to auscultation bilaterally, no wheezing, no crackles. Normal respiratory effort. No accessory muscle use.  Cardiovascular: Regular rate and rhythm,  No extremity edema. 2+ pedal pulses. No carotid bruits.  Abdomen: no tenderness, not distended, Bowel sounds positive.  Musculoskeletal: no clubbing / cyanosis. No joint deformity upper and lower extremities. Good ROM, no contractures. Normal muscle tone.  Skin: no rashes, lesions, ulcers. No induration Neurologic: CN 2-12 grossly  intact. Sensation intact, Strength 5/5 in all 4.  Psychiatric: Normal judgment and insight. Alert and oriented x 3. Normal mood.    Labs on Admission: I have personally reviewed following labs and imaging studies  CBC: Recent Labs  Lab 05/20/23 1148  WBC 5.3  HGB 11.3*  HCT 34.6*  MCV 89.2  PLT 266    Basic Metabolic Panel: Recent Labs  Lab 05/20/23 1148  NA 129*  K 4.6  CL 98  CO2 24  GLUCOSE 96  BUN 26*  CREATININE 1.87*  CALCIUM 9.0    GFR: Estimated Creatinine Clearance: 33.6 mL/min (A) (by C-G formula based on SCr of 1.87 mg/dL (H)).  Liver Function Tests: No results for input(s): "AST", "ALT", "ALKPHOS", "BILITOT", "PROT", "ALBUMIN" in the last 168 hours.  Urine analysis:    Component Value Date/Time   COLORURINE RED (A) 04/17/2021 1447   APPEARANCEUR TURBID (A) 04/17/2021 1447   LABSPEC  04/17/2021 1447  TEST NOT REPORTED DUE TO COLOR INTERFERENCE OF URINE PIGMENT   PHURINE  04/17/2021 1447    TEST NOT REPORTED DUE TO COLOR INTERFERENCE OF URINE PIGMENT   GLUCOSEU (A) 04/17/2021 1447    TEST NOT REPORTED DUE TO COLOR INTERFERENCE OF URINE PIGMENT   HGBUR (A) 04/17/2021 1447    TEST NOT REPORTED DUE TO COLOR INTERFERENCE OF URINE PIGMENT   BILIRUBINUR (A) 04/17/2021 1447    TEST NOT REPORTED DUE TO COLOR INTERFERENCE OF URINE PIGMENT   KETONESUR (A) 04/17/2021 1447    TEST NOT REPORTED DUE TO COLOR INTERFERENCE OF URINE PIGMENT   PROTEINUR (A) 04/17/2021 1447    TEST NOT REPORTED DUE TO COLOR INTERFERENCE OF URINE PIGMENT   UROBILINOGEN >=8.0 04/16/2021 0932   NITRITE (A) 04/17/2021 1447    TEST NOT REPORTED DUE TO COLOR INTERFERENCE OF URINE PIGMENT   LEUKOCYTESUR (A) 04/17/2021 1447    TEST NOT REPORTED DUE TO COLOR INTERFERENCE OF URINE PIGMENT    Radiological Exams on Admission: MR Brain W and Wo Contrast  Addendum Date: 05/20/2023   ADDENDUM REPORT: 05/20/2023 15:48 ADDENDUM: Lytic lesions have been identified previously with this patient.  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 Electronically Signed   By: Marin Roberts M.D.   On: 05/20/2023 15:48   Result Date: 05/20/2023 CLINICAL DATA:  Visual is turbinates to the left eye. Abnormal CT scan. EXAM: MRI HEAD WITHOUT AND WITH CONTRAST TECHNIQUE: Multiplanar, multiecho pulse sequences of the brain and surrounding structures were obtained without and with intravenous contrast. CONTRAST:  8mL GADAVIST GADOBUTROL 1 MMOL/ML IV SOLN COMPARISON:  CT of the head and maxillofacial 02/25/2021. FINDINGS: Brain: No acute infarct, hemorrhage, or mass lesion is present. Mild atrophy and white matter changes are likely within normal limits for age. The ventricles are of normal size. No significant extraaxial fluid collection is present. Deep brain nuclei are within normal limits. Enhancing osseous tumor extends to the anterior margin of the right IAC. Internal auditory canals are otherwise within normal limits bilaterally. Inner ear structures are within normal limits. Vascular: Insert normal flow Skull and upper cervical spine: A slightly heterogeneous enhancing mass lesion is again noted at the skull base. This extends along the petrous ridge bilaterally, greater on the right. Tumor surrounds the right internal carotid artery without definite stenosis. There is some heterogeneity to the tumor on precontrast T2 weighted imaging. The lesion measures 7.8 x 5.6 x 3.3 cm. Multiple additional enhancing lesions are present throughout the skull. Lesion in the anterior right frontal skull measures 11 mm on image 110 of series 17. A lesion in the left parietal skull on image 107 of series 17 measures 11 mm. Multiple other smaller lesions are present as well. Sinuses/Orbits: Minimal mucosal thickening scratched at minimal fluid is present in the right maxillary sinus. The paranasal sinuses are otherwise clear. A right mastoid effusion is present. The tumor impacts the right eustachian  tube. Other: IMPRESSION: 1. 7.8 x 5.6 x 3.3 cm enhancing mass lesion at the skull base extending along the petrous ridge bilaterally, greater on the right. 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 There are some intrinsic T2 signal scratched at there is some intrinsic T2 signal and chordoma still considered. However, given the multiple other enhancing lesions throughout the skull, this most likely represents metastatic disease. 2. No acute intracranial abnormality. 3. Mild atrophy and white matter changes are likely within normal limits for age. 4. Right mastoid effusion  secondary to obstruction of the eustachian tube. 5. Minimal right maxillary sinus disease. Electronically Signed: By: Marin Roberts M.D. On: 05/20/2023 15:33   MR ORBITS W WO CONTRAST  Result Date: 05/20/2023 CLINICAL DATA:  Visual disturbance to the left eye for 3 weeks. Vision loss, monocular. Skull base mass. EXAM: MRI OF THE ORBITS WITHOUT AND WITH CONTRAST TECHNIQUE: Multiplanar, multi-echo pulse sequences of the orbits and surrounding structures were acquired including fat saturation techniques, before and after intravenous contrast administration. CONTRAST:  8mL GADAVIST GADOBUTROL 1 MMOL/ML IV SOLN COMPARISON:  CT head without contrast 05/20/2023. MR head without and with contrast 05/20/2023 FINDINGS: Enhancing skull base tumor centered in the clivus measuring 7.8 x 5.3 x 3.7 cm. Tumor extends along the petrous apex, right greater than left. The tumor surrounds the petrous right ICA. Tumor extends into the floor of the sella abuts the cavernous sinus bilaterally. Tumor invades the posterior ethmoid air cells and sphenoid sinuses bilaterally. Tumor extends to the left orbital apex and likely impacts the left optic nerve. The optic chiasm is within normal limits. Tumor extends into the nasal cavity more prominently on the left. Multiple additional foci of enhancement are present within the skull. Orbits: Bilateral lens  replacements are present. The globes are within normal limits. The optic nerve is within normal limits within the canal bilaterally. Extraocular muscles are unremarkable. The superior ophthalmic vein is symmetric. Visualized sinuses: Tumor extends into the posterior ethmoid air cells and sphenoid sinuses as described. A right mastoid effusion is secondary to obstruction. Soft tissues: Extracranial soft tissues are unremarkable. Limited intracranial: Visualized intracranial contents are within normal limits. IMPRESSION: 1. Enhancing skull base tumor centered in the clivus measuring 7.8 x 5.3 x 3.7 cm. Tumor extends along the petrous apex, right greater than left. The tumor surrounds the petrous right ICA. Tumor extends into the floor of the sella abuts the cavernous sinus bilaterally. Tumor invades the posterior ethmoid air cells and sphenoid sinuses bilaterally. Tumor extends to the left orbital apex and likely impacts the left optic nerve. Based on the initial CT, consideration of chordoma was made. Although there is some intrinsic T2 signal and heterogeneous enhancement, the multiple other enhancing lesions suggest this is most likely metastatic disease. Tumor extending into the nasal cavity may be assessable for biopsy. 2. Tumor extends into the nasal cavity more prominently on the left. 3. Right mastoid effusion is secondary to obstruction. Electronically Signed   By: Marin Roberts M.D.   On: 05/20/2023 15:41   CT Head Wo Contrast  Result Date: 05/20/2023 CLINICAL DATA:  Vision loss. EXAM: CT HEAD WITHOUT CONTRAST TECHNIQUE: Contiguous axial images were obtained from the base of the skull through the vertex without intravenous contrast. RADIATION DOSE REDUCTION: This exam was performed according to the departmental dose-optimization program which includes automated exposure control, adjustment of the mA and/or kV according to patient size and/or use of iterative reconstruction technique. COMPARISON:   02/25/2021. FINDINGS: Brain: Large, hyperattenuating mass enlarges and erodes the clivus, extending through the sella turcica, obliterating the sphenoid sinuses and extending into the posterior ethmoid air cells. Mass diffusely involves the cavernous sinuses, extending more laterally on the left than on the right. The mass broadly contacts the optic nerves from the orbital apices to anterior to the optic chiasm. Mass measures 6.2 cm from right to left, 4 cm from superior to inferior and 5.2 cm anterior to posterior. No other extra-axial masses. No intra-axial masses. No evidence of an infarct and no intracranial hemorrhage.  No hydrocephalus. Vascular: No hyperdense vessel or unexpected calcification. Mass broadly involves both cavernous sinuses as detailed above. Skull: In addition to the clivus, mass erodes the petrous portions of both temporal bones as well as the sphenoid bone. Sinuses/Orbits: No abnormality of either globe. No intraorbital abnormality. Sinuses unaffected by the mass are clear. Other: None. IMPRESSION: 1. Large mass at the skull base, eroding and expanding the clivus, also involving the sphenoid bone and the petrous portions of both temporal bones. The mass broadly abuts both optic nerves between the chiasm and orbital apices. Mass also broadly involves both cavernous sinuses. 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 Recommend further assessment with brain MRI without and with contrast. 2. No acute finding. No evidence of an infarct, hydrocephalus or intracranial hemorrhage. Electronically Signed   By: Amie Portland M.D.   On: 05/20/2023 10:22      Assessment/Plan Principal Problem:   Brain mass Active Problems:   Hyponatremia   CKD stage 3b, GFR 30-44 ml/min (HCC)   Multiple myeloma in relapse [facility])   Pathologic fracture of right clavicle   Large brain mass -Neurosurgery Dr. Franky Macho states the brain  lesion is nonoperative recommend oncology consult. -Appreciate oncology Dr. Candise Che input and coordinating oncology care  -Continue Decadron, per rad onc Dr Mitzi Hansen "plan to simulation Monday am and plan to start XRT "  Pathologic fracture, right clavicle -Diagnosed on 5/18 -limited range of motion, denies pain, continue sling  Multiple myeloma in relapse With elevated kappa and lambda ratio at 25.66 ( this was done yesterday at [facility]) Hgb, Cr, Ca stable Patient now desires to transfer all oncology care to [place].  Hyponatremia Could be due to SIADH in the setting of malignancy,  Hole lasix, check urine sodium/urine osmole/serum osmole before start hydration He also has elevated BUN, start gentle hydrationx10 hr, repeat BMP in the morning, if sodium decreased on hydration, high urine lites suggest SIADH would do fluids restriction  CKD3b Cr stable at baseline, renal dosing meds, monitor bmp  Normocytic anemia Likely combination of anemia of chronic disease and anemia in malignancy Hemoglobin stable around 11 Monitor  H/o renal cell carcinoma s/p right nephrectomy in 2017, partial left nephrectomy in 06/2021 Does not appear to have metastatic disease  HTN: continue coreg, hold norvasc and micardis  DVT prophylaxis: Lovenox   Code Status:   Full, confirmed with patient and wife at bedside Family Communication:  Wife at bedside  Patient is from: home  Anticipated DC to: Home   Anticipated DC date: >24hrs    Consults called:  EDP talked to neurosurgery Dr. Franky Macho, medical oncology Dr. Candise Che, Rad onc Dr. Mitzi Hansen Admission status:  Inpatient  Severity of Illness:   The appropriate patient status for this patient is INPATIENT due to history and comorbidities, severity of illness, required intensity of service to ensure the patient's safety and to avoid risk of adverse events/further clinical deterioration.  Severity of illness/comorbidities: Brain mass, pathologic  fracture, possible multiple myeloma relapse  Intensity of service: tests, high frequency of surveillance, interventions It is not anticipated that the patient will be medically stable for discharge from the hospital within 2 midnights of admission.    Voice Recognition Reubin Milan dictation system was used to create this note, attempts have been made to correct errors. Please contact the author with questions and/or clarifications.  Albertine Grates MD PhD FACP [facility]  How to  contact the [facility] Attending or Consulting provider 7A - 7P or covering provider during after hours 7P -7A, for this patient?   Check the care team in [facility] and look for a) attending/consulting [facility] provider listed and b) the [facility] team listed Log into [email or web address] and use [facility] universal password to access. If you do not have the password, please contact the hospital operator. Locate the [facility] provider you are looking for under [facility] and page to a number that you can be directly reached. If you still have difficulty reaching the provider, please page the [facility] (Director on Call) for the Hospitalists listed on amion for assistance.  05/20/2023, 6:21 PM

## 2023-05-20 NOTE — ED Notes (Signed)
Patient transported to MRI 

## 2023-05-20 NOTE — Consult Note (Signed)
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 male who has been referred to [place] by Dr Marcus Grates MD for evaluation and management of multiple myeloma with concern for base of skull involvement.  Patient has a history of multiple myeloma, hypertension, chronic kidney disease, arthritis and history of renal cell carcinoma status post right nephrectomy and cryotherapy to left renal mass. Marcus Hatfield Kitchen He was following for his oncologic cares for multiple myeloma with Dr. Rosaria Ferries at [facility] and was last seen for oncology follow-up on 01/05/2023.  Marcus Hatfield was diagnosed with IgG kappa multiple myeloma with standard risk features and stage IIa by ISS criteria in November 2016 when he was being evaluated for renal cell carcinoma. He initially presented to urgent care with hematuria in 2014 and renal ultrasound showed masses on both the kidneys that were suspicious for malignancy.  MRI was done which also confirmed the renal lesions.  Patient was lost to follow-up until September 2016 at which time the MRI of the abdomen showed persistent renal masses and new vertebral lesions at L2 and L4.  He underwent a CT-guided biopsy of L4 on 10/08/2015 that demonstrated plasmacytoma.  He had bone marrow biopsy on 11/04/2015 that demonstrated 10 to 20% plasma cells. 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 Myeloma treatment was deferred till his renal cell carcinoma was treated.  Patient underwent right nephrectomy on 02/03/2016.  He received cryotherapy to his left renal mass in April 2017.  PET CT scan done on 03/30/2016 showed extensive multifocal  hypermetabolic osseous lesions throughout from his multiple myeloma.  His myeloma treatment was as noted below TREATMENT:   05/23/2016: Induction chemotherapy with bortezomib 1.3 mg/m2 weekly and dexamethasone 40 mg weekly using 28-day cycles. Plans to add lenalidomide and Zometa with cycle 2 (06/20/2016).   07/18/2016: Added lenalidomide 10 mg days 1-21 out of 28 days, delayed starting due to insurance. Started lenalidomide on 07/18/2016 with cycle 3. Achieved a PR after 4 cycles of treatment (2 cycles containing lenalidomide).  11/14/2016: Treatment changed to carfilzomib (D1/8/15) and dexamethasone to achieve a deeper response after having plateaued on prior regimen.   01/24/2017: Bone marrow biopsy showed no increased plasma cells but was positive for Congo red stain. Diagnosis of amyloid was made (AL pending confirmation with mass spect).  02/01/2017: Cyclophosphamide added to existing regimen on D1/8/15 to deepen response and due to amyloid deposits found on bone marrow biopsy.  06/19/2017: Transitioned to D1/15 dosing in an attempt to transition to maintenance. His M-spike was 0.2 g/dL, consistent with a PR but close to VGPR.  10/02/2017: Discontinued dexamethasone but continued on cyclophosphamide and carfilzomib after showing persistent stable disease with M-spike 0.29 g/dL.  10/2017: Marcus Hatfield held all therapy starting in 10/2017 since he believed it was not needed. A long discussion was had with him during visit in 04/2018 regarding concern for chemical relapse but he refused to get labs drawn and did not show interest in resuming some type of therapy.   07/16/2018 - 06/11/2019: He achieved an unconfirmed remission (VGPR) when re-assessed and maintained no detectable disease by serology since while on surveillance.  10/07/2019: Routine serology revealed a new M-spike of  0.56 g/dL with IFIX IgG kappa. Patient maintained on surveillance with repeat labs planned for 11/06/2019.   11/06/2019: Confirmed chemical relapse with 2 consecutive positive m-spikes (0.56 and 0.47). A repeat serology from 12/16/19 showed an M-spike of 0.86 and restaging was was performed with bone marrow biopsy and PETCT scan in 12/2019, which showed 3% kappa restricted plasma cells and no evidence of new/active lesions. Patient remained on active surveillance per his request.      MEDICAL HISTORY:  Past Medical History:  Diagnosis Date   Arthritis    CKD (chronic kidney disease) stage 3, GFR 30-59 ml/min (HCC) 20015   Creat 1.9   Hypertension    Multiple myeloma (HCC) 2016   [facility] heme onc   Prostate disorder 02/2017    SURGICAL HISTORY: Past Surgical History:  Procedure Laterality Date   EYE SURGERY Bilateral 01/30/2018   KNEE SURGERY     over ten years ago   NEPHRECTOMY Right 03/2015   ROBOTIC ASSITED PARTIAL NEPHRECTOMY Left 07/07/2021   Procedure: XI ROBOTIC ASSITED PARTIAL NEPHRECTOMY;  Surgeon: [name], MD;  Location: [facility];  Service: Urology;  Laterality: Left;  5 HRS   XI ROBOTIC ASSISTED SIMPLE PROSTATECTOMY N/A 07/07/2021   Procedure: XI ROBOTIC ASSISTED SIMPLE PROSTATECTOMY;  Surgeon: [name], MD;  Location: [facility];  Service: Urology;  Laterality: N/A;    SOCIAL HISTORY: Social History   Socioeconomic History   Marital status: Married    Spouse name: Museum/gallery curator   Number of children: 2   Years of education: Not on file   Highest education level: Not on file  Occupational History   Occupation: Retired  Tobacco Use   Smoking status: Never   Smokeless tobacco: Never  Vaping Use   Vaping Use: Never used  Substance and Sexual Activity   Alcohol use: No   Drug use: No   Sexual activity: Not on file  Other Topics Concern   Not on file  Social History Narrative   Not on file   Social Determinants of Health   Financial Resource Strain: Not on file  Food Insecurity: Not on file  Transportation Needs: Not on file  Physical Activity: Not on  file  Stress: Not on file  Social Connections: Not on file  Intimate Partner Violence: Not on file  reports that he has never smoked. He has never used smokeless tobacco. He reports that he does not drink alcohol and does not use drugs. he walks on a daily basis for exercise. He is married, lives with wife, has no pets, is a retired Primary school teacher, and has 13 siblings. For hobbies, he enjoys fixing cars and gardening. He does not follow any particular religious belief.   FAMILY HISTORY: Family History  Problem Relation Age of Onset   Diabetes Father    Hypertension Brother   family history includes Cancer (age of onset: 75) in his brother and brother; Cancer (age of onset: 31) in his father; Heart disease in his mother; Hypertension in his father. No other family history of leukemia, lymphoma, or myeloma.   ALLERGIES:  is allergic to penicillins and latex.  MEDICATIONS:  No current facility-administered medications for this encounter.   Current Outpatient Medications  Medication Sig Dispense Refill   cetirizine (ZYRTEC ALLERGY) 10 MG tablet Take 1 tablet (10 mg total) by mouth daily for 10 days. 10 tablet 0   docusate sodium (COLACE) 100 MG capsule Take 1 capsule (100 mg total) by mouth 2 (two) times daily.  ferrous sulfate 325 (65 FE) MG tablet Take 1 tablet (325 mg total) by mouth 2 (two) times daily with a meal. 180 tablet 0   FINASTERIDE PO Take by mouth.     fluticasone (FLONASE) 50 MCG/ACT nasal spray Place 2 sprays into both nostrils daily as needed for allergies.     HYDROcodone-acetaminophen (NORCO/VICODIN) 5-325 MG tablet Take 1 tablet by mouth every 6 (six) hours as needed for severe pain. 10 tablet 0   lactulose (CHRONULAC) 10 GM/15ML solution Take 10 g by mouth daily as needed for constipation.  5   tamsulosin (FLOMAX) 0.4 MG CAPS capsule Take 1 capsule (0.4 mg total) by mouth daily after supper. 30 capsule 1    REVIEW OF SYSTEMS:    10 Point review of Systems  was done is negative except as noted above.  PHYSICAL EXAMINATION: ECOG PERFORMANCE STATUS: 2 - Symptomatic, <50% confined to bed  . Vitals:   05/20/23 1300 05/20/23 1504  BP:  (!) 141/94  Pulse:  78  Resp:  16  Temp: 97.8 F (36.6 C)   SpO2:  99%   Filed Weights   05/20/23 0946  Weight: 180 lb 12.4 oz (82 kg)   .Body mass index is 27.49 kg/m.  GENERAL:alert, in no acute distress and comfortable SKIN: no acute rashes, no significant lesions EYES: conjunctiva are pink and non-injected, sclera anicteric OROPHARYNX: MMM, no exudates, no oropharyngeal erythema or ulceration NECK: supple, no JVD LYMPH:  no palpable lymphadenopathy in the cervical, axillary or inguinal regions LUNGS: clear to auscultation b/l with normal respiratory effort HEART: regular rate & rhythm ABDOMEN:  normoactive bowel sounds , non tender, not distended. Extremity: no pedal edema PSYCH: alert & oriented x 3 with fluent speech NEURO: no focal motor/sensory deficits  LABORATORY DATA:  I have reviewed the data as listed  .    Latest Ref Rng & Units 05/20/2023   11:48 AM 07/10/2021    8:49 AM 07/09/2021    8:18 PM  CBC  WBC 4.0 - 10.5 K/uL 5.3     Hemoglobin 13.0 - 17.0 g/dL 82.9  8.7  8.8   Hematocrit 39.0 - 52.0 % 34.6  27.0  27.6   Platelets 150 - 400 K/uL 266       .    Latest Ref Rng & Units 05/20/2023   11:48 AM 07/09/2021    4:24 AM 07/08/2021    5:09 AM  CMP  Glucose 70 - 99 mg/dL 96  562  130   BUN 8 - 23 mg/dL 26  21  23    Creatinine 0.61 - 1.24 mg/dL 8.65  7.84  6.96   Sodium 135 - 145 mmol/L 129  135  136   Potassium 3.5 - 5.1 mmol/L 4.6  4.6  4.9   Chloride 98 - 111 mmol/L 98  109  103   CO2 22 - 32 mmol/L 24  22  21    Calcium 8.9 - 10.3 mg/dL 9.0  7.5  8.1     Renal biopsy left kidney mass (11/04/2015): papillary renal cell carcinoma reactive for CK7, CD10, RCC, P504s.  Bone marrow biopsy (11/04/2015): plasma cell neoplasm comprising 10-12% of cellularity with flow showing  kappa restriction. FISH was negative for mutations.  L4 vertebral biopsy (10/08/2015): bone marrow involvement by plasma cell neoplasm comprising 90% of marrow (kappa restricted).  Bone marrow biopsy (01/24/2017): Normocellular marrow (40%) with multilineage hematopoiesis and no increase in plasma cells. Scant intramural amyloid deposition involving medium-sized marrow vessels. FISH  negative.  Bone marrow biopsy (01/17/2020, [id number]):  Normocellular bone marrow with normal trilineage hematopoiesis and approximately 3% kappa-restricted plasma cells. CG normal. FISH myeloma negative.   RADIOGRAPHIC STUDIES: I have personally reviewed the radiological images as listed and agreed with the findings in the report.  MRI abdomen (09/18/2015): enlargement of the enhancing right renal cortical mass most consistent with enlarging papillary renal cell carcinoma. Left lower pole lesion consistent with enlarging papillary renal cell carcinoma. thrid medial lower pole lesion similar in comparison to prior scan. Masses are clear of the renal veins. Two new enhancing lesions in lumbar spine concerning for metastases in L2 and L4.  NM bone scan whole body (10/06/2015): stable foci of abnormal uptake within scattered ribs lilaterally and the right ischium when compared to scan from 08/22/13.  PET/CT scan (03/30/2016): Multifocal hypermetabolic lytic lesions throughout the entire cervicothoracolumbar spine with the largest lesions involving the majority of the L4 vertebral body. Additional subcentimeter lytic lesions identified throughout the spine are below the resolution for PET CT, but concerning for additional sites. Multifocal hypermetabolic lytic lesions throughout the pelvis, with the largest lesion involving the inferior right pubic rami which appears to extend through both cortices. Multifocal hypermetabolic lytic lesions throughout both femurs including the femoral head, neck, and mid diaphysis. Hypermetabolic  activity associated with the right fifth sixth and seventh posterior ribs, and left first, third, fourth, first, third, fifth, seventh, and eighth posterior ribs. Multifocal hypermetabolic lesions in both humeri.  XR bone densitometry (08/15/2016): Osteopenia. Fracture Risk: Trabecular bone score (TBS) adjusted WHO FRAX tool estimates 10-year probability of fracture at 3.9% for major osteoporotic and 0.7% for hip fracture.  XR bone densitometry (02/18/2019): Low bone mass by WHO criteria.  PET/CT scan (01/17/2020): Findings reflective of interval treatment response of previously seen hypermetabolic lytic lesions of the axial and appendicular skeleton with mild residual uptake noted in T6 vertebral body lesion.  Multiple variable sized lytic lesions appear relatively similar in size to 2017 exam with few of the larger lesions showing interval development of areas of sclerosis.  No FDG avid areas identified in the cryoablation bed of the left kidney. Continued attention on follow-up. Interval increase in size of right lateral wall hernia containing small bowel and proximal ascending colon with minimal FDG uptake in this region which could be secondary to inflammatory process. Ancillary CT findings as above.  Renal US (02/05/20): 2.3 cm solid lesion arising from the lower pole of the left kidney, concerning for neoplasm. Further evaluation with a renal protocol MRI is recommended. Multiple left renal cysts. Status post right nephrectomy. Marked enlargement and heterogeneity of the prostate.  MR abdomen WWO contrast (10/13/2020): Enhancing mass in the lower pole of the left kidney measuring up to 2.5 cm concerning for renal cell carcinoma. Posttreatment changes of the lower pole of the left kidney without enhancing residual soft tissue. Multifocal treated osseous lesions within the axial skeleton better demonstrated on prior PET/CT. 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 No worrisome imaging features or high risk stigmata.  MR Brain W and Wo Contrast  Result Date: 05/20/2023 CLINICAL DATA:  Visual is turbinates to the left eye. Abnormal CT scan. EXAM: MRI HEAD WITHOUT AND WITH CONTRAST TECHNIQUE: Multiplanar, multiecho pulse sequences of the brain and surrounding structures were obtained without and with intravenous contrast. CONTRAST:  8mL GADAVIST GADOBUTROL 1 MMOL/ML IV SOLN COMPARISON:  CT of the head and maxillofacial 02/25/2021. FINDINGS: Brain: No acute infarct, hemorrhage, or  mass lesion is present. Mild atrophy and white matter changes are likely within normal limits for age. The ventricles are of normal size. No significant extraaxial fluid collection is present. Deep brain nuclei are within normal limits. Enhancing osseous tumor extends to the anterior margin of the right IAC. Internal auditory canals are otherwise within normal limits bilaterally. Inner ear structures are within normal limits. Vascular: Insert normal flow Skull and upper cervical spine: A slightly heterogeneous enhancing mass lesion is again noted at the skull base. This extends along the petrous ridge bilaterally, greater on the right. Tumor surrounds the right internal carotid artery without definite stenosis. There is some heterogeneity to the tumor on precontrast T2 weighted imaging. The lesion measures 7.8 x 5.6 x 3.3 cm. Multiple additional enhancing lesions are present throughout the skull. Lesion in the anterior right frontal skull measures 11 mm on image 110 of series 17. A lesion in the left parietal skull on image 107 of series 17 measures 11 mm. Multiple other smaller lesions are present as well. Sinuses/Orbits: Minimal mucosal thickening scratched at minimal fluid is present in the right maxillary sinus. The paranasal sinuses are otherwise clear. A right mastoid effusion is present. The tumor impacts the right eustachian tube. Other: IMPRESSION: 1. 7.8 x 5.6 x 3.3 cm enhancing mass  lesion at the skull base extending along the petrous ridge bilaterally, greater on the right. 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 There are some intrinsic T2 signal scratched at there is some intrinsic T2 signal and chordoma still considered. However, given the multiple other enhancing lesions throughout the skull, this most likely represents metastatic disease. 2. No acute intracranial abnormality. 3. Mild atrophy and white matter changes are likely within normal limits for age. 4. Right mastoid effusion secondary to obstruction of the eustachian tube. 5. Minimal right maxillary sinus disease. Electronically Signed   By: Marin Roberts M.D.   On: 05/20/2023 15:33   CT Head Wo Contrast  Result Date: 05/20/2023 CLINICAL DATA:  Vision loss. EXAM: CT HEAD WITHOUT CONTRAST TECHNIQUE: Contiguous axial images were obtained from the base of the skull through the vertex without intravenous contrast. RADIATION DOSE REDUCTION: This exam was performed according to the departmental dose-optimization program which includes automated exposure control, adjustment of the mA and/or kV according to patient size and/or use of iterative reconstruction technique. COMPARISON:  02/25/2021. FINDINGS: Brain: Large, hyperattenuating mass enlarges and erodes the clivus, extending through the sella turcica, obliterating the sphenoid sinuses and extending into the posterior ethmoid air cells. Mass diffusely involves the cavernous sinuses, extending more laterally on the left than on the right. The mass broadly contacts the optic nerves from the orbital apices to anterior to the optic chiasm. Mass measures 6.2 cm from right to left, 4 cm from superior to inferior and 5.2 cm anterior to posterior. No other extra-axial masses. No intra-axial masses. No evidence of an infarct and no intracranial hemorrhage. No hydrocephalus. Vascular: No hyperdense vessel or unexpected calcification. Mass broadly involves both cavernous  sinuses as detailed above. Skull: In addition to the clivus, mass erodes the petrous portions of both temporal bones as well as the sphenoid bone. Sinuses/Orbits: No abnormality of either globe. No intraorbital abnormality. Sinuses unaffected by the mass are clear. Other: None. IMPRESSION: 1. Large mass at the skull base, eroding and expanding the clivus, also involving the sphenoid bone and the petrous portions of both temporal bones. The mass broadly abuts both optic nerves between the chiasm and orbital apices.  Mass also broadly involves both cavernous sinuses. 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 Recommend further assessment with brain MRI without and with contrast. 2. No acute finding. No evidence of an infarct, hydrocephalus or intracranial hemorrhage. Electronically Signed   By: Amie Portland M.D.   On: 05/20/2023 10:22   DG Chest 2 View  Result Date: 05/06/2023 CLINICAL DATA:  77 year old male with history of right clavicular pain. EXAM: CHEST - 2 VIEW COMPARISON:  Chest x-ray 05/02/2023. FINDINGS: Mildly displaced fracture of the medial right clavicle. Lucency in the region of the medial right clavicle suggesting an underlying bony lesion. Old healed fractures of the lateral right fifth and sixth ribs with abundant callus formation. Lung volumes are normal. No consolidative airspace disease. No pleural effusions. No pneumothorax. No pulmonary nodule or mass noted. Pulmonary vasculature and the cardiomediastinal silhouette are within normal limits. IMPRESSION: 1.  No radiographic evidence of acute cardiopulmonary disease. 2. Probable pathologic fracture of the medial right clavicle better demonstrated on dedicated right clavicular radiographs. 3. Old healed fractures of the lateral right fifth and sixth ribs. Electronically Signed   By: Trudie Reed M.D.   On: 05/06/2023 09:56   DG Clavicle Right  Result Date:  05/06/2023 CLINICAL DATA:  77 year old male with history of pain in the region of the right clavicle. EXAM: RIGHT CLAVICLE - 2+ VIEWS COMPARISON:  No priors. FINDINGS: Multiple lucent lesions in the right clavicle, which could reflect metastatic lesions in this patient with history of renal cell carcinoma, or could be myelomatous lesions. Mildly displaced fracture of the medial right clavicle noted with approximately 8 mm of inferior displacement of the distal aspect of the clavicle. IMPRESSION: 1. Mildly displaced likely pathologic fracture of the medial right clavicle, as above. Multiple additional lytic lesions in the clavicle could represent myelomatous lesions or metastatic lesions in this patient with history of renal cell carcinoma. Electronically Signed   By: Trudie Reed M.D.   On: 05/06/2023 09:54   DG Shoulder Right  Result Date: 05/06/2023 CLINICAL DATA:  Right shoulder pain. EXAM: RIGHT SHOULDER - 3 VIEW COMPARISON:  None Available. FINDINGS: No acute fracture or dislocation. Degenerative spurring at the acromioclavicular joint. Few subtle lucent areas are seen over the humeral diaphysis (with endosteal scalloping) and at the mid clavicle. Remote fifth and sixth rib fractures with hypertrophic callus. There is known history of multiple myeloma with lesion seen on chest CT from 09/26/2022 IMPRESSION: 1. No acute finding. 2. Known myeloma. Electronically Signed   By: Tiburcio Pea M.D.   On: 05/06/2023 08:41   DG Chest 2 View  Result Date: 05/05/2023 CLINICAL DATA:  chest xray EXAM: CHEST - 2 VIEW COMPARISON:  04/19/2022 FINDINGS: Cardiac silhouette is unremarkable. No pneumothorax or pleural effusion. The lungs are clear. Thoracic degenerative changes noted. Chronic posterior right-sided fifth and sixth rib deformities noted. IMPRESSION: No acute cardiopulmonary process. Electronically Signed   By: Layla Maw M.D.   On: 05/05/2023 22:27    ASSESSMENT & PLAN:   77 year old male  with  #1 Progressive IgG kappa myeloma: Initially diagnosed in 2016 as stage II disease with standard risk cytogenetics today. 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 Status post treatment as noted above. Last seen by Dr. Lalla Brothers at [facility] in January 2024 and noted to have progressive disease with increase in his IgG kappa M spike at 2.87 g/dL.  Recent light chains done on 05/20/2023 at [facility] showed increase in free kappa light chains to 603 with a kappa lambda ratio of 25 this is up from kappa light chains of 402 with a kappa lambda ratio of 12.6 on 01/03/2023. CBC shows relatively stable hemoglobin of 11.3 with normal WBC count and platelets BMP shows stable chronic kidney disease with a creatinine of 1.87.  No overt hypercalcemia with a calcium of 9  #2 recent right clavicular pain with x-ray on 05/06/2023 showing mildly displaced likely pathologic fracture of the medial right clavicle.  Multiple other lytic lesions noted.  #3 current hospitalization with visual difficulty and loss of vision referred to the emergency room by his ophthalmologist. CT head done today 05/20/2023 shows  Large mass at the skull base, eroding and expanding the clivus, also involving the sphenoid bone and the petrous portions of both temporal bones. The mass broadly abuts both optic nerves between the chiasm and orbital apices. Mass also broadly involves both cavernous sinuses. 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 Recommend further assessment with brain MRI without and with contrast.  MRI brain 05/20/2023 1. 7.8 x 5.6 x 3.3 cm enhancing mass lesion at the skull base extending along the petrous ridge bilaterally, greater on the right. 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 There are some intrinsic T2 signal scratched at there is some intrinsic T2 signal and chordoma still  considered. However, given the multiple other enhancing lesions throughout the skull, this most likely represents metastatic disease. 2. No acute intracranial abnormality. 3. Mild atrophy and white matter changes are likely within normal limits for age. 4. Right mastoid effusion secondary to obstruction of the eustachian tube. 5. Minimal right maxillary sinus disease.  ADDENDUM: Lytic lesions have been identified previously with this patient. 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   #4 hyponatremia  #5 history of renal cell carcinoma right nephrectomy on 02/03/2016.  He received cryotherapy to his left renal mass in April 2017. Surgically resected by Dr. [name] at [facility] [facility] on July 07, 2021. 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 Tumor is limited to the kidney (pT1a).  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 -Patient off multiple myeloma treatments for several years and did have overt myeloma progression in January 2024 when he last met his oncologist.  At that time he wanted to defer any further treatments and was to be seen back in about 4 to 6 months. -He notes that his previous treatments were somewhat bothersome and he got frustrated and decided to hold off on treatments. -I had a detailed goals of care discussion with the patient.  He notes he lives in [place] and would like to be treated locally.  He would like to pursue  palliative radiation for his base of the skull mass if it is recommended. -We have consulted radiation oncology. -Patient  has received dexamethasone 10 mg IV x 1 and has been started on dexamethasone 5 mg every 6 hours. -Will recommend getting a CT chest abdomen pelvis without IV contrast to evaluate for other evidence of extraosseous metastatic disease which might bring up the possibility of renal cell carcinoma though less likely.  His RCC was treated with local interventions for renal masses. -On labs today the patient does not appear to have any significant anemia, progression of his chronic kidney disease or hypercalcemia. -Reasonable to get a bone survey to get a sense of the extent of his bone lesions though he will still likely need outpatient PET CT scan. -We discussed options for systemic therapies to control the rest of his myeloma including starting him on daratumumab dexamethasone and Revlimid. -He is having some pain in the right clavicle from his pathologic fracture.  It would not be unreasonable to consider biopsy of this lesion if possible safely to confirm that his broader metastatic disease is indeed related to multiple myeloma and not from renal cell carcinoma or other etiology though less likely. -Oncology will continue to follow -The patient excellent care by 6 E. nursing staff and hospital medicine team   All of the patients questions were answered with apparent satisfaction. The patient knows to call the clinic with any problems, questions or concerns.. The total time spent in the appointment was 90 minutes* .  All of the patient's questions were answered with apparent satisfaction. The patient knows to call the clinic with any problems, questions or concerns.   Wyvonnia Lora MD MS AAHIVMS [facility] Hematology/Oncology Physician [facility]  .*Total Encounter Time as defined by the Centers for Medicare and Medicaid Services includes, in addition to the  face-to-face time of a patient visit (documented in the note above) non-face-to-face time: obtaining and reviewing outside history, ordering and reviewing medications, tests or procedures, care coordination (communications with other health care professionals or caregivers) and documentation in the medical record.   05/20/2023 3:43 PM

## 2023-05-20 NOTE — ED Provider Notes (Signed)
[place] EMERGENCY DEPARTMENT AT [facility] Provider Note   CSN: [id number] Arrival date & time: 05/20/23  6295     History  Chief Complaint  Patient presents with   Eye Problem    Marcus Hatfield is a 77 y.o. male with history of HTN, CKD, multiple myeloma who presents to the ER complaining of progressive left sided vision loss for 3 weeks or so. Been having some sinus congestion/pressure as well. Went to the eye doctor today who got an outpatient CT scan done that was concerning, was told to go to the ER for further evaluation and MRI.    Eye Problem      Home Medications Prior to Admission medications   Medication Sig Start Date End Date Taking? Authorizing Provider  cetirizine (ZYRTEC ALLERGY) 10 MG tablet Take 1 tablet (10 mg total) by mouth daily for 10 days. 02/04/23 02/14/23  Garrison, [place] N, FNP  docusate sodium (COLACE) 100 MG capsule Take 1 capsule (100 mg total) by mouth 2 (two) times daily. 07/07/21   Harrie Foreman, PA-C  ferrous sulfate 325 (65 FE) MG tablet Take 1 tablet (325 mg total) by mouth 2 (two) times daily with a meal. 05/12/21 08/10/21  [place], Eric J, DO  FINASTERIDE PO Take by mouth.    [provider]  fluticasone (FLONASE) 50 MCG/ACT nasal spray Place 2 sprays into both nostrils daily as needed for allergies. 02/19/21   [provider]  HYDROcodone-acetaminophen (NORCO/VICODIN) 5-325 MG tablet Take 1 tablet by mouth every 6 (six) hours as needed for severe pain. 05/06/23   Smoot, Shawn Route, PA-C  lactulose (CHRONULAC) 10 GM/15ML solution Take 10 g by mouth daily as needed for constipation. 12/07/16   [provider]  tamsulosin (FLOMAX) 0.4 MG CAPS capsule Take 1 capsule (0.4 mg total) by mouth daily after supper. 05/01/21   [name], MD      Allergies    Penicillins and Latex    Review of Systems   Review of Systems  Eyes:  Positive for visual disturbance.  All other systems reviewed and are  negative.   Physical Exam Updated Vital Signs BP (!) 141/94 (BP Location: Left Arm)   Pulse 78   Temp 97.8 F (36.6 C)   Resp 16   Ht 5\' 8"  (1.727 m)   Wt 82 kg   SpO2 99%   BMI 27.49 kg/m  Physical Exam Vitals and nursing note reviewed.  Constitutional:      Appearance: Normal appearance.  HENT:     Head: Normocephalic and atraumatic.  Eyes:     Conjunctiva/sclera: Conjunctivae normal.     Comments: Visual acuity Bilateral: 20/800 Left: 20/800 Right: 20/100  Pulmonary:     Effort: Pulmonary effort is normal. No respiratory distress.  Skin:    General: Skin is warm and dry.  Neurological:     Mental Status: He is alert.     Gait: Gait abnormal.     Comments: Neuro: Speech is clear, able to follow commands. CN III-XII grossly intact. PERRLA. EOMI. Sensation intact throughout. Str 5/5 all extremities.  Psychiatric:        Mood and Affect: Mood normal.        Behavior: Behavior normal.     ED Results / Procedures / Treatments   Labs (all labs ordered are listed, but only abnormal results are displayed) Labs Reviewed  CBC - Abnormal; Notable for the following components:      Result Value   RBC  3.88 (*)    Hemoglobin 11.3 (*)    HCT 34.6 (*)    All other components within normal limits  BASIC METABOLIC PANEL - Abnormal; Notable for the following components:   Sodium 129 (*)    BUN 26 (*)    Creatinine, Ser 1.87 (*)    GFR, Estimated 37 (*)    All other components within normal limits    EKG None  Radiology CT Head Wo Contrast  Result Date: 05/20/2023 CLINICAL DATA:  Vision loss. EXAM: CT HEAD WITHOUT CONTRAST TECHNIQUE: Contiguous axial images were obtained from the base of the skull through the vertex without intravenous contrast. RADIATION DOSE REDUCTION: This exam was performed according to the departmental dose-optimization program which includes automated exposure control, adjustment of the mA and/or kV according to patient size and/or use of iterative  reconstruction technique. COMPARISON:  02/25/2021. FINDINGS: Brain: Large, hyperattenuating mass enlarges and erodes the clivus, extending through the sella turcica, obliterating the sphenoid sinuses and extending into the posterior ethmoid air cells. Mass diffusely involves the cavernous sinuses, extending more laterally on the left than on the right. The mass broadly contacts the optic nerves from the orbital apices to anterior to the optic chiasm. Mass measures 6.2 cm from right to left, 4 cm from superior to inferior and 5.2 cm anterior to posterior. No other extra-axial masses. No intra-axial masses. No evidence of an infarct and no intracranial hemorrhage. No hydrocephalus. Vascular: No hyperdense vessel or unexpected calcification. Mass broadly involves both cavernous sinuses as detailed above. Skull: In addition to the clivus, mass erodes the petrous portions of both temporal bones as well as the sphenoid bone. Sinuses/Orbits: No abnormality of either globe. No intraorbital abnormality. Sinuses unaffected by the mass are clear. Other: None. IMPRESSION: 1. Large mass at the skull base, eroding and expanding the clivus, also involving the sphenoid bone and the petrous portions of both temporal bones. The mass broadly abuts both optic nerves between the chiasm and orbital apices. Mass also broadly involves both cavernous sinuses. 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 Recommend further assessment with brain MRI without and with contrast. 2. No acute finding. No evidence of an infarct, hydrocephalus or intracranial hemorrhage. Electronically Signed   By: Amie Portland M.D.   On: 05/20/2023 10:22    Procedures .Critical Care  Performed by: Su Monks, PA-C Authorized by: Su Monks, PA-C   Critical care provider statement:    Critical care time (minutes):  30   Critical care was necessary to treat or  prevent imminent or life-threatening deterioration of the following conditions:  CNS failure or compromise   Critical care was time spent personally by me on the following activities:  Development of treatment plan with patient or surrogate, discussions with consultants, evaluation of patient's response to treatment, examination of patient, ordering and review of laboratory studies, ordering and review of radiographic studies, ordering and performing treatments and interventions, pulse oximetry, re-evaluation of patient's condition and review of old charts   Care discussed with comment:  Neurosurgery, oncology     Medications Ordered in ED Medications  LORazepam (ATIVAN) injection 1 mg (1 mg Intravenous Given 05/20/23 1148)  dexamethasone (DECADRON) injection 10 mg (10 mg Intravenous Given 05/20/23 1353)  gadobutrol (GADAVIST) 1 MMOL/ML injection 8 mL (8 mLs Intravenous Contrast Given 05/20/23 1448)    ED Course/ Medical Decision Making/ A&P  Medical Decision Making Amount and/or Complexity of Data Reviewed Labs: ordered. Radiology: ordered.  Risk Prescription drug management. Decision regarding hospitalization.   This patient is a 77 y.o. male  who presents to the ED for concern of progressive L eye vision loss. Sent from eye doctor for MRI after concerning CT.    Past Medical History / Co-morbidities / Social History: HTN, CKD, multiple myeloma   Additional history: Chart reviewed. Pertinent results include: cannot review outpatient imaging. Dr Randon Goldsmith with ophthalmology called attending physician ahead of time to provide patient update.   Physical Exam: Physical exam performed. The pertinent findings include: Significant left eye visual deficit on visual acuity compared to right. Unsteady gait. CN exam otherwise grossly unremarkable.   Lab Tests/Imaging studies: I personally interpreted labs/imaging and the pertinent results include:  CBC unremarkable.  Sodium 129, creatinine somewhat improved compared to prior.    CT head with large mass at skull base approximately 6.2 x 4 x 5.2 cm eroding the clivus and involving the sinuses, sphenoid bone, and temporal bones. I agree with the radiologist interpretation.  MRI brain and orbits pending at time of admission.   Medications: I ordered medication including decadron.  I have reviewed the patients home medicines and have made adjustments as needed.  Consultations obtained: I consulted with neurosurgeon Dr. Franky Macho, who reviewed the patients images.  He is unsure if the images a chordoma like the radiologist suspected, but he does not feel any surgical intervention.  He recommended ENT perform a biopsy.  I consulted with oncologist Dr. Candise Che who reviewed the patient's images and recommended that we initiate 10 mg Decadron IV.  He believes that patient will require further evaluation and likely radiation therapy for this mass.  He encouraged to share decision-making conversation about whether or not the patient would have his care done with his primary oncology team at [facility], or if he would transfer to the [facility] health team.  I consulted with hospitalist Dr Roda Shutters who will admit.    Disposition: After consideration of the diagnostic results and the patients response to treatment, I feel that patient would benefit from admission for IV steroids and radiation oncology evaluation.  I discussed this case with my attending physician Dr. Rodena Medin who cosigned this note including patient's presenting symptoms, physical exam, and planned diagnostics and interventions. Attending physician stated agreement with plan or made changes to plan which were implemented.   Final Clinical Impression(s) / ED Diagnoses Final diagnoses:  Brain mass  Vision loss of left eye    Rx / DC Orders ED Discharge Orders     None      Portions of this report may have been transcribed using voice recognition  software. Every effort was made to ensure accuracy; however, inadvertent computerized transcription errors may be present.    Jeanella Flattery 05/20/23 1523    Wynetta Fines, MD 05/20/23 [phone]

## 2023-05-20 NOTE — ED Notes (Signed)
ED TO INPATIENT HANDOFF REPORT  ED Nurse Name and Phone #: Crist Infante RN [phone]  S Name/Age/Gender Marcus Hatfield 77 y.o. male Room/Bed: WA13/WA13  Code Status   Code Status: Prior  Home/SNF/Other Home Patient oriented to: self, place, time, and situation Is this baseline? Yes   Triage Complete: Triage complete  Chief Complaint Brain mass [G93.89]  Triage Note Pt states eye doctor requested for pt to have MRI due to vision changes to left eye, "it's been going on for three weeks"   Allergies Allergies  Allergen Reactions   Penicillins Hives    Has patient had a PCN reaction causing immediate rash, facial/tongue/throat swelling, SOB or lightheadedness with hypotension:Yes Has patient had a PCN reaction causing severe rash involving mucus membranes or skin necrosis: Yes Has patient had a PCN reaction that required hospitalization No Has patient had a PCN reaction occurring within the last 10 years: No If all of the above answers are "NO", then may proceed with Cephalosporin use.    Latex Rash    Level of Care/Admitting Diagnosis ED Disposition     ED Disposition  Admit   Condition  --   Comment  [facility] [[id number]]  Level of Care: Med-Surg [16]  May admit patient to [facility] or [facility] if equivalent level of care is available:: No  Covid Evaluation: Confirmed COVID Negative  Diagnosis: Brain mass [454098]  Admitting Physician: Albertine Grates [[id number]]  Attending Physician: Albertine Grates [[id number]]  Certification:: I certify this patient will need inpatient services for at least 2 midnights  Estimated Length of Stay: 2          B Medical/Surgery History Past Medical History:  Diagnosis Date   Arthritis    CKD (chronic kidney disease) stage 3, GFR 30-59 ml/min (HCC) 20015   Creat 1.9   Hypertension    Multiple myeloma (HCC) 2016   [facility] heme onc   Prostate disorder 02/2017   Past Surgical History:  Procedure Laterality Date    EYE SURGERY Bilateral 01/30/2018   KNEE SURGERY     over ten years ago   NEPHRECTOMY Right 03/2015   ROBOTIC ASSITED PARTIAL NEPHRECTOMY Left 07/07/2021   Procedure: XI ROBOTIC ASSITED PARTIAL NEPHRECTOMY;  Surgeon: [name], MD;  Location: [facility];  Service: Urology;  Laterality: Left;  5 HRS   XI ROBOTIC ASSISTED SIMPLE PROSTATECTOMY N/A 07/07/2021   Procedure: XI ROBOTIC ASSISTED SIMPLE PROSTATECTOMY;  Surgeon: [name], MD;  Location: [facility];  Service: Urology;  Laterality: N/A;     A IV Location/Drains/Wounds Patient Lines/Drains/Airways Status     Active Line/Drains/Airways     Name Placement date Placement time Site Days   Peripheral IV 05/20/23 20 G 1" Anterior;Distal;Right;Upper Arm 05/20/23  1522  Arm  less than 1   Urethral Catheter Dr Berneice Heinrich Straight-tip;Non-latex;Triple-lumen 24 Fr. 07/07/21  1346  Straight-tip;Non-latex;Triple-lumen  682   Incision - 2 Ports Abdomen 1: Left;Lateral;Upper 2: Left;Lateral;Mid 07/07/21  0913  -- 682   Incision - 6 Ports Abdomen 1: Umbilicus;Upper 2: Umbilicus;Superior 3: Right;Lateral;Upper 4: Right;Medial;Upper 5: Right;Lateral;Lower 6: Left;Medial;Upper 07/07/21  0913  -- 682            Intake/Output Last 24 hours No intake or output data in the 24 hours ending 05/20/23 1533  Labs/Imaging Results for orders placed or performed during the hospital encounter of 05/20/23 (from the past 48 hour(s))  CBC     Status: Abnormal   Collection Time: 05/20/23 11:48 AM  Result Value Ref Range   WBC 5.3 4.0 - 10.5 K/uL   RBC 3.88 (L) 4.22 - 5.81 MIL/uL   Hemoglobin 11.3 (L) 13.0 - 17.0 g/dL   HCT 45.4 (L) 09.8 - 11.9 %   MCV 89.2 80.0 - 100.0 fL   MCH 29.1 26.0 - 34.0 pg   MCHC 32.7 30.0 - 36.0 g/dL   RDW 14.7 82.9 - 56.2 %   Platelets 266 150 - 400 K/uL   nRBC 0.0 0.0 - 0.2 %    Comment: Performed at [facility], [address] [place], [place] [address]  Basic metabolic panel     Status: Abnormal    Collection Time: 05/20/23 11:48 AM  Result Value Ref Range   Sodium 129 (L) 135 - 145 mmol/L   Potassium 4.6 3.5 - 5.1 mmol/L   Chloride 98 98 - 111 mmol/L   CO2 24 22 - 32 mmol/L   Glucose, Bld 96 70 - 99 mg/dL    Comment: Glucose reference range applies only to samples taken after fasting for at least 8 hours.   BUN 26 (H) 8 - 23 mg/dL   Creatinine, Ser 5.78 (H) 0.61 - 1.24 mg/dL   Calcium 9.0 8.9 - 46.9 mg/dL   GFR, Estimated 37 (L) >60 mL/min    Comment: (NOTE) Calculated using the CKD-EPI Creatinine Equation (2021)    Anion gap 7 5 - 15    Comment: Performed at [facility], [address], [place], [place] [address]   CT Head Wo Contrast  Result Date: 05/20/2023 CLINICAL DATA:  Vision loss. EXAM: CT HEAD WITHOUT CONTRAST TECHNIQUE: Contiguous axial images were obtained from the base of the skull through the vertex without intravenous contrast. RADIATION DOSE REDUCTION: This exam was performed according to the departmental dose-optimization program which includes automated exposure control, adjustment of the mA and/or kV according to patient size and/or use of iterative reconstruction technique. COMPARISON:  02/25/2021. FINDINGS: Brain: Large, hyperattenuating mass enlarges and erodes the clivus, extending through the sella turcica, obliterating the sphenoid sinuses and extending into the posterior ethmoid air cells. Mass diffusely involves the cavernous sinuses, extending more laterally on the left than on the right. The mass broadly contacts the optic nerves from the orbital apices to anterior to the optic chiasm. Mass measures 6.2 cm from right to left, 4 cm from superior to inferior and 5.2 cm anterior to posterior. No other extra-axial masses. No intra-axial masses. No evidence of an infarct and no intracranial hemorrhage. No hydrocephalus. Vascular: No hyperdense vessel or unexpected calcification. Mass broadly involves both cavernous sinuses as detailed above. Skull:  In addition to the clivus, mass erodes the petrous portions of both temporal bones as well as the sphenoid bone. Sinuses/Orbits: No abnormality of either globe. No intraorbital abnormality. Sinuses unaffected by the mass are clear. Other: None. IMPRESSION: 1. Large mass at the skull base, eroding and expanding the clivus, also involving the sphenoid bone and the petrous portions of both temporal bones. The mass broadly abuts both optic nerves between the chiasm and orbital apices. Mass also broadly involves both cavernous sinuses. 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 Recommend further assessment with brain MRI without and with contrast. 2. No acute finding. No evidence of an infarct, hydrocephalus or intracranial hemorrhage. Electronically Signed   By: Amie Portland M.D.   On: 05/20/2023 10:22    Pending Labs  Unresulted Labs (From admission, onward)    None       Vitals/Pain Today's Vitals   05/20/23 1200 05/20/23 1300 05/20/23 1504 05/20/23 1523  BP: 122/84  (!) 141/94   Pulse: 68  78   Resp: 18  16   Temp:  97.8 F (36.6 C)    TempSrc:      SpO2: 100%  99%   Weight:      Height:      PainSc:    0-No pain    Isolation Precautions No active isolations  Medications Medications  LORazepam (ATIVAN) injection 1 mg (1 mg Intravenous Given 05/20/23 1148)  dexamethasone (DECADRON) injection 10 mg (10 mg Intravenous Given 05/20/23 1353)  gadobutrol (GADAVIST) 1 MMOL/ML injection 8 mL (8 mLs Intravenous Contrast Given 05/20/23 1448)    Mobility walks     Focused Assessments Neuro Assessment Handoff:  Swallow screen pass?  N/A         Neuro Assessment:   Neuro Checks:      Has TPA been given? No If patient is a Neuro Trauma and patient is going to OR before floor call report to 4N Charge nurse: [phone] or [phone]   R Recommendations: See Admitting Provider Note  Report given to:    Additional Notes:

## 2023-05-20 NOTE — ED Triage Notes (Signed)
Pt states eye doctor requested for pt to have MRI due to vision changes to left eye, "it's been going on for three weeks"

## 2023-05-21 ENCOUNTER — Ambulatory Visit
Admission: RE | Admit: 2023-05-21 | Discharge: 2023-05-21 | Disposition: A | Discharge status: Home / Self Care | Payer: Medicare HMO | Source: Ambulatory Visit | Attending: Radiation Oncology | Admitting: Radiation Oncology

## 2023-05-21 ENCOUNTER — Inpatient Hospital Stay (HOSPITAL_COMMUNITY): Payer: Medicare HMO

## 2023-05-21 ENCOUNTER — Ambulatory Visit: Admit: 2023-05-21 | Payer: Medicare HMO | Admitting: Radiation Oncology

## 2023-05-21 DIAGNOSIS — C9002 Multiple myeloma in relapse: Secondary | ICD-10-CM

## 2023-05-21 DIAGNOSIS — G9389 Other specified disorders of brain: Secondary | ICD-10-CM | POA: Diagnosis not present

## 2023-05-21 DIAGNOSIS — Z510 Encounter for antineoplastic radiation therapy: Secondary | ICD-10-CM | POA: Insufficient documentation

## 2023-05-21 DIAGNOSIS — M84411A Pathological fracture, right shoulder, initial encounter for fracture: Secondary | ICD-10-CM | POA: Diagnosis not present

## 2023-05-21 DIAGNOSIS — C9000 Multiple myeloma not having achieved remission: Secondary | ICD-10-CM | POA: Insufficient documentation

## 2023-05-21 LAB — CBC WITH DIFFERENTIAL/PLATELET
Abs Immature Granulocytes: 0.03 10*3/uL (ref 0.00–0.07)
Basophils Absolute: 0 10*3/uL (ref 0.0–0.1)
Basophils Relative: 0 %
Eosinophils Absolute: 0 10*3/uL (ref 0.0–0.5)
Eosinophils Relative: 0 %
HCT: 30.3 % — ABNORMAL LOW (ref 39.0–52.0)
Hemoglobin: 9.9 g/dL — ABNORMAL LOW (ref 13.0–17.0)
Immature Granulocytes: 1 %
Lymphocytes Relative: 22 %
Lymphs Abs: 1.1 10*3/uL (ref 0.7–4.0)
MCH: 29.3 pg (ref 26.0–34.0)
MCHC: 32.7 g/dL (ref 30.0–36.0)
MCV: 89.6 fL (ref 80.0–100.0)
Monocytes Absolute: 0.2 10*3/uL (ref 0.1–1.0)
Monocytes Relative: 4 %
Neutro Abs: 3.8 10*3/uL (ref 1.7–7.7)
Neutrophils Relative %: 73 %
Platelets: 242 10*3/uL (ref 150–400)
RBC: 3.38 MIL/uL — ABNORMAL LOW (ref 4.22–5.81)
RDW: 14.3 % (ref 11.5–15.5)
WBC: 5.2 10*3/uL (ref 4.0–10.5)
nRBC: 0 % (ref 0.0–0.2)

## 2023-05-21 LAB — COMPREHENSIVE METABOLIC PANEL
ALT: 17 U/L (ref 0–44)
AST: 15 U/L (ref 15–41)
Albumin: 2.9 g/dL — ABNORMAL LOW (ref 3.5–5.0)
Alkaline Phosphatase: 44 U/L (ref 38–126)
Anion gap: 4 — ABNORMAL LOW (ref 5–15)
BUN: 30 mg/dL — ABNORMAL HIGH (ref 8–23)
CO2: 20 mmol/L — ABNORMAL LOW (ref 22–32)
Calcium: 8.3 mg/dL — ABNORMAL LOW (ref 8.9–10.3)
Chloride: 102 mmol/L (ref 98–111)
Creatinine, Ser: 1.66 mg/dL — ABNORMAL HIGH (ref 0.61–1.24)
GFR, Estimated: 42 mL/min — ABNORMAL LOW (ref >60)
Glucose, Bld: 144 mg/dL — ABNORMAL HIGH (ref 70–99)
Potassium: 4.5 mmol/L (ref 3.5–5.1)
Sodium: 126 mmol/L — ABNORMAL LOW (ref 135–145)
Total Bilirubin: 0.4 mg/dL (ref 0.3–1.2)
Total Protein: 9.8 g/dL — ABNORMAL HIGH (ref 6.5–8.1)

## 2023-05-21 MED ORDER — IOHEXOL 9 MG/ML PO SOLN
500.0000 mL | ORAL | Status: AC
  Administered 2023-05-21 (×2): 500 mL via ORAL

## 2023-05-21 MED ORDER — IOHEXOL 9 MG/ML PO SOLN
ORAL | Status: AC
  Filled 2023-05-21: qty 1000

## 2023-05-21 MED ORDER — IOHEXOL 9 MG/ML PO SOLN: 500.0000 mL | ORAL | Status: AC

## 2023-05-21 MED ORDER — SODIUM CHLORIDE 0.9 % IV SOLN: INTRAVENOUS | Status: AC

## 2023-05-21 NOTE — Progress Notes (Signed)
PROGRESS NOTE    Marcus Hatfield  ZOX:[id number] DOB: 03-23-1946 DOA: 05/20/2023 PCP: Chilton Greathouse, MD   Brief Narrative:  77 y.o. male with medical history significant of multiple myeloma, renal cell carcinoma status post nephrectomy, managed by [facility], HTN, CKD 3b presented to ophthalmology with concerns of left eye blurry vision.  CT of the head was obtained which showed brain mass.  He was referred to the ED.  On presentation, MRI of brain showed large enhancing mass lesion in the skull base but with other similar lesions, this may represent multiple myeloma with a large skull base plasmacytoma.  EDP spoke to neurosurgery/Dr. Franky Macho who recommended nonoperative management and recommended oncology consultation.  Oncology was consulted.  Assessment & Plan:   Large enhancing mass lesion in the skull base/possible multiple myeloma with a large skull base plasmacytoma Multiple myeloma in relapse -EDP spoke to neurosurgery/Dr. Franky Macho who recommended nonoperative management and recommended oncology consultation.  Oncology was consulted.  Follow further oncology recommendations.  Continue Decadron.   -Radiation oncology apparently planning for simulation on Monday with subsequent plan to start XRT  Left eye blurred vision -Possibly from above  Pathologic fracture, right clavicle -Diagnosed on 05/06/2023. -Continue pain management if needed.  Hyponatremia -Possibly in the setting of SIADH due to malignancy -Sodium worsened to 126 today.  Will resume gentle hydration for today.  Repeat a.m. labs.  CKD stage IIIb -Creatinine currently stable.  Monitor  Normocytic anemia/anemia of chronic disease -Hemoglobin stable.  Monitor intermittently  History of renal cell carcinoma status post right nephrectomy in 2017, partial left nephrectomy in 06/2021 -Does not appear to have metastatic disease.  Hypertension -Blood pressure currently stable.  Continue Coreg.  Norvasc and Micardis on  hold  BPH -Continue finasteride and tamsulosin  Physical deconditioning -PT eval   DVT prophylaxis: Lovenox Code Status: Full Family Communication: Wife at bedside Disposition Plan: Status is: Inpatient Remains inpatient appropriate because: Of severity of illness  Consultants: Oncology/radiation oncology.  ED provider spoke to neurosurgery/Dr. Franky Macho  Procedures: None  Antimicrobials: None   Subjective: Patient seen and examined at bedside.  Denies any headache, nausea, vomiting or fever.  Objective: Vitals:   05/20/23 1610 05/20/23 2120 05/21/23 0015 05/21/23 0415  BP: 130/82 136/77 121/77 127/77  Pulse: 79 86 68 63  Resp: 16 18 16 16   Temp: 97.8 F (36.6 C) 98.1 F (36.7 C) 97.8 F (36.6 C) 97.6 F (36.4 C)  TempSrc: Oral Oral Oral Oral  SpO2: 99% 100% 100% 100%  Weight:      Height:        Intake/Output Summary (Last 24 hours) at 05/21/2023 1010 Last data filed at 05/21/2023 0801 Gross per 24 hour  Intake 816.25 ml  Output 500 ml  Net 316.25 ml   Filed Weights   05/20/23 0946 05/20/23 1603  Weight: 82 kg 79.1 kg    Examination:  General exam: Appears calm and comfortable.  On room air. Respiratory system: Bilateral decreased breath sounds at bases Cardiovascular system: S1 & S2 heard, Rate controlled Gastrointestinal system: Abdomen is nondistended, soft and nontender. Normal bowel sounds heard. Extremities: No cyanosis, clubbing, edema  Central nervous system: Alert and oriented. No focal neurological deficits. Moving extremities Skin: No rashes, lesions or ulcers Psychiatry: Judgement and insight appear normal. Mood & affect appropriate.     Data Reviewed: I have personally reviewed following labs and imaging studies  CBC: Recent Labs  Lab 05/20/23 1148 05/21/23 0533  WBC 5.3 5.2  NEUTROABS  --  3.8  HGB 11.3* 9.9*  HCT 34.6* 30.3*  MCV 89.2 89.6  PLT 266 242   Basic Metabolic Panel: Recent Labs  Lab 05/20/23 1148 05/21/23 0533   NA 129* 126*  K 4.6 4.5  CL 98 102  CO2 24 20*  GLUCOSE 96 144*  BUN 26* 30*  CREATININE 1.87* 1.66*  CALCIUM 9.0 8.3*   GFR: Estimated Creatinine Clearance: 37.9 mL/min (A) (by C-G formula based on SCr of 1.66 mg/dL (H)). Liver Function Tests: Recent Labs  Lab 05/21/23 0533  AST 15  ALT 17  ALKPHOS 44  BILITOT 0.4  PROT 9.8*  ALBUMIN 2.9*   No results for input(s): "LIPASE", "AMYLASE" in the last 168 hours. No results for input(s): "AMMONIA" in the last 168 hours. Coagulation Profile: No results for input(s): "INR", "PROTIME" in the last 168 hours. Cardiac Enzymes: No results for input(s): "CKTOTAL", "CKMB", "CKMBINDEX", "TROPONINI" in the last 168 hours. BNP (last 3 results) No results for input(s): "PROBNP" in the last 8760 hours. HbA1C: No results for input(s): "HGBA1C" in the last 72 hours. CBG: No results for input(s): "GLUCAP" in the last 168 hours. Lipid Profile: No results for input(s): "CHOL", "HDL", "LDLCALC", "TRIG", "CHOLHDL", "LDLDIRECT" in the last 72 hours. Thyroid Function Tests: No results for input(s): "TSH", "T4TOTAL", "FREET4", "T3FREE", "THYROIDAB" in the last 72 hours. Anemia Panel: No results for input(s): "VITAMINB12", "FOLATE", "FERRITIN", "TIBC", "IRON", "RETICCTPCT" in the last 72 hours. Sepsis Labs: No results for input(s): "PROCALCITON", "LATICACIDVEN" in the last 168 hours.  No results found for this or any previous visit (from the past 240 hour(s)).       Radiology Studies: MR Brain W and Wo Contrast  Addendum Date: 05/20/2023   ADDENDUM REPORT: 05/20/2023 15:48 ADDENDUM: Lytic lesions have been identified previously with this patient. 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 Electronically Signed   By: Marin Roberts M.D.   On: 05/20/2023 15:48   Result Date: 05/20/2023 CLINICAL DATA:  Visual is turbinates to the left eye. Abnormal CT scan. EXAM: MRI HEAD WITHOUT AND WITH CONTRAST TECHNIQUE:  Multiplanar, multiecho pulse sequences of the brain and surrounding structures were obtained without and with intravenous contrast. CONTRAST:  8mL GADAVIST GADOBUTROL 1 MMOL/ML IV SOLN COMPARISON:  CT of the head and maxillofacial 02/25/2021. FINDINGS: Brain: No acute infarct, hemorrhage, or mass lesion is present. Mild atrophy and white matter changes are likely within normal limits for age. The ventricles are of normal size. No significant extraaxial fluid collection is present. Deep brain nuclei are within normal limits. Enhancing osseous tumor extends to the anterior margin of the right IAC. Internal auditory canals are otherwise within normal limits bilaterally. Inner ear structures are within normal limits. Vascular: Insert normal flow Skull and upper cervical spine: A slightly heterogeneous enhancing mass lesion is again noted at the skull base. This extends along the petrous ridge bilaterally, greater on the right. Tumor surrounds the right internal carotid artery without definite stenosis. There is some heterogeneity to the tumor on precontrast T2 weighted imaging. The lesion measures 7.8 x 5.6 x 3.3 cm. Multiple additional enhancing lesions are present throughout the skull. Lesion in the anterior right frontal skull measures 11 mm on image 110 of series 17. A lesion in the left parietal skull on image 107 of series 17 measures 11 mm. Multiple other smaller lesions are present as well. Sinuses/Orbits: Minimal mucosal thickening scratched at minimal fluid is present in the right maxillary sinus. The paranasal sinuses are  otherwise clear. A right mastoid effusion is present. The tumor impacts the right eustachian tube. Other: IMPRESSION: 1. 7.8 x 5.6 x 3.3 cm enhancing mass lesion at the skull base extending along the petrous ridge bilaterally, greater on the right. 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 There are some intrinsic T2 signal scratched at there is some intrinsic T2 signal and  chordoma still considered. However, given the multiple other enhancing lesions throughout the skull, this most likely represents metastatic disease. 2. No acute intracranial abnormality. 3. Mild atrophy and white matter changes are likely within normal limits for age. 4. Right mastoid effusion secondary to obstruction of the eustachian tube. 5. Minimal right maxillary sinus disease. Electronically Signed: By: Marin Roberts M.D. On: 05/20/2023 15:33   MR ORBITS W WO CONTRAST  Result Date: 05/20/2023 CLINICAL DATA:  Visual disturbance to the left eye for 3 weeks. Vision loss, monocular. Skull base mass. EXAM: MRI OF THE ORBITS WITHOUT AND WITH CONTRAST TECHNIQUE: Multiplanar, multi-echo pulse sequences of the orbits and surrounding structures were acquired including fat saturation techniques, before and after intravenous contrast administration. CONTRAST:  8mL GADAVIST GADOBUTROL 1 MMOL/ML IV SOLN COMPARISON:  CT head without contrast 05/20/2023. MR head without and with contrast 05/20/2023 FINDINGS: Enhancing skull base tumor centered in the clivus measuring 7.8 x 5.3 x 3.7 cm. Tumor extends along the petrous apex, right greater than left. The tumor surrounds the petrous right ICA. Tumor extends into the floor of the sella abuts the cavernous sinus bilaterally. Tumor invades the posterior ethmoid air cells and sphenoid sinuses bilaterally. Tumor extends to the left orbital apex and likely impacts the left optic nerve. The optic chiasm is within normal limits. Tumor extends into the nasal cavity more prominently on the left. Multiple additional foci of enhancement are present within the skull. Orbits: Bilateral lens replacements are present. The globes are within normal limits. The optic nerve is within normal limits within the canal bilaterally. Extraocular muscles are unremarkable. The superior ophthalmic vein is symmetric. Visualized sinuses: Tumor extends into the posterior ethmoid air cells and sphenoid  sinuses as described. A right mastoid effusion is secondary to obstruction. Soft tissues: Extracranial soft tissues are unremarkable. Limited intracranial: Visualized intracranial contents are within normal limits. IMPRESSION: 1. Enhancing skull base tumor centered in the clivus measuring 7.8 x 5.3 x 3.7 cm. Tumor extends along the petrous apex, right greater than left. The tumor surrounds the petrous right ICA. Tumor extends into the floor of the sella abuts the cavernous sinus bilaterally. Tumor invades the posterior ethmoid air cells and sphenoid sinuses bilaterally. Tumor extends to the left orbital apex and likely impacts the left optic nerve. Based on the initial CT, consideration of chordoma was made. Although there is some intrinsic T2 signal and heterogeneous enhancement, the multiple other enhancing lesions suggest this is most likely metastatic disease. Tumor extending into the nasal cavity may be assessable for biopsy. 2. Tumor extends into the nasal cavity more prominently on the left. 3. Right mastoid effusion is secondary to obstruction. Electronically Signed   By: Marin Roberts M.D.   On: 05/20/2023 15:41   CT Head Wo Contrast  Result Date: 05/20/2023 CLINICAL DATA:  Vision loss. EXAM: CT HEAD WITHOUT CONTRAST TECHNIQUE: Contiguous axial images were obtained from the base of the skull through the vertex without intravenous contrast. RADIATION DOSE REDUCTION: This exam was performed according to the departmental dose-optimization program which includes automated exposure control, adjustment of the mA and/or kV according to  patient size and/or use of iterative reconstruction technique. COMPARISON:  02/25/2021. FINDINGS: Brain: Large, hyperattenuating mass enlarges and erodes the clivus, extending through the sella turcica, obliterating the sphenoid sinuses and extending into the posterior ethmoid air cells. Mass diffusely involves the cavernous sinuses, extending more laterally on the left  than on the right. The mass broadly contacts the optic nerves from the orbital apices to anterior to the optic chiasm. Mass measures 6.2 cm from right to left, 4 cm from superior to inferior and 5.2 cm anterior to posterior. No other extra-axial masses. No intra-axial masses. No evidence of an infarct and no intracranial hemorrhage. No hydrocephalus. Vascular: No hyperdense vessel or unexpected calcification. Mass broadly involves both cavernous sinuses as detailed above. Skull: In addition to the clivus, mass erodes the petrous portions of both temporal bones as well as the sphenoid bone. Sinuses/Orbits: No abnormality of either globe. No intraorbital abnormality. Sinuses unaffected by the mass are clear. Other: None. IMPRESSION: 1. Large mass at the skull base, eroding and expanding the clivus, also involving the sphenoid bone and the petrous portions of both temporal bones. The mass broadly abuts both optic nerves between the chiasm and orbital apices. Mass also broadly involves both cavernous sinuses. 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 Recommend further assessment with brain MRI without and with contrast. 2. No acute finding. No evidence of an infarct, hydrocephalus or intracranial hemorrhage. Electronically Signed   By: Amie Portland M.D.   On: 05/20/2023 10:22        Scheduled Meds:  aspirin EC  81 mg Oral Daily   carvedilol  6.25 mg Oral BID   dexamethasone (DECADRON) injection  5 mg Intravenous Q6H   docusate sodium  100 mg Oral BID   enoxaparin (LOVENOX) injection  40 mg Subcutaneous Q24H   finasteride  5 mg Oral Daily   tamsulosin  0.4 mg Oral QPC supper   Continuous Infusions:        [name], MD [facility] 05/21/2023, 10:10 AM

## 2023-05-21 NOTE — Consult Note (Addendum)
Radiation Oncology         [phone]) [phone] ________________________________  Name: Marcus Hatfield MRN: [id number]  Date: 05/21/2023  DOB: 05/20/1946  WU:JWJX, Ravisankar, MD  No ref. provider found     REFERRING PHYSICIAN: [name], MD   DIAGNOSIS: The primary encounter diagnosis was Brain mass. A diagnosis of Vision loss of left eye was also pertinent to this visit.   HISTORY OF PRESENT ILLNESS::Marcus Hatfield is a 77 y.o. male who is seen for an initial consultation visit regarding the patient's diagnosis of multiple myeloma.  The patient was initially diagnosed with myeloma in 2016.  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  The patient has subsequently undergone systemic treatment although recently he has been on observation.  The patient presented with changes in vision especially with regards to his left eye in addition to what he describes as some sinus pressure.  Imaging shows a bulky tumor in the base of skull region.  The patient also earlier last month had some pain in the region of the right upper chest and x-rays demonstrated a pathologic fracture of the right clavicle.  He has been started on dexamethasone and he states that his vision has been improving since he has been here in the hospital.  I been asked to see the patient for consideration of possible palliative radiation treatment.  Neurosurgery has seen the patient and does not recommend resection.    PREVIOUS RADIATION THERAPY: No   PAST MEDICAL HISTORY:  has a past medical history of Arthritis, CKD (chronic kidney disease) stage 3, GFR 30-59 ml/min (HCC) (20015), Hypertension, Multiple myeloma (HCC) (2016), and Prostate disorder (02/2017).     PAST SURGICAL HISTORY: Past Surgical History:  Procedure Laterality Date   EYE SURGERY Bilateral 01/30/2018   KNEE SURGERY     over ten years ago   NEPHRECTOMY Right 03/2015   ROBOTIC ASSITED PARTIAL NEPHRECTOMY Left 07/07/2021   Procedure: XI ROBOTIC ASSITED  PARTIAL NEPHRECTOMY;  Surgeon: [name], MD;  Location: [facility];  Service: Urology;  Laterality: Left;  5 HRS   XI ROBOTIC ASSISTED SIMPLE PROSTATECTOMY N/A 07/07/2021   Procedure: XI ROBOTIC ASSISTED SIMPLE PROSTATECTOMY;  Surgeon: [name], MD;  Location: [facility];  Service: Urology;  Laterality: N/A;     FAMILY HISTORY: family history includes Diabetes in his father; Hypertension in his brother.   SOCIAL HISTORY:  reports that he has never smoked. He has never used smokeless tobacco. He reports that he does not drink alcohol and does not use drugs.   ALLERGIES: Penicillins, Latex, and Tape   MEDICATIONS:  Current Facility-Administered Medications  Medication Dose Route Frequency Provider Last Rate Last Admin   0.9 %  sodium chloride infusion   Intravenous Continuous [name], MD 75 mL/hr at 05/21/23 1228 New Bag at 05/21/23 1228   aspirin EC tablet 81 mg  81 mg Oral Daily Albertine Grates, MD   81 mg at 05/21/23 1033   carvedilol (COREG) tablet 6.25 mg  6.25 mg Oral BID Albertine Grates, MD   6.25 mg at 05/21/23 1033   dexamethasone (DECADRON) injection 5 mg  5 mg Intravenous Q6H Albertine Grates, MD   5 mg at 05/21/23 1227   docusate sodium (COLACE) capsule 100 mg  100 mg Oral BID Albertine Grates, MD   100 mg at 05/21/23 1033   enoxaparin (LOVENOX) injection 40 mg  40 mg Subcutaneous Q24H Albertine Grates, MD   40 mg at 05/20/23 2125   finasteride (PROSCAR) tablet 5  mg  5 mg Oral Daily Albertine Grates, MD   5 mg at 05/21/23 1033   fluticasone (FLONASE) 50 MCG/ACT nasal spray 2 spray  2 spray Each Nare Daily PRN Albertine Grates, MD       tamsulosin [facility]) capsule 0.4 mg  0.4 mg Oral QPC supper Albertine Grates, MD   0.4 mg at 05/20/23 1932     REVIEW OF SYSTEMS:  A 15 point review of systems is documented in the electronic medical record. This was obtained by the nursing staff. However, I reviewed this with the patient to discuss relevant findings and make appropriate changes.  Pertinent items are noted in HPI.    PHYSICAL  EXAM:  height is 5\' 9"  (1.753 m) and weight is 174 lb 6.1 oz (79.1 kg). His oral temperature is 98 F (36.7 C). His blood pressure is 128/80 and his pulse is 79. His respiration is 16 and oxygen saturation is 100%.   ECOG = 1  0 - Asymptomatic (Fully active, able to carry on all predisease activities without restriction)  1 - Symptomatic but completely ambulatory (Restricted in physically strenuous activity but ambulatory and able to carry out work of a light or sedentary nature. For example, light housework, office work)  2 - Symptomatic, <50% in bed during the day (Ambulatory and capable of all self care but unable to carry out any work activities. Up and about more than 50% of waking hours)  3 - Symptomatic, >50% in bed, but not bedbound (Capable of only limited self-care, confined to bed or chair 50% or more of waking hours)  4 - Bedbound (Completely disabled. Cannot carry on any self-care. Totally confined to bed or chair)  5 - Death   Santiago Glad MM, Creech RH, Tormey DC, et al. [phone]). "Toxicity and response criteria of the [facility]". Am. Evlyn Clines. Oncol. 5 (6): 649-55  Alert, no acute distress   LABORATORY DATA:  Lab Results  Component Value Date   WBC 5.2 05/21/2023   HGB 9.9 (L) 05/21/2023   HCT 30.3 (L) 05/21/2023   MCV 89.6 05/21/2023   PLT 242 05/21/2023   Lab Results  Component Value Date   NA 126 (L) 05/21/2023   K 4.5 05/21/2023   CL 102 05/21/2023   CO2 20 (L) 05/21/2023   Lab Results  Component Value Date   ALT 17 05/21/2023   AST 15 05/21/2023   ALKPHOS 44 05/21/2023   BILITOT 0.4 05/21/2023      RADIOGRAPHY: MR Brain W and Wo Contrast  Addendum Date: 05/20/2023   ADDENDUM REPORT: 05/20/2023 15:48 ADDENDUM: Lytic lesions have been identified previously with this patient. 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 Electronically Signed   By: Marin Roberts M.D.   On: 05/20/2023 15:48   Result  Date: 05/20/2023 CLINICAL DATA:  Visual is turbinates to the left eye. Abnormal CT scan. EXAM: MRI HEAD WITHOUT AND WITH CONTRAST TECHNIQUE: Multiplanar, multiecho pulse sequences of the brain and surrounding structures were obtained without and with intravenous contrast. CONTRAST:  8mL GADAVIST GADOBUTROL 1 MMOL/ML IV SOLN COMPARISON:  CT of the head and maxillofacial 02/25/2021. FINDINGS: Brain: No acute infarct, hemorrhage, or mass lesion is present. Mild atrophy and white matter changes are likely within normal limits for age. The ventricles are of normal size. No significant extraaxial fluid collection is present. Deep brain nuclei are within normal limits. Enhancing osseous tumor extends to the anterior margin of the right IAC. Internal  auditory canals are otherwise within normal limits bilaterally. Inner ear structures are within normal limits. Vascular: Insert normal flow Skull and upper cervical spine: A slightly heterogeneous enhancing mass lesion is again noted at the skull base. This extends along the petrous ridge bilaterally, greater on the right. Tumor surrounds the right internal carotid artery without definite stenosis. There is some heterogeneity to the tumor on precontrast T2 weighted imaging. The lesion measures 7.8 x 5.6 x 3.3 cm. Multiple additional enhancing lesions are present throughout the skull. Lesion in the anterior right frontal skull measures 11 mm on image 110 of series 17. A lesion in the left parietal skull on image 107 of series 17 measures 11 mm. Multiple other smaller lesions are present as well. Sinuses/Orbits: Minimal mucosal thickening scratched at minimal fluid is present in the right maxillary sinus. The paranasal sinuses are otherwise clear. A right mastoid effusion is present. The tumor impacts the right eustachian tube. Other: IMPRESSION: 1. 7.8 x 5.6 x 3.3 cm enhancing mass lesion at the skull base extending along the petrous ridge bilaterally, greater on the right.  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 There are some intrinsic T2 signal scratched at there is some intrinsic T2 signal and chordoma still considered. However, given the multiple other enhancing lesions throughout the skull, this most likely represents metastatic disease. 2. No acute intracranial abnormality. 3. Mild atrophy and white matter changes are likely within normal limits for age. 4. Right mastoid effusion secondary to obstruction of the eustachian tube. 5. Minimal right maxillary sinus disease. Electronically Signed: By: Marin Roberts M.D. On: 05/20/2023 15:33   MR ORBITS W WO CONTRAST  Result Date: 05/20/2023 CLINICAL DATA:  Visual disturbance to the left eye for 3 weeks. Vision loss, monocular. Skull base mass. EXAM: MRI OF THE ORBITS WITHOUT AND WITH CONTRAST TECHNIQUE: Multiplanar, multi-echo pulse sequences of the orbits and surrounding structures were acquired including fat saturation techniques, before and after intravenous contrast administration. CONTRAST:  8mL GADAVIST GADOBUTROL 1 MMOL/ML IV SOLN COMPARISON:  CT head without contrast 05/20/2023. MR head without and with contrast 05/20/2023 FINDINGS: Enhancing skull base tumor centered in the clivus measuring 7.8 x 5.3 x 3.7 cm. Tumor extends along the petrous apex, right greater than left. The tumor surrounds the petrous right ICA. Tumor extends into the floor of the sella abuts the cavernous sinus bilaterally. Tumor invades the posterior ethmoid air cells and sphenoid sinuses bilaterally. Tumor extends to the left orbital apex and likely impacts the left optic nerve. The optic chiasm is within normal limits. Tumor extends into the nasal cavity more prominently on the left. Multiple additional foci of enhancement are present within the skull. Orbits: Bilateral lens replacements are present. The globes are within normal limits. The optic nerve is within normal limits within the canal bilaterally. Extraocular muscles are  unremarkable. The superior ophthalmic vein is symmetric. Visualized sinuses: Tumor extends into the posterior ethmoid air cells and sphenoid sinuses as described. A right mastoid effusion is secondary to obstruction. Soft tissues: Extracranial soft tissues are unremarkable. Limited intracranial: Visualized intracranial contents are within normal limits. IMPRESSION: 1. Enhancing skull base tumor centered in the clivus measuring 7.8 x 5.3 x 3.7 cm. Tumor extends along the petrous apex, right greater than left. The tumor surrounds the petrous right ICA. Tumor extends into the floor of the sella abuts the cavernous sinus bilaterally. Tumor invades the posterior ethmoid air cells and sphenoid sinuses bilaterally. Tumor extends to the left orbital apex and  likely impacts the left optic nerve. Based on the initial CT, consideration of chordoma was made. Although there is some intrinsic T2 signal and heterogeneous enhancement, the multiple other enhancing lesions suggest this is most likely metastatic disease. Tumor extending into the nasal cavity may be assessable for biopsy. 2. Tumor extends into the nasal cavity more prominently on the left. 3. Right mastoid effusion is secondary to obstruction. Electronically Signed   By: Marin Roberts M.D.   On: 05/20/2023 15:41   CT Head Wo Contrast  Result Date: 05/20/2023 CLINICAL DATA:  Vision loss. EXAM: CT HEAD WITHOUT CONTRAST TECHNIQUE: Contiguous axial images were obtained from the base of the skull through the vertex without intravenous contrast. RADIATION DOSE REDUCTION: This exam was performed according to the departmental dose-optimization program which includes automated exposure control, adjustment of the mA and/or kV according to patient size and/or use of iterative reconstruction technique. COMPARISON:  02/25/2021. FINDINGS: Brain: Large, hyperattenuating mass enlarges and erodes the clivus, extending through the sella turcica, obliterating the sphenoid  sinuses and extending into the posterior ethmoid air cells. Mass diffusely involves the cavernous sinuses, extending more laterally on the left than on the right. The mass broadly contacts the optic nerves from the orbital apices to anterior to the optic chiasm. Mass measures 6.2 cm from right to left, 4 cm from superior to inferior and 5.2 cm anterior to posterior. No other extra-axial masses. No intra-axial masses. No evidence of an infarct and no intracranial hemorrhage. No hydrocephalus. Vascular: No hyperdense vessel or unexpected calcification. Mass broadly involves both cavernous sinuses as detailed above. Skull: In addition to the clivus, mass erodes the petrous portions of both temporal bones as well as the sphenoid bone. Sinuses/Orbits: No abnormality of either globe. No intraorbital abnormality. Sinuses unaffected by the mass are clear. Other: None. IMPRESSION: 1. Large mass at the skull base, eroding and expanding the clivus, also involving the sphenoid bone and the petrous portions of both temporal bones. The mass broadly abuts both optic nerves between the chiasm and orbital apices. Mass also broadly involves both cavernous sinuses. 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 Recommend further assessment with brain MRI without and with contrast. 2. No acute finding. No evidence of an infarct, hydrocephalus or intracranial hemorrhage. Electronically Signed   By: Amie Portland M.D.   On: 05/20/2023 10:22   DG Chest 2 View  Result Date: 05/06/2023 CLINICAL DATA:  77 year old male with history of right clavicular pain. EXAM: CHEST - 2 VIEW COMPARISON:  Chest x-ray 05/02/2023. FINDINGS: Mildly displaced fracture of the medial right clavicle. Lucency in the region of the medial right clavicle suggesting an underlying bony lesion. Old healed fractures of the lateral right fifth and sixth ribs with abundant callus formation. Lung  volumes are normal. No consolidative airspace disease. No pleural effusions. No pneumothorax. No pulmonary nodule or mass noted. Pulmonary vasculature and the cardiomediastinal silhouette are within normal limits. IMPRESSION: 1.  No radiographic evidence of acute cardiopulmonary disease. 2. Probable pathologic fracture of the medial right clavicle better demonstrated on dedicated right clavicular radiographs. 3. Old healed fractures of the lateral right fifth and sixth ribs. Electronically Signed   By: Trudie Reed M.D.   On: 05/06/2023 09:56   DG Clavicle Right  Result Date: 05/06/2023 CLINICAL DATA:  77 year old male with history of pain in the region of the right clavicle. EXAM: RIGHT CLAVICLE - 2+ VIEWS COMPARISON:  No priors. FINDINGS: Multiple lucent lesions in the right clavicle, which could reflect metastatic lesions in this patient with history of renal cell carcinoma, or could be myelomatous lesions. Mildly displaced fracture of the medial right clavicle noted with approximately 8 mm of inferior displacement of the distal aspect of the clavicle. IMPRESSION: 1. Mildly displaced likely pathologic fracture of the medial right clavicle, as above. Multiple additional lytic lesions in the clavicle could represent myelomatous lesions or metastatic lesions in this patient with history of renal cell carcinoma. Electronically Signed   By: Trudie Reed M.D.   On: 05/06/2023 09:54   DG Shoulder Right  Result Date: 05/06/2023 CLINICAL DATA:  Right shoulder pain. EXAM: RIGHT SHOULDER - 3 VIEW COMPARISON:  None Available. FINDINGS: No acute fracture or dislocation. Degenerative spurring at the acromioclavicular joint. Few subtle lucent areas are seen over the humeral diaphysis (with endosteal scalloping) and at the mid clavicle. Remote fifth and sixth rib fractures with hypertrophic callus. There is known history of multiple myeloma with lesion seen on chest CT from 09/26/2022 IMPRESSION: 1. No acute  finding. 2. Known myeloma. Electronically Signed   By: Tiburcio Pea M.D.   On: 05/06/2023 08:41   DG Chest 2 View  Result Date: 05/05/2023 CLINICAL DATA:  chest xray EXAM: CHEST - 2 VIEW COMPARISON:  04/19/2022 FINDINGS: Cardiac silhouette is unremarkable. No pneumothorax or pleural effusion. The lungs are clear. Thoracic degenerative changes noted. Chronic posterior right-sided fifth and sixth rib deformities noted. IMPRESSION: No acute cardiopulmonary process. Electronically Signed   By: Layla Maw M.D.   On: 05/05/2023 22:27       IMPRESSION/ PLAN:  The patient is a pleasant 77 year old male with a diagnosis of multiple myeloma.  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  I discussed potential radiation treatment to this lesion.  We discussed that such a tumor is very radiosensitive and I would expect an excellent response.  We discussed a potential 2-week course of treatment corresponding to 30 Gray in 10 fractions.  I would like to begin planning his treatment tomorrow and also start his first fraction tomorrow afternoon as well.  Given his findings and ongoing pain in the right upper chest, also discussed with him treatment to the medial right clavicle.  All of his questions were answered and he does wish to proceed with such a treatment.  He will undergo simulation tomorrow morning in our department.   The patient was seen in person today in the hospital.  The total time spent on the patient's visit today was 55 minutes, including chart review, direct discussion/evaluation with the patient, and coordination of care.   ________________________________   Radene Gunning, MD, PhD   **Disclaimer: This note was dictated with voice recognition software. Similar sounding words can  inadvertently be transcribed and this note may contain transcription errors which may not have been corrected upon publication of note.**

## 2023-05-22 ENCOUNTER — Ambulatory Visit
Admit: 2023-05-22 | Discharge: 2023-05-22 | Disposition: A | Discharge status: Home / Self Care | Payer: Medicare HMO | Attending: Radiation Oncology | Admitting: Radiation Oncology

## 2023-05-22 ENCOUNTER — Other Ambulatory Visit: Payer: Self-pay

## 2023-05-22 ENCOUNTER — Ambulatory Visit
Admit: 2023-05-22 | Discharge: 2023-05-22 | Disposition: A | Discharge status: Home / Self Care | Payer: Medicare HMO | Admitting: Radiation Oncology

## 2023-05-22 DIAGNOSIS — C9002 Multiple myeloma in relapse: Secondary | ICD-10-CM | POA: Diagnosis not present

## 2023-05-22 DIAGNOSIS — G9389 Other specified disorders of brain: Secondary | ICD-10-CM | POA: Diagnosis not present

## 2023-05-22 LAB — BASIC METABOLIC PANEL
Anion gap: 4 — ABNORMAL LOW (ref 5–15)
BUN: 32 mg/dL — ABNORMAL HIGH (ref 8–23)
CO2: 20 mmol/L — ABNORMAL LOW (ref 22–32)
Calcium: 7.8 mg/dL — ABNORMAL LOW (ref 8.9–10.3)
Chloride: 104 mmol/L (ref 98–111)
Creatinine, Ser: 1.5 mg/dL — ABNORMAL HIGH (ref 0.61–1.24)
GFR, Estimated: 48 mL/min — ABNORMAL LOW (ref >60)
Glucose, Bld: 129 mg/dL — ABNORMAL HIGH (ref 70–99)
Potassium: 4.4 mmol/L (ref 3.5–5.1)
Sodium: 128 mmol/L — ABNORMAL LOW (ref 135–145)

## 2023-05-22 LAB — RAD ONC ARIA SESSION SUMMARY
Course Elapsed Days: 0
Plan Fractions Treated to Date: 1
Plan Fractions Treated to Date: 1
Plan Name: 615
Plan Prescribed Dose Per Fraction: 3 Gy
Plan Prescribed Dose Per Fraction: 3 Gy
Plan Total Fractions Prescribed: 10
Plan Total Fractions Prescribed: 10
Plan Total Prescribed Dose: 30 Gy
Plan Total Prescribed Dose: 30 Gy
Reference Point Dosage Given to Date: 3 Gy
Reference Point Dosage Given to Date: 3 Gy
Reference Point Session Dosage Given: 3 Gy
Reference Point Session Dosage Given: 3 Gy
Session Number: 1

## 2023-05-22 LAB — KAPPA/LAMBDA LIGHT CHAINS
Kappa free light chain: 468.2 mg/L — ABNORMAL HIGH (ref 3.3–19.4)
Kappa, lambda light chain ratio: 21 — ABNORMAL HIGH (ref 0.26–1.65)
Lambda free light chains: 22.3 mg/L (ref 5.7–26.3)

## 2023-05-22 LAB — CALCIUM, IONIZED: Calcium, Ionized, Serum: 5 mg/dL (ref 4.5–5.6)

## 2023-05-22 LAB — MAGNESIUM: Magnesium: 2.1 mg/dL (ref 1.7–2.4)

## 2023-05-22 MED ORDER — ACETAMINOPHEN 325 MG PO TABS
650.0000 mg | ORAL_TABLET | Freq: Four times a day (QID) | ORAL | Status: DC | PRN
  Administered 2023-05-22: 650 mg via ORAL
  Filled 2023-05-22: qty 2

## 2023-05-22 MED ORDER — SODIUM CHLORIDE 0.9 % IV SOLN: INTRAVENOUS | Status: AC

## 2023-05-22 NOTE — Evaluation (Signed)
Physical Therapy Evaluation Patient Details Name: Marcus Hatfield MRN: [id number] DOB: 12-May-1946 Today's Date: 05/22/2023  History of Present Illness  77 yo male presented to ophthalmology with concerns of left eye blurry vision. CT of the head was obtained which showed brain mass,  MRI of brain showed large enhancing mass lesion in the skull base but with other similar lesions, this may represent multiple myeloma with a large skull base plasmacytoma. EDP spoke to neurosurgery/Dr. Franky Macho who recommended nonoperative management and recommended oncology   PMH: multiple myeloma, renal cell carcinoma s/p nephrectomy, HTN, CKD 3b  Clinical Impression  Patient evaluated by Physical Therapy with no further acute PT needs identified. All education has been completed and the patient has no further questions.  See below for mobility; overall at baseline, if functional mobility should change, we are happy to re-consult on this very pleasant gentleman.  See below for any follow-up Physical Therapy or equipment needs. PT is signing off. Thank you for this referral.        Recommendations for follow up therapy are one component of a multi-disciplinary discharge planning process, led by the attending physician.  Recommendations may be updated based on patient status, additional functional criteria and insurance authorization.  Follow Up Recommendations       Assistance Recommended at Discharge PRN  Patient can return home with the following  Assist for transportation    Equipment Recommendations None recommended by PT  Recommendations for Other Services       Functional Status Assessment Patient has not had a recent decline in their functional status     Precautions / Restrictions Precautions Precautions: Fall Restrictions Weight Bearing Restrictions: No      Mobility  Bed Mobility               General bed mobility comments: pt in recliner    Transfers Overall transfer level:  Needs assistance Equipment used: Rolling walker (2 wheels) Transfers: Sit to/from Stand Sit to Stand: Supervision, Modified independent (Device/Increase time)           General transfer comment: steady on rising, supervision initially for safety    Ambulation/Gait Ambulation/Gait assistance: Supervision, Min guard, Modified independent (Device/Increase time) Gait Distance (Feet): 300 Feet Assistive device: None Gait Pattern/deviations: Step-through pattern       General Gait Details: initially slight drifiting, stability improved with distance. cues initially for L gaze secondary to low vision (improving per pt). no LOB--progressed to mod I  Stairs Stairs: Yes Stairs assistance: Supervision Stair Management: One rail Right, Alternating pattern Number of Stairs: 4 General stair comments: supervision for safety, cues for slowing speed and awareness of depth perception  secondary to L eye low vision/"blurry"per pt  Wheelchair Mobility    Modified Rankin (Stroke Patients Only)       Balance   Sitting-balance support: Feet supported, No upper extremity supported Sitting balance-Leahy Scale: Normal     Standing balance support: No upper extremity supported Standing balance-Leahy Scale: Fair Standing balance comment: Fair ++/no device fo rgait, no LOB with higher level balance, not tested to mod perturbations             High level balance activites: Side stepping, Backward walking, Turns, Head turns, Direction changes High Level Balance Comments: no LOB             Pertinent Vitals/Pain Pain Assessment Pain Assessment: No/denies pain    Home Living Family/patient expects to be discharged to:: Private residence Living Arrangements: Spouse/significant other Available Help at  Discharge: Family Type of Home: House Home Access: Stairs to enter Entrance Stairs-Rails: Right Entrance Stairs-Number of Steps: 2-3   Home Layout: One level Home Equipment: None       Prior Function Prior Level of Function : Independent/Modified Independent                     Hand Dominance        Extremity/Trunk Assessment   Upper Extremity Assessment Upper Extremity Assessment: Overall WFL for tasks assessed    Lower Extremity Assessment Lower Extremity Assessment: Overall WFL for tasks assessed; reports brief N/T in feet that subsides with activity       Communication   Communication: No difficulties  Cognition Arousal/Alertness: Awake/alert Behavior During Therapy: WFL for tasks assessed/performed Overall Cognitive Status: Within Functional Limits for tasks assessed                                          General Comments      Exercises     Assessment/Plan    PT Assessment Patient does not need any further PT services  PT Problem List         PT Treatment Interventions      PT Goals (Current goals can be found in the Care Plan section)  Acute Rehab PT Goals PT Goal Formulation: All assessment and education complete, DC therapy    Frequency       Co-evaluation               AM-PAC PT "6 Clicks" Mobility  Outcome Measure Help needed turning from your back to your side while in a flat bed without using bedrails?: None Help needed moving from lying on your back to sitting on the side of a flat bed without using bedrails?: None Help needed moving to and from a bed to a chair (including a wheelchair)?: None Help needed standing up from a chair using your arms (e.g., wheelchair or bedside chair)?: None Help needed to walk in hospital room?: None Help needed climbing 3-5 steps with a railing? : None 6 Click Score: 24    End of Session Equipment Utilized During Treatment: Gait belt Activity Tolerance: Patient tolerated treatment well Patient left: with call bell/phone within reach;in chair   PT Visit Diagnosis: Other abnormalities of gait and mobility (R26.89);Difficulty in walking, not elsewhere  classified (R26.2)    Time: 1610-9604 PT Time Calculation (min) (ACUTE ONLY): 10 min   Charges:   PT Evaluation $PT Eval Low Complexity: 1 Low          Durrel Mcnee, PT  Acute Rehab Dept [facility]) [phone]  05/22/2023   [facility] 05/22/2023, 3:32 PM

## 2023-05-22 NOTE — Progress Notes (Signed)
PROGRESS NOTE    Marcus Hatfield  ZOX:[id number] DOB: December 17, 1946 DOA: 05/20/2023 PCP: Chilton Greathouse, MD   Brief Narrative:  77 y.o. male with medical history significant of multiple myeloma, renal cell carcinoma status post nephrectomy, managed by [facility], HTN, CKD 3b presented to ophthalmology with concerns of left eye blurry vision.  CT of the head was obtained which showed brain mass.  He was referred to the ED.  On presentation, MRI of brain showed large enhancing mass lesion in the skull base but with other similar lesions, this may represent multiple myeloma with a large skull base plasmacytoma.  EDP spoke to neurosurgery/Dr. Franky Macho who recommended nonoperative management and recommended oncology consultation.  Oncology was consulted.  Assessment & Plan:   Large enhancing mass lesion in the skull base/possible multiple myeloma with a large skull base plasmacytoma Multiple myeloma in relapse -EDP spoke to neurosurgery/Dr. Franky Macho who recommended nonoperative management and recommended oncology consultation.   -Oncology following: Follow further oncology recommendations.  Continue Decadron.   -Radiation oncology apparently planning for simulation today with subsequent plan to start XRT  Left eye blurred vision -Possibly from above  Pathologic fracture, right clavicle -Diagnosed on 05/06/2023. -Continue pain management if needed. -Possible radiation to this area as well by radiation oncology  Hyponatremia -Possibly in the setting of SIADH due to malignancy -Sodium improving to 128 today.  Continue gentle hydration for today.  Repeat a.m. labs.  CKD stage IIIb -Creatinine currently stable.  Monitor  Normocytic anemia/anemia of chronic disease -Hemoglobin stable.  Monitor intermittently  History of renal cell carcinoma status post right nephrectomy in 2017, partial left nephrectomy in 06/2021 -Does not appear to have metastatic disease.  Hypertension -Blood pressure  currently stable.  Continue Coreg.  Norvasc and Micardis on hold  BPH -Continue finasteride and tamsulosin  Physical deconditioning -PT eval   DVT prophylaxis: Lovenox Code Status: Full Family Communication: Wife and grandson at bedside Disposition Plan: Status is: Inpatient Remains inpatient appropriate because: Of severity of illness  Consultants: Oncology/radiation oncology.  ED provider spoke to neurosurgery/Dr. Franky Macho  Procedures: None  Antimicrobials: None   Subjective: Patient seen and examined at bedside.  No fever, vomiting, shortness of breath reported. Objective: Vitals:   05/21/23 0415 05/21/23 1401 05/21/23 2010 05/22/23 0529  BP: 127/77 128/80 (!) 144/82 116/76  Pulse: 63 79 89 (!) 59  Resp: 16  16 16   Temp: 97.6 F (36.4 C) 98 F (36.7 C) 98.5 F (36.9 C) 97.6 F (36.4 C)  TempSrc: Oral Oral Oral Oral  SpO2: 100% 100% 100% 100%  Weight:      Height:        Intake/Output Summary (Last 24 hours) at 05/22/2023 0745 Last data filed at 05/22/2023 0616 Gross per 24 hour  Intake 1581.25 ml  Output 2050 ml  Net -468.75 ml    Filed Weights   05/20/23 0946 05/20/23 1603  Weight: 82 kg 79.1 kg    Examination:  General: Currently on room air.  No distress ENT/neck: No thyromegaly.  JVD is not elevated  respiratory: Decreased breath sounds at bases bilaterally with some crackles; no wheezing CVS: S1-S2 heard, mild intermittent bradycardia present Abdominal: Soft, nontender, slightly distended; no organomegaly, normal bowel sounds are heard Extremities: Trace lower extremity edema; no cyanosis  CNS: Awake and alert.  No focal neurologic deficit.  Moves extremities Lymph: No obvious lymphadenopathy Skin: No obvious ecchymosis/lesions  psych: Affect, judgment and mood are normal  musculoskeletal: No obvious joint swelling/deformity  Data Reviewed: I have personally reviewed following labs and imaging studies  CBC: Recent Labs  Lab  05/20/23 1148 05/21/23 0533  WBC 5.3 5.2  NEUTROABS  --  3.8  HGB 11.3* 9.9*  HCT 34.6* 30.3*  MCV 89.2 89.6  PLT 266 242    Basic Metabolic Panel: Recent Labs  Lab 05/20/23 1148 05/21/23 0533 05/22/23 0544  NA 129* 126* 128*  K 4.6 4.5 4.4  CL 98 102 104  CO2 24 20* 20*  GLUCOSE 96 144* 129*  BUN 26* 30* 32*  CREATININE 1.87* 1.66* 1.50*  CALCIUM 9.0 8.3* 7.8*  MG  --   --  2.1    GFR: Estimated Creatinine Clearance: 41.9 mL/min (A) (by C-G formula based on SCr of 1.5 mg/dL (H)). Liver Function Tests: Recent Labs  Lab 05/21/23 0533  AST 15  ALT 17  ALKPHOS 44  BILITOT 0.4  PROT 9.8*  ALBUMIN 2.9*    No results for input(s): "LIPASE", "AMYLASE" in the last 168 hours. No results for input(s): "AMMONIA" in the last 168 hours. Coagulation Profile: No results for input(s): "INR", "PROTIME" in the last 168 hours. Cardiac Enzymes: No results for input(s): "CKTOTAL", "CKMB", "CKMBINDEX", "TROPONINI" in the last 168 hours. BNP (last 3 results) No results for input(s): "PROBNP" in the last 8760 hours. HbA1C: No results for input(s): "HGBA1C" in the last 72 hours. CBG: No results for input(s): "GLUCAP" in the last 168 hours. Lipid Profile: No results for input(s): "CHOL", "HDL", "LDLCALC", "TRIG", "CHOLHDL", "LDLDIRECT" in the last 72 hours. Thyroid Function Tests: No results for input(s): "TSH", "T4TOTAL", "FREET4", "T3FREE", "THYROIDAB" in the last 72 hours. Anemia Panel: No results for input(s): "VITAMINB12", "FOLATE", "FERRITIN", "TIBC", "IRON", "RETICCTPCT" in the last 72 hours. Sepsis Labs: No results for input(s): "PROCALCITON", "LATICACIDVEN" in the last 168 hours.  No results found for this or any previous visit (from the past 240 hour(s)).       Radiology Studies: CT CHEST ABDOMEN PELVIS WO CONTRAST  Result Date: 05/21/2023 CLINICAL DATA:  Renal cell carcinoma and myeloma; * Tracking Code: BO * EXAM: CT CHEST, ABDOMEN AND PELVIS WITHOUT CONTRAST  TECHNIQUE: Multidetector CT imaging of the chest, abdomen and pelvis was performed following the standard protocol without IV contrast. RADIATION DOSE REDUCTION: This exam was performed according to the departmental dose-optimization program which includes automated exposure control, adjustment of the mA and/or kV according to patient size and/or use of iterative reconstruction technique. COMPARISON:  CT abdomen and pelvis dated May 05, 2023 FINDINGS: CT CHEST FINDINGS Cardiovascular: Normal heart size. No pericardial effusion. Normal caliber thoracic aorta with mild atherosclerotic disease. Mediastinum/Nodes: Esophagus and thyroid are unremarkable. No enlarged lymph nodes seen in the chest. Lungs/Pleura: Central airways are patent. Bibasilar atelectasis. No consolidation, pleural effusion or pneumothorax. Musculoskeletal: Numerous scattered lucent lesions seen involving the ribs, sternum, thoracic spine and bilateral clavicles. A large lucent lesion of the T6 vertebral body involvement of the posterior cortex, extension into the spinal canal can not be excluded. CT ABDOMEN PELVIS FINDINGS Hepatobiliary: Scattered small low-attenuation liver lesions which are too small to accurately characterize. Hypoattenuating lesions of the central liver located on series 2, image 51 and hepatic dome on image 46, previously characterized as benign hemangiomas. Gallbladder is unremarkable. Pancreas: Unremarkable. No pancreatic ductal dilatation or surrounding inflammatory changes. Spleen: Normal in size without focal abnormality. Adrenals/Urinary Tract: No adrenal nodules. Prior right nephrectomy. Left adrenal gland is unremarkable. No left hydronephrosis. Status post partial left nephrectomy. Simple appearing left renal cyst,  no specific follow-up imaging is recommended. Bladder is unremarkable. Stomach/Bowel: Stomach is within normal limits. Appendix appears normal. Mild diverticulosis. No evidence of bowel wall thickening,  distention, or inflammatory changes. Vascular/Lymphatic: No significant vascular findings are present. No enlarged abdominal or pelvic lymph nodes. Reproductive: Moderate prostatomegaly status post TURP. Other: Large hernia of the lateral right abdominal wall containing nondilated lead loops of small and large bowel. No abdominopelvic ascites. No intra-abdominal free air. Musculoskeletal: Numerous lytic osseous lesions seen throughout the thoracic spine and pelvis. Mild compression fracture of L4 with no evidence of retropulsion, unchanged when compared with the prior exam. IMPRESSION: 1. Numerous lytic osseous lesions, consistent with patient's history of multiple myeloma. A large lucent lesion of the T6 vertebral body involves the posterior cortex, extension into the spinal canal can not be excluded. Consider further evaluation with contrast-enhanced MRI of the thoracic spine. 2. Status post right nephrectomy no evidence of recurrent mass in the right nephrectomy bed. 3. No evidence of soft tissue metastatic disease in the chest, abdomen or pelvis. 4. Mild aortic Atherosclerosis (ICD10-I70.0). Electronically Signed   By: Allegra Lai M.D.   On: 05/21/2023 20:22   DG Bone Survey Met  Result Date: 05/21/2023 CLINICAL DATA:  Multiple myeloma EXAM: METASTATIC BONE SURVEY COMPARISON:  None Available. FINDINGS: There are multiple small round lucencies of varying sizes in calvarium. Sella appears largest unusual in size. There are few radiolucent lesions of varying sizes in the shaft of right humerus. Possible small low-density focus is seen in the mid shaft of right clavicle. There are radiolucencies in the distal shaft of left humerus. Small faint lucencies are seen in the proximal shaft of right radius and distal shaft of right ulna. Possible small faint lucency is seen in the distal shaft of left ulna. Degenerative changes are noted with bony spurs in cervical and thoracic spine. There are no focal infiltrates  in the lung fields. Deformities in the posterolateral aspects of right fifth and sixth ribs suggest old healed fractures. Possible small faint lucencies are seen in the posterior aspects of left fourth and sixth ribs. Few small lucencies are noted in left iliac bone and proximal right femur. There is linear lucency in the proximal shaft of right femur. Small faint lucency is seen in the distal shaft of right femur. There are few low-density lesions of varying sizes in the shaft of left femur. IMPRESSION: There are multiple radiolucencies of varying sizes in axial and appendicular skeleton as described in the body of the report consistent with multiple myeloma lesions. Electronically Signed   By: Ernie Avena M.D.   On: 05/21/2023 19:36   MR Brain W and Wo Contrast  Addendum Date: 05/20/2023   ADDENDUM REPORT: 05/20/2023 15:48 ADDENDUM: Lytic lesions have been identified previously with this patient. 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 Electronically Signed   By: Marin Roberts M.D.   On: 05/20/2023 15:48   Result Date: 05/20/2023 CLINICAL DATA:  Visual is turbinates to the left eye. Abnormal CT scan. EXAM: MRI HEAD WITHOUT AND WITH CONTRAST TECHNIQUE: Multiplanar, multiecho pulse sequences of the brain and surrounding structures were obtained without and with intravenous contrast. CONTRAST:  8mL GADAVIST GADOBUTROL 1 MMOL/ML IV SOLN COMPARISON:  CT of the head and maxillofacial 02/25/2021. FINDINGS: Brain: No acute infarct, hemorrhage, or mass lesion is present. Mild atrophy and white matter changes are likely within normal limits for age. The ventricles are of normal size. No significant extraaxial fluid collection is  present. Deep brain nuclei are within normal limits. Enhancing osseous tumor extends to the anterior margin of the right IAC. Internal auditory canals are otherwise within normal limits bilaterally. Inner ear structures are within normal limits.  Vascular: Insert normal flow Skull and upper cervical spine: A slightly heterogeneous enhancing mass lesion is again noted at the skull base. This extends along the petrous ridge bilaterally, greater on the right. Tumor surrounds the right internal carotid artery without definite stenosis. There is some heterogeneity to the tumor on precontrast T2 weighted imaging. The lesion measures 7.8 x 5.6 x 3.3 cm. Multiple additional enhancing lesions are present throughout the skull. Lesion in the anterior right frontal skull measures 11 mm on image 110 of series 17. A lesion in the left parietal skull on image 107 of series 17 measures 11 mm. Multiple other smaller lesions are present as well. Sinuses/Orbits: Minimal mucosal thickening scratched at minimal fluid is present in the right maxillary sinus. The paranasal sinuses are otherwise clear. A right mastoid effusion is present. The tumor impacts the right eustachian tube. Other: IMPRESSION: 1. 7.8 x 5.6 x 3.3 cm enhancing mass lesion at the skull base extending along the petrous ridge bilaterally, greater on the right. 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 There are some intrinsic T2 signal scratched at there is some intrinsic T2 signal and chordoma still considered. However, given the multiple other enhancing lesions throughout the skull, this most likely represents metastatic disease. 2. No acute intracranial abnormality. 3. Mild atrophy and white matter changes are likely within normal limits for age. 4. Right mastoid effusion secondary to obstruction of the eustachian tube. 5. Minimal right maxillary sinus disease. Electronically Signed: By: Marin Roberts M.D. On: 05/20/2023 15:33   MR ORBITS W WO CONTRAST  Result Date: 05/20/2023 CLINICAL DATA:  Visual disturbance to the left eye for 3 weeks. Vision loss, monocular. Skull base mass. EXAM: MRI OF THE ORBITS WITHOUT AND WITH CONTRAST TECHNIQUE: Multiplanar, multi-echo pulse sequences of the  orbits and surrounding structures were acquired including fat saturation techniques, before and after intravenous contrast administration. CONTRAST:  8mL GADAVIST GADOBUTROL 1 MMOL/ML IV SOLN COMPARISON:  CT head without contrast 05/20/2023. MR head without and with contrast 05/20/2023 FINDINGS: Enhancing skull base tumor centered in the clivus measuring 7.8 x 5.3 x 3.7 cm. Tumor extends along the petrous apex, right greater than left. The tumor surrounds the petrous right ICA. Tumor extends into the floor of the sella abuts the cavernous sinus bilaterally. Tumor invades the posterior ethmoid air cells and sphenoid sinuses bilaterally. Tumor extends to the left orbital apex and likely impacts the left optic nerve. The optic chiasm is within normal limits. Tumor extends into the nasal cavity more prominently on the left. Multiple additional foci of enhancement are present within the skull. Orbits: Bilateral lens replacements are present. The globes are within normal limits. The optic nerve is within normal limits within the canal bilaterally. Extraocular muscles are unremarkable. The superior ophthalmic vein is symmetric. Visualized sinuses: Tumor extends into the posterior ethmoid air cells and sphenoid sinuses as described. A right mastoid effusion is secondary to obstruction. Soft tissues: Extracranial soft tissues are unremarkable. Limited intracranial: Visualized intracranial contents are within normal limits. IMPRESSION: 1. Enhancing skull base tumor centered in the clivus measuring 7.8 x 5.3 x 3.7 cm. Tumor extends along the petrous apex, right greater than left. The tumor surrounds the petrous right ICA. Tumor extends into the floor of the sella abuts the cavernous  sinus bilaterally. Tumor invades the posterior ethmoid air cells and sphenoid sinuses bilaterally. Tumor extends to the left orbital apex and likely impacts the left optic nerve. Based on the initial CT, consideration of chordoma was made. Although  there is some intrinsic T2 signal and heterogeneous enhancement, the multiple other enhancing lesions suggest this is most likely metastatic disease. Tumor extending into the nasal cavity may be assessable for biopsy. 2. Tumor extends into the nasal cavity more prominently on the left. 3. Right mastoid effusion is secondary to obstruction. Electronically Signed   By: Marin Roberts M.D.   On: 05/20/2023 15:41   CT Head Wo Contrast  Result Date: 05/20/2023 CLINICAL DATA:  Vision loss. EXAM: CT HEAD WITHOUT CONTRAST TECHNIQUE: Contiguous axial images were obtained from the base of the skull through the vertex without intravenous contrast. RADIATION DOSE REDUCTION: This exam was performed according to the departmental dose-optimization program which includes automated exposure control, adjustment of the mA and/or kV according to patient size and/or use of iterative reconstruction technique. COMPARISON:  02/25/2021. FINDINGS: Brain: Large, hyperattenuating mass enlarges and erodes the clivus, extending through the sella turcica, obliterating the sphenoid sinuses and extending into the posterior ethmoid air cells. Mass diffusely involves the cavernous sinuses, extending more laterally on the left than on the right. The mass broadly contacts the optic nerves from the orbital apices to anterior to the optic chiasm. Mass measures 6.2 cm from right to left, 4 cm from superior to inferior and 5.2 cm anterior to posterior. No other extra-axial masses. No intra-axial masses. No evidence of an infarct and no intracranial hemorrhage. No hydrocephalus. Vascular: No hyperdense vessel or unexpected calcification. Mass broadly involves both cavernous sinuses as detailed above. Skull: In addition to the clivus, mass erodes the petrous portions of both temporal bones as well as the sphenoid bone. Sinuses/Orbits: No abnormality of either globe. No intraorbital abnormality. Sinuses unaffected by the mass are clear. Other: None.  IMPRESSION: 1. Large mass at the skull base, eroding and expanding the clivus, also involving the sphenoid bone and the petrous portions of both temporal bones. The mass broadly abuts both optic nerves between the chiasm and orbital apices. Mass also broadly involves both cavernous sinuses. 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 Recommend further assessment with brain MRI without and with contrast. 2. No acute finding. No evidence of an infarct, hydrocephalus or intracranial hemorrhage. Electronically Signed   By: Amie Portland M.D.   On: 05/20/2023 10:22        Scheduled Meds:  aspirin EC  81 mg Oral Daily   carvedilol  6.25 mg Oral BID   dexamethasone (DECADRON) injection  5 mg Intravenous Q6H   docusate sodium  100 mg Oral BID   enoxaparin (LOVENOX) injection  40 mg Subcutaneous Q24H   finasteride  5 mg Oral Daily   tamsulosin  0.4 mg Oral QPC supper   Continuous Infusions:  sodium chloride 75 mL/hr at 05/22/23 0458          [name], MD [facility] 05/22/2023, 7:45 AM

## 2023-05-22 NOTE — Progress Notes (Addendum)
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  He notes that his vision may be slightly better with the steroid and he is tolerating the steroids well.  Had some mild insomnia but it was not too bothersome. Has been seen by radiation oncology and is going to be set up for CT simulation and to start radiation tomorrow.  Did have a CT chest abdomen pelvis without contrast to evaluate for any other soft tissue metastatic disease and whole-body skeletal survey to determine other overt progression and to rule out the possibility of further source of metastatic disease.  Discussed goals of care in detail again with the patient with his family members at bedside.  Patient notes that he is inclined to pursue additional treatments for his myeloma as indicated.  OBJECTIVE:  NAD  PHYSICAL EXAMINATION: . Vitals:   05/21/23 0015 05/21/23 0415 05/21/23 1401 05/21/23 2010  BP: 121/77 127/77 128/80 (!) 144/82  Pulse: 68 63 79 89  Resp: 16 16  16   Temp: 97.8 F (36.6 C) 97.6 F (36.4 C) 98 F (36.7 C) 98.5 F (36.9 C)  TempSrc: Oral Oral Oral Oral  SpO2: 100% 100% 100% 100%  Weight:      Height:       Filed Weights   05/20/23 0946 05/20/23 1603  Weight: 180 lb 12.4 oz (82 kg) 174 lb 6.1 oz (79.1 kg)   .Body mass index is 25.75 kg/m.  GENERAL:alert, in no acute distress and comfortable SKIN: skin color, texture, turgor are normal, no rashes or significant lesions EYES: normal, conjunctiva are pink and non-injected, sclera clear OROPHARYNX:no exudate, no erythema and lips, buccal mucosa, and tongue normal  NECK: supple, no JVD, thyroid normal size, non-tender, without nodularity LYMPH:  no palpable lymphadenopathy in the cervical, axillary or inguinal LUNGS: clear to auscultation with normal  respiratory effort HEART: regular rate & rhythm,  no murmurs and no lower extremity edema ABDOMEN: abdomen soft, non-tender, normoactive bowel sounds  Musculoskeletal: no cyanosis of digits and no clubbing  PSYCH: alert & oriented x 3 with fluent speech NEURO: no focal motor/sensory deficits  MEDICAL HISTORY:  Past Medical History:  Diagnosis Date   Arthritis    CKD (chronic kidney disease) stage 3, GFR 30-59 ml/min (HCC) 20015   Creat 1.9   Hypertension    Multiple myeloma (HCC) 2016   [facility]   Prostate disorder 02/2017    SURGICAL HISTORY: Past Surgical History:  Procedure Laterality Date   EYE SURGERY Bilateral 01/30/2018   KNEE SURGERY     over ten years ago   NEPHRECTOMY Right 03/2015   ROBOTIC ASSITED PARTIAL NEPHRECTOMY Left 07/07/2021   Procedure: XI ROBOTIC ASSITED PARTIAL NEPHRECTOMY;  Surgeon: [name], MD;  Location: [facility];  Service: Urology;  Laterality: Left;  5 HRS   XI ROBOTIC ASSISTED SIMPLE PROSTATECTOMY N/A 07/07/2021   Procedure: XI ROBOTIC ASSISTED SIMPLE PROSTATECTOMY;  Surgeon: [name], MD;  Location: [facility];  Service: Urology;  Laterality: N/A;    SOCIAL HISTORY: Social History   Socioeconomic History   Marital status: Married    Spouse name: Museum/gallery curator   Number of children: 2   Years of education: Not on file   Highest education level: Not on file  Occupational History   Occupation: Retired  Tobacco Use   Smoking status:  Never   Smokeless tobacco: Never  Vaping Use   Vaping Use: Never used  Substance and Sexual Activity   Alcohol use: No   Drug use: No   Sexual activity: Not on file  Other Topics Concern   Not on file  Social History Narrative   Not on file   Social Determinants of Health   Financial Resource Strain: Not on file  Food Insecurity: No Food Insecurity (05/20/2023)   Hunger Vital Sign    Worried About Running Out of Food in the Last Year: Never true    Ran Out of Food in the Last Year: Never  true  Transportation Needs: No Transportation Needs (05/20/2023)   PRAPARE - Administrator, Civil Service (Medical): No    Lack of Transportation (Non-Medical): No  Physical Activity: Not on file  Stress: Not on file  Social Connections: Not on file  Intimate Partner Violence: Not At Risk (05/20/2023)   Humiliation, Afraid, Rape, and Kick questionnaire    Fear of Current or Ex-Partner: No    Emotionally Abused: No    Physically Abused: No    Sexually Abused: No    FAMILY HISTORY: Family History  Problem Relation Age of Onset   Diabetes Father    Hypertension Brother     ALLERGIES:  is allergic to penicillins, latex, and tape.  MEDICATIONS:  Scheduled Meds:  aspirin EC  81 mg Oral Daily   carvedilol  6.25 mg Oral BID   dexamethasone (DECADRON) injection  5 mg Intravenous Q6H   docusate sodium  100 mg Oral BID   enoxaparin (LOVENOX) injection  40 mg Subcutaneous Q24H   finasteride  5 mg Oral Daily   tamsulosin  0.4 mg Oral QPC supper   Continuous Infusions: PRN Meds:.fluticasone  REVIEW OF SYSTEMS:    10 Point review of Systems was done is negative except as noted above.  LABORATORY DATA:  I have reviewed the data as listed  .    Latest Ref Rng & Units 05/21/2023    5:33 AM 05/20/2023   11:48 AM 07/10/2021    8:49 AM  CBC  WBC 4.0 - 10.5 K/uL 5.2  5.3    Hemoglobin 13.0 - 17.0 g/dL 9.9  16.1  8.7   Hematocrit 39.0 - 52.0 % 30.3  34.6  27.0   Platelets 150 - 400 K/uL 242  266      .    Latest Ref Rng & Units 05/21/2023    5:33 AM 05/20/2023   11:48 AM 07/09/2021    4:24 AM  CMP  Glucose 70 - 99 mg/dL 096  96  045   BUN 8 - 23 mg/dL 30  26  21    Creatinine 0.61 - 1.24 mg/dL 4.09  8.11  9.14   Sodium 135 - 145 mmol/L 126  129  135   Potassium 3.5 - 5.1 mmol/L 4.5  4.6  4.6   Chloride 98 - 111 mmol/L 102  98  109   CO2 22 - 32 mmol/L 20  24  22    Calcium 8.9 - 10.3 mg/dL 8.3  9.0  7.5   Total Protein 6.5 - 8.1 g/dL 9.8     Total Bilirubin 0.3 - 1.2  mg/dL 0.4     Alkaline Phos 38 - 126 U/L 44     AST 15 - 41 U/L 15     ALT 0 - 44 U/L 17        RADIOGRAPHIC STUDIES: I  have personally reviewed the radiological images as listed and agreed with the findings in the report. CT CHEST ABDOMEN PELVIS WO CONTRAST  Result Date: 05/21/2023 CLINICAL DATA:  Renal cell carcinoma and myeloma; * Tracking Code: BO * EXAM: CT CHEST, ABDOMEN AND PELVIS WITHOUT CONTRAST TECHNIQUE: Multidetector CT imaging of the chest, abdomen and pelvis was performed following the standard protocol without IV contrast. RADIATION DOSE REDUCTION: This exam was performed according to the departmental dose-optimization program which includes automated exposure control, adjustment of the mA and/or kV according to patient size and/or use of iterative reconstruction technique. COMPARISON:  CT abdomen and pelvis dated May 05, 2023 FINDINGS: CT CHEST FINDINGS Cardiovascular: Normal heart size. No pericardial effusion. Normal caliber thoracic aorta with mild atherosclerotic disease. Mediastinum/Nodes: Esophagus and thyroid are unremarkable. No enlarged lymph nodes seen in the chest. Lungs/Pleura: Central airways are patent. Bibasilar atelectasis. No consolidation, pleural effusion or pneumothorax. Musculoskeletal: Numerous scattered lucent lesions seen involving the ribs, sternum, thoracic spine and bilateral clavicles. A large lucent lesion of the T6 vertebral body involvement of the posterior cortex, extension into the spinal canal can not be excluded. CT ABDOMEN PELVIS FINDINGS Hepatobiliary: Scattered small low-attenuation liver lesions which are too small to accurately characterize. Hypoattenuating lesions of the central liver located on series 2, image 51 and hepatic dome on image 46, previously characterized as benign hemangiomas. Gallbladder is unremarkable. Pancreas: Unremarkable. No pancreatic ductal dilatation or surrounding inflammatory changes. Spleen: Normal in size without focal  abnormality. Adrenals/Urinary Tract: No adrenal nodules. Prior right nephrectomy. Left adrenal gland is unremarkable. No left hydronephrosis. Status post partial left nephrectomy. Simple appearing left renal cyst, no specific follow-up imaging is recommended. Bladder is unremarkable. Stomach/Bowel: Stomach is within normal limits. Appendix appears normal. Mild diverticulosis. No evidence of bowel wall thickening, distention, or inflammatory changes. Vascular/Lymphatic: No significant vascular findings are present. No enlarged abdominal or pelvic lymph nodes. Reproductive: Moderate prostatomegaly status post TURP. Other: Large hernia of the lateral right abdominal wall containing nondilated lead loops of small and large bowel. No abdominopelvic ascites. No intra-abdominal free air. Musculoskeletal: Numerous lytic osseous lesions seen throughout the thoracic spine and pelvis. Mild compression fracture of L4 with no evidence of retropulsion, unchanged when compared with the prior exam. IMPRESSION: 1. Numerous lytic osseous lesions, consistent with patient's history of multiple myeloma. A large lucent lesion of the T6 vertebral body involves the posterior cortex, extension into the spinal canal can not be excluded. Consider further evaluation with contrast-enhanced MRI of the thoracic spine. 2. Status post right nephrectomy no evidence of recurrent mass in the right nephrectomy bed. 3. No evidence of soft tissue metastatic disease in the chest, abdomen or pelvis. 4. Mild aortic Atherosclerosis (ICD10-I70.0). Electronically Signed   By: Allegra Lai M.D.   On: 05/21/2023 20:22   DG Bone Survey Met  Result Date: 05/21/2023 CLINICAL DATA:  Multiple myeloma EXAM: METASTATIC BONE SURVEY COMPARISON:  None Available. FINDINGS: There are multiple small round lucencies of varying sizes in calvarium. Sella appears largest unusual in size. There are few radiolucent lesions of varying sizes in the shaft of right humerus.  Possible small low-density focus is seen in the mid shaft of right clavicle. There are radiolucencies in the distal shaft of left humerus. Small faint lucencies are seen in the proximal shaft of right radius and distal shaft of right ulna. Possible small faint lucency is seen in the distal shaft of left ulna. Degenerative changes are noted with bony spurs in cervical and thoracic spine.  There are no focal infiltrates in the lung fields. Deformities in the posterolateral aspects of right fifth and sixth ribs suggest old healed fractures. Possible small faint lucencies are seen in the posterior aspects of left fourth and sixth ribs. Few small lucencies are noted in left iliac bone and proximal right femur. There is linear lucency in the proximal shaft of right femur. Small faint lucency is seen in the distal shaft of right femur. There are few low-density lesions of varying sizes in the shaft of left femur. IMPRESSION: There are multiple radiolucencies of varying sizes in axial and appendicular skeleton as described in the body of the report consistent with multiple myeloma lesions. Electronically Signed   By: Ernie Avena M.D.   On: 05/21/2023 19:36   MR Brain W and Wo Contrast  Addendum Date: 05/20/2023   ADDENDUM REPORT: 05/20/2023 15:48 ADDENDUM: Lytic lesions have been identified previously with this patient. 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 Electronically Signed   By: Marin Roberts M.D.   On: 05/20/2023 15:48   Result Date: 05/20/2023 CLINICAL DATA:  Visual is turbinates to the left eye. Abnormal CT scan. EXAM: MRI HEAD WITHOUT AND WITH CONTRAST TECHNIQUE: Multiplanar, multiecho pulse sequences of the brain and surrounding structures were obtained without and with intravenous contrast. CONTRAST:  8mL GADAVIST GADOBUTROL 1 MMOL/ML IV SOLN COMPARISON:  CT of the head and maxillofacial 02/25/2021. FINDINGS: Brain: No acute infarct, hemorrhage, or mass  lesion is present. Mild atrophy and white matter changes are likely within normal limits for age. The ventricles are of normal size. No significant extraaxial fluid collection is present. Deep brain nuclei are within normal limits. Enhancing osseous tumor extends to the anterior margin of the right IAC. Internal auditory canals are otherwise within normal limits bilaterally. Inner ear structures are within normal limits. Vascular: Insert normal flow Skull and upper cervical spine: A slightly heterogeneous enhancing mass lesion is again noted at the skull base. This extends along the petrous ridge bilaterally, greater on the right. Tumor surrounds the right internal carotid artery without definite stenosis. There is some heterogeneity to the tumor on precontrast T2 weighted imaging. The lesion measures 7.8 x 5.6 x 3.3 cm. Multiple additional enhancing lesions are present throughout the skull. Lesion in the anterior right frontal skull measures 11 mm on image 110 of series 17. A lesion in the left parietal skull on image 107 of series 17 measures 11 mm. Multiple other smaller lesions are present as well. Sinuses/Orbits: Minimal mucosal thickening scratched at minimal fluid is present in the right maxillary sinus. The paranasal sinuses are otherwise clear. A right mastoid effusion is present. The tumor impacts the right eustachian tube. Other: IMPRESSION: 1. 7.8 x 5.6 x 3.3 cm enhancing mass lesion at the skull base extending along the petrous ridge bilaterally, greater on the right. 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 There are some intrinsic T2 signal scratched at there is some intrinsic T2 signal and chordoma still considered. However, given the multiple other enhancing lesions throughout the skull, this most likely represents metastatic disease. 2. No acute intracranial abnormality. 3. Mild atrophy and white matter changes are likely within normal limits for age. 4. Right mastoid effusion secondary  to obstruction of the eustachian tube. 5. Minimal right maxillary sinus disease. Electronically Signed: By: Marin Roberts M.D. On: 05/20/2023 15:33   MR ORBITS W WO CONTRAST  Result Date: 05/20/2023 CLINICAL DATA:  Visual disturbance to the left eye  for 3 weeks. Vision loss, monocular. Skull base mass. EXAM: MRI OF THE ORBITS WITHOUT AND WITH CONTRAST TECHNIQUE: Multiplanar, multi-echo pulse sequences of the orbits and surrounding structures were acquired including fat saturation techniques, before and after intravenous contrast administration. CONTRAST:  8mL GADAVIST GADOBUTROL 1 MMOL/ML IV SOLN COMPARISON:  CT head without contrast 05/20/2023. MR head without and with contrast 05/20/2023 FINDINGS: Enhancing skull base tumor centered in the clivus measuring 7.8 x 5.3 x 3.7 cm. Tumor extends along the petrous apex, right greater than left. The tumor surrounds the petrous right ICA. Tumor extends into the floor of the sella abuts the cavernous sinus bilaterally. Tumor invades the posterior ethmoid air cells and sphenoid sinuses bilaterally. Tumor extends to the left orbital apex and likely impacts the left optic nerve. The optic chiasm is within normal limits. Tumor extends into the nasal cavity more prominently on the left. Multiple additional foci of enhancement are present within the skull. Orbits: Bilateral lens replacements are present. The globes are within normal limits. The optic nerve is within normal limits within the canal bilaterally. Extraocular muscles are unremarkable. The superior ophthalmic vein is symmetric. Visualized sinuses: Tumor extends into the posterior ethmoid air cells and sphenoid sinuses as described. A right mastoid effusion is secondary to obstruction. Soft tissues: Extracranial soft tissues are unremarkable. Limited intracranial: Visualized intracranial contents are within normal limits. IMPRESSION: 1. Enhancing skull base tumor centered in the clivus measuring 7.8 x 5.3 x  3.7 cm. Tumor extends along the petrous apex, right greater than left. The tumor surrounds the petrous right ICA. Tumor extends into the floor of the sella abuts the cavernous sinus bilaterally. Tumor invades the posterior ethmoid air cells and sphenoid sinuses bilaterally. Tumor extends to the left orbital apex and likely impacts the left optic nerve. Based on the initial CT, consideration of chordoma was made. Although there is some intrinsic T2 signal and heterogeneous enhancement, the multiple other enhancing lesions suggest this is most likely metastatic disease. Tumor extending into the nasal cavity may be assessable for biopsy. 2. Tumor extends into the nasal cavity more prominently on the left. 3. Right mastoid effusion is secondary to obstruction. Electronically Signed   By: Marin Roberts M.D.   On: 05/20/2023 15:41   CT Head Wo Contrast  Result Date: 05/20/2023 CLINICAL DATA:  Vision loss. EXAM: CT HEAD WITHOUT CONTRAST TECHNIQUE: Contiguous axial images were obtained from the base of the skull through the vertex without intravenous contrast. RADIATION DOSE REDUCTION: This exam was performed according to the departmental dose-optimization program which includes automated exposure control, adjustment of the mA and/or kV according to patient size and/or use of iterative reconstruction technique. COMPARISON:  02/25/2021. FINDINGS: Brain: Large, hyperattenuating mass enlarges and erodes the clivus, extending through the sella turcica, obliterating the sphenoid sinuses and extending into the posterior ethmoid air cells. Mass diffusely involves the cavernous sinuses, extending more laterally on the left than on the right. The mass broadly contacts the optic nerves from the orbital apices to anterior to the optic chiasm. Mass measures 6.2 cm from right to left, 4 cm from superior to inferior and 5.2 cm anterior to posterior. No other extra-axial masses. No intra-axial masses. No evidence of an infarct  and no intracranial hemorrhage. No hydrocephalus. Vascular: No hyperdense vessel or unexpected calcification. Mass broadly involves both cavernous sinuses as detailed above. Skull: In addition to the clivus, mass erodes the petrous portions of both temporal bones as well as the sphenoid bone. Sinuses/Orbits: No abnormality of  either globe. No intraorbital abnormality. Sinuses unaffected by the mass are clear. Other: None. IMPRESSION: 1. Large mass at the skull base, eroding and expanding the clivus, also involving the sphenoid bone and the petrous portions of both temporal bones. The mass broadly abuts both optic nerves between the chiasm and orbital apices. Mass also broadly involves both cavernous sinuses. 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 Recommend further assessment with brain MRI without and with contrast. 2. No acute finding. No evidence of an infarct, hydrocephalus or intracranial hemorrhage. Electronically Signed   By: Amie Portland M.D.   On: 05/20/2023 10:22   DG Chest 2 View  Result Date: 05/06/2023 CLINICAL DATA:  77 year old male with history of right clavicular pain. EXAM: CHEST - 2 VIEW COMPARISON:  Chest x-ray 05/02/2023. FINDINGS: Mildly displaced fracture of the medial right clavicle. Lucency in the region of the medial right clavicle suggesting an underlying bony lesion. Old healed fractures of the lateral right fifth and sixth ribs with abundant callus formation. Lung volumes are normal. No consolidative airspace disease. No pleural effusions. No pneumothorax. No pulmonary nodule or mass noted. Pulmonary vasculature and the cardiomediastinal silhouette are within normal limits. IMPRESSION: 1.  No radiographic evidence of acute cardiopulmonary disease. 2. Probable pathologic fracture of the medial right clavicle better demonstrated on dedicated right clavicular radiographs. 3. Old healed fractures of the  lateral right fifth and sixth ribs. Electronically Signed   By: Trudie Reed M.D.   On: 05/06/2023 09:56   DG Clavicle Right  Result Date: 05/06/2023 CLINICAL DATA:  77 year old male with history of pain in the region of the right clavicle. EXAM: RIGHT CLAVICLE - 2+ VIEWS COMPARISON:  No priors. FINDINGS: Multiple lucent lesions in the right clavicle, which could reflect metastatic lesions in this patient with history of renal cell carcinoma, or could be myelomatous lesions. Mildly displaced fracture of the medial right clavicle noted with approximately 8 mm of inferior displacement of the distal aspect of the clavicle. IMPRESSION: 1. Mildly displaced likely pathologic fracture of the medial right clavicle, as above. Multiple additional lytic lesions in the clavicle could represent myelomatous lesions or metastatic lesions in this patient with history of renal cell carcinoma. Electronically Signed   By: Trudie Reed M.D.   On: 05/06/2023 09:54   DG Shoulder Right  Result Date: 05/06/2023 CLINICAL DATA:  Right shoulder pain. EXAM: RIGHT SHOULDER - 3 VIEW COMPARISON:  None Available. FINDINGS: No acute fracture or dislocation. Degenerative spurring at the acromioclavicular joint. Few subtle lucent areas are seen over the humeral diaphysis (with endosteal scalloping) and at the mid clavicle. Remote fifth and sixth rib fractures with hypertrophic callus. There is known history of multiple myeloma with lesion seen on chest CT from 09/26/2022 IMPRESSION: 1. No acute finding. 2. Known myeloma. Electronically Signed   By: Tiburcio Pea M.D.   On: 05/06/2023 08:41   DG Chest 2 View  Result Date: 05/05/2023 CLINICAL DATA:  chest xray EXAM: CHEST - 2 VIEW COMPARISON:  04/19/2022 FINDINGS: Cardiac silhouette is unremarkable. No pneumothorax or pleural effusion. The lungs are clear. Thoracic degenerative changes noted. Chronic posterior right-sided fifth and sixth rib deformities noted. IMPRESSION: No acute  cardiopulmonary process. Electronically Signed   By: Layla Maw M.D.   On: 05/05/2023 22:27    ASSESSMENT & PLAN:    77 year old male with   #1 Progressive IgG kappa myeloma: Initially diagnosed in 2016  as stage II disease with standard risk cytogenetics today. 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 Status post treatment as noted above. Last seen by Dr. Lalla Brothers at [facility] in January 2024 and noted to have progressive disease with increase in his IgG kappa M spike at 2.87 g/dL.   Recent light chains done on 05/20/2023 at [facility] showed increase in free kappa light chains to 603 with a kappa lambda ratio of 25 this is up from kappa light chains of 402 with a kappa lambda ratio of 12.6 on 01/03/2023. CBC shows relatively stable hemoglobin of 11.3 with normal WBC count and platelets BMP shows stable chronic kidney disease with a creatinine of 1.87.  No overt hypercalcemia with a calcium of 9   #2 recent right clavicular pain with x-ray on 05/06/2023 showing mildly displaced likely pathologic fracture of the medial right clavicle.  Multiple other lytic lesions noted.   #3 current hospitalization with visual difficulty and loss of vision referred to the emergency room by his ophthalmologist. CT head done today 05/20/2023 shows  Large mass at the skull base, eroding and expanding the clivus, also involving the sphenoid bone and the petrous portions of both temporal bones. The mass broadly abuts both optic nerves between the chiasm and orbital apices. Mass also broadly involves both cavernous sinuses. 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 Recommend further assessment with brain MRI without and with contrast.   MRI brain 05/20/2023 1. 7.8 x 5.6 x 3.3 cm enhancing mass lesion at the skull base extending along the petrous ridge bilaterally, greater on the right. Diagnosis  chordoma was suggested on the basis of CT. There are some intrinsic T2 signal scratched at there is some intrinsic T2 signal and chordoma still considered. However, given the multiple other enhancing lesions throughout the skull, this most likely represents metastatic disease. 2. No acute intracranial abnormality. 3. Mild atrophy and white matter changes are likely within normal limits for age. 4. Right mastoid effusion secondary to obstruction of the eustachian tube. 5. Minimal right maxillary sinus disease.   ADDENDUM: Lytic lesions have been identified previously with this patient. 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   #4 hyponatremia   #5 history of renal cell carcinoma right nephrectomy on 02/03/2016.  He received cryotherapy to his left renal mass in April 2017. Surgically resected by Dr. [name] at [facility] on July 07, 2021. 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 Tumor is limited to the kidney (pT1a).   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 Patient oncology input appreciated.  CT simulation and radiation will be started tomorrow. CT chest abdomen pelvis reviewed and shows no soft tissue metastasis.  She has extensive bone metastasis consistent with multiple myeloma. There is a large lucent T6 lesion potentially extending into the spinal canal. -Radiation oncology could consider possible radiation to the T6 lesion and possibly to the right clavicle where the patient has a pathologic fracture causing pain. -Plan to set him up to start second line treatment with daratumumab Revlimid dexamethasone as outpatient. -He will also need to be on bisphosphonates or Xgeva for bone directed therapy preferably after dental clearance. -Continue  current  steroids. -Could possibly discharge in the next couple of days after starting radiation and will need to discharge on 4 mg p.o. twice daily of steroids with further tapering as per radiation oncology. -Discussed goals of care in detail with the patient and his wife and children at bedside.  He was offered option of possibly pursuing best supportive care to hospice versus additional myeloma treatments and he prefers to pursue additional myeloma treatments. -PSA levels are within normal limits and do not suggest any evidence of prostate cancer. -CT chest abdomen pelvis does not suggest pattern for metastatic renal cell carcinoma and is consistent with multiple myeloma. -Oncology will continue to follow  The total time spent in the appointment was 35 minutes*.  All of the patient's questions were answered with apparent satisfaction. The patient knows to call the clinic with any problems, questions or concerns.   Wyvonnia Lora MD MS AAHIVMS [facility] Hematology/Oncology Physician [facility]  .*Total Encounter Time as defined by the Centers for Medicare and Medicaid Services includes, in addition to the face-to-face time of a patient visit (documented in the note above) non-face-to-face time: obtaining and reviewing outside history, ordering and reviewing medications, tests or procedures, care coordination (communications with other health care professionals or caregivers) and documentation in the medical record.

## 2023-05-22 NOTE — Progress Notes (Signed)
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 Patient notes his vision and his right clavicle pain has been improving after starting steroids. He denies bone pain, chest pain, or lower/middle back pain. Patient rates his right clavicle pain as 2/10 during this visit.  We discussed his CT chest abdomen pelvis and skeletal survey results in detail. He is tolerating his radiation therapy well.  He is agreeable to follow-up as outpatient for treatments.   OBJECTIVE:  NAD  PHYSICAL EXAMINATION: . Vitals:   05/21/23 0015 05/21/23 0415 05/21/23 1401 05/21/23 2010  BP: 121/77 127/77 128/80 (!) 144/82  Pulse: 68 63 79 89  Resp: 16 16  16   Temp: 97.8 F (36.6 C) 97.6 F (36.4 C) 98 F (36.7 C) 98.5 F (36.9 C)  TempSrc: Oral Oral Oral Oral  SpO2: 100% 100% 100% 100%  Weight:      Height:       Filed Weights   05/20/23 0946 05/20/23 1603  Weight: 180 lb 12.4 oz (82 kg) 174 lb 6.1 oz (79.1 kg)   .Body mass index is 25.75 kg/m.  GENERAL:alert, in no acute distress and comfortable NECK: supple, no JVD, thyroid normal size, non-tender, without nodularity LYMPH:  no palpable lymphadenopathy in the cervical, axillary or inguinal LUNGS: clear to auscultation with normal respiratory effort HEART: regular rate & rhythm,  no murmurs and no lower extremity edema ABDOMEN: abdomen soft, non-tender, normoactive bowel sounds    MEDICAL HISTORY:  Past Medical History:  Diagnosis Date   Arthritis    CKD (chronic kidney disease) stage 3, GFR 30-59 ml/min (HCC) 20015   Creat 1.9   Hypertension    Multiple myeloma (HCC) 2016   [facility] heme onc   Prostate disorder 02/2017    SURGICAL HISTORY: Past Surgical History:  Procedure Laterality Date   EYE SURGERY Bilateral 01/30/2018   KNEE SURGERY      over ten years ago   NEPHRECTOMY Right 03/2015   ROBOTIC ASSITED PARTIAL NEPHRECTOMY Left 07/07/2021   Procedure: XI ROBOTIC ASSITED PARTIAL NEPHRECTOMY;  Surgeon: [name], MD;  Location: [facility];  Service: Urology;  Laterality: Left;  5 HRS   XI ROBOTIC ASSISTED SIMPLE PROSTATECTOMY N/A 07/07/2021   Procedure: XI ROBOTIC ASSISTED SIMPLE PROSTATECTOMY;  Surgeon: [name], MD;  Location: [facility];  Service: Urology;  Laterality: N/A;    SOCIAL HISTORY: Social History   Socioeconomic History   Marital status: Married    Spouse name: Museum/gallery curator   Number of children: 2   Years of education: Not on file   Highest education level: Not on file  Occupational History   Occupation: Retired  Tobacco Use   Smoking status: Never   Smokeless tobacco: Never  Vaping Use   Vaping Use: Never used  Substance and Sexual Activity   Alcohol use: No   Drug use: No   Sexual activity: Not on file  Other Topics Concern   Not on file  Social History Narrative   Not on file   Social Determinants of Health   Financial Resource Strain: Not on file  Food Insecurity: No Food Insecurity (05/20/2023)   Hunger Vital Sign    Worried About Running Out of Food in the Last Year: Never true    Ran Out of Food in the Last Year: Never true  Transportation Needs:  No Transportation Needs (05/20/2023)   PRAPARE - Administrator, Civil Service (Medical): No    Lack of Transportation (Non-Medical): No  Physical Activity: Not on file  Stress: Not on file  Social Connections: Not on file  Intimate Partner Violence: Not At Risk (05/20/2023)   Humiliation, Afraid, Rape, and Kick questionnaire    Fear of Current or Ex-Partner: No    Emotionally Abused: No    Physically Abused: No    Sexually Abused: No    FAMILY HISTORY: Family History  Problem Relation Age of Onset   Diabetes Father    Hypertension Brother     ALLERGIES:  is allergic to penicillins, latex, and tape.  MEDICATIONS:   Scheduled Meds:  aspirin EC  81 mg Oral Daily   carvedilol  6.25 mg Oral BID   dexamethasone (DECADRON) injection  5 mg Intravenous Q6H   docusate sodium  100 mg Oral BID   enoxaparin (LOVENOX) injection  40 mg Subcutaneous Q24H   finasteride  5 mg Oral Daily   tamsulosin  0.4 mg Oral QPC supper   Continuous Infusions: PRN Meds:.fluticasone  REVIEW OF SYSTEMS:    10 Point review of Systems was done is negative except as noted above.  LABORATORY DATA:  I have reviewed the data as listed  .    Latest Ref Rng & Units 05/21/2023    5:33 AM 05/20/2023   11:48 AM 07/10/2021    8:49 AM  CBC  WBC 4.0 - 10.5 K/uL 5.2  5.3    Hemoglobin 13.0 - 17.0 g/dL 9.9  40.9  8.7   Hematocrit 39.0 - 52.0 % 30.3  34.6  27.0   Platelets 150 - 400 K/uL 242  266      .    Latest Ref Rng & Units 05/23/2023    5:41 AM 05/22/2023    5:44 AM 05/21/2023    5:33 AM  CMP  Glucose 70 - 99 mg/dL 811  914  782   BUN 8 - 23 mg/dL 33  32  30   Creatinine 0.61 - 1.24 mg/dL 9.56  2.13  0.86   Sodium 135 - 145 mmol/L 132  128  126   Potassium 3.5 - 5.1 mmol/L 4.4  4.4  4.5   Chloride 98 - 111 mmol/L 108  104  102   CO2 22 - 32 mmol/L 21  20  20    Calcium 8.9 - 10.3 mg/dL 7.9  7.8  8.3   Total Protein 6.5 - 8.1 g/dL   9.8   Total Bilirubin 0.3 - 1.2 mg/dL   0.4   Alkaline Phos 38 - 126 U/L   44   AST 15 - 41 U/L   15   ALT 0 - 44 U/L   17      RADIOGRAPHIC STUDIES: I have personally reviewed the radiological images as listed and agreed with the findings in the report. CT CHEST ABDOMEN PELVIS WO CONTRAST  Result Date: 05/21/2023 CLINICAL DATA:  Renal cell carcinoma and myeloma; * Tracking Code: BO * EXAM: CT CHEST, ABDOMEN AND PELVIS WITHOUT CONTRAST TECHNIQUE: Multidetector CT imaging of the chest, abdomen and pelvis was performed following the standard protocol without IV contrast. RADIATION DOSE REDUCTION: This exam was performed according to the departmental dose-optimization program which includes  automated exposure control, adjustment of the mA and/or kV according to patient size and/or use of iterative reconstruction technique. COMPARISON:  CT abdomen and pelvis dated May 05, 2023 FINDINGS:  CT CHEST FINDINGS Cardiovascular: Normal heart size. No pericardial effusion. Normal caliber thoracic aorta with mild atherosclerotic disease. Mediastinum/Nodes: Esophagus and thyroid are unremarkable. No enlarged lymph nodes seen in the chest. Lungs/Pleura: Central airways are patent. Bibasilar atelectasis. No consolidation, pleural effusion or pneumothorax. Musculoskeletal: Numerous scattered lucent lesions seen involving the ribs, sternum, thoracic spine and bilateral clavicles. A large lucent lesion of the T6 vertebral body involvement of the posterior cortex, extension into the spinal canal can not be excluded. CT ABDOMEN PELVIS FINDINGS Hepatobiliary: Scattered small low-attenuation liver lesions which are too small to accurately characterize. Hypoattenuating lesions of the central liver located on series 2, image 51 and hepatic dome on image 46, previously characterized as benign hemangiomas. Gallbladder is unremarkable. Pancreas: Unremarkable. No pancreatic ductal dilatation or surrounding inflammatory changes. Spleen: Normal in size without focal abnormality. Adrenals/Urinary Tract: No adrenal nodules. Prior right nephrectomy. Left adrenal gland is unremarkable. No left hydronephrosis. Status post partial left nephrectomy. Simple appearing left renal cyst, no specific follow-up imaging is recommended. Bladder is unremarkable. Stomach/Bowel: Stomach is within normal limits. Appendix appears normal. Mild diverticulosis. No evidence of bowel wall thickening, distention, or inflammatory changes. Vascular/Lymphatic: No significant vascular findings are present. No enlarged abdominal or pelvic lymph nodes. Reproductive: Moderate prostatomegaly status post TURP. Other: Large hernia of the lateral right abdominal wall  containing nondilated lead loops of small and large bowel. No abdominopelvic ascites. No intra-abdominal free air. Musculoskeletal: Numerous lytic osseous lesions seen throughout the thoracic spine and pelvis. Mild compression fracture of L4 with no evidence of retropulsion, unchanged when compared with the prior exam. IMPRESSION: 1. Numerous lytic osseous lesions, consistent with patient's history of multiple myeloma. A large lucent lesion of the T6 vertebral body involves the posterior cortex, extension into the spinal canal can not be excluded. Consider further evaluation with contrast-enhanced MRI of the thoracic spine. 2. Status post right nephrectomy no evidence of recurrent mass in the right nephrectomy bed. 3. No evidence of soft tissue metastatic disease in the chest, abdomen or pelvis. 4. Mild aortic Atherosclerosis (ICD10-I70.0). Electronically Signed   By: Allegra Lai M.D.   On: 05/21/2023 20:22   DG Bone Survey Met  Result Date: 05/21/2023 CLINICAL DATA:  Multiple myeloma EXAM: METASTATIC BONE SURVEY COMPARISON:  None Available. FINDINGS: There are multiple small round lucencies of varying sizes in calvarium. Sella appears largest unusual in size. There are few radiolucent lesions of varying sizes in the shaft of right humerus. Possible small low-density focus is seen in the mid shaft of right clavicle. There are radiolucencies in the distal shaft of left humerus. Small faint lucencies are seen in the proximal shaft of right radius and distal shaft of right ulna. Possible small faint lucency is seen in the distal shaft of left ulna. Degenerative changes are noted with bony spurs in cervical and thoracic spine. There are no focal infiltrates in the lung fields. Deformities in the posterolateral aspects of right fifth and sixth ribs suggest old healed fractures. Possible small faint lucencies are seen in the posterior aspects of left fourth and sixth ribs. Few small lucencies are noted in left  iliac bone and proximal right femur. There is linear lucency in the proximal shaft of right femur. Small faint lucency is seen in the distal shaft of right femur. There are few low-density lesions of varying sizes in the shaft of left femur. IMPRESSION: There are multiple radiolucencies of varying sizes in axial and appendicular skeleton as described in the body of the report  consistent with multiple myeloma lesions. Electronically Signed   By: Ernie Avena M.D.   On: 05/21/2023 19:36   MR Brain W and Wo Contrast  Addendum Date: 05/20/2023   ADDENDUM REPORT: 05/20/2023 15:48 ADDENDUM: Lytic lesions have been identified previously with this patient. 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 Electronically Signed   By: Marin Roberts M.D.   On: 05/20/2023 15:48   Result Date: 05/20/2023 CLINICAL DATA:  Visual is turbinates to the left eye. Abnormal CT scan. EXAM: MRI HEAD WITHOUT AND WITH CONTRAST TECHNIQUE: Multiplanar, multiecho pulse sequences of the brain and surrounding structures were obtained without and with intravenous contrast. CONTRAST:  8mL GADAVIST GADOBUTROL 1 MMOL/ML IV SOLN COMPARISON:  CT of the head and maxillofacial 02/25/2021. FINDINGS: Brain: No acute infarct, hemorrhage, or mass lesion is present. Mild atrophy and white matter changes are likely within normal limits for age. The ventricles are of normal size. No significant extraaxial fluid collection is present. Deep brain nuclei are within normal limits. Enhancing osseous tumor extends to the anterior margin of the right IAC. Internal auditory canals are otherwise within normal limits bilaterally. Inner ear structures are within normal limits. Vascular: Insert normal flow Skull and upper cervical spine: A slightly heterogeneous enhancing mass lesion is again noted at the skull base. This extends along the petrous ridge bilaterally, greater on the right. Tumor surrounds the right internal carotid  artery without definite stenosis. There is some heterogeneity to the tumor on precontrast T2 weighted imaging. The lesion measures 7.8 x 5.6 x 3.3 cm. Multiple additional enhancing lesions are present throughout the skull. Lesion in the anterior right frontal skull measures 11 mm on image 110 of series 17. A lesion in the left parietal skull on image 107 of series 17 measures 11 mm. Multiple other smaller lesions are present as well. Sinuses/Orbits: Minimal mucosal thickening scratched at minimal fluid is present in the right maxillary sinus. The paranasal sinuses are otherwise clear. A right mastoid effusion is present. The tumor impacts the right eustachian tube. Other: IMPRESSION: 1. 7.8 x 5.6 x 3.3 cm enhancing mass lesion at the skull base extending along the petrous ridge bilaterally, greater on the right. 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 There are some intrinsic T2 signal scratched at there is some intrinsic T2 signal and chordoma still considered. However, given the multiple other enhancing lesions throughout the skull, this most likely represents metastatic disease. 2. No acute intracranial abnormality. 3. Mild atrophy and white matter changes are likely within normal limits for age. 4. Right mastoid effusion secondary to obstruction of the eustachian tube. 5. Minimal right maxillary sinus disease. Electronically Signed: By: Marin Roberts M.D. On: 05/20/2023 15:33   MR ORBITS W WO CONTRAST  Result Date: 05/20/2023 CLINICAL DATA:  Visual disturbance to the left eye for 3 weeks. Vision loss, monocular. Skull base mass. EXAM: MRI OF THE ORBITS WITHOUT AND WITH CONTRAST TECHNIQUE: Multiplanar, multi-echo pulse sequences of the orbits and surrounding structures were acquired including fat saturation techniques, before and after intravenous contrast administration. CONTRAST:  8mL GADAVIST GADOBUTROL 1 MMOL/ML IV SOLN COMPARISON:  CT head without contrast 05/20/2023. MR head without  and with contrast 05/20/2023 FINDINGS: Enhancing skull base tumor centered in the clivus measuring 7.8 x 5.3 x 3.7 cm. Tumor extends along the petrous apex, right greater than left. The tumor surrounds the petrous right ICA. Tumor extends into the floor of the sella abuts the cavernous sinus bilaterally. Tumor  invades the posterior ethmoid air cells and sphenoid sinuses bilaterally. Tumor extends to the left orbital apex and likely impacts the left optic nerve. The optic chiasm is within normal limits. Tumor extends into the nasal cavity more prominently on the left. Multiple additional foci of enhancement are present within the skull. Orbits: Bilateral lens replacements are present. The globes are within normal limits. The optic nerve is within normal limits within the canal bilaterally. Extraocular muscles are unremarkable. The superior ophthalmic vein is symmetric. Visualized sinuses: Tumor extends into the posterior ethmoid air cells and sphenoid sinuses as described. A right mastoid effusion is secondary to obstruction. Soft tissues: Extracranial soft tissues are unremarkable. Limited intracranial: Visualized intracranial contents are within normal limits. IMPRESSION: 1. Enhancing skull base tumor centered in the clivus measuring 7.8 x 5.3 x 3.7 cm. Tumor extends along the petrous apex, right greater than left. The tumor surrounds the petrous right ICA. Tumor extends into the floor of the sella abuts the cavernous sinus bilaterally. Tumor invades the posterior ethmoid air cells and sphenoid sinuses bilaterally. Tumor extends to the left orbital apex and likely impacts the left optic nerve. Based on the initial CT, consideration of chordoma was made. Although there is some intrinsic T2 signal and heterogeneous enhancement, the multiple other enhancing lesions suggest this is most likely metastatic disease. Tumor extending into the nasal cavity may be assessable for biopsy. 2. Tumor extends into the nasal cavity  more prominently on the left. 3. Right mastoid effusion is secondary to obstruction. Electronically Signed   By: Marin Roberts M.D.   On: 05/20/2023 15:41   CT Head Wo Contrast  Result Date: 05/20/2023 CLINICAL DATA:  Vision loss. EXAM: CT HEAD WITHOUT CONTRAST TECHNIQUE: Contiguous axial images were obtained from the base of the skull through the vertex without intravenous contrast. RADIATION DOSE REDUCTION: This exam was performed according to the departmental dose-optimization program which includes automated exposure control, adjustment of the mA and/or kV according to patient size and/or use of iterative reconstruction technique. COMPARISON:  02/25/2021. FINDINGS: Brain: Large, hyperattenuating mass enlarges and erodes the clivus, extending through the sella turcica, obliterating the sphenoid sinuses and extending into the posterior ethmoid air cells. Mass diffusely involves the cavernous sinuses, extending more laterally on the left than on the right. The mass broadly contacts the optic nerves from the orbital apices to anterior to the optic chiasm. Mass measures 6.2 cm from right to left, 4 cm from superior to inferior and 5.2 cm anterior to posterior. No other extra-axial masses. No intra-axial masses. No evidence of an infarct and no intracranial hemorrhage. No hydrocephalus. Vascular: No hyperdense vessel or unexpected calcification. Mass broadly involves both cavernous sinuses as detailed above. Skull: In addition to the clivus, mass erodes the petrous portions of both temporal bones as well as the sphenoid bone. Sinuses/Orbits: No abnormality of either globe. No intraorbital abnormality. Sinuses unaffected by the mass are clear. Other: None. IMPRESSION: 1. Large mass at the skull base, eroding and expanding the clivus, also involving the sphenoid bone and the petrous portions of both temporal bones. The mass broadly abuts both optic nerves between the chiasm and orbital apices. Mass also  broadly involves both cavernous sinuses. 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 Recommend further assessment with brain MRI without and with contrast. 2. No acute finding. No evidence of an infarct, hydrocephalus or intracranial hemorrhage. Electronically Signed   By: Onalee Hua  Ormond M.D.   On: 05/20/2023 10:22   DG Chest 2 View  Result Date: 05/06/2023 CLINICAL DATA:  77 year old male with history of right clavicular pain. EXAM: CHEST - 2 VIEW COMPARISON:  Chest x-ray 05/02/2023. FINDINGS: Mildly displaced fracture of the medial right clavicle. Lucency in the region of the medial right clavicle suggesting an underlying bony lesion. Old healed fractures of the lateral right fifth and sixth ribs with abundant callus formation. Lung volumes are normal. No consolidative airspace disease. No pleural effusions. No pneumothorax. No pulmonary nodule or mass noted. Pulmonary vasculature and the cardiomediastinal silhouette are within normal limits. IMPRESSION: 1.  No radiographic evidence of acute cardiopulmonary disease. 2. Probable pathologic fracture of the medial right clavicle better demonstrated on dedicated right clavicular radiographs. 3. Old healed fractures of the lateral right fifth and sixth ribs. Electronically Signed   By: Trudie Reed M.D.   On: 05/06/2023 09:56   DG Clavicle Right  Result Date: 05/06/2023 CLINICAL DATA:  77 year old male with history of pain in the region of the right clavicle. EXAM: RIGHT CLAVICLE - 2+ VIEWS COMPARISON:  No priors. FINDINGS: Multiple lucent lesions in the right clavicle, which could reflect metastatic lesions in this patient with history of renal cell carcinoma, or could be myelomatous lesions. Mildly displaced fracture of the medial right clavicle noted with approximately 8 mm of inferior displacement of the distal aspect of the clavicle. IMPRESSION: 1. Mildly displaced likely  pathologic fracture of the medial right clavicle, as above. Multiple additional lytic lesions in the clavicle could represent myelomatous lesions or metastatic lesions in this patient with history of renal cell carcinoma. Electronically Signed   By: Trudie Reed M.D.   On: 05/06/2023 09:54   DG Shoulder Right  Result Date: 05/06/2023 CLINICAL DATA:  Right shoulder pain. EXAM: RIGHT SHOULDER - 3 VIEW COMPARISON:  None Available. FINDINGS: No acute fracture or dislocation. Degenerative spurring at the acromioclavicular joint. Few subtle lucent areas are seen over the humeral diaphysis (with endosteal scalloping) and at the mid clavicle. Remote fifth and sixth rib fractures with hypertrophic callus. There is known history of multiple myeloma with lesion seen on chest CT from 09/26/2022 IMPRESSION: 1. No acute finding. 2. Known myeloma. Electronically Signed   By: Tiburcio Pea M.D.   On: 05/06/2023 08:41   DG Chest 2 View  Result Date: 05/05/2023 CLINICAL DATA:  chest xray EXAM: CHEST - 2 VIEW COMPARISON:  04/19/2022 FINDINGS: Cardiac silhouette is unremarkable. No pneumothorax or pleural effusion. The lungs are clear. Thoracic degenerative changes noted. Chronic posterior right-sided fifth and sixth rib deformities noted. IMPRESSION: No acute cardiopulmonary process. Electronically Signed   By: Layla Maw M.D.   On: 05/05/2023 22:27    ASSESSMENT & PLAN:    77 year old male with   #1 Progressive IgG kappa myeloma: Initially diagnosed in 2016 as stage II disease with standard risk cytogenetics today. 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 Status post treatment as noted above. Last seen by Dr. Lalla Brothers at [facility] in January 2024 and noted to have progressive disease with increase in his IgG kappa M spike at 2.87 g/dL.   Recent light chains done on 05/20/2023 at [facility] showed increase in free kappa light chains to 603 with a kappa lambda ratio of 25  this is up from kappa light chains of 402 with a kappa lambda ratio of 12.6 on 01/03/2023. CBC shows relatively stable hemoglobin of 11.3 with normal WBC count and platelets  BMP shows stable chronic kidney disease with a creatinine of 1.87.  No overt hypercalcemia with a calcium of 9   #2 recent right clavicular pain with x-ray on 05/06/2023 showing mildly displaced likely pathologic fracture of the medial right clavicle.  Multiple other lytic lesions noted.   #3 current hospitalization with visual difficulty and loss of vision referred to the emergency room by his ophthalmologist. CT head done today 05/20/2023 shows  Large mass at the skull base, eroding and expanding the clivus, also involving the sphenoid bone and the petrous portions of both temporal bones. The mass broadly abuts both optic nerves between the chiasm and orbital apices. Mass also broadly involves both cavernous sinuses. 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 Recommend further assessment with brain MRI without and with contrast.   MRI brain 05/20/2023 1. 7.8 x 5.6 x 3.3 cm enhancing mass lesion at the skull base extending along the petrous ridge bilaterally, greater on the right. 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 There are some intrinsic T2 signal scratched at there is some intrinsic T2 signal and chordoma still considered. However, given the multiple other enhancing lesions throughout the skull, this most likely represents metastatic disease. 2. No acute intracranial abnormality. 3. Mild atrophy and white matter changes are likely within normal limits for age. 4. Right mastoid effusion secondary to obstruction of the eustachian tube. 5. Minimal right maxillary sinus disease.   ADDENDUM: Lytic lesions have been identified previously with this patient. This may represent multiple myeloma with a large skull  base plasmacytoma.   #4 hyponatremia   #5 history of renal cell carcinoma right nephrectomy on 02/03/2016.  He received cryotherapy to his left renal mass in April 2017. Surgically resected by Dr. [name] at [facility] on July 07, 2021. 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 Tumor is limited to the kidney (pT1a).   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 -We went over the CT chest abdomen pelvis and skeletal survey results in detail.  These do not suggest any evidence of metastatic renal cell carcinoma and overall picture is consistent with myeloma progression. -Radiation oncology could consider possible radiation to the T6 lesion and possibly to the right clavicle where the patient has a pathologic fracture causing pain. However, today the pain has improved.  -Plan to set him up to start second line treatment with daratumumab Revlimid dexamethasone as outpatient. -He will also need to be on bisphosphonates or Xgeva for bone directed therapy preferably after dental clearance. -Continue current steroids.  Would discharge on dexamethasone 4 mg p.o. twice daily with further tapering as per radiation oncology. -Medical oncology will set up outpatient follow-up to start daratumumab Revlimid dexamethasone treatment as outpatient and bisphosphonate therapy after dental clearance. -  The total time spent in the appointment was 36 minutes* .  All of the patient's questions were answered with apparent satisfaction. The patient knows to call the clinic with any problems, questions or concerns.   Wyvonnia Lora MD MS AAHIVMS [facility] Hematology/Oncology Physician [facility]  .*Total Encounter Time as defined by the Centers for Medicare and Medicaid Services includes, in addition to  the face-to-face time of a patient visit (documented in  the note above) non-face-to-face time: obtaining and reviewing outside history, ordering and reviewing medications, tests or procedures, care coordination (communications with other health care professionals or caregivers) and documentation in the medical record.   I, Ok Edwards, am acting as a Neurosurgeon for Wyvonnia Lora, MD. .I have reviewed the above documentation for accuracy and completeness, and I agree with the above.  Wyvonnia Lora MD MS Hematology/Oncology Physician [facility]

## 2023-05-23 ENCOUNTER — Other Ambulatory Visit: Payer: Self-pay

## 2023-05-23 ENCOUNTER — Other Ambulatory Visit: Payer: Self-pay | Admitting: Hematology

## 2023-05-23 ENCOUNTER — Ambulatory Visit
Admit: 2023-05-23 | Discharge: 2023-05-23 | Disposition: A | Discharge status: Home / Self Care | Payer: Medicare HMO | Attending: Radiation Oncology | Admitting: Radiation Oncology

## 2023-05-23 DIAGNOSIS — Z510 Encounter for antineoplastic radiation therapy: Secondary | ICD-10-CM | POA: Diagnosis not present

## 2023-05-23 DIAGNOSIS — G9389 Other specified disorders of brain: Secondary | ICD-10-CM | POA: Diagnosis not present

## 2023-05-23 DIAGNOSIS — C9002 Multiple myeloma in relapse: Secondary | ICD-10-CM | POA: Diagnosis not present

## 2023-05-23 DIAGNOSIS — C9000 Multiple myeloma not having achieved remission: Secondary | ICD-10-CM | POA: Diagnosis not present

## 2023-05-23 LAB — RAD ONC ARIA SESSION SUMMARY
Course Elapsed Days: 1
Plan Fractions Treated to Date: 2
Plan Fractions Treated to Date: 2
Plan Name: 615
Plan Prescribed Dose Per Fraction: 3 Gy
Plan Prescribed Dose Per Fraction: 3 Gy
Plan Total Fractions Prescribed: 10
Plan Total Fractions Prescribed: 10
Plan Total Prescribed Dose: 30 Gy
Plan Total Prescribed Dose: 30 Gy
Reference Point Dosage Given to Date: 6 Gy
Reference Point Dosage Given to Date: 6 Gy
Reference Point Session Dosage Given: 3 Gy
Reference Point Session Dosage Given: 3 Gy
Session Number: 2

## 2023-05-23 LAB — BASIC METABOLIC PANEL
Anion gap: 3 — ABNORMAL LOW (ref 5–15)
BUN: 33 mg/dL — ABNORMAL HIGH (ref 8–23)
CO2: 21 mmol/L — ABNORMAL LOW (ref 22–32)
Calcium: 7.9 mg/dL — ABNORMAL LOW (ref 8.9–10.3)
Chloride: 108 mmol/L (ref 98–111)
Creatinine, Ser: 1.49 mg/dL — ABNORMAL HIGH (ref 0.61–1.24)
GFR, Estimated: 48 mL/min — ABNORMAL LOW (ref >60)
Glucose, Bld: 125 mg/dL — ABNORMAL HIGH (ref 70–99)
Potassium: 4.4 mmol/L (ref 3.5–5.1)
Sodium: 132 mmol/L — ABNORMAL LOW (ref 135–145)

## 2023-05-23 LAB — MAGNESIUM: Magnesium: 1.9 mg/dL (ref 1.7–2.4)

## 2023-05-23 MED ORDER — FERROUS SULFATE 325 (65 FE) MG PO TABS: 325.0000 mg | ORAL_TABLET | Freq: Two times a day (BID) | ORAL | Status: AC | PRN

## 2023-05-23 MED ORDER — DOCUSATE SODIUM 100 MG PO CAPS: 100.0000 mg | ORAL_CAPSULE | Freq: Every day | ORAL | Status: AC | PRN

## 2023-05-23 MED ORDER — DEXAMETHASONE 4 MG PO TABS: 4.0000 mg | ORAL_TABLET | Freq: Two times a day (BID) | ORAL | 0 refills | Status: DC

## 2023-05-23 NOTE — Discharge Summary (Signed)
Physician Discharge Summary  Anes Fross ZOX:[id number] DOB: 1946-05-15 DOA: 05/20/2023  PCP: Chilton Greathouse, MD  Admit date: 05/20/2023 Discharge date: 05/23/2023  Admitted From: Home Disposition: Home  Recommendations for Outpatient Follow-up:  Follow up with PCP in 1 week with repeat CBC/BMP Outpatient follow-up with oncology and radiation oncology Outpatient follow-up with palliative care Follow up in ED if symptoms worsen or new appear   Home Health: No Equipment/Devices: None  Discharge Condition: Stable CODE STATUS: Full Diet recommendation: Heart healthy  Brief/Interim Summary: 77 y.o. male with medical history significant of multiple myeloma, renal cell carcinoma status post nephrectomy, managed by [facility], HTN, CKD 3b presented to ophthalmology with concerns of left eye blurry vision.  CT of the head was obtained which showed brain mass.  He was referred to the ED.  On presentation, MRI of brain showed large enhancing mass lesion in the skull base but with other similar lesions, this may represent multiple myeloma with a large skull base plasmacytoma.  EDP spoke to neurosurgery/Dr. Franky Macho who recommended nonoperative management and recommended oncology consultation.  Oncology and radiation oncology were consulted.  He was started on radiation from 05/22/2023 onwards.  Oncology is planning to start chemotherapy as an outpatient.  He will be discharged home today with close outpatient follow-up with oncology and radiation oncology.  Discharge Diagnoses:   Large enhancing mass lesion in the skull base/possible multiple myeloma with a large skull base plasmacytoma Multiple myeloma in relapse -EDP spoke to neurosurgery/Dr. Franky Macho who recommended nonoperative management and recommended oncology consultation.   -He was started on radiation from 05/22/2023 onwards.  Oncology is planning to start chemotherapy as an outpatient.  He will be discharged home today with close outpatient  follow-up with oncology and radiation oncology. -Continue Decadron 4 mg twice a day as per oncology recommendations: Tapering will be done as an outpatient by radiation oncology. -Patient feels okay to go home today.   -Recommend outpatient palliative care evaluation and follow-up for goals of care discussion   Left eye blurred vision -Possibly from above   Pathologic fracture, right clavicle -Diagnosed on 05/06/2023. -Continue pain management if needed. -Possible radiation to this area as well by radiation oncology   Hyponatremia -Possibly in the setting of SIADH due to malignancy -Sodium improving to 132 today.  Treated with gentle hydration.  Outpatient follow-up.  CKD stage IIIb -Creatinine currently stable.  Monitor as an outpatient   Normocytic anemia/anemia of chronic disease -Hemoglobin stable.  Monitor intermittently as an outpatient   History of renal cell carcinoma status post right nephrectomy in 2017, partial left nephrectomy in 06/2021 -Does not appear to have metastatic disease.   Hypertension -Blood pressure currently stable.  Continue Coreg.  Resume Norvasc.  Keep Micardis and Lasix on hold till reevaluation by PCP.  BPH -Continue finasteride and tamsulosin   Physical deconditioning -Tolerated PT eval.  Discharge Instructions  Discharge Instructions     Diet - low sodium heart healthy   Complete by: As directed    Increase activity slowly   Complete by: As directed       Allergies as of 05/23/2023       Reactions   Penicillins Hives   Has patient had a PCN reaction causing immediate rash, facial/tongue/throat swelling, SOB or lightheadedness with hypotension:Yes Has patient had a PCN reaction causing severe rash involving mucus membranes or skin necrosis: Yes Has patient had a PCN reaction that required hospitalization No Has patient had a PCN reaction occurring within the last 10  years: No If all of the above answers are "NO", then may proceed with  Cephalosporin use.   Latex Rash   Tape Rash   adhesive        Medication List     STOP taking these medications    HYDROcodone-acetaminophen 5-325 MG tablet Commonly known as: NORCO/VICODIN   telmisartan 80 MG tablet Commonly known as: MICARDIS       TAKE these medications    amLODipine 10 MG tablet Commonly known as: NORVASC Take 10 mg by mouth daily.   aspirin EC 81 MG tablet Take 81 mg by mouth daily.   carvedilol 6.25 MG tablet Commonly known as: COREG Take 6.25 mg by mouth 2 (two) times daily with a meal.   dexamethasone 4 MG tablet Commonly known as: DECADRON Take 1 tablet (4 mg total) by mouth 2 (two) times daily with a meal.   diclofenac Sodium 1 % Gel Commonly known as: VOLTAREN Apply 2 g topically 4 (four) times daily.   docusate sodium 100 MG capsule Commonly known as: COLACE Take 1 capsule (100 mg total) by mouth daily as needed for mild constipation. What changed:  when to take this reasons to take this   ferrous sulfate 325 (65 FE) MG tablet Take 1 tablet (325 mg total) by mouth 2 (two) times daily as needed (iron).   finasteride 5 MG tablet Commonly known as: PROSCAR Take 5 mg by mouth daily.   fluticasone 50 MCG/ACT nasal spray Commonly known as: FLONASE Place 2 sprays into both nostrils daily as needed for allergies.   furosemide 20 MG tablet Commonly known as: LASIX Take 20 mg by mouth daily.   lactulose 10 GM/15ML solution Commonly known as: CHRONULAC Take 10 g by mouth daily as needed for constipation.   tamsulosin 0.4 MG Caps capsule Commonly known as: FLOMAX Take 1 capsule (0.4 mg total) by mouth daily after supper.        Follow-up Information     Avva, Ravisankar, MD. Schedule an appointment as soon as possible for a visit in 1 week(s).   Specialty: Internal Medicine Contact information: [address] [place] [address] [phone]         [name], MD. Schedule an appointment as soon as  possible for a visit in 1 week(s).   Specialties: Hematology, Oncology Contact information: [address] [place] [address] [phone]                Allergies  Allergen Reactions   Penicillins Hives    Has patient had a PCN reaction causing immediate rash, facial/tongue/throat swelling, SOB or lightheadedness with hypotension:Yes Has patient had a PCN reaction causing severe rash involving mucus membranes or skin necrosis: Yes Has patient had a PCN reaction that required hospitalization No Has patient had a PCN reaction occurring within the last 10 years: No If all of the above answers are "NO", then may proceed with Cephalosporin use.    Latex Rash   Tape Rash    adhesive    Consultations: Oncology/radiation oncology   Procedures/Studies: CT CHEST ABDOMEN PELVIS WO CONTRAST  Result Date: 05/21/2023 CLINICAL DATA:  Renal cell carcinoma and myeloma; * Tracking Code: BO * EXAM: CT CHEST, ABDOMEN AND PELVIS WITHOUT CONTRAST TECHNIQUE: Multidetector CT imaging of the chest, abdomen and pelvis was performed following the standard protocol without IV contrast. RADIATION DOSE REDUCTION: This exam was performed according to the departmental dose-optimization program which includes automated exposure control, adjustment of the mA  and/or kV according to patient size and/or use of iterative reconstruction technique. COMPARISON:  CT abdomen and pelvis dated May 05, 2023 FINDINGS: CT CHEST FINDINGS Cardiovascular: Normal heart size. No pericardial effusion. Normal caliber thoracic aorta with mild atherosclerotic disease. Mediastinum/Nodes: Esophagus and thyroid are unremarkable. No enlarged lymph nodes seen in the chest. Lungs/Pleura: Central airways are patent. Bibasilar atelectasis. No consolidation, pleural effusion or pneumothorax. Musculoskeletal: Numerous scattered lucent lesions seen involving the ribs, sternum, thoracic spine and bilateral clavicles. A large lucent  lesion of the T6 vertebral body involvement of the posterior cortex, extension into the spinal canal can not be excluded. CT ABDOMEN PELVIS FINDINGS Hepatobiliary: Scattered small low-attenuation liver lesions which are too small to accurately characterize. Hypoattenuating lesions of the central liver located on series 2, image 51 and hepatic dome on image 46, previously characterized as benign hemangiomas. Gallbladder is unremarkable. Pancreas: Unremarkable. No pancreatic ductal dilatation or surrounding inflammatory changes. Spleen: Normal in size without focal abnormality. Adrenals/Urinary Tract: No adrenal nodules. Prior right nephrectomy. Left adrenal gland is unremarkable. No left hydronephrosis. Status post partial left nephrectomy. Simple appearing left renal cyst, no specific follow-up imaging is recommended. Bladder is unremarkable. Stomach/Bowel: Stomach is within normal limits. Appendix appears normal. Mild diverticulosis. No evidence of bowel wall thickening, distention, or inflammatory changes. Vascular/Lymphatic: No significant vascular findings are present. No enlarged abdominal or pelvic lymph nodes. Reproductive: Moderate prostatomegaly status post TURP. Other: Large hernia of the lateral right abdominal wall containing nondilated lead loops of small and large bowel. No abdominopelvic ascites. No intra-abdominal free air. Musculoskeletal: Numerous lytic osseous lesions seen throughout the thoracic spine and pelvis. Mild compression fracture of L4 with no evidence of retropulsion, unchanged when compared with the prior exam. IMPRESSION: 1. Numerous lytic osseous lesions, consistent with patient's history of multiple myeloma. A large lucent lesion of the T6 vertebral body involves the posterior cortex, extension into the spinal canal can not be excluded. Consider further evaluation with contrast-enhanced MRI of the thoracic spine. 2. Status post right nephrectomy no evidence of recurrent mass in the  right nephrectomy bed. 3. No evidence of soft tissue metastatic disease in the chest, abdomen or pelvis. 4. Mild aortic Atherosclerosis (ICD10-I70.0). Electronically Signed   By: Allegra Lai M.D.   On: 05/21/2023 20:22   DG Bone Survey Met  Result Date: 05/21/2023 CLINICAL DATA:  Multiple myeloma EXAM: METASTATIC BONE SURVEY COMPARISON:  None Available. FINDINGS: There are multiple small round lucencies of varying sizes in calvarium. Sella appears largest unusual in size. There are few radiolucent lesions of varying sizes in the shaft of right humerus. Possible small low-density focus is seen in the mid shaft of right clavicle. There are radiolucencies in the distal shaft of left humerus. Small faint lucencies are seen in the proximal shaft of right radius and distal shaft of right ulna. Possible small faint lucency is seen in the distal shaft of left ulna. Degenerative changes are noted with bony spurs in cervical and thoracic spine. There are no focal infiltrates in the lung fields. Deformities in the posterolateral aspects of right fifth and sixth ribs suggest old healed fractures. Possible small faint lucencies are seen in the posterior aspects of left fourth and sixth ribs. Few small lucencies are noted in left iliac bone and proximal right femur. There is linear lucency in the proximal shaft of right femur. Small faint lucency is seen in the distal shaft of right femur. There are few low-density lesions of varying sizes in the shaft of  left femur. IMPRESSION: There are multiple radiolucencies of varying sizes in axial and appendicular skeleton as described in the body of the report consistent with multiple myeloma lesions. Electronically Signed   By: Ernie Avena M.D.   On: 05/21/2023 19:36   MR Brain W and Wo Contrast  Addendum Date: 05/20/2023   ADDENDUM REPORT: 05/20/2023 15:48 ADDENDUM: Lytic lesions have been identified previously with this patient. This may represent multiple myeloma  with a large skull base plasmacytoma. Electronically Signed   By: Marin Roberts M.D.   On: 05/20/2023 15:48   Result Date: 05/20/2023 CLINICAL DATA:  Visual is turbinates to the left eye. Abnormal CT scan. EXAM: MRI HEAD WITHOUT AND WITH CONTRAST TECHNIQUE: Multiplanar, multiecho pulse sequences of the brain and surrounding structures were obtained without and with intravenous contrast. CONTRAST:  8mL GADAVIST GADOBUTROL 1 MMOL/ML IV SOLN COMPARISON:  CT of the head and maxillofacial 02/25/2021. FINDINGS: Brain: No acute infarct, hemorrhage, or mass lesion is present. Mild atrophy and white matter changes are likely within normal limits for age. The ventricles are of normal size. No significant extraaxial fluid collection is present. Deep brain nuclei are within normal limits. Enhancing osseous tumor extends to the anterior margin of the right IAC. Internal auditory canals are otherwise within normal limits bilaterally. Inner ear structures are within normal limits. Vascular: Insert normal flow Skull and upper cervical spine: A slightly heterogeneous enhancing mass lesion is again noted at the skull base. This extends along the petrous ridge bilaterally, greater on the right. Tumor surrounds the right internal carotid artery without definite stenosis. There is some heterogeneity to the tumor on precontrast T2 weighted imaging. The lesion measures 7.8 x 5.6 x 3.3 cm. Multiple additional enhancing lesions are present throughout the skull. Lesion in the anterior right frontal skull measures 11 mm on image 110 of series 17. A lesion in the left parietal skull on image 107 of series 17 measures 11 mm. Multiple other smaller lesions are present as well. Sinuses/Orbits: Minimal mucosal thickening scratched at minimal fluid is present in the right maxillary sinus. The paranasal sinuses are otherwise clear. A right mastoid effusion is present. The tumor impacts the right eustachian tube. Other: IMPRESSION: 1. 7.8 x  5.6 x 3.3 cm enhancing mass lesion at the skull base extending along the petrous ridge bilaterally, greater on the right. 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 There are some intrinsic T2 signal scratched at there is some intrinsic T2 signal and chordoma still considered. However, given the multiple other enhancing lesions throughout the skull, this most likely represents metastatic disease. 2. No acute intracranial abnormality. 3. Mild atrophy and white matter changes are likely within normal limits for age. 4. Right mastoid effusion secondary to obstruction of the eustachian tube. 5. Minimal right maxillary sinus disease. Electronically Signed: By: Marin Roberts M.D. On: 05/20/2023 15:33   MR ORBITS W WO CONTRAST  Result Date: 05/20/2023 CLINICAL DATA:  Visual disturbance to the left eye for 3 weeks. Vision loss, monocular. Skull base mass. EXAM: MRI OF THE ORBITS WITHOUT AND WITH CONTRAST TECHNIQUE: Multiplanar, multi-echo pulse sequences of the orbits and surrounding structures were acquired including fat saturation techniques, before and after intravenous contrast administration. CONTRAST:  8mL GADAVIST GADOBUTROL 1 MMOL/ML IV SOLN COMPARISON:  CT head without contrast 05/20/2023. MR head without and with contrast 05/20/2023 FINDINGS: Enhancing skull base tumor centered in the clivus measuring 7.8 x 5.3 x 3.7 cm. Tumor extends along the petrous apex, right greater  than left. The tumor surrounds the petrous right ICA. Tumor extends into the floor of the sella abuts the cavernous sinus bilaterally. Tumor invades the posterior ethmoid air cells and sphenoid sinuses bilaterally. Tumor extends to the left orbital apex and likely impacts the left optic nerve. The optic chiasm is within normal limits. Tumor extends into the nasal cavity more prominently on the left. Multiple additional foci of enhancement are present within the skull. Orbits: Bilateral lens replacements are present. The globes  are within normal limits. The optic nerve is within normal limits within the canal bilaterally. Extraocular muscles are unremarkable. The superior ophthalmic vein is symmetric. Visualized sinuses: Tumor extends into the posterior ethmoid air cells and sphenoid sinuses as described. A right mastoid effusion is secondary to obstruction. Soft tissues: Extracranial soft tissues are unremarkable. Limited intracranial: Visualized intracranial contents are within normal limits. IMPRESSION: 1. Enhancing skull base tumor centered in the clivus measuring 7.8 x 5.3 x 3.7 cm. Tumor extends along the petrous apex, right greater than left. The tumor surrounds the petrous right ICA. Tumor extends into the floor of the sella abuts the cavernous sinus bilaterally. Tumor invades the posterior ethmoid air cells and sphenoid sinuses bilaterally. Tumor extends to the left orbital apex and likely impacts the left optic nerve. Based on the initial CT, consideration of chordoma was made. Although there is some intrinsic T2 signal and heterogeneous enhancement, the multiple other enhancing lesions suggest this is most likely metastatic disease. Tumor extending into the nasal cavity may be assessable for biopsy. 2. Tumor extends into the nasal cavity more prominently on the left. 3. Right mastoid effusion is secondary to obstruction. Electronically Signed   By: Marin Roberts M.D.   On: 05/20/2023 15:41   CT Head Wo Contrast  Result Date: 05/20/2023 CLINICAL DATA:  Vision loss. EXAM: CT HEAD WITHOUT CONTRAST TECHNIQUE: Contiguous axial images were obtained from the base of the skull through the vertex without intravenous contrast. RADIATION DOSE REDUCTION: This exam was performed according to the departmental dose-optimization program which includes automated exposure control, adjustment of the mA and/or kV according to patient size and/or use of iterative reconstruction technique. COMPARISON:  02/25/2021. FINDINGS: Brain: Large,  hyperattenuating mass enlarges and erodes the clivus, extending through the sella turcica, obliterating the sphenoid sinuses and extending into the posterior ethmoid air cells. Mass diffusely involves the cavernous sinuses, extending more laterally on the left than on the right. The mass broadly contacts the optic nerves from the orbital apices to anterior to the optic chiasm. Mass measures 6.2 cm from right to left, 4 cm from superior to inferior and 5.2 cm anterior to posterior. No other extra-axial masses. No intra-axial masses. No evidence of an infarct and no intracranial hemorrhage. No hydrocephalus. Vascular: No hyperdense vessel or unexpected calcification. Mass broadly involves both cavernous sinuses as detailed above. Skull: In addition to the clivus, mass erodes the petrous portions of both temporal bones as well as the sphenoid bone. Sinuses/Orbits: No abnormality of either globe. No intraorbital abnormality. Sinuses unaffected by the mass are clear. Other: None. IMPRESSION: 1. Large mass at the skull base, eroding and expanding the clivus, also involving the sphenoid bone and the petrous portions of both temporal bones. The mass broadly abuts both optic nerves between the chiasm and orbital apices. Mass also broadly involves both cavernous sinuses. 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 Recommend further assessment with brain MRI  without and with contrast. 2. No acute finding. No evidence of an infarct, hydrocephalus or intracranial hemorrhage. Electronically Signed   By: Amie Portland M.D.   On: 05/20/2023 10:22   DG Chest 2 View  Result Date: 05/06/2023 CLINICAL DATA:  78 year old male with history of right clavicular pain. EXAM: CHEST - 2 VIEW COMPARISON:  Chest x-ray 05/02/2023. FINDINGS: Mildly displaced fracture of the medial right clavicle. Lucency in the region of the medial right clavicle suggesting an  underlying bony lesion. Old healed fractures of the lateral right fifth and sixth ribs with abundant callus formation. Lung volumes are normal. No consolidative airspace disease. No pleural effusions. No pneumothorax. No pulmonary nodule or mass noted. Pulmonary vasculature and the cardiomediastinal silhouette are within normal limits. IMPRESSION: 1.  No radiographic evidence of acute cardiopulmonary disease. 2. Probable pathologic fracture of the medial right clavicle better demonstrated on dedicated right clavicular radiographs. 3. Old healed fractures of the lateral right fifth and sixth ribs. Electronically Signed   By: Trudie Reed M.D.   On: 05/06/2023 09:56   DG Clavicle Right  Result Date: 05/06/2023 CLINICAL DATA:  77 year old male with history of pain in the region of the right clavicle. EXAM: RIGHT CLAVICLE - 2+ VIEWS COMPARISON:  No priors. FINDINGS: Multiple lucent lesions in the right clavicle, which could reflect metastatic lesions in this patient with history of renal cell carcinoma, or could be myelomatous lesions. Mildly displaced fracture of the medial right clavicle noted with approximately 8 mm of inferior displacement of the distal aspect of the clavicle. IMPRESSION: 1. Mildly displaced likely pathologic fracture of the medial right clavicle, as above. Multiple additional lytic lesions in the clavicle could represent myelomatous lesions or metastatic lesions in this patient with history of renal cell carcinoma. Electronically Signed   By: Trudie Reed M.D.   On: 05/06/2023 09:54   DG Shoulder Right  Result Date: 05/06/2023 CLINICAL DATA:  Right shoulder pain. EXAM: RIGHT SHOULDER - 3 VIEW COMPARISON:  None Available. FINDINGS: No acute fracture or dislocation. Degenerative spurring at the acromioclavicular joint. Few subtle lucent areas are seen over the humeral diaphysis (with endosteal scalloping) and at the mid clavicle. Remote fifth and sixth rib fractures with hypertrophic  callus. There is known history of multiple myeloma with lesion seen on chest CT from 09/26/2022 IMPRESSION: 1. No acute finding. 2. Known myeloma. Electronically Signed   By: Tiburcio Pea M.D.   On: 05/06/2023 08:41   DG Chest 2 View  Result Date: 05/05/2023 CLINICAL DATA:  chest xray EXAM: CHEST - 2 VIEW COMPARISON:  04/19/2022 FINDINGS: Cardiac silhouette is unremarkable. No pneumothorax or pleural effusion. The lungs are clear. Thoracic degenerative changes noted. Chronic posterior right-sided fifth and sixth rib deformities noted. IMPRESSION: No acute cardiopulmonary process. Electronically Signed   By: Layla Maw M.D.   On: 05/05/2023 22:27      Subjective: Patient seen and examined at bedside.  Feels okay [place] today.  Denies any overnight fever, vomiting, chest pain or shortness of breath.  Discharge Exam: Vitals:   05/22/23 2058 05/23/23 0508  BP: 125/77 137/81  Pulse: 73 (!) 56  Resp: 18 18  Temp: 98.3 F (36.8 C) 97.8 F (36.6 C)  SpO2: 100% 100%    General: Pt is alert, awake, not in acute distress.  Currently on room air Cardiovascular: Mild intermittent bradycardia present; S1/S2 + Respiratory: bilateral decreased breath sounds at bases Abdominal: Soft, NT, ND, bowel sounds + Extremities: Trace lower 70 edema; no cyanosis  The results of significant diagnostics from this hospitalization (including imaging, microbiology, ancillary and laboratory) are listed below for reference.     Microbiology: No results found for this or any previous visit (from the past 240 hour(s)).   Labs: BNP (last 3 results) No results for input(s): "BNP" in the last 8760 hours. Basic Metabolic Panel: Recent Labs  Lab 05/20/23 1148 05/21/23 0533 05/22/23 0544 05/23/23 0541  NA 129* 126* 128* 132*  K 4.6 4.5 4.4 4.4  CL 98 102 104 108  CO2 24 20* 20* 21*  GLUCOSE 96 144* 129* 125*  BUN 26* 30* 32* 33*  CREATININE 1.87* 1.66* 1.50* 1.49*  CALCIUM 9.0 8.3* 7.8* 7.9*   MG  --   --  2.1 1.9   Liver Function Tests: Recent Labs  Lab 05/21/23 0533  AST 15  ALT 17  ALKPHOS 44  BILITOT 0.4  PROT 9.8*  ALBUMIN 2.9*   No results for input(s): "LIPASE", "AMYLASE" in the last 168 hours. No results for input(s): "AMMONIA" in the last 168 hours. CBC: Recent Labs  Lab 05/20/23 1148 05/21/23 0533  WBC 5.3 5.2  NEUTROABS  --  3.8  HGB 11.3* 9.9*  HCT 34.6* 30.3*  MCV 89.2 89.6  PLT 266 242   Cardiac Enzymes: No results for input(s): "CKTOTAL", "CKMB", "CKMBINDEX", "TROPONINI" in the last 168 hours. BNP: Invalid input(s): "POCBNP" CBG: No results for input(s): "GLUCAP" in the last 168 hours. D-Dimer No results for input(s): "DDIMER" in the last 72 hours. Hgb A1c No results for input(s): "HGBA1C" in the last 72 hours. Lipid Profile No results for input(s): "CHOL", "HDL", "LDLCALC", "TRIG", "CHOLHDL", "LDLDIRECT" in the last 72 hours. Thyroid function studies No results for input(s): "TSH", "T4TOTAL", "T3FREE", "THYROIDAB" in the last 72 hours.  Invalid input(s): "FREET3" Anemia work up No results for input(s): "VITAMINB12", "FOLATE", "FERRITIN", "TIBC", "IRON", "RETICCTPCT" in the last 72 hours. Urinalysis    Component Value Date/Time   COLORURINE RED (A) 04/17/2021 1447   APPEARANCEUR TURBID (A) 04/17/2021 1447   LABSPEC  04/17/2021 1447    TEST NOT REPORTED DUE TO COLOR INTERFERENCE OF URINE PIGMENT   PHURINE  04/17/2021 1447    TEST NOT REPORTED DUE TO COLOR INTERFERENCE OF URINE PIGMENT   GLUCOSEU (A) 04/17/2021 1447    TEST NOT REPORTED DUE TO COLOR INTERFERENCE OF URINE PIGMENT   HGBUR (A) 04/17/2021 1447    TEST NOT REPORTED DUE TO COLOR INTERFERENCE OF URINE PIGMENT   BILIRUBINUR (A) 04/17/2021 1447    TEST NOT REPORTED DUE TO COLOR INTERFERENCE OF URINE PIGMENT   KETONESUR (A) 04/17/2021 1447    TEST NOT REPORTED DUE TO COLOR INTERFERENCE OF URINE PIGMENT   PROTEINUR (A) 04/17/2021 1447    TEST NOT REPORTED DUE TO COLOR  INTERFERENCE OF URINE PIGMENT   UROBILINOGEN >=8.0 04/16/2021 0932   NITRITE (A) 04/17/2021 1447    TEST NOT REPORTED DUE TO COLOR INTERFERENCE OF URINE PIGMENT   LEUKOCYTESUR (A) 04/17/2021 1447    TEST NOT REPORTED DUE TO COLOR INTERFERENCE OF URINE PIGMENT   Sepsis Labs Recent Labs  Lab 05/20/23 1148 05/21/23 0533  WBC 5.3 5.2   Microbiology No results found for this or any previous visit (from the past 240 hour(s)).   Time coordinating discharge: 35 minutes  SIGNED:   [name], MD  [facility] 05/23/2023, 9:18 AM

## 2023-05-24 ENCOUNTER — Ambulatory Visit
Admission: RE | Admit: 2023-05-24 | Discharge: 2023-05-24 | Disposition: A | Discharge status: Home / Self Care | Payer: Medicare HMO | Source: Ambulatory Visit | Attending: Radiation Oncology | Admitting: Radiation Oncology

## 2023-05-24 ENCOUNTER — Other Ambulatory Visit: Payer: Self-pay

## 2023-05-24 ENCOUNTER — Other Ambulatory Visit: Payer: Self-pay | Admitting: Hematology

## 2023-05-24 ENCOUNTER — Encounter: Payer: Self-pay | Admitting: Hematology

## 2023-05-24 DIAGNOSIS — C9000 Multiple myeloma not having achieved remission: Secondary | ICD-10-CM | POA: Diagnosis not present

## 2023-05-24 DIAGNOSIS — Z7189 Other specified counseling: Secondary | ICD-10-CM

## 2023-05-24 DIAGNOSIS — C9002 Multiple myeloma in relapse: Secondary | ICD-10-CM

## 2023-05-24 DIAGNOSIS — Z510 Encounter for antineoplastic radiation therapy: Secondary | ICD-10-CM | POA: Diagnosis not present

## 2023-05-24 LAB — RAD ONC ARIA SESSION SUMMARY
Course Elapsed Days: 2
Plan Fractions Treated to Date: 3
Plan Fractions Treated to Date: 3
Plan Name: 615
Plan Prescribed Dose Per Fraction: 3 Gy
Plan Prescribed Dose Per Fraction: 3 Gy
Plan Total Fractions Prescribed: 10
Plan Total Fractions Prescribed: 10
Plan Total Prescribed Dose: 30 Gy
Plan Total Prescribed Dose: 30 Gy
Reference Point Dosage Given to Date: 9 Gy
Reference Point Dosage Given to Date: 9 Gy
Reference Point Session Dosage Given: 3 Gy
Reference Point Session Dosage Given: 3 Gy
Session Number: 3

## 2023-05-24 MED ORDER — DEXAMETHASONE 4 MG PO TABS
20.0000 mg | ORAL_TABLET | ORAL | 11 refills | Status: DC
Start: 2023-05-24 — End: 2023-06-19

## 2023-05-24 MED ORDER — PROCHLORPERAZINE MALEATE 10 MG PO TABS
10.0000 mg | ORAL_TABLET | Freq: Four times a day (QID) | ORAL | 1 refills | Status: AC | PRN
Start: 2023-05-24 — End: ?

## 2023-05-24 MED ORDER — ONDANSETRON HCL 8 MG PO TABS
8.0000 mg | ORAL_TABLET | Freq: Three times a day (TID) | ORAL | 1 refills | Status: AC | PRN
Start: 2023-05-24 — End: ?

## 2023-05-24 MED ORDER — LENALIDOMIDE 10 MG PO CAPS
ORAL_CAPSULE | ORAL | 0 refills | Status: DC
Start: 2023-05-24 — End: 2023-06-02

## 2023-05-24 MED ORDER — ACYCLOVIR 400 MG PO TABS
400.0000 mg | ORAL_TABLET | Freq: Two times a day (BID) | ORAL | 11 refills | Status: DC
Start: 2023-05-24 — End: 2024-02-29

## 2023-05-24 NOTE — Progress Notes (Signed)
START ON PATHWAY REGIMEN - Multiple Myeloma and Other Plasma Cell Dyscrasias     Cycles 1 and 2: A cycle is every 28 days:     Lenalidomide      Dexamethasone      Daratumumab and hyaluronidase-fihj    Cycles 3 through 6: A cycle is every 28 days:     Lenalidomide      Dexamethasone      Dexamethasone      Daratumumab and hyaluronidase-fihj    Cycles 7 and beyond: A cycle is every 28 days:     Lenalidomide      Dexamethasone      Dexamethasone      Daratumumab and hyaluronidase-fihj   **Always confirm dose/schedule in your pharmacy ordering system**  Patient Characteristics: Multiple Myeloma, Relapsed / Refractory, Second through Fourth Lines of Therapy, Fit or Candidate for Triplet Therapy, Not Lenalidomide-Refractory and Lenalidomide-based Regimen Preferred, Candidate for Anti-CD38 Antibody Disease Classification: Multiple Myeloma Therapeutic Status: Relapsed R2-ISS Staging: II Line of Therapy: Third Line Anti-CD38 Antibody Candidacy: Candidate for Anti-CD38 Antibody Lenalidomide-based Regimen Preference/Candidacy: Not Lenalidomide-Refractory and Lenalidomide-based Regimen Preferred Intent of Therapy: Non-Curative / Palliative Intent, Discussed with Patient

## 2023-05-25 ENCOUNTER — Ambulatory Visit
Admission: RE | Admit: 2023-05-25 | Discharge: 2023-05-25 | Disposition: A | Discharge status: Home / Self Care | Payer: Medicare HMO | Source: Ambulatory Visit | Attending: Radiation Oncology | Admitting: Radiation Oncology

## 2023-05-25 ENCOUNTER — Other Ambulatory Visit (HOSPITAL_COMMUNITY): Payer: Self-pay

## 2023-05-25 ENCOUNTER — Telehealth: Payer: Self-pay | Admitting: Pharmacy Technician

## 2023-05-25 ENCOUNTER — Telehealth: Payer: Self-pay | Admitting: Pharmacist

## 2023-05-25 ENCOUNTER — Other Ambulatory Visit: Payer: Self-pay

## 2023-05-25 ENCOUNTER — Telehealth: Payer: Self-pay | Admitting: Hematology

## 2023-05-25 DIAGNOSIS — C9000 Multiple myeloma not having achieved remission: Secondary | ICD-10-CM | POA: Diagnosis not present

## 2023-05-25 DIAGNOSIS — C9002 Multiple myeloma in relapse: Secondary | ICD-10-CM

## 2023-05-25 DIAGNOSIS — Z510 Encounter for antineoplastic radiation therapy: Secondary | ICD-10-CM | POA: Diagnosis not present

## 2023-05-25 DIAGNOSIS — Z7189 Other specified counseling: Secondary | ICD-10-CM

## 2023-05-25 LAB — RAD ONC ARIA SESSION SUMMARY
Course Elapsed Days: 3
Plan Fractions Treated to Date: 4
Plan Fractions Treated to Date: 4
Plan Name: 615
Plan Prescribed Dose Per Fraction: 3 Gy
Plan Prescribed Dose Per Fraction: 3 Gy
Plan Total Fractions Prescribed: 10
Plan Total Fractions Prescribed: 10
Plan Total Prescribed Dose: 30 Gy
Plan Total Prescribed Dose: 30 Gy
Reference Point Dosage Given to Date: 12 Gy
Reference Point Dosage Given to Date: 12 Gy
Reference Point Session Dosage Given: 3 Gy
Reference Point Session Dosage Given: 3 Gy
Session Number: 4

## 2023-05-25 NOTE — Telephone Encounter (Signed)
Oral Oncology Patient Advocate Encounter  Was successful in securing patient a $12,000 grant from [facility] to provide copayment coverage for lenalidomide.  This will keep the out of pocket expense at $0.     [employer] ID: [id number]   The billing information is as follows and has been shared with [employer].    RxBin: [id number] PCN: [id number] Member ID: [id number] Group ID: [id number] Dates of Eligibility: 04/25/23 through 04/23/24  Fund:  MM  Jinger Neighbors, CPhT-Adv Oncology Pharmacy Patient Advocate [facility] Direct Number: [phone]  Fax: [phone]

## 2023-05-25 NOTE — Telephone Encounter (Signed)
Oral Oncology Patient Advocate Encounter   Received notification that prior authorization for lenalidomie is required.   PA submitted on 05/25/23 Key [id number] Status is pending     Jinger Neighbors, CPhT-Adv Oncology Pharmacy Patient Advocate [facility] Direct Number: [phone]  Fax: [phone]

## 2023-05-25 NOTE — Telephone Encounter (Signed)
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  BMP from 05/23/23 and CBC w/ Diff from 05/21/23 assessed, patient with Scr of 1.49 mg/dL (CrCl ~ 47 mL/min) - Revlimid has been renally dose adjusted. Prescription dose and frequency assessed for appropriateness.  Current medication list in Epic reviewed, no relevant/significant DDIs with Revlimid identified.  Evaluated chart and no patient barriers to medication adherence noted.   Patient agreement for treatment documented in MD note on 05/22/23.  Once [employer] auth # is obtained, prescription will need to be e-scribed to [employer] for dispensing.   Oral Oncology Clinic will continue to follow for insurance authorization, copayment issues, initial counseling and start date.  Lenord Carbo, PharmD, BCPS, BCOP Hematology/Oncology Clinical Pharmacist [facility] Oral Chemotherapy Navigation Clinics [phone] 05/25/2023 10:04 AM

## 2023-05-25 NOTE — Telephone Encounter (Signed)
Oral Oncology Patient Advocate Encounter  Prior Authorization for lenalidomide has been approved.    PA# [id number] Effective dates: 05/25/23 through 12/19/23  Patients co-pay is $4,118.51.    Jinger Neighbors, CPhT-Adv Oncology Pharmacy Patient Advocate [facility] Direct Number: [phone]  Fax: [phone]

## 2023-05-26 ENCOUNTER — Other Ambulatory Visit: Payer: Self-pay

## 2023-05-26 ENCOUNTER — Ambulatory Visit
Admission: RE | Admit: 2023-05-26 | Discharge: 2023-05-26 | Disposition: A | Discharge status: Home / Self Care | Payer: Medicare HMO | Source: Ambulatory Visit | Attending: Radiation Oncology | Admitting: Radiation Oncology

## 2023-05-26 DIAGNOSIS — C9000 Multiple myeloma not having achieved remission: Secondary | ICD-10-CM | POA: Diagnosis not present

## 2023-05-26 DIAGNOSIS — Z510 Encounter for antineoplastic radiation therapy: Secondary | ICD-10-CM | POA: Diagnosis not present

## 2023-05-26 LAB — RAD ONC ARIA SESSION SUMMARY
Course Elapsed Days: 4
Plan Fractions Treated to Date: 5
Plan Fractions Treated to Date: 5
Plan Name: 615
Plan Prescribed Dose Per Fraction: 3 Gy
Plan Prescribed Dose Per Fraction: 3 Gy
Plan Total Fractions Prescribed: 10
Plan Total Fractions Prescribed: 10
Plan Total Prescribed Dose: 30 Gy
Plan Total Prescribed Dose: 30 Gy
Reference Point Dosage Given to Date: 15 Gy
Reference Point Dosage Given to Date: 15 Gy
Reference Point Session Dosage Given: 3 Gy
Reference Point Session Dosage Given: 3 Gy
Session Number: 5

## 2023-05-27 ENCOUNTER — Other Ambulatory Visit: Payer: Self-pay

## 2023-05-29 ENCOUNTER — Other Ambulatory Visit: Payer: Self-pay

## 2023-05-29 ENCOUNTER — Ambulatory Visit
Admission: RE | Admit: 2023-05-29 | Discharge: 2023-05-29 | Disposition: A | Discharge status: Home / Self Care | Payer: Medicare HMO | Source: Ambulatory Visit | Attending: Radiation Oncology | Admitting: Radiation Oncology

## 2023-05-29 DIAGNOSIS — Z510 Encounter for antineoplastic radiation therapy: Secondary | ICD-10-CM | POA: Diagnosis not present

## 2023-05-29 DIAGNOSIS — C9000 Multiple myeloma not having achieved remission: Secondary | ICD-10-CM | POA: Diagnosis not present

## 2023-05-29 LAB — RAD ONC ARIA SESSION SUMMARY
Course Elapsed Days: 7
Plan Fractions Treated to Date: 6
Plan Fractions Treated to Date: 6
Plan Name: 615
Plan Prescribed Dose Per Fraction: 3 Gy
Plan Prescribed Dose Per Fraction: 3 Gy
Plan Total Fractions Prescribed: 10
Plan Total Fractions Prescribed: 10
Plan Total Prescribed Dose: 30 Gy
Plan Total Prescribed Dose: 30 Gy
Reference Point Dosage Given to Date: 18 Gy
Reference Point Dosage Given to Date: 18 Gy
Reference Point Session Dosage Given: 3 Gy
Reference Point Session Dosage Given: 3 Gy
Session Number: 6

## 2023-05-29 LAB — MULTIPLE MYELOMA PANEL, SERUM
Albumin SerPl Elph-Mcnc: 3.7 g/dL (ref 2.9–4.4)
Albumin/Glob SerPl: 0.6 — ABNORMAL LOW (ref 0.7–1.7)
Alpha 1: 0.4 g/dL (ref 0.0–0.4)
Alpha2 Glob SerPl Elph-Mcnc: 1.1 g/dL — ABNORMAL HIGH (ref 0.4–1.0)
B-Globulin SerPl Elph-Mcnc: 1.2 g/dL (ref 0.7–1.3)
Gamma Glob SerPl Elph-Mcnc: 4.1 g/dL — ABNORMAL HIGH (ref 0.4–1.8)
Globulin, Total: 6.8 g/dL — ABNORMAL HIGH (ref 2.2–3.9)
IgA: 97 mg/dL (ref 61–437)
IgG (Immunoglobin G), Serum: 6538 mg/dL — ABNORMAL HIGH (ref 603–1613)
IgM (Immunoglobulin M), Srm: 16 mg/dL (ref 15–143)
M Protein SerPl Elph-Mcnc: 3.8 g/dL — ABNORMAL HIGH
Total Protein ELP: 10.5 g/dL — ABNORMAL HIGH (ref 6.0–8.5)

## 2023-05-30 ENCOUNTER — Other Ambulatory Visit: Payer: Self-pay

## 2023-05-30 ENCOUNTER — Ambulatory Visit
Admission: RE | Admit: 2023-05-30 | Discharge: 2023-05-30 | Disposition: A | Discharge status: Home / Self Care | Payer: Medicare HMO | Source: Ambulatory Visit | Attending: Radiation Oncology | Admitting: Radiation Oncology

## 2023-05-30 DIAGNOSIS — C9000 Multiple myeloma not having achieved remission: Secondary | ICD-10-CM | POA: Diagnosis not present

## 2023-05-30 DIAGNOSIS — Z510 Encounter for antineoplastic radiation therapy: Secondary | ICD-10-CM | POA: Diagnosis not present

## 2023-05-30 LAB — RAD ONC ARIA SESSION SUMMARY
Course Elapsed Days: 8
Plan Fractions Treated to Date: 7
Plan Fractions Treated to Date: 7
Plan Name: 615
Plan Prescribed Dose Per Fraction: 3 Gy
Plan Prescribed Dose Per Fraction: 3 Gy
Plan Total Fractions Prescribed: 10
Plan Total Fractions Prescribed: 10
Plan Total Prescribed Dose: 30 Gy
Plan Total Prescribed Dose: 30 Gy
Reference Point Dosage Given to Date: 21 Gy
Reference Point Dosage Given to Date: 21 Gy
Reference Point Session Dosage Given: 3 Gy
Reference Point Session Dosage Given: 3 Gy
Session Number: 7

## 2023-05-31 ENCOUNTER — Other Ambulatory Visit: Payer: Self-pay

## 2023-05-31 ENCOUNTER — Ambulatory Visit
Admission: RE | Admit: 2023-05-31 | Discharge: 2023-05-31 | Disposition: A | Discharge status: Home / Self Care | Payer: Medicare HMO | Source: Ambulatory Visit | Attending: Radiation Oncology | Admitting: Radiation Oncology

## 2023-05-31 DIAGNOSIS — Z510 Encounter for antineoplastic radiation therapy: Secondary | ICD-10-CM | POA: Diagnosis not present

## 2023-05-31 DIAGNOSIS — C9000 Multiple myeloma not having achieved remission: Secondary | ICD-10-CM | POA: Diagnosis not present

## 2023-05-31 LAB — RAD ONC ARIA SESSION SUMMARY
Course Elapsed Days: 9
Plan Fractions Treated to Date: 8
Plan Fractions Treated to Date: 8
Plan Name: 615
Plan Prescribed Dose Per Fraction: 3 Gy
Plan Prescribed Dose Per Fraction: 3 Gy
Plan Total Fractions Prescribed: 10
Plan Total Fractions Prescribed: 10
Plan Total Prescribed Dose: 30 Gy
Plan Total Prescribed Dose: 30 Gy
Reference Point Dosage Given to Date: 24 Gy
Reference Point Dosage Given to Date: 24 Gy
Reference Point Session Dosage Given: 3 Gy
Reference Point Session Dosage Given: 3 Gy
Session Number: 8

## 2023-06-01 ENCOUNTER — Other Ambulatory Visit: Payer: Self-pay

## 2023-06-01 ENCOUNTER — Ambulatory Visit
Admission: RE | Admit: 2023-06-01 | Discharge: 2023-06-01 | Disposition: A | Discharge status: Home / Self Care | Payer: Medicare HMO | Source: Ambulatory Visit | Attending: Radiation Oncology | Admitting: Radiation Oncology

## 2023-06-01 DIAGNOSIS — C9000 Multiple myeloma not having achieved remission: Secondary | ICD-10-CM | POA: Diagnosis not present

## 2023-06-01 DIAGNOSIS — Z510 Encounter for antineoplastic radiation therapy: Secondary | ICD-10-CM | POA: Diagnosis not present

## 2023-06-01 LAB — RAD ONC ARIA SESSION SUMMARY
Course Elapsed Days: 10
Plan Fractions Treated to Date: 9
Plan Fractions Treated to Date: 9
Plan Name: 615
Plan Prescribed Dose Per Fraction: 3 Gy
Plan Prescribed Dose Per Fraction: 3 Gy
Plan Total Fractions Prescribed: 10
Plan Total Fractions Prescribed: 10
Plan Total Prescribed Dose: 30 Gy
Plan Total Prescribed Dose: 30 Gy
Reference Point Dosage Given to Date: 27 Gy
Reference Point Dosage Given to Date: 27 Gy
Reference Point Session Dosage Given: 3 Gy
Reference Point Session Dosage Given: 3 Gy
Session Number: 9

## 2023-06-02 ENCOUNTER — Ambulatory Visit
Admission: RE | Admit: 2023-06-02 | Discharge: 2023-06-02 | Disposition: A | Discharge status: Home / Self Care | Payer: Medicare HMO | Source: Ambulatory Visit | Attending: Radiation Oncology | Admitting: Radiation Oncology

## 2023-06-02 ENCOUNTER — Inpatient Hospital Stay: Payer: Medicare HMO | Attending: Hematology | Admitting: Hematology

## 2023-06-02 ENCOUNTER — Other Ambulatory Visit: Payer: Self-pay

## 2023-06-02 ENCOUNTER — Encounter: Payer: Self-pay | Admitting: Radiation Oncology

## 2023-06-02 ENCOUNTER — Inpatient Hospital Stay: Payer: Medicare HMO

## 2023-06-02 VITALS — BP 114/74 | HR 60 | Temp 98.0°F | Resp 17 | Ht 69.0 in | Wt 178.0 lb

## 2023-06-02 DIAGNOSIS — Z79899 Other long term (current) drug therapy: Secondary | ICD-10-CM | POA: Insufficient documentation

## 2023-06-02 DIAGNOSIS — I129 Hypertensive chronic kidney disease with stage 1 through stage 4 chronic kidney disease, or unspecified chronic kidney disease: Secondary | ICD-10-CM | POA: Insufficient documentation

## 2023-06-02 DIAGNOSIS — Z7982 Long term (current) use of aspirin: Secondary | ICD-10-CM | POA: Diagnosis not present

## 2023-06-02 DIAGNOSIS — Z7189 Other specified counseling: Secondary | ICD-10-CM

## 2023-06-02 DIAGNOSIS — Z5111 Encounter for antineoplastic chemotherapy: Secondary | ICD-10-CM | POA: Insufficient documentation

## 2023-06-02 DIAGNOSIS — C9002 Multiple myeloma in relapse: Secondary | ICD-10-CM

## 2023-06-02 DIAGNOSIS — Z905 Acquired absence of kidney: Secondary | ICD-10-CM | POA: Insufficient documentation

## 2023-06-02 DIAGNOSIS — K769 Liver disease, unspecified: Secondary | ICD-10-CM | POA: Diagnosis not present

## 2023-06-02 DIAGNOSIS — Z510 Encounter for antineoplastic radiation therapy: Secondary | ICD-10-CM | POA: Diagnosis not present

## 2023-06-02 DIAGNOSIS — Z85528 Personal history of other malignant neoplasm of kidney: Secondary | ICD-10-CM | POA: Diagnosis not present

## 2023-06-02 DIAGNOSIS — G4700 Insomnia, unspecified: Secondary | ICD-10-CM | POA: Insufficient documentation

## 2023-06-02 DIAGNOSIS — N400 Enlarged prostate without lower urinary tract symptoms: Secondary | ICD-10-CM | POA: Insufficient documentation

## 2023-06-02 DIAGNOSIS — Z791 Long term (current) use of non-steroidal anti-inflammatories (NSAID): Secondary | ICD-10-CM | POA: Insufficient documentation

## 2023-06-02 DIAGNOSIS — Z79624 Long term (current) use of inhibitors of nucleotide synthesis: Secondary | ICD-10-CM | POA: Diagnosis not present

## 2023-06-02 DIAGNOSIS — N183 Chronic kidney disease, stage 3 unspecified: Secondary | ICD-10-CM | POA: Diagnosis not present

## 2023-06-02 DIAGNOSIS — M4856XA Collapsed vertebra, not elsewhere classified, lumbar region, initial encounter for fracture: Secondary | ICD-10-CM | POA: Insufficient documentation

## 2023-06-02 DIAGNOSIS — E871 Hypo-osmolality and hyponatremia: Secondary | ICD-10-CM | POA: Insufficient documentation

## 2023-06-02 DIAGNOSIS — R220 Localized swelling, mass and lump, head: Secondary | ICD-10-CM | POA: Diagnosis not present

## 2023-06-02 DIAGNOSIS — Z9079 Acquired absence of other genital organ(s): Secondary | ICD-10-CM | POA: Diagnosis not present

## 2023-06-02 DIAGNOSIS — C9000 Multiple myeloma not having achieved remission: Secondary | ICD-10-CM | POA: Insufficient documentation

## 2023-06-02 DIAGNOSIS — Z7952 Long term (current) use of systemic steroids: Secondary | ICD-10-CM | POA: Insufficient documentation

## 2023-06-02 DIAGNOSIS — M25511 Pain in right shoulder: Secondary | ICD-10-CM | POA: Diagnosis not present

## 2023-06-02 DIAGNOSIS — I700 Atherosclerosis of aorta: Secondary | ICD-10-CM | POA: Diagnosis not present

## 2023-06-02 DIAGNOSIS — J320 Chronic maxillary sinusitis: Secondary | ICD-10-CM | POA: Diagnosis not present

## 2023-06-02 LAB — CBC WITH DIFFERENTIAL (CANCER CENTER ONLY)
Abs Immature Granulocytes: 0.07 10*3/uL (ref 0.00–0.07)
Basophils Absolute: 0 10*3/uL (ref 0.0–0.1)
Basophils Relative: 0 %
Eosinophils Absolute: 0 10*3/uL (ref 0.0–0.5)
Eosinophils Relative: 0 %
HCT: 32.9 % — ABNORMAL LOW (ref 39.0–52.0)
Hemoglobin: 10.7 g/dL — ABNORMAL LOW (ref 13.0–17.0)
Immature Granulocytes: 1 %
Lymphocytes Relative: 4 %
Lymphs Abs: 0.4 10*3/uL — ABNORMAL LOW (ref 0.7–4.0)
MCH: 29.7 pg (ref 26.0–34.0)
MCHC: 32.5 g/dL (ref 30.0–36.0)
MCV: 91.4 fL (ref 80.0–100.0)
Monocytes Absolute: 0.6 10*3/uL (ref 0.1–1.0)
Monocytes Relative: 7 %
Neutro Abs: 8.3 10*3/uL — ABNORMAL HIGH (ref 1.7–7.7)
Neutrophils Relative %: 88 %
Platelet Count: 228 10*3/uL (ref 150–400)
RBC: 3.6 MIL/uL — ABNORMAL LOW (ref 4.22–5.81)
RDW: 16.7 % — ABNORMAL HIGH (ref 11.5–15.5)
WBC Count: 9.5 10*3/uL (ref 4.0–10.5)
nRBC: 0 % (ref 0.0–0.2)

## 2023-06-02 LAB — COMPREHENSIVE METABOLIC PANEL
ALT: 23 U/L (ref 0–44)
AST: 9 U/L — ABNORMAL LOW (ref 15–41)
Albumin: 3.2 g/dL — ABNORMAL LOW (ref 3.5–5.0)
Alkaline Phosphatase: 61 U/L (ref 38–126)
Anion gap: 5 (ref 5–15)
BUN: 35 mg/dL — ABNORMAL HIGH (ref 8–23)
CO2: 25 mmol/L (ref 22–32)
Calcium: 8.1 mg/dL — ABNORMAL LOW (ref 8.9–10.3)
Chloride: 102 mmol/L (ref 98–111)
Creatinine, Ser: 1.4 mg/dL — ABNORMAL HIGH (ref 0.61–1.24)
GFR, Estimated: 52 mL/min — ABNORMAL LOW (ref >60)
Glucose, Bld: 102 mg/dL — ABNORMAL HIGH (ref 70–99)
Potassium: 4.3 mmol/L (ref 3.5–5.1)
Sodium: 132 mmol/L — ABNORMAL LOW (ref 135–145)
Total Bilirubin: 0.4 mg/dL (ref 0.3–1.2)
Total Protein: 7.4 g/dL (ref 6.5–8.1)

## 2023-06-02 LAB — RAD ONC ARIA SESSION SUMMARY
Course Elapsed Days: 11
Plan Fractions Treated to Date: 10
Plan Fractions Treated to Date: 10
Plan Name: 615
Plan Prescribed Dose Per Fraction: 3 Gy
Plan Prescribed Dose Per Fraction: 3 Gy
Plan Total Fractions Prescribed: 10
Plan Total Fractions Prescribed: 10
Plan Total Prescribed Dose: 30 Gy
Plan Total Prescribed Dose: 30 Gy
Reference Point Dosage Given to Date: 30 Gy
Reference Point Dosage Given to Date: 30 Gy
Reference Point Session Dosage Given: 3 Gy
Reference Point Session Dosage Given: 3 Gy
Session Number: 10

## 2023-06-02 MED ORDER — LENALIDOMIDE 10 MG PO CAPS
10.0000 mg | ORAL_CAPSULE | Freq: Every day | ORAL | 0 refills | Status: DC
Start: 2023-06-02 — End: 2023-06-05

## 2023-06-02 MED ORDER — LENALIDOMIDE 10 MG PO CAPS
ORAL_CAPSULE | ORAL | 0 refills | Status: DC
Start: 2023-06-02 — End: 2023-06-02

## 2023-06-02 NOTE — Progress Notes (Signed)
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  Patient was seen by me as an inpatient on 05/20/2023.  He noted that his vision may be slightly better with the steroid. Had some mild insomnia but it was not too bothersome. Has been seen by radiation oncology and is going to be set up for CT simulation and to start radiation 05/21/2023.  Did have a CT chest abdomen pelvis without contrast to evaluate for any other soft tissue metastatic disease and whole-body skeletal survey to determine other overt progression and to rule out the possibility of further source of metastatic disease.  Today, he is accompanied by two family members. He reports that he is feeling well overall. He reports that his vision has improved and returned to baseline. He denies any glares in his vision which were previously present. He is able to count fingers and notes improved sight of colors on television.   His p.o. intake is normal and he notes drinking 4 16oz bottles daily. He denies any fevers, chills, night sweats, new back pain, fatigue, posterior neck pain, or abdominal pain.  He reports some nausea with previously taking Revlimid. Patient reports that he has an upcoming dental appointment.  MEDICAL HISTORY:  Past Medical History:  Diagnosis Date   Arthritis    CKD (chronic kidney disease) stage 3, GFR 30-59 ml/min (HCC) 20015   Creat 1.9   Hypertension    Multiple myeloma (HCC) 2016   [facility]   Prostate disorder 02/2017    SURGICAL HISTORY: Past Surgical History:  Procedure Laterality Date   EYE SURGERY Bilateral 01/30/2018   KNEE  SURGERY     over ten years ago   NEPHRECTOMY Right 03/2015   ROBOTIC ASSITED PARTIAL NEPHRECTOMY Left 07/07/2021   Procedure: XI ROBOTIC ASSITED PARTIAL NEPHRECTOMY;  Surgeon: [name], MD;  Location: [facility];  Service: Urology;  Laterality: Left;  5 HRS   XI ROBOTIC ASSISTED SIMPLE PROSTATECTOMY N/A 07/07/2021   Procedure: XI ROBOTIC ASSISTED SIMPLE PROSTATECTOMY;  Surgeon: [name], MD;  Location: [facility];  Service: Urology;  Laterality: N/A;    SOCIAL HISTORY: Social History   Socioeconomic History   Marital status: Married    Spouse name: Museum/gallery curator   Number of children: 2   Years of education: Not on file   Highest education level: Not on file  Occupational History   Occupation: Retired  Tobacco Use   Smoking status: Never   Smokeless tobacco: Never  Vaping Use   Vaping Use: Never used  Substance and Sexual Activity   Alcohol use: No   Drug use: No   Sexual activity: Not on file  Other Topics Concern   Not on file  Social History Narrative   Not on file   Social Determinants of Health   Financial Resource Strain: Not on file  Food Insecurity: No Food Insecurity (05/20/2023)   Hunger Vital Sign    Worried About Running Out of Food in the Last Year: Never true    Ran Out of Food in the Last Year: Never true  Transportation Needs: No Transportation Needs (05/20/2023)   PRAPARE - Transportation    Lack  of Transportation (Medical): No    Lack of Transportation (Non-Medical): No  Physical Activity: Not on file  Stress: Not on file  Social Connections: Not on file  Intimate Partner Violence: Not At Risk (05/20/2023)   Humiliation, Afraid, Rape, and Kick questionnaire    Fear of Current or Ex-Partner: No    Emotionally Abused: No    Physically Abused: No    Sexually Abused: No    FAMILY HISTORY: Family History  Problem Relation Age of Onset   Diabetes Father    Hypertension Brother     ALLERGIES:  is allergic to penicillins, latex, and  tape.  MEDICATIONS:  Current Outpatient Medications  Medication Sig Dispense Refill   acyclovir (ZOVIRAX) 400 MG tablet Take 1 tablet (400 mg total) by mouth 2 (two) times daily. 60 tablet 11   amLODipine (NORVASC) 10 MG tablet Take 10 mg by mouth daily.     aspirin EC 81 MG tablet Take 81 mg by mouth daily.     carvedilol (COREG) 6.25 MG tablet Take 6.25 mg by mouth 2 (two) times daily with a meal.     dexamethasone (DECADRON) 4 MG tablet Take 1 tablet (4 mg total) by mouth 2 (two) times daily with a meal. 60 tablet 0   dexamethasone (DECADRON) 4 MG tablet Take 5 tablets (20 mg total) by mouth once a week. Take the day after darzalex faspro. Take with breakfast. 20 tablet 11   diclofenac Sodium (VOLTAREN) 1 % GEL Apply 2 g topically 4 (four) times daily.     docusate sodium (COLACE) 100 MG capsule Take 1 capsule (100 mg total) by mouth daily as needed for mild constipation.     ferrous sulfate 325 (65 FE) MG tablet Take 1 tablet (325 mg total) by mouth 2 (two) times daily as needed (iron).     finasteride (PROSCAR) 5 MG tablet Take 5 mg by mouth daily.     fluticasone (FLONASE) 50 MCG/ACT nasal spray Place 2 sprays into both nostrils daily as needed for allergies.     furosemide (LASIX) 20 MG tablet Take 20 mg by mouth daily.     lactulose (CHRONULAC) 10 GM/15ML solution Take 10 g by mouth daily as needed for constipation.  5   lenalidomide (REVLIMID) 10 MG capsule Take for 21 days on, 7 days off, repeat every 28 days. 21 capsule 0   ondansetron (ZOFRAN) 8 MG tablet Take 1 tablet (8 mg total) by mouth every 8 (eight) hours as needed for nausea or vomiting. 30 tablet 1   prochlorperazine (COMPAZINE) 10 MG tablet Take 1 tablet (10 mg total) by mouth every 6 (six) hours as needed for nausea or vomiting. 30 tablet 1   tamsulosin (FLOMAX) 0.4 MG CAPS capsule Take 1 capsule (0.4 mg total) by mouth daily after supper. 30 capsule 1   No current facility-administered medications for this visit.     REVIEW OF SYSTEMS:    10 Point review of Systems was done is negative except as noted above.  PHYSICAL EXAMINATION: ECOG PERFORMANCE STATUS: 2 - Symptomatic, <50% confined to bed  .There were no vitals filed for this visit. There were no vitals filed for this visit. .There is no height or weight on file to calculate BMI.  GENERAL:alert, in no acute distress and comfortable SKIN: no acute rashes, no significant lesions EYES: conjunctiva are pink and non-injected, sclera anicteric OROPHARYNX: MMM, no exudates, no oropharyngeal erythema or ulceration NECK: supple, no JVD LYMPH:  no palpable lymphadenopathy in  the cervical, axillary or inguinal regions LUNGS: clear to auscultation b/l with normal respiratory effort HEART: regular rate & rhythm ABDOMEN:  normoactive bowel sounds , non tender, not distended. Extremity: no pedal edema PSYCH: alert & oriented x 3 with fluent speech NEURO: no focal motor/sensory deficits  LABORATORY DATA:  I have reviewed the data as listed  .    Latest Ref Rng & Units 06/02/2023   10:36 AM 05/21/2023    5:33 AM 05/20/2023   11:48 AM  CBC  WBC 4.0 - 10.5 K/uL 9.5  5.2  5.3   Hemoglobin 13.0 - 17.0 g/dL 40.9  9.9  81.1   Hematocrit 39.0 - 52.0 % 32.9  30.3  34.6   Platelets 150 - 400 K/uL 228  242  266     .    Latest Ref Rng & Units 05/23/2023    5:41 AM 05/22/2023    5:44 AM 05/21/2023    5:33 AM  CMP  Glucose 70 - 99 mg/dL 914  782  956   BUN 8 - 23 mg/dL 33  32  30   Creatinine 0.61 - 1.24 mg/dL 2.13  0.86  5.78   Sodium 135 - 145 mmol/L 132  128  126   Potassium 3.5 - 5.1 mmol/L 4.4  4.4  4.5   Chloride 98 - 111 mmol/L 108  104  102   CO2 22 - 32 mmol/L 21  20  20    Calcium 8.9 - 10.3 mg/dL 7.9  7.8  8.3   Total Protein 6.5 - 8.1 g/dL   9.8   Total Bilirubin 0.3 - 1.2 mg/dL   0.4   Alkaline Phos 38 - 126 U/L   44   AST 15 - 41 U/L   15   ALT 0 - 44 U/L   17      RADIOGRAPHIC STUDIES: I have personally reviewed the radiological  images as listed and agreed with the findings in the report. CT CHEST ABDOMEN PELVIS WO CONTRAST  Result Date: 05/21/2023 CLINICAL DATA:  Renal cell carcinoma and myeloma; * Tracking Code: BO * EXAM: CT CHEST, ABDOMEN AND PELVIS WITHOUT CONTRAST TECHNIQUE: Multidetector CT imaging of the chest, abdomen and pelvis was performed following the standard protocol without IV contrast. RADIATION DOSE REDUCTION: This exam was performed according to the departmental dose-optimization program which includes automated exposure control, adjustment of the mA and/or kV according to patient size and/or use of iterative reconstruction technique. COMPARISON:  CT abdomen and pelvis dated May 05, 2023 FINDINGS: CT CHEST FINDINGS Cardiovascular: Normal heart size. No pericardial effusion. Normal caliber thoracic aorta with mild atherosclerotic disease. Mediastinum/Nodes: Esophagus and thyroid are unremarkable. No enlarged lymph nodes seen in the chest. Lungs/Pleura: Central airways are patent. Bibasilar atelectasis. No consolidation, pleural effusion or pneumothorax. Musculoskeletal: Numerous scattered lucent lesions seen involving the ribs, sternum, thoracic spine and bilateral clavicles. A large lucent lesion of the T6 vertebral body involvement of the posterior cortex, extension into the spinal canal can not be excluded. CT ABDOMEN PELVIS FINDINGS Hepatobiliary: Scattered small low-attenuation liver lesions which are too small to accurately characterize. Hypoattenuating lesions of the central liver located on series 2, image 51 and hepatic dome on image 46, previously characterized as benign hemangiomas. Gallbladder is unremarkable. Pancreas: Unremarkable. No pancreatic ductal dilatation or surrounding inflammatory changes. Spleen: Normal in size without focal abnormality. Adrenals/Urinary Tract: No adrenal nodules. Prior right nephrectomy. Left adrenal gland is unremarkable. No left hydronephrosis. Status post partial left  nephrectomy.  Simple appearing left renal cyst, no specific follow-up imaging is recommended. Bladder is unremarkable. Stomach/Bowel: Stomach is within normal limits. Appendix appears normal. Mild diverticulosis. No evidence of bowel wall thickening, distention, or inflammatory changes. Vascular/Lymphatic: No significant vascular findings are present. No enlarged abdominal or pelvic lymph nodes. Reproductive: Moderate prostatomegaly status post TURP. Other: Large hernia of the lateral right abdominal wall containing nondilated lead loops of small and large bowel. No abdominopelvic ascites. No intra-abdominal free air. Musculoskeletal: Numerous lytic osseous lesions seen throughout the thoracic spine and pelvis. Mild compression fracture of L4 with no evidence of retropulsion, unchanged when compared with the prior exam. IMPRESSION: 1. Numerous lytic osseous lesions, consistent with patient's history of multiple myeloma. A large lucent lesion of the T6 vertebral body involves the posterior cortex, extension into the spinal canal can not be excluded. Consider further evaluation with contrast-enhanced MRI of the thoracic spine. 2. Status post right nephrectomy no evidence of recurrent mass in the right nephrectomy bed. 3. No evidence of soft tissue metastatic disease in the chest, abdomen or pelvis. 4. Mild aortic Atherosclerosis (ICD10-I70.0). Electronically Signed   By: Allegra Lai M.D.   On: 05/21/2023 20:22   DG Bone Survey Met  Result Date: 05/21/2023 CLINICAL DATA:  Multiple myeloma EXAM: METASTATIC BONE SURVEY COMPARISON:  None Available. FINDINGS: There are multiple small round lucencies of varying sizes in calvarium. Sella appears largest unusual in size. There are few radiolucent lesions of varying sizes in the shaft of right humerus. Possible small low-density focus is seen in the mid shaft of right clavicle. There are radiolucencies in the distal shaft of left humerus. Small faint lucencies are seen  in the proximal shaft of right radius and distal shaft of right ulna. Possible small faint lucency is seen in the distal shaft of left ulna. Degenerative changes are noted with bony spurs in cervical and thoracic spine. There are no focal infiltrates in the lung fields. Deformities in the posterolateral aspects of right fifth and sixth ribs suggest old healed fractures. Possible small faint lucencies are seen in the posterior aspects of left fourth and sixth ribs. Few small lucencies are noted in left iliac bone and proximal right femur. There is linear lucency in the proximal shaft of right femur. Small faint lucency is seen in the distal shaft of right femur. There are few low-density lesions of varying sizes in the shaft of left femur. IMPRESSION: There are multiple radiolucencies of varying sizes in axial and appendicular skeleton as described in the body of the report consistent with multiple myeloma lesions. Electronically Signed   By: Ernie Avena M.D.   On: 05/21/2023 19:36   MR Brain W and Wo Contrast  Addendum Date: 05/20/2023   ADDENDUM REPORT: 05/20/2023 15:48 ADDENDUM: Lytic lesions have been identified previously with this patient. 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 Electronically Signed   By: Marin Roberts M.D.   On: 05/20/2023 15:48   Result Date: 05/20/2023 CLINICAL DATA:  Visual is turbinates to the left eye. Abnormal CT scan. EXAM: MRI HEAD WITHOUT AND WITH CONTRAST TECHNIQUE: Multiplanar, multiecho pulse sequences of the brain and surrounding structures were obtained without and with intravenous contrast. CONTRAST:  8mL GADAVIST GADOBUTROL 1 MMOL/ML IV SOLN COMPARISON:  CT of the head and maxillofacial 02/25/2021. FINDINGS: Brain: No acute infarct, hemorrhage, or mass lesion is present. Mild atrophy and white matter changes are likely within normal limits for age. The ventricles are of normal size. No significant  extraaxial fluid collection  is present. Deep brain nuclei are within normal limits. Enhancing osseous tumor extends to the anterior margin of the right IAC. Internal auditory canals are otherwise within normal limits bilaterally. Inner ear structures are within normal limits. Vascular: Insert normal flow Skull and upper cervical spine: A slightly heterogeneous enhancing mass lesion is again noted at the skull base. This extends along the petrous ridge bilaterally, greater on the right. Tumor surrounds the right internal carotid artery without definite stenosis. There is some heterogeneity to the tumor on precontrast T2 weighted imaging. The lesion measures 7.8 x 5.6 x 3.3 cm. Multiple additional enhancing lesions are present throughout the skull. Lesion in the anterior right frontal skull measures 11 mm on image 110 of series 17. A lesion in the left parietal skull on image 107 of series 17 measures 11 mm. Multiple other smaller lesions are present as well. Sinuses/Orbits: Minimal mucosal thickening scratched at minimal fluid is present in the right maxillary sinus. The paranasal sinuses are otherwise clear. A right mastoid effusion is present. The tumor impacts the right eustachian tube. Other: IMPRESSION: 1. 7.8 x 5.6 x 3.3 cm enhancing mass lesion at the skull base extending along the petrous ridge bilaterally, greater on the right. 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 There are some intrinsic T2 signal scratched at there is some intrinsic T2 signal and chordoma still considered. However, given the multiple other enhancing lesions throughout the skull, this most likely represents metastatic disease. 2. No acute intracranial abnormality. 3. Mild atrophy and white matter changes are likely within normal limits for age. 4. Right mastoid effusion secondary to obstruction of the eustachian tube. 5. Minimal right maxillary sinus disease. Electronically Signed: By: Marin Roberts M.D. On: 05/20/2023 15:33   MR ORBITS W WO  CONTRAST  Result Date: 05/20/2023 CLINICAL DATA:  Visual disturbance to the left eye for 3 weeks. Vision loss, monocular. Skull base mass. EXAM: MRI OF THE ORBITS WITHOUT AND WITH CONTRAST TECHNIQUE: Multiplanar, multi-echo pulse sequences of the orbits and surrounding structures were acquired including fat saturation techniques, before and after intravenous contrast administration. CONTRAST:  8mL GADAVIST GADOBUTROL 1 MMOL/ML IV SOLN COMPARISON:  CT head without contrast 05/20/2023. MR head without and with contrast 05/20/2023 FINDINGS: Enhancing skull base tumor centered in the clivus measuring 7.8 x 5.3 x 3.7 cm. Tumor extends along the petrous apex, right greater than left. The tumor surrounds the petrous right ICA. Tumor extends into the floor of the sella abuts the cavernous sinus bilaterally. Tumor invades the posterior ethmoid air cells and sphenoid sinuses bilaterally. Tumor extends to the left orbital apex and likely impacts the left optic nerve. The optic chiasm is within normal limits. Tumor extends into the nasal cavity more prominently on the left. Multiple additional foci of enhancement are present within the skull. Orbits: Bilateral lens replacements are present. The globes are within normal limits. The optic nerve is within normal limits within the canal bilaterally. Extraocular muscles are unremarkable. The superior ophthalmic vein is symmetric. Visualized sinuses: Tumor extends into the posterior ethmoid air cells and sphenoid sinuses as described. A right mastoid effusion is secondary to obstruction. Soft tissues: Extracranial soft tissues are unremarkable. Limited intracranial: Visualized intracranial contents are within normal limits. IMPRESSION: 1. Enhancing skull base tumor centered in the clivus measuring 7.8 x 5.3 x 3.7 cm. Tumor extends along the petrous apex, right greater than left. The tumor surrounds the petrous right ICA. Tumor extends into the floor of the  sella abuts the cavernous  sinus bilaterally. Tumor invades the posterior ethmoid air cells and sphenoid sinuses bilaterally. Tumor extends to the left orbital apex and likely impacts the left optic nerve. Based on the initial CT, consideration of chordoma was made. Although there is some intrinsic T2 signal and heterogeneous enhancement, the multiple other enhancing lesions suggest this is most likely metastatic disease. Tumor extending into the nasal cavity may be assessable for biopsy. 2. Tumor extends into the nasal cavity more prominently on the left. 3. Right mastoid effusion is secondary to obstruction. Electronically Signed   By: Marin Roberts M.D.   On: 05/20/2023 15:41   CT Head Wo Contrast  Result Date: 05/20/2023 CLINICAL DATA:  Vision loss. EXAM: CT HEAD WITHOUT CONTRAST TECHNIQUE: Contiguous axial images were obtained from the base of the skull through the vertex without intravenous contrast. RADIATION DOSE REDUCTION: This exam was performed according to the departmental dose-optimization program which includes automated exposure control, adjustment of the mA and/or kV according to patient size and/or use of iterative reconstruction technique. COMPARISON:  02/25/2021. FINDINGS: Brain: Large, hyperattenuating mass enlarges and erodes the clivus, extending through the sella turcica, obliterating the sphenoid sinuses and extending into the posterior ethmoid air cells. Mass diffusely involves the cavernous sinuses, extending more laterally on the left than on the right. The mass broadly contacts the optic nerves from the orbital apices to anterior to the optic chiasm. Mass measures 6.2 cm from right to left, 4 cm from superior to inferior and 5.2 cm anterior to posterior. No other extra-axial masses. No intra-axial masses. No evidence of an infarct and no intracranial hemorrhage. No hydrocephalus. Vascular: No hyperdense vessel or unexpected calcification. Mass broadly involves both cavernous sinuses as detailed above.  Skull: In addition to the clivus, mass erodes the petrous portions of both temporal bones as well as the sphenoid bone. Sinuses/Orbits: No abnormality of either globe. No intraorbital abnormality. Sinuses unaffected by the mass are clear. Other: None. IMPRESSION: 1. Large mass at the skull base, eroding and expanding the clivus, also involving the sphenoid bone and the petrous portions of both temporal bones. The mass broadly abuts both optic nerves between the chiasm and orbital apices. Mass also broadly involves both cavernous sinuses. 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 Recommend further assessment with brain MRI without and with contrast. 2. No acute finding. No evidence of an infarct, hydrocephalus or intracranial hemorrhage. Electronically Signed   By: Amie Portland M.D.   On: 05/20/2023 10:22   DG Chest 2 View  Result Date: 05/06/2023 CLINICAL DATA:  77 year old male with history of right clavicular pain. EXAM: CHEST - 2 VIEW COMPARISON:  Chest x-ray 05/02/2023. FINDINGS: Mildly displaced fracture of the medial right clavicle. Lucency in the region of the medial right clavicle suggesting an underlying bony lesion. Old healed fractures of the lateral right fifth and sixth ribs with abundant callus formation. Lung volumes are normal. No consolidative airspace disease. No pleural effusions. No pneumothorax. No pulmonary nodule or mass noted. Pulmonary vasculature and the cardiomediastinal silhouette are within normal limits. IMPRESSION: 1.  No radiographic evidence of acute cardiopulmonary disease. 2. Probable pathologic fracture of the medial right clavicle better demonstrated on dedicated right clavicular radiographs. 3. Old healed fractures of the lateral right fifth and sixth ribs. Electronically Signed   By: Trudie Reed M.D.   On: 05/06/2023 09:56   DG Clavicle Right  Result  Date: 05/06/2023 CLINICAL DATA:   77 year old male with history of pain in the region of the right clavicle. EXAM: RIGHT CLAVICLE - 2+ VIEWS COMPARISON:  No priors. FINDINGS: Multiple lucent lesions in the right clavicle, which could reflect metastatic lesions in this patient with history of renal cell carcinoma, or could be myelomatous lesions. Mildly displaced fracture of the medial right clavicle noted with approximately 8 mm of inferior displacement of the distal aspect of the clavicle. IMPRESSION: 1. Mildly displaced likely pathologic fracture of the medial right clavicle, as above. Multiple additional lytic lesions in the clavicle could represent myelomatous lesions or metastatic lesions in this patient with history of renal cell carcinoma. Electronically Signed   By: Trudie Reed M.D.   On: 05/06/2023 09:54   DG Shoulder Right  Result Date: 05/06/2023 CLINICAL DATA:  Right shoulder pain. EXAM: RIGHT SHOULDER - 3 VIEW COMPARISON:  None Available. FINDINGS: No acute fracture or dislocation. Degenerative spurring at the acromioclavicular joint. Few subtle lucent areas are seen over the humeral diaphysis (with endosteal scalloping) and at the mid clavicle. Remote fifth and sixth rib fractures with hypertrophic callus. There is known history of multiple myeloma with lesion seen on chest CT from 09/26/2022 IMPRESSION: 1. No acute finding. 2. Known myeloma. Electronically Signed   By: Tiburcio Pea M.D.   On: 05/06/2023 08:41    ASSESSMENT & PLAN:  77 year old male with   #1 Progressive IgG kappa myeloma: Initially diagnosed in 2016 as stage II disease with standard risk cytogenetics today. 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 Status post treatment as noted above. Last seen by Dr. Lalla Brothers at [facility] in January 2024 and noted to have progressive disease with increase in his IgG kappa M spike at 2.87 g/dL.   Recent light chains done on 05/20/2023 at [facility] showed increase in free kappa light  chains to 603 with a kappa lambda ratio of 25 this is up from kappa light chains of 402 with a kappa lambda ratio of 12.6 on 01/03/2023. CBC shows relatively stable hemoglobin of 11.3 with normal WBC count and platelets BMP shows stable chronic kidney disease with a creatinine of 1.87.  No overt hypercalcemia with a calcium of 9   #2 recent right clavicular pain with x-ray on 05/06/2023 showing mildly displaced likely pathologic fracture of the medial right clavicle.  Multiple other lytic lesions noted.   #3 current hospitalization with visual difficulty and loss of vision referred to the emergency room by his ophthalmologist. CT head done today 05/20/2023 shows  Large mass at the skull base, eroding and expanding the clivus, also involving the sphenoid bone and the petrous portions of both temporal bones. The mass broadly abuts both optic nerves between the chiasm and orbital apices. Mass also broadly involves both cavernous sinuses. 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 Recommend further assessment with brain MRI without and with contrast.   MRI brain 05/20/2023 1. 7.8 x 5.6 x 3.3 cm enhancing mass lesion at the skull base extending along the petrous ridge bilaterally, greater on the right. 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 There are some intrinsic T2 signal scratched at there is some intrinsic T2 signal and chordoma still considered. However, given the multiple other enhancing lesions throughout the skull, this most likely represents metastatic disease. 2. No acute intracranial abnormality. 3. Mild atrophy and white matter changes  are likely within normal limits for age. 4. Right mastoid effusion secondary to obstruction of the eustachian tube. 5. Minimal right maxillary sinus disease.   ADDENDUM: Lytic lesions have been identified previously with this patient. 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   #4 hyponatremia   #5 history of renal cell carcinoma right nephrectomy on 02/03/2016.  He received cryotherapy to his left renal mass in April 2017. Surgically resected by Dr. [name] at [facility] on July 07, 2021. 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 Tumor is limited to the kidney (pT1a).   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 CBC showed WBC of 9.5K, hemoglobin improved to 10.7, and platelets of 228K. -patient was scheduled for education counseling today -patient is scheduled to complete radiation therapy on 06/02/2023 -once patient is finished with radiation therapy, will proceed with second line of treatment with daratumumab Revlimid dexamethasone as outpatient. 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  currently on dexamethasone 4 mg p.o. twice daily with further tapering as per radiation oncology. -recommend patient to avoid driving until officially cleared by ophalmologist -Patient has a scheduled appt with ophalmologist in July 2024, patient shall continue to follow with ophthmologist regularly for optimal care -will order PET scan as outpatient to evaluate disease status -patient is scheduled to begin infusion on 06/06/2023  FOLLOW-UP: PET/CT in 5-7 days Plz schedule C1 and C2 of [place] per orders. Labs with each  treatment MD visit with C1D8 for toxicity check.  The total time spent in the appointment was 40 minutes* .  All of the patient's questions were answered with apparent satisfaction. The patient knows to call the clinic with any problems, questions or concerns.   Wyvonnia Lora MD MS AAHIVMS [facility] Hematology/Oncology Physician [facility]  .*Total Encounter Time as defined by the Centers for Medicare and Medicaid Services includes, in addition to the face-to-face time of a patient visit (documented in the note above) non-face-to-face time: obtaining and reviewing outside history, ordering and reviewing medications, tests or procedures, care coordination (communications with other health care professionals or caregivers) and documentation in the medical record.    I,Mitra Faeizi,acting as a Neurosurgeon for Wyvonnia Lora, MD.,have documented all relevant documentation on the behalf of Wyvonnia Lora, MD,as directed by  Wyvonnia Lora, MD while in the presence of Wyvonnia Lora, MD.  .I have reviewed the above documentation for accuracy and completeness, and I agree with the above. [name] MD

## 2023-06-03 NOTE — Progress Notes (Signed)
Pharmacist Chemotherapy Monitoring - Initial Assessment    Anticipated start date: 06/06/23   The following has been reviewed per standard work regarding the patient's treatment regimen: The patient's diagnosis, treatment plan and drug doses, and organ/hematologic function Lab orders and baseline tests specific to treatment regimen  The treatment plan start date, drug sequencing, and pre-medications Prior authorization status  Patient's documented medication list, including drug-drug interaction screen and prescriptions for anti-emetics and supportive care specific to the treatment regimen The drug concentrations, fluid compatibility, administration routes, and timing of the medications to be used The patient's access for treatment and lifetime cumulative dose history, if applicable  The patient's medication allergies and previous infusion related reactions, if applicable   Changes made to treatment plan:  treatment plan date  Follow up needed:  Draw labs: T&S, Pretreatment RBC for Brooke Bonito, RPH,  BCPS, BCOP 06/03/2023  9:58 AM

## 2023-06-05 ENCOUNTER — Emergency Department (HOSPITAL_COMMUNITY): Payer: Medicare HMO

## 2023-06-05 ENCOUNTER — Other Ambulatory Visit: Payer: Self-pay

## 2023-06-05 ENCOUNTER — Observation Stay (HOSPITAL_COMMUNITY)
Admission: EM | Admit: 2023-06-05 | Discharge: 2023-06-06 | Disposition: A | Discharge status: Home / Self Care | Payer: Medicare HMO | Attending: Internal Medicine | Admitting: Internal Medicine

## 2023-06-05 ENCOUNTER — Encounter (HOSPITAL_COMMUNITY): Payer: Self-pay | Admitting: Internal Medicine

## 2023-06-05 DIAGNOSIS — C9002 Multiple myeloma in relapse: Secondary | ICD-10-CM

## 2023-06-05 DIAGNOSIS — R4182 Altered mental status, unspecified: Secondary | ICD-10-CM | POA: Diagnosis not present

## 2023-06-05 DIAGNOSIS — K5730 Diverticulosis of large intestine without perforation or abscess without bleeding: Secondary | ICD-10-CM | POA: Diagnosis not present

## 2023-06-05 DIAGNOSIS — D696 Thrombocytopenia, unspecified: Secondary | ICD-10-CM | POA: Diagnosis not present

## 2023-06-05 DIAGNOSIS — R55 Syncope and collapse: Secondary | ICD-10-CM | POA: Diagnosis present

## 2023-06-05 DIAGNOSIS — E871 Hypo-osmolality and hyponatremia: Secondary | ICD-10-CM | POA: Diagnosis not present

## 2023-06-05 DIAGNOSIS — E872 Acidosis, unspecified: Secondary | ICD-10-CM | POA: Diagnosis not present

## 2023-06-05 DIAGNOSIS — N179 Acute kidney failure, unspecified: Secondary | ICD-10-CM | POA: Diagnosis not present

## 2023-06-05 DIAGNOSIS — D62 Acute posthemorrhagic anemia: Secondary | ICD-10-CM | POA: Diagnosis not present

## 2023-06-05 DIAGNOSIS — N1831 Chronic kidney disease, stage 3a: Secondary | ICD-10-CM | POA: Diagnosis not present

## 2023-06-05 DIAGNOSIS — I129 Hypertensive chronic kidney disease with stage 1 through stage 4 chronic kidney disease, or unspecified chronic kidney disease: Secondary | ICD-10-CM | POA: Insufficient documentation

## 2023-06-05 DIAGNOSIS — K3189 Other diseases of stomach and duodenum: Secondary | ICD-10-CM | POA: Insufficient documentation

## 2023-06-05 DIAGNOSIS — Z1152 Encounter for screening for COVID-19: Secondary | ICD-10-CM | POA: Insufficient documentation

## 2023-06-05 DIAGNOSIS — K5710 Diverticulosis of small intestine without perforation or abscess without bleeding: Secondary | ICD-10-CM | POA: Diagnosis not present

## 2023-06-05 DIAGNOSIS — Z79899 Other long term (current) drug therapy: Secondary | ICD-10-CM | POA: Diagnosis not present

## 2023-06-05 DIAGNOSIS — I517 Cardiomegaly: Secondary | ICD-10-CM | POA: Diagnosis not present

## 2023-06-05 DIAGNOSIS — C9000 Multiple myeloma not having achieved remission: Secondary | ICD-10-CM | POA: Diagnosis not present

## 2023-06-05 DIAGNOSIS — Z91040 Latex allergy status: Secondary | ICD-10-CM | POA: Insufficient documentation

## 2023-06-05 DIAGNOSIS — Z743 Need for continuous supervision: Secondary | ICD-10-CM | POA: Diagnosis not present

## 2023-06-05 DIAGNOSIS — K922 Gastrointestinal hemorrhage, unspecified: Secondary | ICD-10-CM | POA: Diagnosis not present

## 2023-06-05 DIAGNOSIS — K2971 Gastritis, unspecified, with bleeding: Principal | ICD-10-CM | POA: Insufficient documentation

## 2023-06-05 DIAGNOSIS — I10 Essential (primary) hypertension: Secondary | ICD-10-CM | POA: Diagnosis not present

## 2023-06-05 DIAGNOSIS — K269 Duodenal ulcer, unspecified as acute or chronic, without hemorrhage or perforation: Secondary | ICD-10-CM | POA: Insufficient documentation

## 2023-06-05 DIAGNOSIS — R000 Tachycardia, unspecified: Secondary | ICD-10-CM | POA: Diagnosis not present

## 2023-06-05 DIAGNOSIS — K2970 Gastritis, unspecified, without bleeding: Secondary | ICD-10-CM | POA: Diagnosis not present

## 2023-06-05 DIAGNOSIS — I959 Hypotension, unspecified: Secondary | ICD-10-CM | POA: Diagnosis not present

## 2023-06-05 DIAGNOSIS — D649 Anemia, unspecified: Secondary | ICD-10-CM | POA: Diagnosis not present

## 2023-06-05 DIAGNOSIS — K921 Melena: Secondary | ICD-10-CM | POA: Diagnosis not present

## 2023-06-05 DIAGNOSIS — Z7189 Other specified counseling: Secondary | ICD-10-CM

## 2023-06-05 DIAGNOSIS — K2980 Duodenitis without bleeding: Secondary | ICD-10-CM | POA: Diagnosis not present

## 2023-06-05 DIAGNOSIS — R739 Hyperglycemia, unspecified: Secondary | ICD-10-CM | POA: Diagnosis not present

## 2023-06-05 DIAGNOSIS — R404 Transient alteration of awareness: Secondary | ICD-10-CM | POA: Diagnosis not present

## 2023-06-05 DIAGNOSIS — K317 Polyp of stomach and duodenum: Secondary | ICD-10-CM | POA: Diagnosis not present

## 2023-06-05 DIAGNOSIS — Z7952 Long term (current) use of systemic steroids: Secondary | ICD-10-CM | POA: Diagnosis not present

## 2023-06-05 DIAGNOSIS — C649 Malignant neoplasm of unspecified kidney, except renal pelvis: Secondary | ICD-10-CM | POA: Diagnosis not present

## 2023-06-05 DIAGNOSIS — Z7982 Long term (current) use of aspirin: Secondary | ICD-10-CM | POA: Diagnosis not present

## 2023-06-05 DIAGNOSIS — S2231XA Fracture of one rib, right side, initial encounter for closed fracture: Secondary | ICD-10-CM | POA: Diagnosis not present

## 2023-06-05 LAB — AMMONIA: Ammonia: 16 umol/L (ref 9–35)

## 2023-06-05 LAB — CBC WITH DIFFERENTIAL/PLATELET
Abs Immature Granulocytes: 0.12 10*3/uL — ABNORMAL HIGH (ref 0.00–0.07)
Basophils Absolute: 0 10*3/uL (ref 0.0–0.1)
Basophils Relative: 0 %
Eosinophils Absolute: 0 10*3/uL (ref 0.0–0.5)
Eosinophils Relative: 0 %
HCT: 19.6 % — ABNORMAL LOW (ref 39.0–52.0)
Hemoglobin: 6.1 g/dL — CL (ref 13.0–17.0)
Immature Granulocytes: 2 %
Lymphocytes Relative: 14 %
Lymphs Abs: 1.1 10*3/uL (ref 0.7–4.0)
MCH: 29.8 pg (ref 26.0–34.0)
MCHC: 31.1 g/dL (ref 30.0–36.0)
MCV: 95.6 fL (ref 80.0–100.0)
Monocytes Absolute: 0.7 10*3/uL (ref 0.1–1.0)
Monocytes Relative: 8 %
Neutro Abs: 6.3 10*3/uL (ref 1.7–7.7)
Neutrophils Relative %: 76 %
Platelets: 136 10*3/uL — ABNORMAL LOW (ref 150–400)
RBC: 2.05 MIL/uL — ABNORMAL LOW (ref 4.22–5.81)
RDW: 17.4 % — ABNORMAL HIGH (ref 11.5–15.5)
WBC: 8.2 10*3/uL (ref 4.0–10.5)
nRBC: 0.7 % — ABNORMAL HIGH (ref 0.0–0.2)

## 2023-06-05 LAB — URINALYSIS, W/ REFLEX TO CULTURE (INFECTION SUSPECTED)
Bacteria, UA: NONE SEEN
Bilirubin Urine: NEGATIVE
Glucose, UA: NEGATIVE mg/dL
Ketones, ur: NEGATIVE mg/dL
Nitrite: NEGATIVE
Protein, ur: NEGATIVE mg/dL
Specific Gravity, Urine: 1.036 — ABNORMAL HIGH (ref 1.005–1.030)
pH: 5 (ref 5.0–8.0)

## 2023-06-05 LAB — URINE CULTURE

## 2023-06-05 LAB — I-STAT VENOUS BLOOD GAS, ED
Acid-base deficit: 16 mmol/L — ABNORMAL HIGH (ref 0.0–2.0)
Bicarbonate: 10.8 mmol/L — ABNORMAL LOW (ref 20.0–28.0)
Calcium, Ion: 1.09 mmol/L — ABNORMAL LOW (ref 1.15–1.40)
HCT: 18 % — ABNORMAL LOW (ref 39.0–52.0)
Hemoglobin: 6.1 g/dL — CL (ref 13.0–17.0)
O2 Saturation: 56 %
Potassium: 4.3 mmol/L (ref 3.5–5.1)
Sodium: 133 mmol/L — ABNORMAL LOW (ref 135–145)
TCO2: 12 mmol/L — ABNORMAL LOW (ref 22–32)
pCO2, Ven: 27.1 mmHg — ABNORMAL LOW (ref 44–60)
pH, Ven: 7.207 — ABNORMAL LOW (ref 7.25–7.43)
pO2, Ven: 35 mmHg (ref 32–45)

## 2023-06-05 LAB — I-STAT CHEM 8, ED
BUN: 84 mg/dL — ABNORMAL HIGH (ref 8–23)
Calcium, Ion: 1.13 mmol/L — ABNORMAL LOW (ref 1.15–1.40)
Chloride: 107 mmol/L (ref 98–111)
Creatinine, Ser: 1.9 mg/dL — ABNORMAL HIGH (ref 0.61–1.24)
Glucose, Bld: 276 mg/dL — ABNORMAL HIGH (ref 70–99)
HCT: 17 % — ABNORMAL LOW (ref 39.0–52.0)
Hemoglobin: 5.8 g/dL — CL (ref 13.0–17.0)
Potassium: 4.2 mmol/L (ref 3.5–5.1)
Sodium: 132 mmol/L — ABNORMAL LOW (ref 135–145)
TCO2: 12 mmol/L — ABNORMAL LOW (ref 22–32)

## 2023-06-05 LAB — KAPPA/LAMBDA LIGHT CHAINS
Kappa free light chain: 14.4 mg/L (ref 3.3–19.4)
Kappa, lambda light chain ratio: 1.08 (ref 0.26–1.65)
Lambda free light chains: 13.3 mg/L (ref 5.7–26.3)

## 2023-06-05 LAB — TROPONIN I (HIGH SENSITIVITY)
Troponin I (High Sensitivity): 17 ng/L (ref <18)
Troponin I (High Sensitivity): 51 ng/L — ABNORMAL HIGH (ref <18)

## 2023-06-05 LAB — RESP PANEL BY RT-PCR (RSV, FLU A&B, COVID)  RVPGX2
Influenza A by PCR: NEGATIVE
Influenza B by PCR: NEGATIVE
Resp Syncytial Virus by PCR: NEGATIVE
SARS Coronavirus 2 by RT PCR: NEGATIVE

## 2023-06-05 LAB — COMPREHENSIVE METABOLIC PANEL
ALT: 19 U/L (ref 0–44)
AST: 16 U/L (ref 15–41)
Albumin: 1.9 g/dL — ABNORMAL LOW (ref 3.5–5.0)
Alkaline Phosphatase: 35 U/L — ABNORMAL LOW (ref 38–126)
Anion gap: 16 — ABNORMAL HIGH (ref 5–15)
BUN: 75 mg/dL — ABNORMAL HIGH (ref 8–23)
CO2: 11 mmol/L — ABNORMAL LOW (ref 22–32)
Calcium: 7.5 mg/dL — ABNORMAL LOW (ref 8.9–10.3)
Chloride: 105 mmol/L (ref 98–111)
Creatinine, Ser: 1.86 mg/dL — ABNORMAL HIGH (ref 0.61–1.24)
GFR, Estimated: 37 mL/min — ABNORMAL LOW (ref >60)
Glucose, Bld: 298 mg/dL — ABNORMAL HIGH (ref 70–99)
Potassium: 4.2 mmol/L (ref 3.5–5.1)
Sodium: 132 mmol/L — ABNORMAL LOW (ref 135–145)
Total Bilirubin: 0.5 mg/dL (ref 0.3–1.2)
Total Protein: 4.6 g/dL — ABNORMAL LOW (ref 6.5–8.1)

## 2023-06-05 LAB — GLUCOSE, CAPILLARY
Glucose-Capillary: 110 mg/dL — ABNORMAL HIGH (ref 70–99)
Glucose-Capillary: 121 mg/dL — ABNORMAL HIGH (ref 70–99)
Glucose-Capillary: 88 mg/dL (ref 70–99)
Glucose-Capillary: 98 mg/dL (ref 70–99)

## 2023-06-05 LAB — CBG MONITORING, ED: Glucose-Capillary: 210 mg/dL — ABNORMAL HIGH (ref 70–99)

## 2023-06-05 LAB — LACTIC ACID, PLASMA
Lactic Acid, Venous: 7 mmol/L (ref 0.5–1.9)
Lactic Acid, Venous: 3.4 mmol/L (ref 0.5–1.9)
Lactic Acid, Venous: 3.2 mmol/L (ref 0.5–1.9)

## 2023-06-05 LAB — TYPE AND SCREEN

## 2023-06-05 LAB — APTT: aPTT: 21 seconds — ABNORMAL LOW (ref 24–36)

## 2023-06-05 LAB — BASIC METABOLIC PANEL
Anion gap: 13 (ref 5–15)
BUN: 71 mg/dL — ABNORMAL HIGH (ref 8–23)
CO2: 14 mmol/L — ABNORMAL LOW (ref 22–32)
Calcium: 7.4 mg/dL — ABNORMAL LOW (ref 8.9–10.3)
Chloride: 108 mmol/L (ref 98–111)
Creatinine, Ser: 1.71 mg/dL — ABNORMAL HIGH (ref 0.61–1.24)
GFR, Estimated: 41 mL/min — ABNORMAL LOW (ref >60)
Glucose, Bld: 108 mg/dL — ABNORMAL HIGH (ref 70–99)
Potassium: 4.5 mmol/L (ref 3.5–5.1)
Sodium: 135 mmol/L (ref 135–145)

## 2023-06-05 LAB — BPAM RBC: ISSUE DATE / TIME: 202406171510

## 2023-06-05 LAB — HEMOGLOBIN A1C
Hgb A1c MFr Bld: 5.5 % (ref 4.8–5.6)
Mean Plasma Glucose: 111.15 mg/dL

## 2023-06-05 LAB — POC OCCULT BLOOD, ED: Fecal Occult Bld: POSITIVE — AB

## 2023-06-05 LAB — PROTIME-INR
INR: 1.3 — ABNORMAL HIGH (ref 0.8–1.2)
Prothrombin Time: 16.6 seconds — ABNORMAL HIGH (ref 11.4–15.2)

## 2023-06-05 LAB — PREPARE RBC (CROSSMATCH)

## 2023-06-05 MED ORDER — LACTATED RINGERS IV BOLUS
1000.0000 mL | Freq: Once | INTRAVENOUS | Status: AC
  Administered 2023-06-05: 1000 mL via INTRAVENOUS

## 2023-06-05 MED ORDER — INSULIN ASPART 100 UNIT/ML IJ SOLN: 0.0000 [IU] | INTRAMUSCULAR | Status: DC

## 2023-06-05 MED ORDER — VANCOMYCIN HCL IN DEXTROSE 1-5 GM/200ML-% IV SOLN: 1000.0000 mg | Freq: Once | INTRAVENOUS | Status: DC

## 2023-06-05 MED ORDER — ONDANSETRON HCL 4 MG/2ML IJ SOLN: 4.0000 mg | Freq: Four times a day (QID) | INTRAMUSCULAR | Status: DC | PRN

## 2023-06-05 MED ORDER — PANTOPRAZOLE 80MG IVPB - SIMPLE MED
80.0000 mg | Freq: Once | INTRAVENOUS | Status: AC
  Administered 2023-06-05: 80 mg via INTRAVENOUS
  Filled 2023-06-05: qty 100

## 2023-06-05 MED ORDER — LENALIDOMIDE 10 MG PO CAPS
10.0000 mg | ORAL_CAPSULE | Freq: Every day | ORAL | 0 refills | Status: DC
Start: 2023-06-05 — End: 2023-07-03

## 2023-06-05 MED ORDER — SODIUM CHLORIDE 0.9 % IV SOLN
2.0000 g | Freq: Once | INTRAVENOUS | Status: AC
  Administered 2023-06-05: 2 g via INTRAVENOUS
  Filled 2023-06-05: qty 12.5

## 2023-06-05 MED ORDER — SODIUM CHLORIDE 0.9% IV SOLUTION: Freq: Once | INTRAVENOUS | Status: DC

## 2023-06-05 MED ORDER — SODIUM CHLORIDE 0.9 % IV SOLN: INTRAVENOUS | Status: DC

## 2023-06-05 MED ORDER — PANTOPRAZOLE INFUSION (NEW) - SIMPLE MED
8.0000 mg/h | INTRAVENOUS | Status: DC
  Administered 2023-06-05 – 2023-06-06 (×2): 8 mg/h via INTRAVENOUS
  Filled 2023-06-05 (×4): qty 100

## 2023-06-05 MED ORDER — SODIUM CHLORIDE 0.9 % IV SOLN: 2.0000 g | Freq: Once | INTRAVENOUS | Status: DC

## 2023-06-05 MED ORDER — ACETAMINOPHEN 650 MG RE SUPP: 650.0000 mg | Freq: Four times a day (QID) | RECTAL | Status: DC | PRN

## 2023-06-05 MED ORDER — ACETAMINOPHEN 325 MG PO TABS: 650.0000 mg | ORAL_TABLET | Freq: Four times a day (QID) | ORAL | Status: DC | PRN

## 2023-06-05 MED ORDER — ONDANSETRON HCL 4 MG PO TABS: 4.0000 mg | ORAL_TABLET | Freq: Four times a day (QID) | ORAL | Status: DC | PRN

## 2023-06-05 MED ORDER — IOHEXOL 350 MG/ML SOLN
125.0000 mL | Freq: Once | INTRAVENOUS | Status: AC | PRN
  Administered 2023-06-05: 125 mL via INTRAVENOUS

## 2023-06-05 MED ORDER — METRONIDAZOLE 500 MG/100ML IV SOLN
500.0000 mg | Freq: Once | INTRAVENOUS | Status: AC
  Administered 2023-06-05: 500 mg via INTRAVENOUS
  Filled 2023-06-05: qty 100

## 2023-06-05 MED ORDER — VANCOMYCIN HCL 2000 MG/400ML IV SOLN
2000.0000 mg | Freq: Once | INTRAVENOUS | Status: AC
  Administered 2023-06-05: 2000 mg via INTRAVENOUS
  Filled 2023-06-05: qty 400

## 2023-06-05 MED ORDER — SODIUM CHLORIDE 0.9 % IV SOLN
2.0000 g | Freq: Two times a day (BID) | INTRAVENOUS | Status: DC
  Administered 2023-06-05 – 2023-06-06 (×2): 2 g via INTRAVENOUS
  Filled 2023-06-05 (×2): qty 12.5

## 2023-06-05 MED ORDER — VANCOMYCIN HCL 1750 MG/350ML IV SOLN: 1750.0000 mg | INTRAVENOUS | Status: DC

## 2023-06-05 NOTE — ED Triage Notes (Signed)
Pt BIB [facility] for AMS with LOC while on toilet this AM.  EMS endorses pt as diaphoretic with diminished or absent lung sounds on right.

## 2023-06-05 NOTE — Sepsis Progress Note (Signed)
Notified bedside nurse of need to draw repeat lactic acid. 

## 2023-06-05 NOTE — ED Notes (Signed)
ED TO INPATIENT HANDOFF REPORT  ED Nurse Name and Phone #: Marcus Hatfield Name/Age/Gender Marcus Hatfield 77 y.o. male Room/Bed: TRAAC/TRAAC  Code Status   Code Status: Prior  Home/SNF/Other Home Patient oriented to: self, place, time, and situation Is this baseline? Yes   Triage Complete: Triage complete  Chief Complaint Acute blood loss anemia [D62]  Triage Note Pt BIB [facility] for AMS with LOC while on toilet this AM.  EMS endorses pt as diaphoretic with diminished or absent lung sounds on right.    Allergies Allergies  Allergen Reactions   Penicillins Hives    Has patient had a PCN reaction causing immediate rash, facial/tongue/throat swelling, SOB or lightheadedness with hypotension:Yes Has patient had a PCN reaction causing severe rash involving mucus membranes or skin necrosis: Yes Has patient had a PCN reaction that required hospitalization No Has patient had a PCN reaction occurring within the last 10 years: No If all of the above answers are "NO", then may proceed with Cephalosporin use.    Latex Rash   Tape Rash    adhesive    Level of Care/Admitting Diagnosis ED Disposition     ED Disposition  Admit   Condition  --   Comment  Hospital Area: [facility] [[id number]]  Level of Care: Progressive [102]  Admit to Progressive based on following criteria: MULTISYSTEM THREATS such as stable sepsis, metabolic/electrolyte imbalance with or without encephalopathy that is responding to early treatment.  May admit patient to [facility] or [facility] if equivalent level of care is available:: No  Covid Evaluation: Asymptomatic - no recent exposure (last 10 days) testing not required  Diagnosis: Acute blood loss anemia [161096]  Admitting Physician: Marcus Hatfield [[id number]]  Attending Physician: Marcus Hatfield [[id number]]  Certification:: I certify this patient will need inpatient services for at least 2 midnights  Estimated Length of Stay: 2           B Medical/Surgery History Past Medical History:  Diagnosis Date   Arthritis    CKD (chronic kidney disease) stage 3, GFR 30-59 ml/min (HCC) 20015   Creat 1.9   Hypertension    Multiple myeloma (HCC) 2016   [facility] heme [facility]   Prostate disorder 02/2017   Past Surgical History:  Procedure Laterality Date   EYE SURGERY Bilateral 01/30/2018   KNEE SURGERY     over ten years ago   NEPHRECTOMY Right 03/2015   ROBOTIC ASSITED PARTIAL NEPHRECTOMY Left 07/07/2021   Procedure: XI ROBOTIC ASSITED PARTIAL NEPHRECTOMY;  Surgeon: [name], MD;  Location: [facility];  Service: Urology;  Laterality: Left;  5 HRS   XI ROBOTIC ASSISTED SIMPLE PROSTATECTOMY N/A 07/07/2021   Procedure: XI ROBOTIC ASSISTED SIMPLE PROSTATECTOMY;  Surgeon: [name], MD;  Location: [facility];  Service: Urology;  Laterality: N/A;     A IV Location/Drains/Wounds Patient Lines/Drains/Airways Status     Active Line/Drains/Airways     Name Placement date Placement time Site Days   Peripheral IV 06/05/23 20 G Posterior;Right;Left Hand 06/05/23  0841  Hand  less than 1   Peripheral IV 06/05/23 20 G Anterior;Distal;Right;Upper Arm 06/05/23  0841  Arm  less than 1   Peripheral IV 06/05/23 20 G Anterior;Distal;Left;Upper Arm 06/05/23  0930  Arm  less than 1            Intake/Output Last 24 hours  Intake/Output Summary (Last 24 hours) at 06/05/2023 1206 Last data filed at 06/05/2023 1018 Gross per 24 hour  Intake 1000 ml  Output --  Net 1000 ml    Labs/Imaging Results for orders placed or performed during the hospital encounter of 06/05/23 (from the past 48 hour(s))  Resp panel by RT-PCR (RSV, Flu A&B, Covid) Anterior Nasal Swab     Status: None   Collection Time: 06/05/23  8:27 AM   Specimen: Anterior Nasal Swab  Result Value Ref Range   SARS Coronavirus 2 by RT PCR NEGATIVE NEGATIVE   Influenza A by PCR NEGATIVE NEGATIVE   Influenza B by PCR NEGATIVE NEGATIVE    Comment: (NOTE) The Xpert Xpress  SARS-CoV-2/FLU/RSV plus assay is intended as an aid in the diagnosis of influenza from Nasopharyngeal swab specimens and should not be used as a sole basis for treatment. Nasal washings and aspirates are unacceptable for Xpert Xpress SARS-CoV-2/FLU/RSV testing.  Fact Sheet for Patients: BloggerCourse.com  Fact Sheet for Healthcare Providers: SeriousBroker.it  This test is not yet approved or cleared by the [place] FDA and has been authorized for detection and/or diagnosis of SARS-CoV-2 by FDA under an Emergency Use Authorization (EUA). This EUA will remain in effect (meaning this test can be used) for the duration of the COVID-19 declaration under Section 564(b)(1) of the Act, 21 U.S.C. section 360bbb-3(b)(1), unless the authorization is terminated or revoked.     Resp Syncytial Virus by PCR NEGATIVE NEGATIVE    Comment: (NOTE) Fact Sheet for Patients: BloggerCourse.com  Fact Sheet for Healthcare Providers: SeriousBroker.it  This test is not yet approved or cleared by the [place] FDA and has been authorized for detection and/or diagnosis of SARS-CoV-2 by FDA under an Emergency Use Authorization (EUA). This EUA will remain in effect (meaning this test can be used) for the duration of the COVID-19 declaration under Section 564(b)(1) of the Act, 21 U.S.C. section 360bbb-3(b)(1), unless the authorization is terminated or revoked.  Performed at [facility], [address] [place], [place] [address]   Lactic acid, plasma     Status: Abnormal   Collection Time: 06/05/23  8:27 AM  Result Value Ref Range   Lactic Acid, Venous 7.0 (HH) 0.5 - 1.9 mmol/L    Comment: CRITICAL RESULT CALLED TO, READ BACK BY AND VERIFIED WITH J.Kenisha Lynds RN @0948  06.17.2024 E.AHMED Performed at [facility], [address], [place], [place] [address]   Comprehensive metabolic panel      Status: Abnormal   Collection Time: 06/05/23  8:27 AM  Result Value Ref Range   Sodium 132 (L) 135 - 145 mmol/L   Potassium 4.2 3.5 - 5.1 mmol/L   Chloride 105 98 - 111 mmol/L   CO2 11 (L) 22 - 32 mmol/L   Glucose, Bld 298 (H) 70 - 99 mg/dL    Comment: Glucose reference range applies only to samples taken after fasting for at least 8 hours.   BUN 75 (H) 8 - 23 mg/dL   Creatinine, Ser 8.29 (H) 0.61 - 1.24 mg/dL   Calcium 7.5 (L) 8.9 - 10.3 mg/dL   Total Protein 4.6 (L) 6.5 - 8.1 g/dL   Albumin 1.9 (L) 3.5 - 5.0 g/dL   AST 16 15 - 41 U/L   ALT 19 0 - 44 U/L   Alkaline Phosphatase 35 (L) 38 - 126 U/L   Total Bilirubin 0.5 0.3 - 1.2 mg/dL   GFR, Estimated 37 (L) >60 mL/min    Comment: (NOTE) Calculated using the CKD-EPI Creatinine Equation (2021)    Anion gap 16 (H) 5 - 15    Comment:  Performed at [facility], [address], [place], [place] [address]  CBC with Differential     Status: Abnormal   Collection Time: 06/05/23  8:27 AM  Result Value Ref Range   WBC 8.2 4.0 - 10.5 K/uL   RBC 2.05 (L) 4.22 - 5.81 MIL/uL   Hemoglobin 6.1 (LL) 13.0 - 17.0 g/dL    Comment: REPEATED TO VERIFY THIS CRITICAL RESULT HAS VERIFIED AND BEEN CALLED TO J. Aireona Torelli RN BY MARY ALAMANO ON 06 17 2024 AT 0947, AND HAS BEEN READ BACK.     HCT 19.6 (L) 39.0 - 52.0 %   MCV 95.6 80.0 - 100.0 fL   MCH 29.8 26.0 - 34.0 pg   MCHC 31.1 30.0 - 36.0 g/dL   RDW 60.4 (H) 54.0 - 98.1 %   Platelets 136 (L) 150 - 400 K/uL   nRBC 0.7 (H) 0.0 - 0.2 %   Neutrophils Relative % 76 %   Neutro Abs 6.3 1.7 - 7.7 K/uL   Lymphocytes Relative 14 %   Lymphs Abs 1.1 0.7 - 4.0 K/uL   Monocytes Relative 8 %   Monocytes Absolute 0.7 0.1 - 1.0 K/uL   Eosinophils Relative 0 %   Eosinophils Absolute 0.0 0.0 - 0.5 K/uL   Basophils Relative 0 %   Basophils Absolute 0.0 0.0 - 0.1 K/uL   Immature Granulocytes 2 %   Abs Immature Granulocytes 0.12 (H) 0.00 - 0.07 K/uL    Comment: Performed at [facility], [address] [place], [place] [address]  Protime-INR     Status: Abnormal   Collection Time: 06/05/23  8:27 AM  Result Value Ref Range   Prothrombin Time 16.6 (H) 11.4 - 15.2 seconds   INR 1.3 (H) 0.8 - 1.2    Comment: (NOTE) INR goal varies based on device and disease states. Performed at [facility], [address], [place], [place] [address]   APTT     Status: Abnormal   Collection Time: 06/05/23  8:27 AM  Result Value Ref Range   aPTT 21 (L) 24 - 36 seconds    Comment: Performed at [facility], [address], [place], [place] [address]  Troponin I (High Sensitivity)     Status: None   Collection Time: 06/05/23  8:27 AM  Result Value Ref Range   Troponin I (High Sensitivity) 17 <18 ng/L    Comment: (NOTE) Elevated high sensitivity troponin I (hsTnI) values and significant  changes across serial measurements may suggest ACS but many other  chronic and acute conditions are known to elevate hsTnI results.  Refer to the "Links" section for chest pain algorithms and additional  guidance. Performed at [facility], [address] [place], [place] [address]   CBG monitoring, ED     Status: Abnormal   Collection Time: 06/05/23  8:27 AM  Result Value Ref Range   Glucose-Capillary 210 (H) 70 - 99 mg/dL    Comment: Glucose reference range applies only to samples taken after fasting for at least 8 hours.  Ammonia     Status: None   Collection Time: 06/05/23  8:33 AM  Result Value Ref Range   Ammonia 16 9 - 35 umol/L    Comment: Performed at [facility], [address] [place], [place] [address]  I-Stat venous blood gas, ED ([facility])     Status: Abnormal   Collection Time: 06/05/23  8:50 AM  Result Value  Ref Range   pH, Ven 7.207 (L) 7.25 - 7.43   pCO2, Ven 27.1 (L) 44 - 60 mmHg   pO2, Ven 35 32 - 45 mmHg   Bicarbonate 10.8 (L) 20.0 - 28.0 mmol/L   TCO2 12 (L) 22 - 32 mmol/L   O2 Saturation 56 %   Acid-base deficit 16.0 (H) 0.0 - 2.0 mmol/L   Sodium 133  (L) 135 - 145 mmol/L   Potassium 4.3 3.5 - 5.1 mmol/L   Calcium, Ion 1.09 (L) 1.15 - 1.40 mmol/L   HCT 18.0 (L) 39.0 - 52.0 %   Hemoglobin 6.1 (LL) 13.0 - 17.0 g/dL   Sample type VENOUS    Comment NOTIFIED PHYSICIAN   I-Stat Chem 8, ED     Status: Abnormal   Collection Time: 06/05/23  8:51 AM  Result Value Ref Range   Sodium 132 (L) 135 - 145 mmol/L   Potassium 4.2 3.5 - 5.1 mmol/L   Chloride 107 98 - 111 mmol/L   BUN 84 (H) 8 - 23 mg/dL   Creatinine, Ser 1.61 (H) 0.61 - 1.24 mg/dL   Glucose, Bld 096 (H) 70 - 99 mg/dL    Comment: Glucose reference range applies only to samples taken after fasting for at least 8 hours.   Calcium, Ion 1.13 (L) 1.15 - 1.40 mmol/L   TCO2 12 (L) 22 - 32 mmol/L   Hemoglobin 5.8 (LL) 13.0 - 17.0 g/dL   HCT 04.5 (L) 40.9 - 81.1 %   Comment NOTIFIED PHYSICIAN   POC occult blood, ED     Status: Abnormal   Collection Time: 06/05/23  9:24 AM  Result Value Ref Range   Fecal Occult Bld POSITIVE (A) NEGATIVE  Type and screen [facility]     Status: None (Preliminary result)   Collection Time: 06/05/23  9:30 AM  Result Value Ref Range   ABO/RH(D) B POS    Antibody Screen NEG    Sample Expiration      06/08/2023,2359 Performed at [facility], [address] [place], [place] [address]    Unit Number [id number]    Blood Component Type RED CELLS,LR    Unit division 00    Status of Unit ALLOCATED    Transfusion Status OK TO TRANSFUSE    Crossmatch Result Compatible    Unit Number [id number]    Blood Component Type RED CELLS,LR    Unit division 00    Status of Unit ALLOCATED    Transfusion Status OK TO TRANSFUSE    Crossmatch Result Compatible   Prepare RBC (crossmatch)     Status: None   Collection Time: 06/05/23  9:30 AM  Result Value Ref Range   Order Confirmation      ORDER PROCESSED BY BLOOD BANK Performed at [facility], [address], [place], [place] [address]    CT Angio Chest PE W and/or Wo  Contrast  Result Date: 06/05/2023 CLINICAL DATA:  PE suspected, history of renal cell carcinoma and multiple myeloma * Tracking Code: BO * EXAM: CT ANGIOGRAPHY CHEST WITH CONTRAST TECHNIQUE: Multidetector CT imaging of the chest was performed using the standard protocol during bolus administration of intravenous contrast. Multiplanar CT image reconstructions and MIPs were obtained to evaluate the vascular anatomy. RADIATION DOSE REDUCTION: This exam was performed according to the departmental dose-optimization program which includes automated exposure control, adjustment of the mA and/or kV according to patient size and/or use of iterative reconstruction technique. CONTRAST:   OMNIPAQUE IOHEXOL 350 MG/ML SOLN COMPARISON:  CT chest abdomen pelvis, 05/21/2023 FINDINGS: Cardiovascular: Satisfactory opacification of the pulmonary arteries to the segmental level. No evidence of pulmonary embolism. Cardiomegaly. Enlargement of the main pulmonary artery measuring up to 4.0 cm in caliber. No pericardial effusion. Mediastinum/Nodes: No enlarged mediastinal, hilar, or axillary lymph nodes. Thyroid gland, trachea, and esophagus demonstrate no significant findings. Lungs/Pleura: Background of very fine centrilobular nodularity, most concentrated in the lung apices. Dependent bibasilar scarring or atelectasis. No pleural effusion or pneumothorax. Upper Abdomen: No acute abnormality. Musculoskeletal: No chest wall abnormality. No acute osseous findings. Numerous lytic lesions throughout the spine, most notably a large lesion of the T6 vertebral body (series 10, image 81). Unchanged subacute appearing fracture deformities of the bilateral clavicular heads (series 8, image 76). Review of the MIP images confirms the above findings. IMPRESSION: 1. Negative examination for pulmonary embolism. 2. Background of very fine centrilobular nodularity, most concentrated in the lung apices, most commonly seen in smoking-related  respiratory bronchiolitis. 3. Cardiomegaly. 4. Enlargement of the main pulmonary artery, as can be seen in pulmonary hypertension. 5. Numerous lytic lesions throughout the spine, most notably a large lesion of the T6 vertebral body. Findings are consistent with history of multiple myeloma. 6. Unchanged subacute appearing fracture deformities of the bilateral clavicular heads. Electronically Signed   By: Jearld Lesch M.D.   On: 06/05/2023 11:19   CT ANGIO GI BLEED  Result Date: 06/05/2023 CLINICAL DATA:  Acute GI bleeding, melena, hypotension, history of renal cell carcinoma and multiple myeloma * Tracking Code: BO * EXAM: CTA ABDOMEN AND PELVIS WITHOUT AND WITH CONTRAST TECHNIQUE: Multidetector CT imaging of the abdomen and pelvis was performed using the standard protocol during bolus administration of intravenous contrast. Multiplanar reconstructed images and MIPs were obtained and reviewed to evaluate the vascular anatomy. RADIATION DOSE REDUCTION: This exam was performed according to the departmental dose-optimization program which includes automated exposure control, adjustment of the mA and/or kV according to patient size and/or use of iterative reconstruction technique. CONTRAST:  OMNIPAQUE IOHEXOL 350 MG/ML SOLN COMPARISON:  CT chest abdomen pelvis, 05/21/2023 FINDINGS: VASCULAR Normal contour and caliber of the abdominal aorta. No evidence of aneurysm, dissection, or other acute aortic pathology. No significant atherosclerosis. Standard branching pattern of the abdominal aorta with solitary bilateral renal arteries. Review of the MIP images confirms the above findings. NON-VASCULAR Lower chest: Please see separately reported examination of the chest. Hepatobiliary: No solid liver abnormality is seen. Unchanged benign hemangioma of the central right lobe of the liver (series 6, image 60). Numerous additional small cysts or hemangiomata throughout the liver, unchanged. No gallstones, gallbladder  wall thickening, or biliary dilatation. Pancreas: Unchanged cystic lesion of the dorsal pancreatic tail measuring 1.3 x 1.2 cm (series 6, image 80). No pancreatic ductal dilatation or surrounding inflammatory changes. Spleen: Normal in size without significant abnormality. Adrenals/Urinary Tract: Normal left adrenal gland. Status post right nephrectomy and adrenalectomy. No suspicious soft tissue or contrast enhancement in the nephrectomy bed. Unchanged appearance of the posterior midportion and inferior pole of the left kidney status post partial nephrectomy. No suspicious contrast enhancement. Simple, benign left renal cortical cysts, for which no specific further follow-up or characterization is required bladder is unremarkable. Stomach/Bowel: Stomach is within normal limits. Appendix appears normal. Descending and sigmoid diverticulosis. Multiple hyperdense, inspissated descending and sigmoid diverticula on precontrast imaging, which are without evidence of postcontrast enhancement and unchanged compared to prior examination. No specific nidus of intraluminal contrast extravasation identified. Lymphatic: No  enlarged abdominal or pelvic lymph nodes. Reproductive: No mass or other significant abnormality. Other: Unchanged, large right lower quadrant hernia, possibly spigelian type, containing multiple nonobstructed loops of distal ileum and the cecum (series 6, image 150). Fat containing left inguinal hernia. No ascites. Musculoskeletal: No acute osseous findings. Unchanged lytic lesions throughout the vertebral bodies (series 13, image 67). IMPRESSION: 1. Descending and sigmoid diverticulosis. Multiple hyperdense, inspissated descending and sigmoid diverticula on precontrast imaging, which are without evidence of postcontrast enhancement and unchanged compared to prior examination. No specific nidus of intraluminal contrast extravasation identified to localize GI bleeding. 2. Status post right nephrectomy and  adrenalectomy. No suspicious soft tissue or contrast enhancement in the nephrectomy bed. 3. Unchanged appearance of the posterior midportion and inferior pole of the left kidney status post partial nephrectomy. No suspicious contrast enhancement. 4. Unchanged cystic lesion of the dorsal pancreatic tail measuring 1.3 x 1.2 cm, likely a small pseudocyst or side branch intraductal papillary mucinous neoplasm. As there is no observed increased risk of malignancy for such lesions smaller than 2 cm, no specific further follow-up or characterization is required; continued attention with oncologic surveillance imaging for this patient with multiple known malignancies. 5. Unchanged lytic lesions throughout the vertebral bodies, consistent with history of multiple myeloma. 6. Unchanged, large right lower quadrant hernia containing multiple nonobstructed loops of distal ileum and the cecum. Electronically Signed   By: Jearld Lesch M.D.   On: 06/05/2023 11:08   CT HEAD WO CONTRAST ( )  Result Date: 06/05/2023 CLINICAL DATA:  Mental status change, unknown cause EXAM: CT HEAD WITHOUT CONTRAST TECHNIQUE: Contiguous axial images were obtained from the base of the skull through the vertex without intravenous contrast. RADIATION DOSE REDUCTION: This exam was performed according to the departmental dose-optimization program which includes automated exposure control, adjustment of the mA and/or kV according to patient size and/or use of iterative reconstruction technique. COMPARISON:  MR Brain 05/20/23 FINDINGS: Brain: Redemonstrated is a large petroclival mass, as seen on prior brain MRI, not significantly changed in size when accounting for differences in technique. Redemonstrated are multiple bilateral lucent calvarial lesions, which also appear unchanged for differences in size. No hemorrhage. No extra-axial fluid collection. No CT evidence of an acute cortical infarct. No hydrocephalus. Vascular: No hyperdense vessel or  unexpected calcification. Skull: There are multiple lucent calvarial lesions which were present brain. Sinuses/Orbits: Moderate-to-large right-sided mastoid effusion. No middle ear effusion. Bilateral lens replacement. Bilateral maxillary, frontal anterior ethmoid air cells are clear. The posterior ethmoid air cells in the bilateral sphenoid sinuses are obliterated by a large mass in the petrous clival region. Other: None. IMPRESSION: 1. No acute intracranial abnormality. 2. Redemonstrated is a large petroclival mass, as seen on prior brain MRI, not significantly changed in size when accounting for differences in technique. 3. Redemonstrated are multiple bilateral lucent calvarial lesions, which also appear unchanged for differences in size. Electronically Signed   By: Lorenza Cambridge M.D.   On: 06/05/2023 10:31   DG Chest Port 1 View  Result Date: 06/05/2023 CLINICAL DATA:  Questionable sepsis EXAM: PORTABLE CHEST 1 VIEW COMPARISON:  Chest radiograph 05/06/2023, CT chest/abdomen/pelvis 05/21/2023 FINDINGS: The cardiomediastinal silhouette is stable and within normal limits, allowing for rightward patient rotation. There is no focal consolidation or pulmonary edema. There is no pleural effusion or pneumothorax There is no acute osseous abnormality. The previously seen pathologic right clavicle fracture is not well seen on the current study. Remote right rib fractures are again noted. IMPRESSION: No radiographic evidence  of acute cardiopulmonary process. Electronically Signed   By: Lesia Hausen M.D.   On: 06/05/2023 09:07    Pending Labs Unresulted Labs (From admission, onward)     Start     Ordered   06/05/23 0827  Lactic acid, plasma  (Septic presentation on arrival (screening labs, nursing and treatment orders for obvious sepsis))  Now then every 2 hours,   R (with STAT occurrences)      06/05/23 0826   06/05/23 0827  Blood Culture (routine x 2)  (Septic presentation on arrival (screening labs, nursing  and treatment orders for obvious sepsis))  BLOOD CULTURE X 2,   STAT      06/05/23 0826   06/05/23 0827  Urinalysis, w/ Reflex to Culture (Infection Suspected) -Urine, Clean Catch  (Septic presentation on arrival (screening labs, nursing and treatment orders for obvious sepsis))  Once,   URGENT       Question:  Specimen Source  Answer:  Urine, Clean Catch   06/05/23 0826   Pending  Hemoglobin and hematocrit, blood  Now then every 6 hours,   R      Pending            Vitals/Pain Today's Vitals   06/05/23 1115 06/05/23 1130 06/05/23 1145 06/05/23 1200  BP: 114/82 105/73 115/79 116/77  Pulse: 95 94 93 94  Resp: 16 12 11 14   Temp:      TempSrc:      SpO2: 100% 100% 100% 100%    Isolation Precautions No active isolations  Medications Medications  vancomycin (VANCOREADY) IVPB 2000 mg/400 mL (2,000 mg Intravenous New Bag/Given 06/05/23 1028)  pantoprozole (PROTONIX) 80 mg /NS 100 mL infusion (8 mg/hr Intravenous New Bag/Given 06/05/23 1031)  ceFEPIme (MAXIPIME) 2 g in sodium chloride 0.9 % 100 mL IVPB (has no administration in time range)  vancomycin (VANCOREADY) IVPB 1750 mg/350 mL (has no administration in time range)  lactated ringers bolus 1,000 mL (0 mLs Intravenous Stopped 06/05/23 1015)  metroNIDAZOLE (FLAGYL) IVPB 500 mg (500 mg Intravenous New Bag/Given 06/05/23 1029)  ceFEPIme (MAXIPIME) 2 g in sodium chloride 0.9 % 100 mL IVPB (2 g Intravenous New Bag/Given 06/05/23 1031)  pantoprazole (PROTONIX) 80 mg /NS 100 mL IVPB (0 mg Intravenous Stopped 06/05/23 1019)  iohexol (OMNIPAQUE) 350 MG/ML injection 125 mL (125 mLs Intravenous Contrast Given 06/05/23 1010)  lactated ringers bolus 1,000 mL (0 mLs Intravenous Stopped 06/05/23 1018)    Mobility walks     Focused Assessments    R Recommendations: See Admitting Provider Note  Report given to:   Additional Notes:

## 2023-06-05 NOTE — Consult Note (Signed)
   Consultation  Referring Provider: Dr. Lossing     Primary Care Physician:  Avva, Ravisankar, MD Primary Gastroenterologist: Unassigned       Reason for Consultation: Melena, acute blood loss anemia             HPI:   Marcus Hatfield is a 76 y.o. male with a past medical history as listed below including CKD stage III, multiple myeloma and others, who presented to the ER with melena and acute blood loss anemia.    Per chart review patient just received his final treatment from radiation oncology on 06/02/2023 for his multiple myeloma..    Today, patient is seen with his son and wife surrounding him.  His wife explains that on Saturday morning 06/03/2023 he woke up and had a "dark stool", and felt slightly diaphoretic, but this seemed to pass after his bowel movement.  Then on Sunday he had a normal regular day with even a normal-looking stool.  Then this morning woke up around 8 or 9:00 and had a another episode of diaphoresis and felt faint and did not even really make it to the toilet before having a large amount of black stool.  She then called the ambulance.  Since being here he has had no further bowel movements.  Denies any previous history of the same.  He does take a daily aspirin but is not on any other blood thinners.  She tells me he just finished his treatment for multiple myeloma.  No heartburn or reflux typically.  Denies NSAID use.  Associated symptoms include some nausea.    Denies fever, chills, weight loss or vomiting.  ER course: Hemoglobin 10.7 on 06/02/2023--> 6.1 at 850 this morning--> 5.8--> 2 units PRBCs ordered, BUN elevated at 84, creatinine 1.9; CT angio pending  GI history: Patient reports that he thinks he had a colonoscopy with Dr. Outlaw years ago.  Past Medical History:  Diagnosis Date   Arthritis    CKD (chronic kidney disease) stage 3, GFR 30-59 ml/min (HCC) 20015   Creat 1.9   Hypertension    Multiple myeloma (HCC) 2016   [facility] heme onc   Prostate disorder  02/2017    Past Surgical History:  Procedure Laterality Date   EYE SURGERY Bilateral 01/30/2018   KNEE SURGERY     over ten years ago   NEPHRECTOMY Right 03/2015   ROBOTIC ASSITED PARTIAL NEPHRECTOMY Left 07/07/2021   Procedure: XI ROBOTIC ASSITED PARTIAL NEPHRECTOMY;  Surgeon: Manny, Theodore, MD;  Location: [facility];  Service: Urology;  Laterality: Left;  5 HRS   XI ROBOTIC ASSISTED SIMPLE PROSTATECTOMY N/A 07/07/2021   Procedure: XI ROBOTIC ASSISTED SIMPLE PROSTATECTOMY;  Surgeon: Manny, Theodore, MD;  Location: [facility];  Service: Urology;  Laterality: N/A;    Family History  Problem Relation Age of Onset   Diabetes Father    Hypertension Brother     Social History   Tobacco Use   Smoking status: Never   Smokeless tobacco: Never  Vaping Use   Vaping Use: Never used  Substance Use Topics   Alcohol use: No   Drug use: No    Prior to Admission medications   Medication Sig Start Date End Date Taking? Authorizing Provider  acyclovir (ZOVIRAX) 400 MG tablet Take 1 tablet (400 mg total) by mouth 2 (two) times daily. 05/24/23   Kale, Gautam Kishore, MD  amLODipine (NORVASC) 10 MG tablet Take 10 mg by mouth daily. 12/28/15   [provider]  aspirin   EC 81 MG tablet Take 81 mg by mouth daily. 06/22/16   [provider]  carvedilol (COREG) 6.25 MG tablet Take 6.25 mg by mouth 2 (two) times daily with a meal. 09/08/22   [provider]  dexamethasone (DECADRON) 4 MG tablet Take 1 tablet (4 mg total) by mouth 2 (two) times daily with a meal. 05/23/23   Alekh, Kshitiz, MD  dexamethasone (DECADRON) 4 MG tablet Take 5 tablets (20 mg total) by mouth once a week. Take the day after darzalex faspro. Take with breakfast. 05/24/23   Kale, Gautam Kishore, MD  diclofenac Sodium (VOLTAREN) 1 % GEL Apply 2 g topically 4 (four) times daily. 05/16/23   [provider]  docusate sodium (COLACE) 100 MG capsule Take 1 capsule (100 mg total) by mouth daily as needed for mild  constipation. 05/23/23   Alekh, Kshitiz, MD  ferrous sulfate 325 (65 FE) MG tablet Take 1 tablet (325 mg total) by mouth 2 (two) times daily as needed (iron). 05/23/23 08/21/23  Alekh, Kshitiz, MD  finasteride (PROSCAR) 5 MG tablet Take 5 mg by mouth daily.    [provider]  fluticasone (FLONASE) 50 MCG/ACT nasal spray Place 2 sprays into both nostrils daily as needed for allergies. 02/19/21   [provider]  furosemide (LASIX) 20 MG tablet Take 20 mg by mouth daily.    [provider]  lactulose (CHRONULAC) 10 GM/15ML solution Take 10 g by mouth daily as needed for constipation. 12/07/16   [provider]  lenalidomide (REVLIMID) 10 MG capsule Take 1 capsule (10 mg total) by mouth daily. Take for 21 days on, 7 days off, repeat every 28 days. 06/02/23   Kale, Gautam Kishore, MD  ondansetron (ZOFRAN) 8 MG tablet Take 1 tablet (8 mg total) by mouth every 8 (eight) hours as needed for nausea or vomiting. 05/24/23   Kale, Gautam Kishore, MD  prochlorperazine (COMPAZINE) 10 MG tablet Take 1 tablet (10 mg total) by mouth every 6 (six) hours as needed for nausea or vomiting. 05/24/23   Kale, Gautam Kishore, MD  tamsulosin (FLOMAX) 0.4 MG CAPS capsule Take 1 capsule (0.4 mg total) by mouth daily after supper. 05/01/21   Haviland, Julie, MD    Current Facility-Administered Medications  Medication Dose Route Frequency Provider Last Rate Last Admin   ceFEPIme (MAXIPIME) 2 g in sodium chloride 0.9 % 100 mL IVPB  2 g Intravenous Once Lawsing, James, MD       ceFEPIme (MAXIPIME) 2 g in sodium chloride 0.9 % 100 mL IVPB  2 g Intravenous Once Edwards, Caitlin, RPH       metroNIDAZOLE (FLAGYL) IVPB 500 mg  500 mg Intravenous Once Lawsing, James, MD       pantoprazole (PROTONIX) 80 mg /NS 100 mL IVPB  80 mg Intravenous Once Lawsing, James, MD       pantoprozole (PROTONIX) 80 mg /NS 100 mL infusion  8 mg/hr Intravenous Continuous Lawsing, James, MD       vancomycin (VANCOREADY) IVPB 2000  mg/400 mL  2,000 mg Intravenous Once Edwards, Caitlin, RPH       Current Outpatient Medications  Medication Sig Dispense Refill   acyclovir (ZOVIRAX) 400 MG tablet Take 1 tablet (400 mg total) by mouth 2 (two) times daily. 60 tablet 11   amLODipine (NORVASC) 10 MG tablet Take 10 mg by mouth daily.     aspirin EC 81 MG tablet Take 81 mg by mouth daily.     carvedilol (COREG) 6.25 MG   tablet Take 6.25 mg by mouth 2 (two) times daily with a meal.     dexamethasone (DECADRON) 4 MG tablet Take 1 tablet (4 mg total) by mouth 2 (two) times daily with a meal. 60 tablet 0   dexamethasone (DECADRON) 4 MG tablet Take 5 tablets (20 mg total) by mouth once a week. Take the day after darzalex faspro. Take with breakfast. 20 tablet 11   diclofenac Sodium (VOLTAREN) 1 % GEL Apply 2 g topically 4 (four) times daily.     docusate sodium (COLACE) 100 MG capsule Take 1 capsule (100 mg total) by mouth daily as needed for mild constipation.     ferrous sulfate 325 (65 FE) MG tablet Take 1 tablet (325 mg total) by mouth 2 (two) times daily as needed (iron).     finasteride (PROSCAR) 5 MG tablet Take 5 mg by mouth daily.     fluticasone (FLONASE) 50 MCG/ACT nasal spray Place 2 sprays into both nostrils daily as needed for allergies.     furosemide (LASIX) 20 MG tablet Take 20 mg by mouth daily.     lactulose (CHRONULAC) 10 GM/15ML solution Take 10 g by mouth daily as needed for constipation.  5   lenalidomide (REVLIMID) 10 MG capsule Take 1 capsule (10 mg total) by mouth daily. Take for 21 days on, 7 days off, repeat every 28 days. 21 capsule 0   ondansetron (ZOFRAN) 8 MG tablet Take 1 tablet (8 mg total) by mouth every 8 (eight) hours as needed for nausea or vomiting. 30 tablet 1   prochlorperazine (COMPAZINE) 10 MG tablet Take 1 tablet (10 mg total) by mouth every 6 (six) hours as needed for nausea or vomiting. 30 tablet 1   tamsulosin (FLOMAX) 0.4 MG CAPS capsule Take 1 capsule (0.4 mg total) by mouth daily after  supper. 30 capsule 1    Allergies as of 06/05/2023 - Review Complete 05/21/2023  Allergen Reaction Noted   Penicillins Hives 04/16/2012   Latex Rash 03/09/2017   Tape Rash 10/08/2015     Review of Systems:    Constitutional: No weight loss, fever or chills Skin: No rash  Cardiovascular: No chest pain  Respiratory:+SOB this morning Gastrointestinal: See HPI and otherwise negative Genitourinary: No dysuria  Neurological: +dizziness this morning Musculoskeletal: No new muscle or joint pain Hematologic: No bruising Psychiatric: No history of depression or anxiety    Physical Exam:  Vital signs in last 24 hours: Pulse Rate:  [43] 43 (06/17 0840) Resp:  [21-33] 33 (06/17 0840) BP: (91-94)/(65-68) 91/68 (06/17 0840) SpO2:  [84 %] 84 % (06/17 0840)   General:   Pleasant AA male appears to be in NAD, Well developed, Well nourished, alert and cooperative Head:  Normocephalic and atraumatic. Eyes:   PEERL, EOMI. No icterus. Conjunctiva pink. Ears:  Normal auditory acuity. Neck:  Supple Throat: Oral cavity and pharynx without inflammation, swelling or lesion.  Lungs: Respirations even and unlabored. Lungs clear to auscultation bilaterally.   No wheezes, crackles, or rhonchi.  Heart: Normal S1, S2. No MRG. Regular rate and rhythm. No peripheral edema, cyanosis or pallor.  Abdomen:  Soft, nondistended, nontender. No rebound or guarding. Normal bowel sounds. No appreciable masses or hepatomegaly. Rectal: Per ER physician large gross melena exam, fecal occult positive Msk:  Symmetrical without gross deformities. Peripheral pulses intact.  Extremities:  Without edema, no deformity or joint abnormality.  Neurologic:  Alert and  oriented x4;  grossly normal neurologically.  Skin:   Dry and intact   without significant lesions or rashes. Psychiatric: Demonstrates good judgement and reason without abnormal affect or behaviors.   LAB RESULTS: Recent Labs    06/02/23 1036 06/05/23 0850  06/05/23 0851  WBC 9.5  --   --   HGB 10.7* 6.1* 5.8*  HCT 32.9* 18.0* 17.0*  PLT 228  --   --    BMET Recent Labs    06/02/23 1036 06/05/23 0850 06/05/23 0851  NA 132* 133* 132*  K 4.3 4.3 4.2  CL 102  --  107  CO2 25  --   --   GLUCOSE 102*  --  276*  BUN 35*  --  84*  CREATININE 1.40*  --  1.90*  CALCIUM 8.1*  --   --    LFT Recent Labs    06/02/23 1036  PROT 7.4  ALBUMIN 3.2*  AST 9*  ALT 23  ALKPHOS 61  BILITOT 0.4    STUDIES: DG Chest Port 1 View  Result Date: 06/05/2023 CLINICAL DATA:  Questionable sepsis EXAM: PORTABLE CHEST 1 VIEW COMPARISON:  Chest radiograph 05/06/2023, CT chest/abdomen/pelvis 05/21/2023 FINDINGS: The cardiomediastinal silhouette is stable and within normal limits, allowing for rightward patient rotation. There is no focal consolidation or pulmonary edema. There is no pleural effusion or pneumothorax There is no acute osseous abnormality. The previously seen pathologic right clavicle fracture is not well seen on the current study. Remote right rib fractures are again noted. IMPRESSION: No radiographic evidence of acute cardiopulmonary process. Electronically Signed   By: Peter  Noone M.D.   On: 06/05/2023 09:07      Impression / Plan:   Impression: 1.  GI bleed: With description of some melena  possibly on 06/03/2023 and then again this morning with a large amount, hemoglobin 5.8 at admission--> 10.7 at recheck-2 units PRBCs ordered but it does not look like they have been given yet; likely upper GI bleed 2.  Multiple myeloma: Just finished radiation oncology 06/02/2023  Plan: 1.  Continue to monitor hemoglobin and transfusion as needed less than 7 2.  Will plan for EGD tomorrow with Dr. Dorsey.  Did discuss risks, benefits, limitations and alternatives with patient and his family and they agreed to proceed. 3.  Patient should remain n.p.o. for now and certainly n.p.o. at midnight. 4.  Continue Protonix infusion for now  Thank you for your  kind consultation, we will continue to follow.  Tisha Cline Lynne Davien Malone  06/05/2023, 9:24 AM    

## 2023-06-05 NOTE — TOC Initial Note (Signed)
Transition of Care [facility]) - Initial/Assessment Note    Patient Details  Name: Marcus Hatfield MRN: [id number] Date of Birth: Mar 12, 1946  [facility]) CM/SW Contact:    Gordy Clement, RN Phone Number: 06/05/2023, 2:58 PM  Clinical Narrative:                  CM met with Patient bedside.  Wife was in the room. Patient states he is completely independent prior to DC. He has not had any Home Health services in the past, and does not own any DME. PCP is established as Chilton Greathouse, MD..   TOC will continue to follow patient for any additional discharge needs   Wife will transport home at DC       Expected Discharge Plan: Home/Self Care Barriers to Discharge: Continued Medical Work up   Patient Goals and CMS Choice Patient states their goals for this hospitalization and ongoing recovery are:: Return home CMS Medicare.gov Compare Post Acute Care list provided to::  (NONE) Choice offered to / list presented to : NA      Expected Discharge Plan and Services In-house Referral: NA Discharge Planning Services: NA Post Acute Care Choice:  (TBD) Living arrangements for the past 2 months: Single Family Home Expected Discharge Date: 06/07/23               DME Arranged: N/A (TBD  Patient doen't require any DME at home currently)         [facility] Arranged: NA (Needs TBD) HH Agency: NA        Prior Living Arrangements/Services Living arrangements for the past 2 months: Single Family Home Lives with:: Spouse Patient language and need for interpreter reviewed:: Yes        Need for Family Participation in Patient Care: No (Comment) Care giver support system in place?: Yes (comment) Current home services:  (NONE) Criminal Activity/Legal Involvement Pertinent to Current Situation/Hospitalization: No - Comment as needed  Activities of Daily Living Home Assistive Devices/Equipment: None ADL Screening (condition at time of admission) Patient's cognitive ability adequate to  safely complete daily activities?: Yes Is the patient deaf or have difficulty hearing?: No Does the patient have difficulty seeing, even when wearing glasses/contacts?: No Does the patient have difficulty concentrating, remembering, or making decisions?: No Patient able to express need for assistance with ADLs?: Yes Does the patient have difficulty dressing or bathing?: No Independently performs ADLs?: Yes (appropriate for developmental age) Does the patient have difficulty walking or climbing stairs?: No Weakness of Legs: None Weakness of Arms/Hands: None  Permission Sought/Granted Permission sought to share information with : Case Manager Permission granted to share information with : Yes, Verbal Permission Granted        Permission granted to share info w Relationship: CM     Emotional Assessment Appearance:: Appears stated age Attitude/Demeanor/Rapport: Gracious   Orientation: : Oriented to Self, Oriented to Place, Oriented to  Time, Oriented to Situation Alcohol / Substance Use: Not Applicable Psych Involvement: No (comment)  Admission diagnosis:  Melena [K92.1] Acute blood loss anemia [D62] AKI (acute kidney injury) (HCC) [N17.9] Hypotension, unspecified hypotension type [I95.9] Altered mental status, unspecified altered mental status type [R41.82] Gastrointestinal hemorrhage, unspecified gastrointestinal hemorrhage type [K92.2] Patient Active Problem List   Diagnosis Date Noted   Acute blood loss anemia 06/05/2023   Counseling regarding advance care planning and goals of care 05/24/2023   Brain mass 05/20/2023   Multiple myeloma in relapse (HCC) 05/20/2023   Pathologic fracture of  right clavicle 05/20/2023   Renal mass 07/07/2021   AKI (acute kidney injury) (HCC) 05/05/2021   Acute urinary retention 03/11/2017   Hyponatremia 03/11/2017   HTN (hypertension) 03/11/2017   CKD stage 3b, GFR 30-44 ml/min (HCC) 03/11/2017   External hemorrhoid, thrombosed 04/16/2012    PCP:  Chilton Greathouse, MD Pharmacy:   [id number] [place], [place] - [address] AT [facility] [address] [place] [address] Phone: [phone] Fax: [phone]  [place] [facility] [address] [place] [address] Phone: [phone] Fax: [phone]  [facility], [place] - [address] [place] [address] Phone: [phone] Fax: [phone]     Social Determinants of Health (SDOH) Social History: SDOH Screenings   Food Insecurity: Patient Declined (06/05/2023)  Housing: Patient Declined (06/05/2023)  Transportation Needs: Patient Declined (06/05/2023)  Utilities: Patient Declined (06/05/2023)  Tobacco Use: Low Risk  (06/05/2023)   SDOH Interventions:     Readmission Risk Interventions     No data to display

## 2023-06-05 NOTE — Progress Notes (Signed)
Pharmacy Antibiotic Note  Marcus Hatfield is a 77 y.o. male admitted on 06/05/2023 with PMH significant for multiple myeloma and CKD stage III.  Pharmacy has been consulted for vancomycin and cefepime dosing sepsis.   Scr 1.9 (baseline ~1.5), afebrile.   Plan: Vancomycin 2000 mg IV x1, followed by vancomycin IV 1750 mg q48h  Cefepime 2g IV q12h  Monitor clinical status, renal function, and cultures  F/U antibiotic de-escalation and length of therapy    No data recorded.  Recent Labs  Lab 06/02/23 1036 06/05/23 0851  WBC 9.5  --   CREATININE 1.40* 1.90*    Estimated Creatinine Clearance: 33.1 mL/min (A) (by C-G formula based on SCr of 1.9 mg/dL (H)).    Allergies  Allergen Reactions   Penicillins Hives    Has patient had a PCN reaction causing immediate rash, facial/tongue/throat swelling, SOB or lightheadedness with hypotension:Yes Has patient had a PCN reaction causing severe rash involving mucus membranes or skin necrosis: Yes Has patient had a PCN reaction that required hospitalization No Has patient had a PCN reaction occurring within the last 10 years: No If all of the above answers are "NO", then may proceed with Cephalosporin use.    Latex Rash   Tape Rash    adhesive    Antimicrobials this admission: 6/17 vancomycin >>  6/17 cefepime >>   Dose adjustments this admission: NA  Microbiology results: 6/17 BCx: sent  Thank you for allowing pharmacy to be a part of this patient's care.  Andreas Ohm, PharmD Pharmacy Resident  06/05/2023 9:42 AM

## 2023-06-05 NOTE — Sepsis Progress Note (Signed)
Sepsis protocol monitored by eLink ?

## 2023-06-05 NOTE — H&P (Signed)
History and Physical    Marcus Hatfield NWG:[id number] DOB: 12-Nov-1946 DOA: 06/05/2023  PCP: Chilton Greathouse, MD   Patient coming from: home via EMS Chief complaint: Loss of consciousness   HPI: 77 year old male with history of hypertension, CKD stage III, multiple myeloma chronic anemia brought to the ED for loss of consciousness in the toilet this morning along with diaphoresis diminished breath sounds per EMS and had melanotic stool Patient has history of anemia requiring blood transfusion in the past. In the ED was ill-appearing, diaphoretic.,  Vitals BP soft in 92/65 tachypnea 21-33, subsequently blood pressure as low as 73/44, afebrile, labs with metabolic acidosis pH 7.20 bicarb 12, BUN/creatinine 84/1.9 recently was 1.4-creatinine, her hemoglobin was 10.7 g on 6/14> in the ED 6.1 g on recheck 5.2 g lactic acid is 7.0 troponin 17 INR 1.3 blood glucose 298 chest x-ray no acute finding. CT angio GI bleed: Descending and sigmoid diverticulosis, right nephrectomy status and right adrenalectomy status, partial nephrectomy on left kidneys, cystic lesion unchanged on the pancreatic tail, likely lesion unchanged on vertebral bodies, right lower quadrant hernia unchanged. CT head large  petroclival mass, as seen on prior brain MRI, not significantly changed in size CT angio chest no PE possible smoking-related respiratory bronchiolitis, cardiomegaly, pulmonary hypertension like lesions throughout the spine. Sepsis protocol was initiated given 2 L bolus Ringer's lactate, PPI bolus and infusion, cefepime, Flagyl, vancomycin, blood culture sent, 2 units PRBC was ordered for transfusion Hemoccult was positive for stool. GI was consulted, hospitalist consulted for admission.  Currently feels well alert awake oriented x 3 moving all EXTR extremities not in distress wife and grandson at the bedside Reports they have seen GI doctor already.  Assessment/Plan Principal Problem:   Acute blood loss  anemia  Acute blood loss anemia Anemia of chronic disease Melanotic stool with Hemoccult positive concerning for acute GI bleeding: Patient admitted n.p.o. continue on PPI drip IV fluids antiemetics hold anticoagulants antiplatelets.  GI has been consulted.  Trend h/h, awaiting for 2 unit blood transfusion  Hypotension likely from GI bleeding: BP stable currently, pending 2 u blood transfusion, received IV fluids  Syncope: Most likely patient's hypotension and anemia contributed to syncope.  CT head CT angio GI bleed STEMI chest unremarkable/stable as above.  Monitor orthostatic once stable  Lactic acidosis likely from hypotension/anemia  AKI on CKD stage IIIa: Patient creatinine was 1.4- now at 1.9, continue IV hydration monitor  Multiple myeloma with lytics lesions followed by Dr. Candise Che he was due to be seen started on Revlimid ?/Chemo coming Tuesday but will be on hold .  Thrombocytopeia-likely from acute illness and myeloma.  Monitor  Hyponatremia-trend  History of hypertension holding meds  Severity of Illness: The appropriate patient status for this patient is INPATIENT. Inpatient status is judged to be reasonable and necessary in order to provide the required intensity of service to ensure the patient's safety. The patient's presenting symptoms, physical exam findings, and initial radiographic and laboratory data in the context of their chronic comorbidities is felt to place them at high risk for further clinical deterioration. Furthermore, it is not anticipated that the patient will be medically stable for discharge from the hospital within 2 midnights of admission.   * I certify that at the point of admission it is my clinical judgment that the patient will require inpatient hospital care spanning beyond 2 midnights from the point of admission due to high intensity of service, high risk for further deterioration and high frequency of surveillance required.*  DVT prophylaxis:  SCDs Start: 06/05/23 1209 Code Status:   Code Status: Full Code discussed with patient Family Communication: Admission, patients condition and plan of care including tests being ordered have been discussed with the patient and his wife and grandson who indicate understanding and agree with the plan and Code Status.  Consults called: GI   Review of Systems: All systems were reviewed and were negative except as mentioned in HPI above. Negative for fever Negative for chest pain Negative for shortness of breath  Past Medical History:  Diagnosis Date   Arthritis    CKD (chronic kidney disease) stage 3, GFR 30-59 ml/min (HCC) 20015   Creat 1.9   Hypertension    Multiple myeloma (HCC) 2016   [facility]   Prostate disorder 02/2017    Past Surgical History:  Procedure Laterality Date   EYE SURGERY Bilateral 01/30/2018   KNEE SURGERY     over ten years ago   NEPHRECTOMY Right 03/2015   ROBOTIC ASSITED PARTIAL NEPHRECTOMY Left 07/07/2021   Procedure: XI ROBOTIC ASSITED PARTIAL NEPHRECTOMY;  Surgeon: [name], MD;  Location: [facility];  Service: Urology;  Laterality: Left;  5 HRS   XI ROBOTIC ASSISTED SIMPLE PROSTATECTOMY N/A 07/07/2021   Procedure: XI ROBOTIC ASSISTED SIMPLE PROSTATECTOMY;  Surgeon: [name], MD;  Location: [facility];  Service: Urology;  Laterality: N/A;     reports that he has never smoked. He has never used smokeless tobacco. He reports that he does not drink alcohol and does not use drugs.  Allergies  Allergen Reactions   Penicillins Hives    Has patient had a PCN reaction causing immediate rash, facial/tongue/throat swelling, SOB or lightheadedness with hypotension:Yes Has patient had a PCN reaction causing severe rash involving mucus membranes or skin necrosis: Yes Has patient had a PCN reaction that required hospitalization No Has patient had a PCN reaction occurring within the last 10 years: No If all of the above answers are "NO", then may proceed  with Cephalosporin use.    Latex Rash   Tape Rash    adhesive    Family History  Problem Relation Age of Onset   Diabetes Father    Hypertension Brother      Prior to Admission medications   Medication Sig Start Date End Date Taking? Authorizing Provider  acyclovir (ZOVIRAX) 400 MG tablet Take 1 tablet (400 mg total) by mouth 2 (two) times daily. 05/24/23   [name], MD  amLODipine (NORVASC) 10 MG tablet Take 10 mg by mouth daily. 12/28/15   [provider]  aspirin EC 81 MG tablet Take 81 mg by mouth daily. 06/22/16   [provider]  carvedilol (COREG) 6.25 MG tablet Take 6.25 mg by mouth 2 (two) times daily with a meal. 09/08/22   [provider]  dexamethasone (DECADRON) 4 MG tablet Take 1 tablet (4 mg total) by mouth 2 (two) times daily with a meal. 05/23/23   [name], MD  dexamethasone (DECADRON) 4 MG tablet Take 5 tablets (20 mg total) by mouth once a week. Take the day after darzalex faspro. Take with breakfast. 05/24/23   [name], MD  diclofenac Sodium (VOLTAREN) 1 % GEL Apply 2 g topically 4 (four) times daily. 05/16/23   [provider]  docusate sodium (COLACE) 100 MG capsule Take 1 capsule (100 mg total) by mouth daily as needed for mild constipation. 05/23/23   [name], MD  ferrous sulfate 325 (65 FE) MG tablet Take  1 tablet (325 mg total) by mouth 2 (two) times daily as needed (iron). 05/23/23 08/21/23  [name], MD  finasteride (PROSCAR) 5 MG tablet Take 5 mg by mouth daily.    [provider]  fluticasone (FLONASE) 50 MCG/ACT nasal spray Place 2 sprays into both nostrils daily as needed for allergies. 02/19/21   [provider]  furosemide (LASIX) 20 MG tablet Take 20 mg by mouth daily.    [provider]  lactulose (CHRONULAC) 10 GM/15ML solution Take 10 g by mouth daily as needed for constipation. 12/07/16   [provider]  lenalidomide (REVLIMID) 10 MG capsule Take 1  capsule (10 mg total) by mouth daily. Take for 21 days on, 7 days off, repeat every 28 days. 06/05/23   [name], MD  ondansetron (ZOFRAN) 8 MG tablet Take 1 tablet (8 mg total) by mouth every 8 (eight) hours as needed for nausea or vomiting. 05/24/23   [name], MD  oxyCODONE (OXY IR/ROXICODONE) 5 MG immediate release tablet Take 5 mg by mouth every 8 (eight) hours as needed for moderate pain, severe pain or breakthrough pain. 05/25/23   [provider]  prochlorperazine (COMPAZINE) 10 MG tablet Take 1 tablet (10 mg total) by mouth every 6 (six) hours as needed for nausea or vomiting. 05/24/23   [name], MD  tamsulosin (FLOMAX) 0.4 MG CAPS capsule Take 1 capsule (0.4 mg total) by mouth daily after supper. 05/01/21   [name], MD    Physical Exam: Vitals:   06/05/23 1115 06/05/23 1130 06/05/23 1145 06/05/23 1200  BP: 114/82 105/73 115/79 116/77  Pulse: 95 94 93 94  Resp: 16 12 11 14   Temp:      TempSrc:      SpO2: 100% 100% 100% 100%    General exam: AAOx3 , NAD, weak appearing. HEENT:Oral mucosa moist, Ear/Nose WNL grossly, dentition normal. Respiratory system: bilaterally clear,no wheezing or crackles,no use of accessory muscle Cardiovascular system: S1 & S2 +, No JVD,. Gastrointestinal system: Abdomen soft, NT,ND, BS+ Nervous System:Alert, awake, moving extremities and grossly nonfocal Extremities: No edema, distal peripheral pulses palpable.  Skin: No rashes,no icterus. MSK: Normal muscle bulk,tone, power   Labs on Admission: I have personally reviewed following labs and imaging studies  CBC: Recent Labs  Lab 06/02/23 1036 06/05/23 0827 06/05/23 0850 06/05/23 0851  WBC 9.5 8.2  --   --   NEUTROABS 8.3* 6.3  --   --   HGB 10.7* 6.1* 6.1* 5.8*  HCT 32.9* 19.6* 18.0* 17.0*  MCV 91.4 95.6  --   --   PLT 228 136*  --   --    Basic Metabolic Panel: Recent Labs  Lab 06/02/23 1036 06/05/23 0827 06/05/23 0850 06/05/23 0851  NA  132* 132* 133* 132*  K 4.3 4.2 4.3 4.2  CL 102 105  --  107  CO2 25 11*  --   --   GLUCOSE 102* 298*  --  276*  BUN 35* 75*  --  84*  CREATININE 1.40* 1.86*  --  1.90*  CALCIUM 8.1* 7.5*  --   --    GFR: Estimated Creatinine Clearance: 33.1 mL/min (A) (by C-G formula based on SCr of 1.9 mg/dL (H)). Liver Function Tests: Recent Labs  Lab 06/02/23 1036 06/05/23 0827  AST 9* 16  ALT 23 19  ALKPHOS 61 35*  BILITOT 0.4 0.5  PROT 7.4 4.6*  ALBUMIN 3.2* 1.9*  No results for input(s): "LIPASE", "AMYLASE"  in the last 168 hours. Recent Labs  Lab 06/05/23 0833  AMMONIA 16  Coagulation Profile: Recent Labs  Lab 06/05/23 0827  INR 1.3*  CBG: Recent Labs  Lab 06/05/23 0827  GLUCAP 210*      Component Value Date/Time   COLORURINE RED (A) 04/17/2021 1447   APPEARANCEUR TURBID (A) 04/17/2021 1447   LABSPEC  04/17/2021 1447    TEST NOT REPORTED DUE TO COLOR INTERFERENCE OF URINE PIGMENT   PHURINE  04/17/2021 1447    TEST NOT REPORTED DUE TO COLOR INTERFERENCE OF URINE PIGMENT   GLUCOSEU (A) 04/17/2021 1447    TEST NOT REPORTED DUE TO COLOR INTERFERENCE OF URINE PIGMENT   HGBUR (A) 04/17/2021 1447    TEST NOT REPORTED DUE TO COLOR INTERFERENCE OF URINE PIGMENT   BILIRUBINUR (A) 04/17/2021 1447    TEST NOT REPORTED DUE TO COLOR INTERFERENCE OF URINE PIGMENT   KETONESUR (A) 04/17/2021 1447    TEST NOT REPORTED DUE TO COLOR INTERFERENCE OF URINE PIGMENT   PROTEINUR (A) 04/17/2021 1447    TEST NOT REPORTED DUE TO COLOR INTERFERENCE OF URINE PIGMENT   UROBILINOGEN >=8.0 04/16/2021 0932   NITRITE (A) 04/17/2021 1447    TEST NOT REPORTED DUE TO COLOR INTERFERENCE OF URINE PIGMENT   LEUKOCYTESUR (A) 04/17/2021 1447    TEST NOT REPORTED DUE TO COLOR INTERFERENCE OF URINE PIGMENT    Radiological Exams on Admission: CT Angio Chest PE W and/or Wo Contrast  Result Date: 06/05/2023 CLINICAL DATA:  PE suspected, history of renal cell carcinoma and multiple myeloma * Tracking Code: BO  * EXAM: CT ANGIOGRAPHY CHEST WITH CONTRAST TECHNIQUE: Multidetector CT imaging of the chest was performed using the standard protocol during bolus administration of intravenous contrast. Multiplanar CT image reconstructions and MIPs were obtained to evaluate the vascular anatomy. RADIATION DOSE REDUCTION: This exam was performed according to the departmental dose-optimization program which includes automated exposure control, adjustment of the mA and/or kV according to patient size and/or use of iterative reconstruction technique. CONTRAST:  OMNIPAQUE IOHEXOL 350 MG/ML SOLN COMPARISON:  CT chest abdomen pelvis, 05/21/2023 FINDINGS: Cardiovascular: Satisfactory opacification of the pulmonary arteries to the segmental level. No evidence of pulmonary embolism. Cardiomegaly. Enlargement of the main pulmonary artery measuring up to 4.0 cm in caliber. No pericardial effusion. Mediastinum/Nodes: No enlarged mediastinal, hilar, or axillary lymph nodes. Thyroid gland, trachea, and esophagus demonstrate no significant findings. Lungs/Pleura: Background of very fine centrilobular nodularity, most concentrated in the lung apices. Dependent bibasilar scarring or atelectasis. No pleural effusion or pneumothorax. Upper Abdomen: No acute abnormality. Musculoskeletal: No chest wall abnormality. No acute osseous findings. Numerous lytic lesions throughout the spine, most notably a large lesion of the T6 vertebral body (series 10, image 81). Unchanged subacute appearing fracture deformities of the bilateral clavicular heads (series 8, image 76). Review of the MIP images confirms the above findings. IMPRESSION: 1. Negative examination for pulmonary embolism. 2. Background of very fine centrilobular nodularity, most concentrated in the lung apices, most commonly seen in smoking-related respiratory bronchiolitis. 3. Cardiomegaly. 4. Enlargement of the main pulmonary artery, as can be seen in pulmonary hypertension. 5. Numerous  lytic lesions throughout the spine, most notably a large lesion of the T6 vertebral body. Findings are consistent with history of multiple myeloma. 6. Unchanged subacute appearing fracture deformities of the bilateral clavicular heads. Electronically Signed   By: Jearld Lesch M.D.   On: 06/05/2023 11:19   CT ANGIO GI BLEED  Result Date: 06/05/2023 CLINICAL DATA:  Acute GI bleeding, melena, hypotension, history of renal cell carcinoma and multiple myeloma * Tracking Code: BO * EXAM: CTA ABDOMEN AND PELVIS WITHOUT AND WITH CONTRAST TECHNIQUE: Multidetector CT imaging of the abdomen and pelvis was performed using the standard protocol during bolus administration of intravenous contrast. Multiplanar reconstructed images and MIPs were obtained and reviewed to evaluate the vascular anatomy. RADIATION DOSE REDUCTION: This exam was performed according to the departmental dose-optimization program which includes automated exposure control, adjustment of the mA and/or kV according to patient size and/or use of iterative reconstruction technique. CONTRAST:  OMNIPAQUE IOHEXOL 350 MG/ML SOLN COMPARISON:  CT chest abdomen pelvis, 05/21/2023 FINDINGS: VASCULAR Normal contour and caliber of the abdominal aorta. No evidence of aneurysm, dissection, or other acute aortic pathology. No significant atherosclerosis. Standard branching pattern of the abdominal aorta with solitary bilateral renal arteries. Review of the MIP images confirms the above findings. NON-VASCULAR Lower chest: Please see separately reported examination of the chest. Hepatobiliary: No solid liver abnormality is seen. Unchanged benign hemangioma of the central right lobe of the liver (series 6, image 60). Numerous additional small cysts or hemangiomata throughout the liver, unchanged. No gallstones, gallbladder wall thickening, or biliary dilatation. Pancreas: Unchanged cystic lesion of the dorsal pancreatic tail measuring 1.3 x 1.2 cm (series 6, image  80). No pancreatic ductal dilatation or surrounding inflammatory changes. Spleen: Normal in size without significant abnormality. Adrenals/Urinary Tract: Normal left adrenal gland. Status post right nephrectomy and adrenalectomy. No suspicious soft tissue or contrast enhancement in the nephrectomy bed. Unchanged appearance of the posterior midportion and inferior pole of the left kidney status post partial nephrectomy. No suspicious contrast enhancement. Simple, benign left renal cortical cysts, for which no specific further follow-up or characterization is required bladder is unremarkable. Stomach/Bowel: Stomach is within normal limits. Appendix appears normal. Descending and sigmoid diverticulosis. Multiple hyperdense, inspissated descending and sigmoid diverticula on precontrast imaging, which are without evidence of postcontrast enhancement and unchanged compared to prior examination. No specific nidus of intraluminal contrast extravasation identified. Lymphatic: No enlarged abdominal or pelvic lymph nodes. Reproductive: No mass or other significant abnormality. Other: Unchanged, large right lower quadrant hernia, possibly spigelian type, containing multiple nonobstructed loops of distal ileum and the cecum (series 6, image 150). Fat containing left inguinal hernia. No ascites. Musculoskeletal: No acute osseous findings. Unchanged lytic lesions throughout the vertebral bodies (series 13, image 67). IMPRESSION: 1. Descending and sigmoid diverticulosis. Multiple hyperdense, inspissated descending and sigmoid diverticula on precontrast imaging, which are without evidence of postcontrast enhancement and unchanged compared to prior examination. No specific nidus of intraluminal contrast extravasation identified to localize GI bleeding. 2. Status post right nephrectomy and adrenalectomy. No suspicious soft tissue or contrast enhancement in the nephrectomy bed. 3. Unchanged appearance of the posterior midportion and  inferior pole of the left kidney status post partial nephrectomy. No suspicious contrast enhancement. 4. Unchanged cystic lesion of the dorsal pancreatic tail measuring 1.3 x 1.2 cm, likely a small pseudocyst or side branch intraductal papillary mucinous neoplasm. As there is no observed increased risk of malignancy for such lesions smaller than 2 cm, no specific further follow-up or characterization is required; continued attention with oncologic surveillance imaging for this patient with multiple known malignancies. 5. Unchanged lytic lesions throughout the vertebral bodies, consistent with history of multiple myeloma. 6. Unchanged, large right lower quadrant hernia containing multiple nonobstructed loops of distal ileum and the cecum. Electronically Signed   By: Jearld Lesch M.D.   On: 06/05/2023 11:08  CT HEAD WO CONTRAST ( )  Result Date: 06/05/2023 CLINICAL DATA:  Mental status change, unknown cause EXAM: CT HEAD WITHOUT CONTRAST TECHNIQUE: Contiguous axial images were obtained from the base of the skull through the vertex without intravenous contrast. RADIATION DOSE REDUCTION: This exam was performed according to the departmental dose-optimization program which includes automated exposure control, adjustment of the mA and/or kV according to patient size and/or use of iterative reconstruction technique. COMPARISON:  MR Brain 05/20/23 FINDINGS: Brain: Redemonstrated is a large petroclival mass, as seen on prior brain MRI, not significantly changed in size when accounting for differences in technique. Redemonstrated are multiple bilateral lucent calvarial lesions, which also appear unchanged for differences in size. No hemorrhage. No extra-axial fluid collection. No CT evidence of an acute cortical infarct. No hydrocephalus. Vascular: No hyperdense vessel or unexpected calcification. Skull: There are multiple lucent calvarial lesions which were present brain. Sinuses/Orbits: Moderate-to-large right-sided  mastoid effusion. No middle ear effusion. Bilateral lens replacement. Bilateral maxillary, frontal anterior ethmoid air cells are clear. The posterior ethmoid air cells in the bilateral sphenoid sinuses are obliterated by a large mass in the petrous clival region. Other: None. IMPRESSION: 1. No acute intracranial abnormality. 2. Redemonstrated is a large petroclival mass, as seen on prior brain MRI, not significantly changed in size when accounting for differences in technique. 3. Redemonstrated are multiple bilateral lucent calvarial lesions, which also appear unchanged for differences in size. Electronically Signed   By: Lorenza Cambridge M.D.   On: 06/05/2023 10:31   DG Chest Port 1 View  Result Date: 06/05/2023 CLINICAL DATA:  Questionable sepsis EXAM: PORTABLE CHEST 1 VIEW COMPARISON:  Chest radiograph 05/06/2023, CT chest/abdomen/pelvis 05/21/2023 FINDINGS: The cardiomediastinal silhouette is stable and within normal limits, allowing for rightward patient rotation. There is no focal consolidation or pulmonary edema. There is no pleural effusion or pneumothorax There is no acute osseous abnormality. The previously seen pathologic right clavicle fracture is not well seen on the current study. Remote right rib fractures are again noted. IMPRESSION: No radiographic evidence of acute cardiopulmonary process. Electronically Signed   By: Lesia Hausen M.D.   On: 06/05/2023 09:07   [name] MD [facility]  If 7PM-7AM, please contact night-coverage [email or web address]  06/05/2023, 12:11 PM

## 2023-06-05 NOTE — ED Notes (Signed)
Pt noted to have large BM with black, loose stool.  PT cleaned and new brief and chux placed.

## 2023-06-05 NOTE — Progress Notes (Signed)
Patient admitted to room 16 from the ed. Patient came in with positive blood in stool and low hemoglobin. Patient had one unit of blood transfusing. Patient denies complaints. Patient is a/0 x4. MP shows NSR/STACH.

## 2023-06-05 NOTE — Hospital Course (Addendum)
77 year old male with history of hypertension, CKD stage III, multiple myeloma chronic anemia brought to the ED for loss of consciousness in the toilet this morning along with diaphoresis diminished breath sounds per EMS and had melanotic stool Patient has history of anemia requiring blood transfusion in the past. In the ED was ill-appearing, diaphoretic.,  Vitals BP soft in 92/65 tachypnea 21-33, subsequently blood pressure as low as 73/44, afebrile, labs with metabolic acidosis pH 7.20 bicarb 12, BUN/creatinine 84/1.9 recently was 1.4-creatinine, her hemoglobin was 10.7 g on 6/14> in the ED 6.1 g on recheck 5.2 g lactic acid is 7.0 troponin 17 INR 1.3 blood glucose 298 chest x-ray no acute finding. CT angio GI bleed: Descending and sigmoid diverticulosis, right nephrectomy status and right adrenalectomy status, partial nephrectomy on left kidneys, cystic lesion unchanged on the pancreatic tail, likely lesion unchanged on vertebral bodies, right lower quadrant hernia unchanged. CT head large  petroclival mass, as seen on prior brain MRI, not significantly changed in size CT angio chest no PE possible smoking-related respiratory bronchiolitis, cardiomegaly, pulmonary hypertension like lesions throughout the spine. Sepsis protocol was initiated given 2 L bolus Ringer's lactate, PPI bolus and infusion, cefepime, Flagyl, vancomycin, blood culture sent, 2 units PRBC was ordered for transfusion Hemoccult was positive for stool. GI was consulted, hospitalist consulted for admission.

## 2023-06-05 NOTE — H&P (View-Only) (Signed)
Consultation  Referring Provider: Dr. Florentina Addison     Primary Care Physician:  Chilton Greathouse, MD Primary Gastroenterologist: Gentry Fitz       Reason for Consultation: Melena, acute blood loss anemia             HPI:   Marcus Hatfield is a 77 y.o. male with a past medical history as listed below including CKD stage III, multiple myeloma and others, who presented to the ER with melena and acute blood loss anemia.    Per chart review patient just received his final treatment from radiation oncology on 06/02/2023 for his multiple myeloma..    Today, patient is seen with his son and wife surrounding him.  His wife explains that on Saturday morning 06/03/2023 he woke up and had a "dark stool", and felt slightly diaphoretic, but this seemed to pass after his bowel movement.  Then on Sunday he had a normal regular day with even a normal-looking stool.  Then this morning woke up around 8 or 9:00 and had a another episode of diaphoresis and felt faint and did not even really make it to the toilet before having a large amount of black stool.  She then called the ambulance.  Since being here he has had no further bowel movements.  Denies any previous history of the same.  He does take a daily aspirin but is not on any other blood thinners.  She tells me he just finished his treatment for multiple myeloma.  No heartburn or reflux typically.  Denies NSAID use.  Associated symptoms include some nausea.    Denies fever, chills, weight loss or vomiting.  ER course: Hemoglobin 10.7 on 06/02/2023--> 6.1 at 850 this morning--> 5.8--> 2 units PRBCs ordered, BUN elevated at 84, creatinine 1.9; CT angio pending  GI history: Patient reports that he thinks he had a colonoscopy with Dr. Dulce Sellar years ago.  Past Medical History:  Diagnosis Date   Arthritis    CKD (chronic kidney disease) stage 3, GFR 30-59 ml/min (HCC) 20015   Creat 1.9   Hypertension    Multiple myeloma (HCC) 2016   [facility] heme onc   Prostate disorder  02/2017    Past Surgical History:  Procedure Laterality Date   EYE SURGERY Bilateral 01/30/2018   KNEE SURGERY     over ten years ago   NEPHRECTOMY Right 03/2015   ROBOTIC ASSITED PARTIAL NEPHRECTOMY Left 07/07/2021   Procedure: XI ROBOTIC ASSITED PARTIAL NEPHRECTOMY;  Surgeon: [name], MD;  Location: [facility];  Service: Urology;  Laterality: Left;  5 HRS   XI ROBOTIC ASSISTED SIMPLE PROSTATECTOMY N/A 07/07/2021   Procedure: XI ROBOTIC ASSISTED SIMPLE PROSTATECTOMY;  Surgeon: [name], MD;  Location: [facility];  Service: Urology;  Laterality: N/A;    Family History  Problem Relation Age of Onset   Diabetes Father    Hypertension Brother     Social History   Tobacco Use   Smoking status: Never   Smokeless tobacco: Never  Vaping Use   Vaping Use: Never used  Substance Use Topics   Alcohol use: No   Drug use: No    Prior to Admission medications   Medication Sig Start Date End Date Taking? Authorizing Provider  acyclovir (ZOVIRAX) 400 MG tablet Take 1 tablet (400 mg total) by mouth 2 (two) times daily. 05/24/23   [name], MD  amLODipine (NORVASC) 10 MG tablet Take 10 mg by mouth daily. 12/28/15   [provider]  aspirin  EC 81 MG tablet Take 81 mg by mouth daily. 06/22/16   [provider]  carvedilol (COREG) 6.25 MG tablet Take 6.25 mg by mouth 2 (two) times daily with a meal. 09/08/22   [provider]  dexamethasone (DECADRON) 4 MG tablet Take 1 tablet (4 mg total) by mouth 2 (two) times daily with a meal. 05/23/23   [name], MD  dexamethasone (DECADRON) 4 MG tablet Take 5 tablets (20 mg total) by mouth once a week. Take the day after darzalex faspro. Take with breakfast. 05/24/23   [name], MD  diclofenac Sodium (VOLTAREN) 1 % GEL Apply 2 g topically 4 (four) times daily. 05/16/23   [provider]  docusate sodium (COLACE) 100 MG capsule Take 1 capsule (100 mg total) by mouth daily as needed for mild  constipation. 05/23/23   [name], MD  ferrous sulfate 325 (65 FE) MG tablet Take 1 tablet (325 mg total) by mouth 2 (two) times daily as needed (iron). 05/23/23 08/21/23  [name], MD  finasteride (PROSCAR) 5 MG tablet Take 5 mg by mouth daily.    [provider]  fluticasone (FLONASE) 50 MCG/ACT nasal spray Place 2 sprays into both nostrils daily as needed for allergies. 02/19/21   [provider]  furosemide (LASIX) 20 MG tablet Take 20 mg by mouth daily.    [provider]  lactulose (CHRONULAC) 10 GM/15ML solution Take 10 g by mouth daily as needed for constipation. 12/07/16   [provider]  lenalidomide (REVLIMID) 10 MG capsule Take 1 capsule (10 mg total) by mouth daily. Take for 21 days on, 7 days off, repeat every 28 days. 06/02/23   [name], MD  ondansetron (ZOFRAN) 8 MG tablet Take 1 tablet (8 mg total) by mouth every 8 (eight) hours as needed for nausea or vomiting. 05/24/23   [name], MD  prochlorperazine (COMPAZINE) 10 MG tablet Take 1 tablet (10 mg total) by mouth every 6 (six) hours as needed for nausea or vomiting. 05/24/23   [name], MD  tamsulosin (FLOMAX) 0.4 MG CAPS capsule Take 1 capsule (0.4 mg total) by mouth daily after supper. 05/01/21   [name], MD    Current Facility-Administered Medications  Medication Dose Route Frequency Provider Last Rate Last Admin   ceFEPIme (MAXIPIME) 2 g in sodium chloride 0.9 % 100 mL IVPB  2 g Intravenous Once Ernie Avena, MD       ceFEPIme (MAXIPIME) 2 g in sodium chloride 0.9 % 100 mL IVPB  2 g Intravenous Once Andreas Ohm, RPH       metroNIDAZOLE (FLAGYL) IVPB 500 mg  500 mg Intravenous Once Ernie Avena, MD       pantoprazole (PROTONIX) 80 mg /NS 100 mL IVPB  80 mg Intravenous Once Ernie Avena, MD       pantoprozole (PROTONIX) 80 mg /NS 100 mL infusion  8 mg/hr Intravenous Continuous Ernie Avena, MD       vancomycin (VANCOREADY) IVPB 2000  mg/400 mL  2,000 mg Intravenous Once Andreas Ohm, [facility]       Current Outpatient Medications  Medication Sig Dispense Refill   acyclovir (ZOVIRAX) 400 MG tablet Take 1 tablet (400 mg total) by mouth 2 (two) times daily. 60 tablet 11   amLODipine (NORVASC) 10 MG tablet Take 10 mg by mouth daily.     aspirin EC 81 MG tablet Take 81 mg by mouth daily.     carvedilol (COREG) 6.25 MG  tablet Take 6.25 mg by mouth 2 (two) times daily with a meal.     dexamethasone (DECADRON) 4 MG tablet Take 1 tablet (4 mg total) by mouth 2 (two) times daily with a meal. 60 tablet 0   dexamethasone (DECADRON) 4 MG tablet Take 5 tablets (20 mg total) by mouth once a week. Take the day after darzalex faspro. Take with breakfast. 20 tablet 11   diclofenac Sodium (VOLTAREN) 1 % GEL Apply 2 g topically 4 (four) times daily.     docusate sodium (COLACE) 100 MG capsule Take 1 capsule (100 mg total) by mouth daily as needed for mild constipation.     ferrous sulfate 325 (65 FE) MG tablet Take 1 tablet (325 mg total) by mouth 2 (two) times daily as needed (iron).     finasteride (PROSCAR) 5 MG tablet Take 5 mg by mouth daily.     fluticasone (FLONASE) 50 MCG/ACT nasal spray Place 2 sprays into both nostrils daily as needed for allergies.     furosemide (LASIX) 20 MG tablet Take 20 mg by mouth daily.     lactulose (CHRONULAC) 10 GM/15ML solution Take 10 g by mouth daily as needed for constipation.  5   lenalidomide (REVLIMID) 10 MG capsule Take 1 capsule (10 mg total) by mouth daily. Take for 21 days on, 7 days off, repeat every 28 days. 21 capsule 0   ondansetron (ZOFRAN) 8 MG tablet Take 1 tablet (8 mg total) by mouth every 8 (eight) hours as needed for nausea or vomiting. 30 tablet 1   prochlorperazine (COMPAZINE) 10 MG tablet Take 1 tablet (10 mg total) by mouth every 6 (six) hours as needed for nausea or vomiting. 30 tablet 1   tamsulosin (FLOMAX) 0.4 MG CAPS capsule Take 1 capsule (0.4 mg total) by mouth daily after  supper. 30 capsule 1    Allergies as of 06/05/2023 - Review Complete 05/21/2023  Allergen Reaction Noted   Penicillins Hives 04/16/2012   Latex Rash 03/09/2017   Tape Rash 10/08/2015     Review of Systems:    Constitutional: No weight loss, fever or chills Skin: No rash  Cardiovascular: No chest pain  Respiratory:+SOB this morning Gastrointestinal: See HPI and otherwise negative Genitourinary: No dysuria  Neurological: +dizziness this morning Musculoskeletal: No new muscle or joint pain Hematologic: No bruising Psychiatric: No history of depression or anxiety    Physical Exam:  Vital signs in last 24 hours: Pulse Rate:  [43] 43 (06/17 0840) Resp:  [21-33] 33 (06/17 0840) BP: (91-94)/(65-68) 91/68 (06/17 0840) SpO2:  [84 %] 84 % (06/17 0840)   General:   Pleasant AA male appears to be in NAD, Well developed, Well nourished, alert and cooperative Head:  Normocephalic and atraumatic. Eyes:   PEERL, EOMI. No icterus. Conjunctiva pink. Ears:  Normal auditory acuity. Neck:  Supple Throat: Oral cavity and pharynx without inflammation, swelling or lesion.  Lungs: Respirations even and unlabored. Lungs clear to auscultation bilaterally.   No wheezes, crackles, or rhonchi.  Heart: Normal S1, S2. No MRG. Regular rate and rhythm. No peripheral edema, cyanosis or pallor.  Abdomen:  Soft, nondistended, nontender. No rebound or guarding. Normal bowel sounds. No appreciable masses or hepatomegaly. Rectal: Per ER physician large gross melena exam, fecal occult positive Msk:  Symmetrical without gross deformities. Peripheral pulses intact.  Extremities:  Without edema, no deformity or joint abnormality.  Neurologic:  Alert and  oriented x4;  grossly normal neurologically.  Skin:   Dry and intact  without significant lesions or rashes. Psychiatric: Demonstrates good judgement and reason without abnormal affect or behaviors.   LAB RESULTS: Recent Labs    06/02/23 1036 06/05/23 0850  06/05/23 0851  WBC 9.5  --   --   HGB 10.7* 6.1* 5.8*  HCT 32.9* 18.0* 17.0*  PLT 228  --   --    BMET Recent Labs    06/02/23 1036 06/05/23 0850 06/05/23 0851  NA 132* 133* 132*  K 4.3 4.3 4.2  CL 102  --  107  CO2 25  --   --   GLUCOSE 102*  --  276*  BUN 35*  --  84*  CREATININE 1.40*  --  1.90*  CALCIUM 8.1*  --   --    LFT Recent Labs    06/02/23 1036  PROT 7.4  ALBUMIN 3.2*  AST 9*  ALT 23  ALKPHOS 61  BILITOT 0.4    STUDIES: DG Chest Port 1 View  Result Date: 06/05/2023 CLINICAL DATA:  Questionable sepsis EXAM: PORTABLE CHEST 1 VIEW COMPARISON:  Chest radiograph 05/06/2023, CT chest/abdomen/pelvis 05/21/2023 FINDINGS: The cardiomediastinal silhouette is stable and within normal limits, allowing for rightward patient rotation. There is no focal consolidation or pulmonary edema. There is no pleural effusion or pneumothorax There is no acute osseous abnormality. The previously seen pathologic right clavicle fracture is not well seen on the current study. Remote right rib fractures are again noted. IMPRESSION: No radiographic evidence of acute cardiopulmonary process. Electronically Signed   By: Lesia Hausen M.D.   On: 06/05/2023 09:07      Impression / Plan:   Impression: 1.  GI bleed: With description of some melena  possibly on 06/03/2023 and then again this morning with a large amount, hemoglobin 5.8 at admission--> 10.7 at recheck-2 units PRBCs ordered but it does not look like they have been given yet; likely upper GI bleed 2.  Multiple myeloma: Just finished radiation oncology 06/02/2023  Plan: 1.  Continue to monitor hemoglobin and transfusion as needed less than 7 2.  Will plan for EGD tomorrow with Dr. [name].  Did discuss risks, benefits, limitations and alternatives with patient and his family and they agreed to proceed. 3.  Patient should remain n.p.o. for now and certainly n.p.o. at midnight. 4.  Continue Protonix infusion for now  Thank you for your  kind consultation, we will continue to follow.  Violet Baldy [facility]  06/05/2023, 9:24 AM

## 2023-06-05 NOTE — ED Provider Notes (Addendum)
[place] EMERGENCY DEPARTMENT AT [facility] Provider Note   CSN: [id number] Arrival date & time: 06/05/23  4098     History  No chief complaint on file.   Marcus Hatfield is a 77 y.o. male.  HPI   77 year old with past medical history significant for CKD stage III, multiple myeloma presenting to the emergency department after an episode of syncope while in the toilet this morning.  EMS stated that the patient was diaphoretic.  There was some concern for diminished breath sounds on the right.  The additional history was provided by the patient's wife who states that he had otherwise been in his normal state of health, not febrile.  He has a history of anemia requiring blood transfusions in the past.  Unclear if he has been passing any bloody bowel movements.  Per EMS, the patient had melanotic stool.  Remainder of patient HPI is limited by acuity of the patient's condition on arrival.  Home Medications Prior to Admission medications   Medication Sig Start Date End Date Taking? Authorizing Provider  acyclovir (ZOVIRAX) 400 MG tablet Take 1 tablet (400 mg total) by mouth 2 (two) times daily. 05/24/23   [name], MD  amLODipine (NORVASC) 10 MG tablet Take 10 mg by mouth daily. 12/28/15   [provider]  aspirin EC 81 MG tablet Take 81 mg by mouth daily. 06/22/16   [provider]  carvedilol (COREG) 6.25 MG tablet Take 6.25 mg by mouth 2 (two) times daily with a meal. 09/08/22   [provider]  dexamethasone (DECADRON) 4 MG tablet Take 1 tablet (4 mg total) by mouth 2 (two) times daily with a meal. 05/23/23   [name], MD  dexamethasone (DECADRON) 4 MG tablet Take 5 tablets (20 mg total) by mouth once a week. Take the day after darzalex faspro. Take with breakfast. 05/24/23   [name], MD  diclofenac Sodium (VOLTAREN) 1 % GEL Apply 2 g topically 4 (four) times daily. 05/16/23   [provider]  docusate sodium (COLACE) 100 MG  capsule Take 1 capsule (100 mg total) by mouth daily as needed for mild constipation. 05/23/23   [name], MD  ferrous sulfate 325 (65 FE) MG tablet Take 1 tablet (325 mg total) by mouth 2 (two) times daily as needed (iron). 05/23/23 08/21/23  [name], MD  finasteride (PROSCAR) 5 MG tablet Take 5 mg by mouth daily.    [provider]  fluticasone (FLONASE) 50 MCG/ACT nasal spray Place 2 sprays into both nostrils daily as needed for allergies. 02/19/21   [provider]  furosemide (LASIX) 20 MG tablet Take 20 mg by mouth daily.    [provider]  lactulose (CHRONULAC) 10 GM/15ML solution Take 10 g by mouth daily as needed for constipation. 12/07/16   [provider]  lenalidomide (REVLIMID) 10 MG capsule Take 1 capsule (10 mg total) by mouth daily. Take for 21 days on, 7 days off, repeat every 28 days. 06/05/23   [name], MD  ondansetron (ZOFRAN) 8 MG tablet Take 1 tablet (8 mg total) by mouth every 8 (eight) hours as needed for nausea or vomiting. 05/24/23   [name], MD  oxyCODONE (OXY IR/ROXICODONE) 5 MG immediate release tablet Take 5 mg by mouth every 8 (eight) hours as needed for moderate pain, severe pain or breakthrough pain. 05/25/23   [provider]  prochlorperazine (COMPAZINE) 10 MG tablet Take 1 tablet (10 mg total) by  mouth every 6 (six) hours as needed for nausea or vomiting. 05/24/23   [name], MD  tamsulosin (FLOMAX) 0.4 MG CAPS capsule Take 1 capsule (0.4 mg total) by mouth daily after supper. 05/01/21   [name], MD      Allergies    Penicillins, Latex, and Tape    Review of Systems   Review of Systems  Unable to perform ROS: Acuity of condition    Physical Exam Updated Vital Signs BP 124/79   Pulse 98   Temp 97.8 F (36.6 C) (Oral)   Resp 14   SpO2 100%  Physical Exam Vitals and nursing note reviewed. Exam conducted with a chaperone present.  Constitutional:      Appearance: He  is ill-appearing and diaphoretic.     Comments: GCS 13 (3-4-6)  HENT:     Head: Normocephalic and atraumatic.  Eyes:     Conjunctiva/sclera: Conjunctivae normal.     Pupils: Pupils are equal, round, and reactive to light.  Cardiovascular:     Rate and Rhythm: Normal rate and regular rhythm.  Pulmonary:     Effort: Pulmonary effort is normal. No respiratory distress.  Abdominal:     General: There is no distension.     Tenderness: There is no guarding.  Genitourinary:    Comments: Large gross melena on exam, fecal occult positive  Musculoskeletal:        General: No deformity or signs of injury.     Cervical back: Neck supple.  Skin:    Findings: No lesion or rash.  Neurological:     General: No focal deficit present.     Mental Status: Mental status is at baseline.     ED Results / Procedures / Treatments   Labs (all labs ordered are listed, but only abnormal results are displayed) Labs Reviewed  LACTIC ACID, PLASMA - Abnormal; Notable for the following components:      Result Value   Lactic Acid, Venous 7.0 (*)    All other components within normal limits  COMPREHENSIVE METABOLIC PANEL - Abnormal; Notable for the following components:   Sodium 132 (*)    CO2 11 (*)    Glucose, Bld 298 (*)    BUN 75 (*)    Creatinine, Ser 1.86 (*)    Calcium 7.5 (*)    Total Protein 4.6 (*)    Albumin 1.9 (*)    Alkaline Phosphatase 35 (*)    GFR, Estimated 37 (*)    Anion gap 16 (*)    All other components within normal limits  CBC WITH DIFFERENTIAL/PLATELET - Abnormal; Notable for the following components:   RBC 2.05 (*)    Hemoglobin 6.1 (*)    HCT 19.6 (*)    RDW 17.4 (*)    Platelets 136 (*)    nRBC 0.7 (*)    Abs Immature Granulocytes 0.12 (*)    All other components within normal limits  PROTIME-INR - Abnormal; Notable for the following components:   Prothrombin Time 16.6 (*)    INR 1.3 (*)    All other components within normal limits  APTT - Abnormal; Notable for  the following components:   aPTT 21 (*)    All other components within normal limits  GLUCOSE, CAPILLARY - Abnormal; Notable for the following components:   Glucose-Capillary 110 (*)    All other components within normal limits  I-STAT CHEM 8, ED - Abnormal; Notable for the following components:   Sodium 132 (*)  BUN 84 (*)    Creatinine, Ser 1.90 (*)    Glucose, Bld 276 (*)    Calcium, Ion 1.13 (*)    TCO2 12 (*)    Hemoglobin 5.8 (*)    HCT 17.0 (*)    All other components within normal limits  I-STAT VENOUS BLOOD GAS, ED - Abnormal; Notable for the following components:   pH, Ven 7.207 (*)    pCO2, Ven 27.1 (*)    Bicarbonate 10.8 (*)    TCO2 12 (*)    Acid-base deficit 16.0 (*)    Sodium 133 (*)    Calcium, Ion 1.09 (*)    HCT 18.0 (*)    Hemoglobin 6.1 (*)    All other components within normal limits  CBG MONITORING, ED - Abnormal; Notable for the following components:   Glucose-Capillary 210 (*)    All other components within normal limits  POC OCCULT BLOOD, ED - Abnormal; Notable for the following components:   Fecal Occult Bld POSITIVE (*)    All other components within normal limits  RESP PANEL BY RT-PCR (RSV, FLU A&B, COVID)  RVPGX2  CULTURE, BLOOD (ROUTINE X 2)  CULTURE, BLOOD (ROUTINE X 2)  AMMONIA  URINALYSIS, W/ REFLEX TO CULTURE (INFECTION SUSPECTED)  HEMOGLOBIN AND HEMATOCRIT, BLOOD  HEMOGLOBIN AND HEMATOCRIT, BLOOD  LACTIC ACID, PLASMA  BASIC METABOLIC PANEL  HEMOGLOBIN A1C  TYPE AND SCREEN  PREPARE RBC (CROSSMATCH)  TROPONIN I (HIGH SENSITIVITY)  TROPONIN I (HIGH SENSITIVITY)    EKG EKG Interpretation  Date/Time:  Monday June 05 2023 08:34:56 EDT Ventricular Rate:  96 PR Interval:  142 QRS Duration: 116 QT Interval:  366 QTC Calculation: 462 R Axis:   -69 Text Interpretation: Sinus rhythm with Premature atrial complexes Left axis deviation Incomplete right bundle branch block Abnormal ECG When compared with ECG of 04-May-2021 12:26,  PREVIOUS ECG IS PRESENT Confirmed by Ernie Avena ([id number]) on 06/05/2023 8:56:18 AM  Radiology CT Angio Chest PE W and/or Wo Contrast  Result Date: 06/05/2023 CLINICAL DATA:  PE suspected, history of renal cell carcinoma and multiple myeloma * Tracking Code: BO * EXAM: CT ANGIOGRAPHY CHEST WITH CONTRAST TECHNIQUE: Multidetector CT imaging of the chest was performed using the standard protocol during bolus administration of intravenous contrast. Multiplanar CT image reconstructions and MIPs were obtained to evaluate the vascular anatomy. RADIATION DOSE REDUCTION: This exam was performed according to the departmental dose-optimization program which includes automated exposure control, adjustment of the mA and/or kV according to patient size and/or use of iterative reconstruction technique. CONTRAST:  OMNIPAQUE IOHEXOL 350 MG/ML SOLN COMPARISON:  CT chest abdomen pelvis, 05/21/2023 FINDINGS: Cardiovascular: Satisfactory opacification of the pulmonary arteries to the segmental level. No evidence of pulmonary embolism. Cardiomegaly. Enlargement of the main pulmonary artery measuring up to 4.0 cm in caliber. No pericardial effusion. Mediastinum/Nodes: No enlarged mediastinal, hilar, or axillary lymph nodes. Thyroid gland, trachea, and esophagus demonstrate no significant findings. Lungs/Pleura: Background of very fine centrilobular nodularity, most concentrated in the lung apices. Dependent bibasilar scarring or atelectasis. No pleural effusion or pneumothorax. Upper Abdomen: No acute abnormality. Musculoskeletal: No chest wall abnormality. No acute osseous findings. Numerous lytic lesions throughout the spine, most notably a large lesion of the T6 vertebral body (series 10, image 81). Unchanged subacute appearing fracture deformities of the bilateral clavicular heads (series 8, image 76). Review of the MIP images confirms the above findings. IMPRESSION: 1. Negative examination for pulmonary embolism. 2.  Background of very fine centrilobular nodularity, most concentrated  in the lung apices, most commonly seen in smoking-related respiratory bronchiolitis. 3. Cardiomegaly. 4. Enlargement of the main pulmonary artery, as can be seen in pulmonary hypertension. 5. Numerous lytic lesions throughout the spine, most notably a large lesion of the T6 vertebral body. Findings are consistent with history of multiple myeloma. 6. Unchanged subacute appearing fracture deformities of the bilateral clavicular heads. Electronically Signed   By: Jearld Lesch M.D.   On: 06/05/2023 11:19   CT ANGIO GI BLEED  Result Date: 06/05/2023 CLINICAL DATA:  Acute GI bleeding, melena, hypotension, history of renal cell carcinoma and multiple myeloma * Tracking Code: BO * EXAM: CTA ABDOMEN AND PELVIS WITHOUT AND WITH CONTRAST TECHNIQUE: Multidetector CT imaging of the abdomen and pelvis was performed using the standard protocol during bolus administration of intravenous contrast. Multiplanar reconstructed images and MIPs were obtained and reviewed to evaluate the vascular anatomy. RADIATION DOSE REDUCTION: This exam was performed according to the departmental dose-optimization program which includes automated exposure control, adjustment of the mA and/or kV according to patient size and/or use of iterative reconstruction technique. CONTRAST:  OMNIPAQUE IOHEXOL 350 MG/ML SOLN COMPARISON:  CT chest abdomen pelvis, 05/21/2023 FINDINGS: VASCULAR Normal contour and caliber of the abdominal aorta. No evidence of aneurysm, dissection, or other acute aortic pathology. No significant atherosclerosis. Standard branching pattern of the abdominal aorta with solitary bilateral renal arteries. Review of the MIP images confirms the above findings. NON-VASCULAR Lower chest: Please see separately reported examination of the chest. Hepatobiliary: No solid liver abnormality is seen. Unchanged benign hemangioma of the central right lobe of the liver (series  6, image 60). Numerous additional small cysts or hemangiomata throughout the liver, unchanged. No gallstones, gallbladder wall thickening, or biliary dilatation. Pancreas: Unchanged cystic lesion of the dorsal pancreatic tail measuring 1.3 x 1.2 cm (series 6, image 80). No pancreatic ductal dilatation or surrounding inflammatory changes. Spleen: Normal in size without significant abnormality. Adrenals/Urinary Tract: Normal left adrenal gland. Status post right nephrectomy and adrenalectomy. No suspicious soft tissue or contrast enhancement in the nephrectomy bed. Unchanged appearance of the posterior midportion and inferior pole of the left kidney status post partial nephrectomy. No suspicious contrast enhancement. Simple, benign left renal cortical cysts, for which no specific further follow-up or characterization is required bladder is unremarkable. Stomach/Bowel: Stomach is within normal limits. Appendix appears normal. Descending and sigmoid diverticulosis. Multiple hyperdense, inspissated descending and sigmoid diverticula on precontrast imaging, which are without evidence of postcontrast enhancement and unchanged compared to prior examination. No specific nidus of intraluminal contrast extravasation identified. Lymphatic: No enlarged abdominal or pelvic lymph nodes. Reproductive: No mass or other significant abnormality. Other: Unchanged, large right lower quadrant hernia, possibly spigelian type, containing multiple nonobstructed loops of distal ileum and the cecum (series 6, image 150). Fat containing left inguinal hernia. No ascites. Musculoskeletal: No acute osseous findings. Unchanged lytic lesions throughout the vertebral bodies (series 13, image 67). IMPRESSION: 1. Descending and sigmoid diverticulosis. Multiple hyperdense, inspissated descending and sigmoid diverticula on precontrast imaging, which are without evidence of postcontrast enhancement and unchanged compared to prior examination. No specific  nidus of intraluminal contrast extravasation identified to localize GI bleeding. 2. Status post right nephrectomy and adrenalectomy. No suspicious soft tissue or contrast enhancement in the nephrectomy bed. 3. Unchanged appearance of the posterior midportion and inferior pole of the left kidney status post partial nephrectomy. No suspicious contrast enhancement. 4. Unchanged cystic lesion of the dorsal pancreatic tail measuring 1.3 x 1.2 cm, likely a small pseudocyst or  side branch intraductal papillary mucinous neoplasm. As there is no observed increased risk of malignancy for such lesions smaller than 2 cm, no specific further follow-up or characterization is required; continued attention with oncologic surveillance imaging for this patient with multiple known malignancies. 5. Unchanged lytic lesions throughout the vertebral bodies, consistent with history of multiple myeloma. 6. Unchanged, large right lower quadrant hernia containing multiple nonobstructed loops of distal ileum and the cecum. Electronically Signed   By: Jearld Lesch M.D.   On: 06/05/2023 11:08   CT HEAD WO CONTRAST ( )  Result Date: 06/05/2023 CLINICAL DATA:  Mental status change, unknown cause EXAM: CT HEAD WITHOUT CONTRAST TECHNIQUE: Contiguous axial images were obtained from the base of the skull through the vertex without intravenous contrast. RADIATION DOSE REDUCTION: This exam was performed according to the departmental dose-optimization program which includes automated exposure control, adjustment of the mA and/or kV according to patient size and/or use of iterative reconstruction technique. COMPARISON:  MR Brain 05/20/23 FINDINGS: Brain: Redemonstrated is a large petroclival mass, as seen on prior brain MRI, not significantly changed in size when accounting for differences in technique. Redemonstrated are multiple bilateral lucent calvarial lesions, which also appear unchanged for differences in size. No hemorrhage. No extra-axial  fluid collection. No CT evidence of an acute cortical infarct. No hydrocephalus. Vascular: No hyperdense vessel or unexpected calcification. Skull: There are multiple lucent calvarial lesions which were present brain. Sinuses/Orbits: Moderate-to-large right-sided mastoid effusion. No middle ear effusion. Bilateral lens replacement. Bilateral maxillary, frontal anterior ethmoid air cells are clear. The posterior ethmoid air cells in the bilateral sphenoid sinuses are obliterated by a large mass in the petrous clival region. Other: None. IMPRESSION: 1. No acute intracranial abnormality. 2. Redemonstrated is a large petroclival mass, as seen on prior brain MRI, not significantly changed in size when accounting for differences in technique. 3. Redemonstrated are multiple bilateral lucent calvarial lesions, which also appear unchanged for differences in size. Electronically Signed   By: Lorenza Cambridge M.D.   On: 06/05/2023 10:31   DG Chest Port 1 View  Result Date: 06/05/2023 CLINICAL DATA:  Questionable sepsis EXAM: PORTABLE CHEST 1 VIEW COMPARISON:  Chest radiograph 05/06/2023, CT chest/abdomen/pelvis 05/21/2023 FINDINGS: The cardiomediastinal silhouette is stable and within normal limits, allowing for rightward patient rotation. There is no focal consolidation or pulmonary edema. There is no pleural effusion or pneumothorax There is no acute osseous abnormality. The previously seen pathologic right clavicle fracture is not well seen on the current study. Remote right rib fractures are again noted. IMPRESSION: No radiographic evidence of acute cardiopulmonary process. Electronically Signed   By: Lesia Hausen M.D.   On: 06/05/2023 09:07    Procedures .Critical Care  Performed by: Ernie Avena, MD Authorized by: Ernie Avena, MD   Critical care provider statement:    Critical care time (minutes):  84   Critical care was necessary to treat or prevent imminent or life-threatening deterioration of the  following conditions:  Shock   Critical care was time spent personally by me on the following activities:  Development of treatment plan with patient or surrogate, discussions with consultants, evaluation of patient's response to treatment, examination of patient, ordering and review of laboratory studies, ordering and review of radiographic studies, ordering and performing treatments and interventions, pulse oximetry, re-evaluation of patient's condition and review of old charts   Care discussed with: admitting provider       Medications Ordered in ED Medications  pantoprozole (PROTONIX) 80 mg /NS 100 mL  infusion (8 mg/hr Intravenous New Bag/Given 06/05/23 1031)  ceFEPIme (MAXIPIME) 2 g in sodium chloride 0.9 % 100 mL IVPB (has no administration in time range)  vancomycin (VANCOREADY) IVPB 1750 mg/350 mL (has no administration in time range)  0.9 %  sodium chloride infusion (has no administration in time range)  0.9 %  sodium chloride infusion (has no administration in time range)  acetaminophen (TYLENOL) tablet 650 mg (has no administration in time range)    Or  acetaminophen (TYLENOL) suppository 650 mg (has no administration in time range)  ondansetron (ZOFRAN) tablet 4 mg (has no administration in time range)    Or  ondansetron (ZOFRAN) injection 4 mg (has no administration in time range)  insulin aspart (novoLOG) injection 0-15 Units (0 Units Subcutaneous Not Given 06/05/23 1418)  lactated ringers bolus 1,000 mL (0 mLs Intravenous Stopped 06/05/23 1015)  metroNIDAZOLE (FLAGYL) IVPB 500 mg (0 mg Intravenous Stopped 06/05/23 1145)  vancomycin (VANCOREADY) IVPB 2000 mg/400 mL (0 mg Intravenous Stopped 06/05/23 1228)  ceFEPIme (MAXIPIME) 2 g in sodium chloride 0.9 % 100 mL IVPB (0 g Intravenous Stopped 06/05/23 1145)  pantoprazole (PROTONIX) 80 mg /NS 100 mL IVPB (0 mg Intravenous Stopped 06/05/23 1019)  iohexol (OMNIPAQUE) 350 MG/ML injection 125 mL (125 mLs Intravenous Contrast Given 06/05/23  1010)  lactated ringers bolus 1,000 mL (0 mLs Intravenous Stopped 06/05/23 1018)    ED Course/ Medical Decision Making/ A&P Clinical Course as of 06/05/23 1450  Mon Jun 05, 2023  0856 pH, Ven(!): 7.207 [JL]  0920 Hemoglobin(!!): 5.8 [JL]  1155 CT ANGIO GI BLEED [JL]    Clinical Course User Index [JL] Ernie Avena, MD                             Medical Decision Making Amount and/or Complexity of Data Reviewed Labs: ordered. Decision-making details documented in ED Course. Radiology: ordered. Decision-making details documented in ED Course. ECG/medicine tests: ordered.  Risk Prescription drug management. Decision regarding hospitalization.     77 year old with past medical history significant for CKD stage III, multiple myeloma presenting to the emergency department after an episode of syncope while in the toilet this morning.  EMS stated that the patient was diaphoretic.  There was some concern for diminished breath sounds on the right.  The additional history was provided by the patient's wife who states that he had otherwise been in his normal state of health, not febrile.  He has a history of anemia requiring blood transfusions in the past.  Unclear if he has been passing any bloody bowel movements.  Per EMS, the patient had melanotic stool.  Remainder of patient HPI is limited by acuity of the patient's condition on arrival.  On arrival, the patient was afebrile, temperature 97, heart rate in the 90s to low 100s, mildly tachypneic RR in the 20s, BP 92/65, subsequently decreased to as low as 70/49.  IV access was obtained and the patient was fluid resuscitated with 2 L of LR.  Concern for GI bleed.  The patient was started on IV Protonix.  Initial i-STAT revealed hemoglobin of 5.8.  The patient's blood pressure improved with fluid resuscitation and he was administered and transfused 2 units PRBCs.  He was covered for sepsis with broad-spectrum antibiotics and additional workup was  initiated.  His mental status improved with improvement in his blood pressure.  He has no neurologic deficits on exam to suggest stroke.  Per family he had  been overall well-appearing up until passage of a possible melanotic bowel movement on Saturday and then also this morning.  No infectious symptoms reported by family.  Initial CBG of 210 on arrival.  Initial lactic acid was elevated to 7.0.  CBC revealed an anemia to 6.1, WBC with no leukocytosis, CMP without an AKI with a creatinine roughly at baseline at 1.86, anion gap acidosis with a bicarbonate of 11, anion gap of 16, suspect likely due to the patient's lactic acidosis.  Patient's blood pressure improved following fluid and blood product administration. He was covered for possible sepsis with broad spectrum antibiotics. Blood cultures were obtained prior to antibiotic administration. Given the concern for ongoing bleeding from the GI tract, the use of CT with contrast is indicated.  CT Head: IMPRESSION:  1. No acute intracranial abnormality.  2. Redemonstrated is a large petroclival mass, as seen on prior  brain MRI, not significantly changed in size when accounting for  differences in technique.  3. Redemonstrated are multiple bilateral lucent calvarial lesions,  which also appear unchanged for differences in size.    CTA PE: IMPRESSION:  1. Negative examination for pulmonary embolism.  2. Background of very fine centrilobular nodularity, most  concentrated in the lung apices, most commonly seen in  smoking-related respiratory bronchiolitis.  3. Cardiomegaly.  4. Enlargement of the main pulmonary artery, as can be seen in  pulmonary hypertension.  5. Numerous lytic lesions throughout the spine, most notably a large  lesion of the T6 vertebral body. Findings are consistent with  history of multiple myeloma.  6. Unchanged subacute appearing fracture deformities of the  bilateral clavicular heads.    CTA GI Bleed: IMPRESSION:  1.  Descending and sigmoid diverticulosis. Multiple hyperdense,  inspissated descending and sigmoid diverticula on precontrast  imaging, which are without evidence of postcontrast enhancement and  unchanged compared to prior examination. No specific nidus of  intraluminal contrast extravasation identified to localize GI  bleeding.  2. Status post right nephrectomy and adrenalectomy. No suspicious  soft tissue or contrast enhancement in the nephrectomy bed.  3. Unchanged appearance of the posterior midportion and inferior  pole of the left kidney status post partial nephrectomy. No  suspicious contrast enhancement.  4. Unchanged cystic lesion of the dorsal pancreatic tail measuring  1.3 x 1.2 cm, likely a small pseudocyst or side branch intraductal  papillary mucinous neoplasm. As there is no observed increased risk  of malignancy for such lesions smaller than 2 cm, no specific  further follow-up or characterization is required; continued  attention with oncologic surveillance imaging for this patient with  multiple known malignancies.  5. Unchanged lytic lesions throughout the vertebral bodies,  consistent with history of multiple myeloma.  6. Unchanged, large right lower quadrant hernia containing multiple  nonobstructed loops of distal ileum and the cecum.   [facility] was consulted for further management, spoke with Hyacinth Meeker, PA. The patient's blood pressure improved over repeat measurements and remained hemodynamically stable. Hospitalist medicine consulted for admission to progressive, Dr. Dayna Barker accepting.   Final Clinical Impression(s) / ED Diagnoses Final diagnoses:  Hypotension, unspecified hypotension type  Gastrointestinal hemorrhage, unspecified gastrointestinal hemorrhage type  Melena  Altered mental status, unspecified altered mental status type  AKI (acute kidney injury) [facility])    Rx / DC Orders ED Discharge Orders     None         Ernie Avena, MD 06/05/23 1449    Ernie Avena, MD 06/05/23 1450  Ernie Avena, MD 06/05/23 2021

## 2023-06-06 ENCOUNTER — Inpatient Hospital Stay (HOSPITAL_COMMUNITY): Payer: Medicare HMO | Admitting: Anesthesiology

## 2023-06-06 ENCOUNTER — Encounter (HOSPITAL_COMMUNITY): Payer: Self-pay | Admitting: Internal Medicine

## 2023-06-06 ENCOUNTER — Inpatient Hospital Stay: Payer: Medicare HMO

## 2023-06-06 ENCOUNTER — Encounter (HOSPITAL_COMMUNITY)
Admission: EM | Disposition: A | Discharge status: Home / Self Care | Payer: Self-pay | Source: Home / Self Care | Attending: Emergency Medicine

## 2023-06-06 DIAGNOSIS — K2970 Gastritis, unspecified, without bleeding: Secondary | ICD-10-CM

## 2023-06-06 DIAGNOSIS — N179 Acute kidney failure, unspecified: Secondary | ICD-10-CM | POA: Diagnosis not present

## 2023-06-06 DIAGNOSIS — K317 Polyp of stomach and duodenum: Secondary | ICD-10-CM | POA: Diagnosis not present

## 2023-06-06 DIAGNOSIS — Z7189 Other specified counseling: Secondary | ICD-10-CM

## 2023-06-06 DIAGNOSIS — K2980 Duodenitis without bleeding: Secondary | ICD-10-CM

## 2023-06-06 DIAGNOSIS — K5710 Diverticulosis of small intestine without perforation or abscess without bleeding: Secondary | ICD-10-CM

## 2023-06-06 DIAGNOSIS — K269 Duodenal ulcer, unspecified as acute or chronic, without hemorrhage or perforation: Secondary | ICD-10-CM | POA: Diagnosis not present

## 2023-06-06 DIAGNOSIS — I10 Essential (primary) hypertension: Secondary | ICD-10-CM | POA: Diagnosis not present

## 2023-06-06 DIAGNOSIS — I959 Hypotension, unspecified: Secondary | ICD-10-CM

## 2023-06-06 DIAGNOSIS — C9000 Multiple myeloma not having achieved remission: Secondary | ICD-10-CM | POA: Diagnosis not present

## 2023-06-06 DIAGNOSIS — C9002 Multiple myeloma in relapse: Secondary | ICD-10-CM

## 2023-06-06 DIAGNOSIS — K2971 Gastritis, unspecified, with bleeding: Secondary | ICD-10-CM

## 2023-06-06 DIAGNOSIS — K921 Melena: Secondary | ICD-10-CM | POA: Diagnosis not present

## 2023-06-06 DIAGNOSIS — K3189 Other diseases of stomach and duodenum: Secondary | ICD-10-CM | POA: Diagnosis not present

## 2023-06-06 DIAGNOSIS — D62 Acute posthemorrhagic anemia: Secondary | ICD-10-CM | POA: Diagnosis not present

## 2023-06-06 HISTORY — PX: BIOPSY: SHX5522

## 2023-06-06 HISTORY — PX: ESOPHAGOGASTRODUODENOSCOPY (EGD) WITH PROPOFOL: SHX5813

## 2023-06-06 LAB — URINE CULTURE: Culture: <10000 — AB

## 2023-06-06 LAB — BASIC METABOLIC PANEL
Anion gap: 8 (ref 5–15)
Anion gap: 9 (ref 5–15)
BUN: 53 mg/dL — ABNORMAL HIGH (ref 8–23)
BUN: 68 mg/dL — ABNORMAL HIGH (ref 8–23)
CO2: 14 mmol/L — ABNORMAL LOW (ref 22–32)
CO2: 17 mmol/L — ABNORMAL LOW (ref 22–32)
Calcium: 7 mg/dL — ABNORMAL LOW (ref 8.9–10.3)
Calcium: 7.1 mg/dL — ABNORMAL LOW (ref 8.9–10.3)
Chloride: 110 mmol/L (ref 98–111)
Chloride: 112 mmol/L — ABNORMAL HIGH (ref 98–111)
Creatinine, Ser: 1.78 mg/dL — ABNORMAL HIGH (ref 0.61–1.24)
Creatinine, Ser: 1.82 mg/dL — ABNORMAL HIGH (ref 0.61–1.24)
GFR, Estimated: 38 mL/min — ABNORMAL LOW (ref >60)
GFR, Estimated: 39 mL/min — ABNORMAL LOW (ref >60)
Glucose, Bld: 116 mg/dL — ABNORMAL HIGH (ref 70–99)
Glucose, Bld: 120 mg/dL — ABNORMAL HIGH (ref 70–99)
Potassium: 4.4 mmol/L (ref 3.5–5.1)
Potassium: 4.7 mmol/L (ref 3.5–5.1)
Sodium: 133 mmol/L — ABNORMAL LOW (ref 135–145)
Sodium: 137 mmol/L (ref 135–145)

## 2023-06-06 LAB — BPAM RBC
Blood Product Expiration Date: 202407142359
Blood Product Expiration Date: 202407142359
ISSUE DATE / TIME: 202406171213
Unit Type and Rh: 7300
Unit Type and Rh: 7300

## 2023-06-06 LAB — BLOOD CULTURE ID PANEL (REFLEXED) - BCID2

## 2023-06-06 LAB — GLUCOSE, CAPILLARY
Glucose-Capillary: 108 mg/dL — ABNORMAL HIGH (ref 70–99)
Glucose-Capillary: 93 mg/dL (ref 70–99)
Glucose-Capillary: 78 mg/dL (ref 70–99)

## 2023-06-06 LAB — CBC
HCT: 24.3 % — ABNORMAL LOW (ref 39.0–52.0)
Hemoglobin: 8.1 g/dL — ABNORMAL LOW (ref 13.0–17.0)
MCH: 30.7 pg (ref 26.0–34.0)
MCHC: 33.3 g/dL (ref 30.0–36.0)
MCV: 92 fL (ref 80.0–100.0)
Platelets: 99 K/uL — ABNORMAL LOW (ref 150–400)
RBC: 2.64 MIL/uL — ABNORMAL LOW (ref 4.22–5.81)
RDW: 16.5 % — ABNORMAL HIGH (ref 11.5–15.5)
WBC: 11 K/uL — ABNORMAL HIGH (ref 4.0–10.5)
nRBC: 1.2 % — ABNORMAL HIGH (ref 0.0–0.2)

## 2023-06-06 LAB — TYPE AND SCREEN
ABO/RH(D): B POS
Antibody Screen: NEGATIVE
Unit division: 0
Unit division: 0

## 2023-06-06 LAB — CULTURE, BLOOD (ROUTINE X 2)

## 2023-06-06 LAB — BLOOD GAS, VENOUS
Acid-base deficit: 9.6 mmol/L — ABNORMAL HIGH (ref 0.0–2.0)
Bicarbonate: 17.1 mmol/L — ABNORMAL LOW (ref 20.0–28.0)
O2 Saturation: 42.8 %
Patient temperature: 37
pCO2, Ven: 40 mmHg — ABNORMAL LOW (ref 44–60)
pH, Ven: 7.24 — ABNORMAL LOW (ref 7.25–7.43)
pO2, Ven: 31 mmHg — CL (ref 32–45)

## 2023-06-06 SURGERY — ESOPHAGOGASTRODUODENOSCOPY (EGD) WITH PROPOFOL
Anesthesia: Monitor Anesthesia Care

## 2023-06-06 MED ORDER — AMLODIPINE BESYLATE 10 MG PO TABS: 5.0000 mg | ORAL_TABLET | Freq: Every day | ORAL | Status: AC

## 2023-06-06 MED ORDER — PANTOPRAZOLE SODIUM 40 MG PO TBEC: 40.0000 mg | DELAYED_RELEASE_TABLET | Freq: Two times a day (BID) | ORAL | 3 refills | Status: AC

## 2023-06-06 MED ORDER — TAMSULOSIN HCL 0.4 MG PO CAPS: 0.4000 mg | ORAL_CAPSULE | Freq: Every day | ORAL | 1 refills | Status: AC

## 2023-06-06 MED ORDER — PROPOFOL 500 MG/50ML IV EMUL
INTRAVENOUS | Status: DC | PRN
  Administered 2023-06-06: 100 ug/kg/min via INTRAVENOUS

## 2023-06-06 MED ORDER — CARVEDILOL 6.25 MG PO TABS: 6.2500 mg | ORAL_TABLET | Freq: Two times a day (BID) | ORAL | Status: AC

## 2023-06-06 MED ORDER — LACTATED RINGERS IV SOLN
INTRAVENOUS | Status: AC | PRN
  Administered 2023-06-06: 10 mL/h via INTRAVENOUS

## 2023-06-06 MED ORDER — LIDOCAINE 2% (20 MG/ML) 5 ML SYRINGE
INTRAMUSCULAR | Status: DC | PRN
  Administered 2023-06-06: 60 mg via INTRAVENOUS

## 2023-06-06 MED ORDER — PROPOFOL 10 MG/ML IV BOLUS
INTRAVENOUS | Status: DC | PRN
  Administered 2023-06-06: 20 mg via INTRAVENOUS

## 2023-06-06 SURGICAL SUPPLY — 15 items

## 2023-06-06 NOTE — Transfer of Care (Signed)
Immediate Anesthesia Transfer of Care Note  Patient: Marcus Hatfield  Procedure(s) Performed: ESOPHAGOGASTRODUODENOSCOPY (EGD) WITH PROPOFOL BIOPSY  Patient Location: Endoscopy Unit  Anesthesia Type:MAC  Level of Consciousness: awake and alert   Airway & Oxygen Therapy: Patient Spontanous Breathing  Post-op Assessment: Report given to RN and Post -op Vital signs reviewed and stable  Post vital signs: Reviewed and stable  Last Vitals:  Vitals Value Taken Time  BP 116/74   Temp    Pulse 85 06/06/23 1104  Resp 18 06/06/23 1104  SpO2 94 % 06/06/23 1104  Vitals shown include unvalidated device data.  Last Pain:  Vitals:   06/06/23 0935  TempSrc: Temporal  PainSc: 0-No pain         Complications: No notable events documented.

## 2023-06-06 NOTE — Anesthesia Postprocedure Evaluation (Signed)
Anesthesia Post Note  Patient: Engineer, water  Procedure(s) Performed: ESOPHAGOGASTRODUODENOSCOPY (EGD) WITH PROPOFOL BIOPSY     Patient location during evaluation: PACU Anesthesia Type: MAC Level of consciousness: awake and alert and oriented Pain management: pain level controlled Vital Signs Assessment: post-procedure vital signs reviewed and stable Respiratory status: spontaneous breathing, nonlabored ventilation and respiratory function stable Cardiovascular status: stable and blood pressure returned to baseline Postop Assessment: no apparent nausea or vomiting Anesthetic complications: no   No notable events documented.  Last Vitals:  Vitals:   06/06/23 1110 06/06/23 1120  BP: 117/65 132/72  Pulse: 81 84  Resp: 12 19  Temp:    SpO2: 97% 98%    Last Pain:  Vitals:   06/06/23 1120  TempSrc:   PainSc: 0-No pain                 Leeanne Butters A.

## 2023-06-06 NOTE — Progress Notes (Signed)
Patient returned from endo. Patient denies complaints. Family at bedside.

## 2023-06-06 NOTE — Discharge Summary (Signed)
PATIENT DETAILS Name: Marcus Hatfield Age: 77 y.o. Sex: male Date of Birth: 08-31-1946 MRN: [id number]. Admitting Physician: [name], MD EXB:[id number], Ravisankar, MD  Admit Date: 06/05/2023 Discharge date: 06/06/2023  Recommendations for Outpatient Follow-up:  Follow up with PCP in 1-2 weeks Please obtain CMP/CBC in one week Please follow EGD biopsy results   Admitted From:  Home  Disposition: Home   Discharge Condition: good  CODE STATUS:   Code Status: Full Code   Diet recommendation:  Diet Order             Diet Heart Room service appropriate? Yes; Fluid consistency: Thin  Diet effective now           Diet - low sodium heart healthy                    Brief Summary: 77 year old with history of multiple myeloma-with skull base mass/metastatic lesion-s/p radiation treatment-presented to the hospital with melena and acute blood loss anemia.  Brief Hospital Course: Upper GI bleeding with acute blood loss anemia No further melanotic stool since yesterday afternoon-required 2 units of PRBC-EGD with nonbleeding duodenal ulcers.  GI recommending PPI twice daily x 10 weeks and then daily thereafter.  Tolerating diet post EGD-has ambulated to the bathroom without any major issues-feels comfortable going home.  Please follow EGD biopsy results. Patient instructed to try to avoid NSAIDs and aspirin.  Transient hypotension Secondary to GI bleeding-has resolved-blood pressure has been stable since yesterday evening.  Syncope Due to hypotension-no PE on CTA chest-telemetry unremarkable.  Given clear-cut history of hypertension-do not think any further workup is required at this point.  Lactic acidosis Secondary to hypotension-doubt sepsis Blood cultures negative-doubt requires any antibiotics on discharge.  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 Charlestine Hatfield  previously scheduled.  HTN Discussed at length with patient/spouse-to slowly resume his blood pressure medications 1 at a time.  Resume Coreg 6/19-recheck blood pressure on 6/20-if SBP more than 120-resume amlodipine.  BMI: Estimated body mass index is 26.27 kg/m as calculated from the following:   Height as of this encounter: 5\' 9"  (1.753 m).   Weight as of this encounter: 80.7 kg.    Discharge Diagnoses:  Principal Problem:   Acute blood loss anemia   Discharge Instructions:  Activity:  As tolerated   Discharge Instructions     Call MD for:   Complete by: As directed    Black starry stools   Diet - low sodium heart healthy   Complete by: As directed    Discharge instructions   Complete by: As directed    Follow with Primary MD  Avva, Ravisankar, MD in 1-2 weeks  Please get a complete blood count and chemistry panel checked by your Primary MD at your next visit, and again as instructed by your Primary MD.  Get Medicines reviewed and adjusted: Please take all your medications with you for your next visit with your Primary MD  Laboratory/radiological data: Please request your Primary MD to go over all hospital tests and procedure/radiological results at the follow up, please ask your Primary MD to get all Hospital records sent to his/her office.  In some cases, they will be blood work, cultures and biopsy results pending at the time of your discharge. Please request that your primary care M.D. follows up on these results.  Also Note the following: If you experience worsening of your admission symptoms, develop shortness  of breath, life threatening emergency, suicidal or homicidal thoughts you must seek medical attention immediately by calling 911 or calling your MD immediately  if symptoms less severe.  You must read complete instructions/literature along with all the possible adverse reactions/side effects for all the Medicines you take and that have been prescribed to  you. Take any new Medicines after you have completely understood and accpet all the possible adverse reactions/side effects.   Do not drive when taking Pain medications or sleeping medications (Benzodaizepines)  Do not take more than prescribed Pain, Sleep and Anxiety Medications. It is not advisable to combine anxiety,sleep and pain medications without talking with your primary care practitioner  Special Instructions: If you have smoked or chewed Tobacco  in the last 2 yrs please stop smoking, stop any regular Alcohol  and or any Recreational drug use.  Wear Seat belts while driving.  Please note: You were cared for by a hospitalist during your hospital stay. Once you are discharged, your primary care physician will handle any further medical issues. Please note that NO REFILLS for any discharge medications will be authorized once you are discharged, as it is imperative that you return to your primary care physician (or establish a relationship with a primary care physician if you do not have one) for your post hospital discharge needs so that they can reassess your need for medications and monitor your lab values.   Increase activity slowly   Complete by: As directed       Allergies as of 06/06/2023       Reactions   Penicillins Hives   Has patient had a PCN reaction causing immediate rash, facial/tongue/throat swelling, SOB or lightheadedness with hypotension:Yes Has patient had a PCN reaction causing severe rash involving mucus membranes or skin necrosis: Yes Has patient had a PCN reaction that required hospitalization No Has patient had a PCN reaction occurring within the last 10 years: No If all of the above answers are "NO", then may proceed with Cephalosporin use.   Latex Rash   Tape Rash   adhesive        Medication List     STOP taking these medications    aspirin EC 81 MG tablet   furosemide 20 MG tablet Commonly known as: LASIX       TAKE these medications     acyclovir 400 MG tablet Commonly known as: ZOVIRAX Take 1 tablet (400 mg total) by mouth 2 (two) times daily.   amLODipine 10 MG tablet Commonly known as: NORVASC Take 0.5 tablets (5 mg total) by mouth daily. Start taking on: June 08, 2023 What changed:  how much to take These instructions start on June 08, 2023. If you are unsure what to do until then, ask your doctor or other care provider.   carvedilol 6.25 MG tablet Commonly known as: COREG Take 1 tablet (6.25 mg total) by mouth 2 (two) times daily with a meal. Start taking on: June 07, 2023   dexamethasone 4 MG tablet Commonly known as: DECADRON Take 1 tablet (4 mg total) by mouth 2 (two) times daily with a meal.   dexamethasone 4 MG tablet Commonly known as: DECADRON Take 5 tablets (20 mg total) by mouth once a week. Take the day after darzalex faspro. Take with breakfast.   diclofenac Sodium 1 % Gel Commonly known as: VOLTAREN Apply 2 g topically daily as needed (for pain).   docusate sodium 100 MG capsule Commonly known as: COLACE Take 1 capsule (100  mg total) by mouth daily as needed for mild constipation.   ferrous sulfate 325 (65 FE) MG tablet Take 1 tablet (325 mg total) by mouth 2 (two) times daily as needed (iron).   finasteride 5 MG tablet Commonly known as: PROSCAR Take 5 mg by mouth daily.   fluticasone 50 MCG/ACT nasal spray Commonly known as: FLONASE Place 2 sprays into both nostrils daily as needed for allergies.   lactulose 10 GM/15ML solution Commonly known as: CHRONULAC Take 10 g by mouth daily as needed for constipation.   lenalidomide 10 MG capsule Commonly known as: Revlimid Take 1 capsule (10 mg total) by mouth daily. Take for 21 days on, 7 days off, repeat every 28 days.   ondansetron 8 MG tablet Commonly known as: Zofran Take 1 tablet (8 mg total) by mouth every 8 (eight) hours as needed for nausea or vomiting.   oxyCODONE 5 MG immediate release tablet Commonly known as: Oxy  IR/ROXICODONE Take 5 mg by mouth every 8 (eight) hours as needed for moderate pain, severe pain or breakthrough pain.   pantoprazole 40 MG tablet Commonly known as: Protonix Take 1 tablet (40 mg total) by mouth 2 (two) times daily. Take bid x 10 weeks, followed by daily thereafter   prochlorperazine 10 MG tablet Commonly known as: COMPAZINE Take 1 tablet (10 mg total) by mouth every 6 (six) hours as needed for nausea or vomiting.   tamsulosin 0.4 MG Caps capsule Commonly known as: FLOMAX Take 1 capsule (0.4 mg total) by mouth daily after supper. Start taking on: June 07, 2023               Durable Medical Equipment  (From admission, onward)           Start     Ordered   06/06/23 1358  For home use only DME Walker rolling  Once       Question Answer Comment  Walker: With 5 Inch Wheels   Patient needs a walker to treat with the following condition Physical deconditioning      06/06/23 1358            Follow-up Information     Avva, Ravisankar, MD. Schedule an appointment as soon as possible for a visit in 1 week(s).   Specialty: Internal Medicine Contact information: [address] [place] [address] [phone]                Allergies  Allergen Reactions   Penicillins Hives    Has patient had a PCN reaction causing immediate rash, facial/tongue/throat swelling, SOB or lightheadedness with hypotension:Yes Has patient had a PCN reaction causing severe rash involving mucus membranes or skin necrosis: Yes Has patient had a PCN reaction that required hospitalization No Has patient had a PCN reaction occurring within the last 10 years: No If all of the above answers are "NO", then may proceed with Cephalosporin use.    Latex Rash   Tape Rash    adhesive     Other Procedures/Studies: CT Angio Chest PE W and/or Wo Contrast  Result Date: 06/05/2023 CLINICAL DATA:  PE suspected, history of renal cell carcinoma and multiple myeloma * Tracking  Code: BO * EXAM: CT ANGIOGRAPHY CHEST WITH CONTRAST TECHNIQUE: Multidetector CT imaging of the chest was performed using the standard protocol during bolus administration of intravenous contrast. Multiplanar CT image reconstructions and MIPs were obtained to evaluate the vascular anatomy. RADIATION DOSE REDUCTION: This exam was performed according to the departmental dose-optimization  program which includes automated exposure control, adjustment of the mA and/or kV according to patient size and/or use of iterative reconstruction technique. CONTRAST:  OMNIPAQUE IOHEXOL 350 MG/ML SOLN COMPARISON:  CT chest abdomen pelvis, 05/21/2023 FINDINGS: Cardiovascular: Satisfactory opacification of the pulmonary arteries to the segmental level. No evidence of pulmonary embolism. Cardiomegaly. Enlargement of the main pulmonary artery measuring up to 4.0 cm in caliber. No pericardial effusion. Mediastinum/Nodes: No enlarged mediastinal, hilar, or axillary lymph nodes. Thyroid gland, trachea, and esophagus demonstrate no significant findings. Lungs/Pleura: Background of very fine centrilobular nodularity, most concentrated in the lung apices. Dependent bibasilar scarring or atelectasis. No pleural effusion or pneumothorax. Upper Abdomen: No acute abnormality. Musculoskeletal: No chest wall abnormality. No acute osseous findings. Numerous lytic lesions throughout the spine, most notably a large lesion of the T6 vertebral body (series 10, image 81). Unchanged subacute appearing fracture deformities of the bilateral clavicular heads (series 8, image 76). Review of the MIP images confirms the above findings. IMPRESSION: 1. Negative examination for pulmonary embolism. 2. Background of very fine centrilobular nodularity, most concentrated in the lung apices, most commonly seen in smoking-related respiratory bronchiolitis. 3. Cardiomegaly. 4. Enlargement of the main pulmonary artery, as can be seen in pulmonary hypertension. 5.  Numerous lytic lesions throughout the spine, most notably a large lesion of the T6 vertebral body. Findings are consistent with history of multiple myeloma. 6. Unchanged subacute appearing fracture deformities of the bilateral clavicular heads. Electronically Signed   By: Jearld Lesch M.D.   On: 06/05/2023 11:19   CT ANGIO GI BLEED  Result Date: 06/05/2023 CLINICAL DATA:  Acute GI bleeding, melena, hypotension, history of renal cell carcinoma and multiple myeloma * Tracking Code: BO * EXAM: CTA ABDOMEN AND PELVIS WITHOUT AND WITH CONTRAST TECHNIQUE: Multidetector CT imaging of the abdomen and pelvis was performed using the standard protocol during bolus administration of intravenous contrast. Multiplanar reconstructed images and MIPs were obtained and reviewed to evaluate the vascular anatomy. RADIATION DOSE REDUCTION: This exam was performed according to the departmental dose-optimization program which includes automated exposure control, adjustment of the mA and/or kV according to patient size and/or use of iterative reconstruction technique. CONTRAST:  OMNIPAQUE IOHEXOL 350 MG/ML SOLN COMPARISON:  CT chest abdomen pelvis, 05/21/2023 FINDINGS: VASCULAR Normal contour and caliber of the abdominal aorta. No evidence of aneurysm, dissection, or other acute aortic pathology. No significant atherosclerosis. Standard branching pattern of the abdominal aorta with solitary bilateral renal arteries. Review of the MIP images confirms the above findings. NON-VASCULAR Lower chest: Please see separately reported examination of the chest. Hepatobiliary: No solid liver abnormality is seen. Unchanged benign hemangioma of the central right lobe of the liver (series 6, image 60). Numerous additional small cysts or hemangiomata throughout the liver, unchanged. No gallstones, gallbladder wall thickening, or biliary dilatation. Pancreas: Unchanged cystic lesion of the dorsal pancreatic tail measuring 1.3 x 1.2 cm (series 6,  image 80). No pancreatic ductal dilatation or surrounding inflammatory changes. Spleen: Normal in size without significant abnormality. Adrenals/Urinary Tract: Normal left adrenal gland. Status post right nephrectomy and adrenalectomy. No suspicious soft tissue or contrast enhancement in the nephrectomy bed. Unchanged appearance of the posterior midportion and inferior pole of the left kidney status post partial nephrectomy. No suspicious contrast enhancement. Simple, benign left renal cortical cysts, for which no specific further follow-up or characterization is required bladder is unremarkable. Stomach/Bowel: Stomach is within normal limits. Appendix appears normal. Descending and sigmoid diverticulosis. Multiple hyperdense, inspissated descending and sigmoid diverticula on  precontrast imaging, which are without evidence of postcontrast enhancement and unchanged compared to prior examination. No specific nidus of intraluminal contrast extravasation identified. Lymphatic: No enlarged abdominal or pelvic lymph nodes. Reproductive: No mass or other significant abnormality. Other: Unchanged, large right lower quadrant hernia, possibly spigelian type, containing multiple nonobstructed loops of distal ileum and the cecum (series 6, image 150). Fat containing left inguinal hernia. No ascites. Musculoskeletal: No acute osseous findings. Unchanged lytic lesions throughout the vertebral bodies (series 13, image 67). IMPRESSION: 1. Descending and sigmoid diverticulosis. Multiple hyperdense, inspissated descending and sigmoid diverticula on precontrast imaging, which are without evidence of postcontrast enhancement and unchanged compared to prior examination. No specific nidus of intraluminal contrast extravasation identified to localize GI bleeding. 2. Status post right nephrectomy and adrenalectomy. No suspicious soft tissue or contrast enhancement in the nephrectomy bed. 3. Unchanged appearance of the posterior midportion  and inferior pole of the left kidney status post partial nephrectomy. No suspicious contrast enhancement. 4. Unchanged cystic lesion of the dorsal pancreatic tail measuring 1.3 x 1.2 cm, likely a small pseudocyst or side branch intraductal papillary mucinous neoplasm. As there is no observed increased risk of malignancy for such lesions smaller than 2 cm, no specific further follow-up or characterization is required; continued attention with oncologic surveillance imaging for this patient with multiple known malignancies. 5. Unchanged lytic lesions throughout the vertebral bodies, consistent with history of multiple myeloma. 6. Unchanged, large right lower quadrant hernia containing multiple nonobstructed loops of distal ileum and the cecum. Electronically Signed   By: Jearld Lesch M.D.   On: 06/05/2023 11:08   CT HEAD WO CONTRAST ( )  Result Date: 06/05/2023 CLINICAL DATA:  Mental status change, unknown cause EXAM: CT HEAD WITHOUT CONTRAST TECHNIQUE: Contiguous axial images were obtained from the base of the skull through the vertex without intravenous contrast. RADIATION DOSE REDUCTION: This exam was performed according to the departmental dose-optimization program which includes automated exposure control, adjustment of the mA and/or kV according to patient size and/or use of iterative reconstruction technique. COMPARISON:  MR Brain 05/20/23 FINDINGS: Brain: Redemonstrated is a large petroclival mass, as seen on prior brain MRI, not significantly changed in size when accounting for differences in technique. Redemonstrated are multiple bilateral lucent calvarial lesions, which also appear unchanged for differences in size. No hemorrhage. No extra-axial fluid collection. No CT evidence of an acute cortical infarct. No hydrocephalus. Vascular: No hyperdense vessel or unexpected calcification. Skull: There are multiple lucent calvarial lesions which were present brain. Sinuses/Orbits: Moderate-to-large  right-sided mastoid effusion. No middle ear effusion. Bilateral lens replacement. Bilateral maxillary, frontal anterior ethmoid air cells are clear. The posterior ethmoid air cells in the bilateral sphenoid sinuses are obliterated by a large mass in the petrous clival region. Other: None. IMPRESSION: 1. No acute intracranial abnormality. 2. Redemonstrated is a large petroclival mass, as seen on prior brain MRI, not significantly changed in size when accounting for differences in technique. 3. Redemonstrated are multiple bilateral lucent calvarial lesions, which also appear unchanged for differences in size. Electronically Signed   By: Lorenza Cambridge M.D.   On: 06/05/2023 10:31   DG Chest Port 1 View  Result Date: 06/05/2023 CLINICAL DATA:  Questionable sepsis EXAM: PORTABLE CHEST 1 VIEW COMPARISON:  Chest radiograph 05/06/2023, CT chest/abdomen/pelvis 05/21/2023 FINDINGS: The cardiomediastinal silhouette is stable and within normal limits, allowing for rightward patient rotation. There is no focal consolidation or pulmonary edema. There is no pleural effusion or pneumothorax There is no acute osseous abnormality. The  previously seen pathologic right clavicle fracture is not well seen on the current study. Remote right rib fractures are again noted. IMPRESSION: No radiographic evidence of acute cardiopulmonary process. Electronically Signed   By: Lesia Hausen M.D.   On: 06/05/2023 09:07   CT CHEST ABDOMEN PELVIS WO CONTRAST  Result Date: 05/21/2023 CLINICAL DATA:  Renal cell carcinoma and myeloma; * Tracking Code: BO * EXAM: CT CHEST, ABDOMEN AND PELVIS WITHOUT CONTRAST TECHNIQUE: Multidetector CT imaging of the chest, abdomen and pelvis was performed following the standard protocol without IV contrast. RADIATION DOSE REDUCTION: This exam was performed according to the departmental dose-optimization program which includes automated exposure control, adjustment of the mA and/or kV according to patient size  and/or use of iterative reconstruction technique. COMPARISON:  CT abdomen and pelvis dated May 05, 2023 FINDINGS: CT CHEST FINDINGS Cardiovascular: Normal heart size. No pericardial effusion. Normal caliber thoracic aorta with mild atherosclerotic disease. Mediastinum/Nodes: Esophagus and thyroid are unremarkable. No enlarged lymph nodes seen in the chest. Lungs/Pleura: Central airways are patent. Bibasilar atelectasis. No consolidation, pleural effusion or pneumothorax. Musculoskeletal: Numerous scattered lucent lesions seen involving the ribs, sternum, thoracic spine and bilateral clavicles. A large lucent lesion of the T6 vertebral body involvement of the posterior cortex, extension into the spinal canal can not be excluded. CT ABDOMEN PELVIS FINDINGS Hepatobiliary: Scattered small low-attenuation liver lesions which are too small to accurately characterize. Hypoattenuating lesions of the central liver located on series 2, image 51 and hepatic dome on image 46, previously characterized as benign hemangiomas. Gallbladder is unremarkable. Pancreas: Unremarkable. No pancreatic ductal dilatation or surrounding inflammatory changes. Spleen: Normal in size without focal abnormality. Adrenals/Urinary Tract: No adrenal nodules. Prior right nephrectomy. Left adrenal gland is unremarkable. No left hydronephrosis. Status post partial left nephrectomy. Simple appearing left renal cyst, no specific follow-up imaging is recommended. Bladder is unremarkable. Stomach/Bowel: Stomach is within normal limits. Appendix appears normal. Mild diverticulosis. No evidence of bowel wall thickening, distention, or inflammatory changes. Vascular/Lymphatic: No significant vascular findings are present. No enlarged abdominal or pelvic lymph nodes. Reproductive: Moderate prostatomegaly status post TURP. Other: Large hernia of the lateral right abdominal wall containing nondilated lead loops of small and large bowel. No abdominopelvic ascites.  No intra-abdominal free air. Musculoskeletal: Numerous lytic osseous lesions seen throughout the thoracic spine and pelvis. Mild compression fracture of L4 with no evidence of retropulsion, unchanged when compared with the prior exam. IMPRESSION: 1. Numerous lytic osseous lesions, consistent with patient's history of multiple myeloma. A large lucent lesion of the T6 vertebral body involves the posterior cortex, extension into the spinal canal can not be excluded. Consider further evaluation with contrast-enhanced MRI of the thoracic spine. 2. Status post right nephrectomy no evidence of recurrent mass in the right nephrectomy bed. 3. No evidence of soft tissue metastatic disease in the chest, abdomen or pelvis. 4. Mild aortic Atherosclerosis (ICD10-I70.0). Electronically Signed   By: Allegra Lai M.D.   On: 05/21/2023 20:22   DG Bone Survey Met  Result Date: 05/21/2023 CLINICAL DATA:  Multiple myeloma EXAM: METASTATIC BONE SURVEY COMPARISON:  None Available. FINDINGS: There are multiple small round lucencies of varying sizes in calvarium. Sella appears largest unusual in size. There are few radiolucent lesions of varying sizes in the shaft of right humerus. Possible small low-density focus is seen in the mid shaft of right clavicle. There are radiolucencies in the distal shaft of left humerus. Small faint lucencies are seen in the proximal shaft of right radius and distal  shaft of right ulna. Possible small faint lucency is seen in the distal shaft of left ulna. Degenerative changes are noted with bony spurs in cervical and thoracic spine. There are no focal infiltrates in the lung fields. Deformities in the posterolateral aspects of right fifth and sixth ribs suggest old healed fractures. Possible small faint lucencies are seen in the posterior aspects of left fourth and sixth ribs. Few small lucencies are noted in left iliac bone and proximal right femur. There is linear lucency in the proximal shaft of  right femur. Small faint lucency is seen in the distal shaft of right femur. There are few low-density lesions of varying sizes in the shaft of left femur. IMPRESSION: There are multiple radiolucencies of varying sizes in axial and appendicular skeleton as described in the body of the report consistent with multiple myeloma lesions. Electronically Signed   By: Ernie Avena M.D.   On: 05/21/2023 19:36   MR Brain W and Wo Contrast  Addendum Date: 05/20/2023   ADDENDUM REPORT: 05/20/2023 15:48 ADDENDUM: Lytic lesions have been identified previously with this patient. 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 Electronically Signed   By: Marin Roberts M.D.   On: 05/20/2023 15:48   Result Date: 05/20/2023 CLINICAL DATA:  Visual is turbinates to the left eye. Abnormal CT scan. EXAM: MRI HEAD WITHOUT AND WITH CONTRAST TECHNIQUE: Multiplanar, multiecho pulse sequences of the brain and surrounding structures were obtained without and with intravenous contrast. CONTRAST:  8mL GADAVIST GADOBUTROL 1 MMOL/ML IV SOLN COMPARISON:  CT of the head and maxillofacial 02/25/2021. FINDINGS: Brain: No acute infarct, hemorrhage, or mass lesion is present. Mild atrophy and white matter changes are likely within normal limits for age. The ventricles are of normal size. No significant extraaxial fluid collection is present. Deep brain nuclei are within normal limits. Enhancing osseous tumor extends to the anterior margin of the right IAC. Internal auditory canals are otherwise within normal limits bilaterally. Inner ear structures are within normal limits. Vascular: Insert normal flow Skull and upper cervical spine: A slightly heterogeneous enhancing mass lesion is again noted at the skull base. This extends along the petrous ridge bilaterally, greater on the right. Tumor surrounds the right internal carotid artery without definite stenosis. There is some heterogeneity to the tumor on  precontrast T2 weighted imaging. The lesion measures 7.8 x 5.6 x 3.3 cm. Multiple additional enhancing lesions are present throughout the skull. Lesion in the anterior right frontal skull measures 11 mm on image 110 of series 17. A lesion in the left parietal skull on image 107 of series 17 measures 11 mm. Multiple other smaller lesions are present as well. Sinuses/Orbits: Minimal mucosal thickening scratched at minimal fluid is present in the right maxillary sinus. The paranasal sinuses are otherwise clear. A right mastoid effusion is present. The tumor impacts the right eustachian tube. Other: IMPRESSION: 1. 7.8 x 5.6 x 3.3 cm enhancing mass lesion at the skull base extending along the petrous ridge bilaterally, greater on the right. 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 There are some intrinsic T2 signal scratched at there is some intrinsic T2 signal and chordoma still considered. However, given the multiple other enhancing lesions throughout the skull, this most likely represents metastatic disease. 2. No acute intracranial abnormality. 3. Mild atrophy and white matter changes are likely within normal limits for age. 4. Right mastoid effusion secondary to obstruction of the eustachian tube. 5. Minimal right maxillary sinus disease. Electronically  Signed: By: Marin Roberts M.D. On: 05/20/2023 15:33   MR ORBITS W WO CONTRAST  Result Date: 05/20/2023 CLINICAL DATA:  Visual disturbance to the left eye for 3 weeks. Vision loss, monocular. Skull base mass. EXAM: MRI OF THE ORBITS WITHOUT AND WITH CONTRAST TECHNIQUE: Multiplanar, multi-echo pulse sequences of the orbits and surrounding structures were acquired including fat saturation techniques, before and after intravenous contrast administration. CONTRAST:  8mL GADAVIST GADOBUTROL 1 MMOL/ML IV SOLN COMPARISON:  CT head without contrast 05/20/2023. MR head without and with contrast 05/20/2023 FINDINGS: Enhancing skull base tumor centered in  the clivus measuring 7.8 x 5.3 x 3.7 cm. Tumor extends along the petrous apex, right greater than left. The tumor surrounds the petrous right ICA. Tumor extends into the floor of the sella abuts the cavernous sinus bilaterally. Tumor invades the posterior ethmoid air cells and sphenoid sinuses bilaterally. Tumor extends to the left orbital apex and likely impacts the left optic nerve. The optic chiasm is within normal limits. Tumor extends into the nasal cavity more prominently on the left. Multiple additional foci of enhancement are present within the skull. Orbits: Bilateral lens replacements are present. The globes are within normal limits. The optic nerve is within normal limits within the canal bilaterally. Extraocular muscles are unremarkable. The superior ophthalmic vein is symmetric. Visualized sinuses: Tumor extends into the posterior ethmoid air cells and sphenoid sinuses as described. A right mastoid effusion is secondary to obstruction. Soft tissues: Extracranial soft tissues are unremarkable. Limited intracranial: Visualized intracranial contents are within normal limits. IMPRESSION: 1. Enhancing skull base tumor centered in the clivus measuring 7.8 x 5.3 x 3.7 cm. Tumor extends along the petrous apex, right greater than left. The tumor surrounds the petrous right ICA. Tumor extends into the floor of the sella abuts the cavernous sinus bilaterally. Tumor invades the posterior ethmoid air cells and sphenoid sinuses bilaterally. Tumor extends to the left orbital apex and likely impacts the left optic nerve. Based on the initial CT, consideration of chordoma was made. Although there is some intrinsic T2 signal and heterogeneous enhancement, the multiple other enhancing lesions suggest this is most likely metastatic disease. Tumor extending into the nasal cavity may be assessable for biopsy. 2. Tumor extends into the nasal cavity more prominently on the left. 3. Right mastoid effusion is secondary to  obstruction. Electronically Signed   By: Marin Roberts M.D.   On: 05/20/2023 15:41   CT Head Wo Contrast  Result Date: 05/20/2023 CLINICAL DATA:  Vision loss. EXAM: CT HEAD WITHOUT CONTRAST TECHNIQUE: Contiguous axial images were obtained from the base of the skull through the vertex without intravenous contrast. RADIATION DOSE REDUCTION: This exam was performed according to the departmental dose-optimization program which includes automated exposure control, adjustment of the mA and/or kV according to patient size and/or use of iterative reconstruction technique. COMPARISON:  02/25/2021. FINDINGS: Brain: Large, hyperattenuating mass enlarges and erodes the clivus, extending through the sella turcica, obliterating the sphenoid sinuses and extending into the posterior ethmoid air cells. Mass diffusely involves the cavernous sinuses, extending more laterally on the left than on the right. The mass broadly contacts the optic nerves from the orbital apices to anterior to the optic chiasm. Mass measures 6.2 cm from right to left, 4 cm from superior to inferior and 5.2 cm anterior to posterior. No other extra-axial masses. No intra-axial masses. No evidence of an infarct and no intracranial hemorrhage. No hydrocephalus. Vascular: No hyperdense vessel or unexpected calcification. Mass broadly involves both cavernous  sinuses as detailed above. Skull: In addition to the clivus, mass erodes the petrous portions of both temporal bones as well as the sphenoid bone. Sinuses/Orbits: No abnormality of either globe. No intraorbital abnormality. Sinuses unaffected by the mass are clear. Other: None. IMPRESSION: 1. Large mass at the skull base, eroding and expanding the clivus, also involving the sphenoid bone and the petrous portions of both temporal bones. The mass broadly abuts both optic nerves between the chiasm and orbital apices. Mass also broadly involves both cavernous sinuses. 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 Recommend further assessment with brain MRI without and with contrast. 2. No acute finding. No evidence of an infarct, hydrocephalus or intracranial hemorrhage. Electronically Signed   By: Amie Portland M.D.   On: 05/20/2023 10:22     TODAY-DAY OF DISCHARGE:  Subjective:   Marcus Hatfield today has no headache,no chest abdominal pain,no new weakness tingling or numbness, feels much better wants to go home today.   Objective:   Blood pressure 132/72, pulse 84, temperature (!) 97.4 F (36.3 C), temperature source Temporal, resp. rate 19, height 5\' 9"  (1.753 m), weight 80.7 kg, SpO2 98 %.  Intake/Output Summary (Last 24 hours) at 06/06/2023 1424 Last data filed at 06/06/2023 1300 Gross per 24 hour  Intake 3632.8 ml  Output 1250 ml  Net 2382.8 ml   Filed Weights   06/06/23 0935  Weight: 80.7 kg    Exam: Awake Alert, Oriented *3, No new F.N deficits, Normal affect [place].AT,PERRAL Supple Neck,No JVD, No cervical lymphadenopathy appriciated.  Symmetrical Chest wall movement, Good air movement bilaterally, CTAB RRR,No Gallops,Rubs or new Murmurs, No Parasternal Heave +ve B.Sounds, Abd Soft, Non tender, No organomegaly appriciated, No rebound -guarding or rigidity. No Cyanosis, Clubbing or edema, No new Rash or bruise   PERTINENT RADIOLOGIC STUDIES: CT Angio Chest PE W and/or Wo Contrast  Result Date: 06/05/2023 CLINICAL DATA:  PE suspected, history of renal cell carcinoma and multiple myeloma * Tracking Code: BO * EXAM: CT ANGIOGRAPHY CHEST WITH CONTRAST TECHNIQUE: Multidetector CT imaging of the chest was performed using the standard protocol during bolus administration of intravenous contrast. Multiplanar CT image reconstructions and MIPs were obtained to evaluate the vascular anatomy. RADIATION DOSE REDUCTION: This exam was performed according to the departmental dose-optimization program which includes  automated exposure control, adjustment of the mA and/or kV according to patient size and/or use of iterative reconstruction technique. CONTRAST:  OMNIPAQUE IOHEXOL 350 MG/ML SOLN COMPARISON:  CT chest abdomen pelvis, 05/21/2023 FINDINGS: Cardiovascular: Satisfactory opacification of the pulmonary arteries to the segmental level. No evidence of pulmonary embolism. Cardiomegaly. Enlargement of the main pulmonary artery measuring up to 4.0 cm in caliber. No pericardial effusion. Mediastinum/Nodes: No enlarged mediastinal, hilar, or axillary lymph nodes. Thyroid gland, trachea, and esophagus demonstrate no significant findings. Lungs/Pleura: Background of very fine centrilobular nodularity, most concentrated in the lung apices. Dependent bibasilar scarring or atelectasis. No pleural effusion or pneumothorax. Upper Abdomen: No acute abnormality. Musculoskeletal: No chest wall abnormality. No acute osseous findings. Numerous lytic lesions throughout the spine, most notably a large lesion of the T6 vertebral body (series 10, image 81). Unchanged subacute appearing fracture deformities of the bilateral clavicular heads (series 8, image 76). Review of the MIP images confirms the above findings. IMPRESSION: 1. Negative examination for pulmonary embolism. 2. Background of very fine centrilobular nodularity, most concentrated in the lung apices, most commonly seen  in smoking-related respiratory bronchiolitis. 3. Cardiomegaly. 4. Enlargement of the main pulmonary artery, as can be seen in pulmonary hypertension. 5. Numerous lytic lesions throughout the spine, most notably a large lesion of the T6 vertebral body. Findings are consistent with history of multiple myeloma. 6. Unchanged subacute appearing fracture deformities of the bilateral clavicular heads. Electronically Signed   By: Jearld Lesch M.D.   On: 06/05/2023 11:19   CT ANGIO GI BLEED  Result Date: 06/05/2023 CLINICAL DATA:  Acute GI bleeding, melena,  hypotension, history of renal cell carcinoma and multiple myeloma * Tracking Code: BO * EXAM: CTA ABDOMEN AND PELVIS WITHOUT AND WITH CONTRAST TECHNIQUE: Multidetector CT imaging of the abdomen and pelvis was performed using the standard protocol during bolus administration of intravenous contrast. Multiplanar reconstructed images and MIPs were obtained and reviewed to evaluate the vascular anatomy. RADIATION DOSE REDUCTION: This exam was performed according to the departmental dose-optimization program which includes automated exposure control, adjustment of the mA and/or kV according to patient size and/or use of iterative reconstruction technique. CONTRAST:  OMNIPAQUE IOHEXOL 350 MG/ML SOLN COMPARISON:  CT chest abdomen pelvis, 05/21/2023 FINDINGS: VASCULAR Normal contour and caliber of the abdominal aorta. No evidence of aneurysm, dissection, or other acute aortic pathology. No significant atherosclerosis. Standard branching pattern of the abdominal aorta with solitary bilateral renal arteries. Review of the MIP images confirms the above findings. NON-VASCULAR Lower chest: Please see separately reported examination of the chest. Hepatobiliary: No solid liver abnormality is seen. Unchanged benign hemangioma of the central right lobe of the liver (series 6, image 60). Numerous additional small cysts or hemangiomata throughout the liver, unchanged. No gallstones, gallbladder wall thickening, or biliary dilatation. Pancreas: Unchanged cystic lesion of the dorsal pancreatic tail measuring 1.3 x 1.2 cm (series 6, image 80). No pancreatic ductal dilatation or surrounding inflammatory changes. Spleen: Normal in size without significant abnormality. Adrenals/Urinary Tract: Normal left adrenal gland. Status post right nephrectomy and adrenalectomy. No suspicious soft tissue or contrast enhancement in the nephrectomy bed. Unchanged appearance of the posterior midportion and inferior pole of the left kidney status  post partial nephrectomy. No suspicious contrast enhancement. Simple, benign left renal cortical cysts, for which no specific further follow-up or characterization is required bladder is unremarkable. Stomach/Bowel: Stomach is within normal limits. Appendix appears normal. Descending and sigmoid diverticulosis. Multiple hyperdense, inspissated descending and sigmoid diverticula on precontrast imaging, which are without evidence of postcontrast enhancement and unchanged compared to prior examination. No specific nidus of intraluminal contrast extravasation identified. Lymphatic: No enlarged abdominal or pelvic lymph nodes. Reproductive: No mass or other significant abnormality. Other: Unchanged, large right lower quadrant hernia, possibly spigelian type, containing multiple nonobstructed loops of distal ileum and the cecum (series 6, image 150). Fat containing left inguinal hernia. No ascites. Musculoskeletal: No acute osseous findings. Unchanged lytic lesions throughout the vertebral bodies (series 13, image 67). IMPRESSION: 1. Descending and sigmoid diverticulosis. Multiple hyperdense, inspissated descending and sigmoid diverticula on precontrast imaging, which are without evidence of postcontrast enhancement and unchanged compared to prior examination. No specific nidus of intraluminal contrast extravasation identified to localize GI bleeding. 2. Status post right nephrectomy and adrenalectomy. No suspicious soft tissue or contrast enhancement in the nephrectomy bed. 3. Unchanged appearance of the posterior midportion and inferior pole of the left kidney status post partial nephrectomy. No suspicious contrast enhancement. 4. Unchanged cystic lesion of the dorsal pancreatic tail measuring 1.3 x 1.2 cm, likely a small pseudocyst or side branch intraductal papillary mucinous neoplasm. As  there is no observed increased risk of malignancy for such lesions smaller than 2 cm, no specific further follow-up or  characterization is required; continued attention with oncologic surveillance imaging for this patient with multiple known malignancies. 5. Unchanged lytic lesions throughout the vertebral bodies, consistent with history of multiple myeloma. 6. Unchanged, large right lower quadrant hernia containing multiple nonobstructed loops of distal ileum and the cecum. Electronically Signed   By: Jearld Lesch M.D.   On: 06/05/2023 11:08   CT HEAD WO CONTRAST ( )  Result Date: 06/05/2023 CLINICAL DATA:  Mental status change, unknown cause EXAM: CT HEAD WITHOUT CONTRAST TECHNIQUE: Contiguous axial images were obtained from the base of the skull through the vertex without intravenous contrast. RADIATION DOSE REDUCTION: This exam was performed according to the departmental dose-optimization program which includes automated exposure control, adjustment of the mA and/or kV according to patient size and/or use of iterative reconstruction technique. COMPARISON:  MR Brain 05/20/23 FINDINGS: Brain: Redemonstrated is a large petroclival mass, as seen on prior brain MRI, not significantly changed in size when accounting for differences in technique. Redemonstrated are multiple bilateral lucent calvarial lesions, which also appear unchanged for differences in size. No hemorrhage. No extra-axial fluid collection. No CT evidence of an acute cortical infarct. No hydrocephalus. Vascular: No hyperdense vessel or unexpected calcification. Skull: There are multiple lucent calvarial lesions which were present brain. Sinuses/Orbits: Moderate-to-large right-sided mastoid effusion. No middle ear effusion. Bilateral lens replacement. Bilateral maxillary, frontal anterior ethmoid air cells are clear. The posterior ethmoid air cells in the bilateral sphenoid sinuses are obliterated by a large mass in the petrous clival region. Other: None. IMPRESSION: 1. No acute intracranial abnormality. 2. Redemonstrated is a large petroclival mass, as seen on  prior brain MRI, not significantly changed in size when accounting for differences in technique. 3. Redemonstrated are multiple bilateral lucent calvarial lesions, which also appear unchanged for differences in size. Electronically Signed   By: Lorenza Cambridge M.D.   On: 06/05/2023 10:31   DG Chest Port 1 View  Result Date: 06/05/2023 CLINICAL DATA:  Questionable sepsis EXAM: PORTABLE CHEST 1 VIEW COMPARISON:  Chest radiograph 05/06/2023, CT chest/abdomen/pelvis 05/21/2023 FINDINGS: The cardiomediastinal silhouette is stable and within normal limits, allowing for rightward patient rotation. There is no focal consolidation or pulmonary edema. There is no pleural effusion or pneumothorax There is no acute osseous abnormality. The previously seen pathologic right clavicle fracture is not well seen on the current study. Remote right rib fractures are again noted. IMPRESSION: No radiographic evidence of acute cardiopulmonary process. Electronically Signed   By: Lesia Hausen M.D.   On: 06/05/2023 09:07     PERTINENT LAB RESULTS: CBC: Recent Labs    06/05/23 0827 06/05/23 0850 06/05/23 0851 06/06/23 0604  WBC 8.2  --   --  11.0*  HGB 6.1*   < > 5.8* 8.1*  HCT 19.6*   < > 17.0* 24.3*  PLT 136*  --   --  99*   < > = values in this interval not displayed.   CMET CMP     Component Value Date/Time   NA 137 06/06/2023 1253   K 4.4 06/06/2023 1253   CL 112 (H) 06/06/2023 1253   CO2 17 (L) 06/06/2023 1253   GLUCOSE 120 (H) 06/06/2023 1253   BUN 53 (H) 06/06/2023 1253   CREATININE 1.78 (H) 06/06/2023 1253   CALCIUM 7.0 (L) 06/06/2023 1253   PROT 4.6 (L) 06/05/2023 0827   ALBUMIN 1.9 (L) 06/05/2023 0827  AST 16 06/05/2023 0827   ALT 19 06/05/2023 0827   ALKPHOS 35 (L) 06/05/2023 0827   BILITOT 0.5 06/05/2023 0827   GFRNONAA 39 (L) 06/06/2023 1253    GFR Estimated Creatinine Clearance: 35.3 mL/min (A) (by C-G formula based on SCr of 1.78 mg/dL (H)). No results for input(s): "LIPASE",  "AMYLASE" in the last 72 hours. No results for input(s): "CKTOTAL", "CKMB", "CKMBINDEX", "TROPONINI" in the last 72 hours. Invalid input(s): "POCBNP" No results for input(s): "DDIMER" in the last 72 hours. Recent Labs    06/05/23 2044  HGBA1C 5.5   No results for input(s): "CHOL", "HDL", "LDLCALC", "TRIG", "CHOLHDL", "LDLDIRECT" in the last 72 hours. No results for input(s): "TSH", "T4TOTAL", "T3FREE", "THYROIDAB" in the last 72 hours.  Invalid input(s): "FREET3" No results for input(s): "VITAMINB12", "FOLATE", "FERRITIN", "TIBC", "IRON", "RETICCTPCT" in the last 72 hours. Coags: Recent Labs    06/05/23 0827  INR 1.3*   Microbiology: Recent Results (from the past 240 hour(s))  Resp panel by RT-PCR (RSV, Flu A&B, Covid) Anterior Nasal Swab     Status: None   Collection Time: 06/05/23  8:27 AM   Specimen: Anterior Nasal Swab  Result Value Ref Range Status   SARS Coronavirus 2 by RT PCR NEGATIVE NEGATIVE Final   Influenza A by PCR NEGATIVE NEGATIVE Final   Influenza B by PCR NEGATIVE NEGATIVE Final    Comment: (NOTE) The Xpert Xpress SARS-CoV-2/FLU/RSV plus assay is intended as an aid in the diagnosis of influenza from Nasopharyngeal swab specimens and should not be used as a sole basis for treatment. Nasal washings and aspirates are unacceptable for Xpert Xpress SARS-CoV-2/FLU/RSV testing.  Fact Sheet for Patients: BloggerCourse.com  Fact Sheet for Healthcare Providers: SeriousBroker.it  This test is not yet approved or cleared by the [place] FDA and has been authorized for detection and/or diagnosis of SARS-CoV-2 by FDA under an Emergency Use Authorization (EUA). This EUA will remain in effect (meaning this test can be used) for the duration of the COVID-19 declaration under Section 564(b)(1) of the Act, 21 U.S.C. section 360bbb-3(b)(1), unless the authorization is terminated or revoked.     Resp Syncytial Virus  by PCR NEGATIVE NEGATIVE Final    Comment: (NOTE) Fact Sheet for Patients: BloggerCourse.com  Fact Sheet for Healthcare Providers: SeriousBroker.it  This test is not yet approved or cleared by the [place] FDA and has been authorized for detection and/or diagnosis of SARS-CoV-2 by FDA under an Emergency Use Authorization (EUA). This EUA will remain in effect (meaning this test can be used) for the duration of the COVID-19 declaration under Section 564(b)(1) of the Act, 21 U.S.C. section 360bbb-3(b)(1), unless the authorization is terminated or revoked.  Performed at [facility], [address] [place], [place] [address]   Blood Culture (routine x 2)     Status: None (Preliminary result)   Collection Time: 06/05/23  8:27 AM   Specimen: BLOOD RIGHT ARM  Result Value Ref Range Status   Specimen Description BLOOD RIGHT ARM  Final   Special Requests   Final    BOTTLES DRAWN AEROBIC AND ANAEROBIC Blood Culture adequate volume   Culture   Final    NO GROWTH < 24 HOURS Performed at [facility], [address] [place], [place] [address]    Report Status PENDING  Incomplete  Urine Culture     Status: None (Preliminary result)   Collection Time: 06/05/23  3:03 PM   Specimen: Urine, Clean Catch  Result  Value Ref Range Status   Specimen Description URINE, CLEAN CATCH  Final   Special Requests   Final    NONE Reflexed from [id number] Performed at [facility], [address] [place], [place] [address]    Culture PENDING  Incomplete   Report Status PENDING  Incomplete  Blood Culture (routine x 2)     Status: None (Preliminary result)   Collection Time: 06/05/23  8:44 PM   Specimen: BLOOD RIGHT HAND  Result Value Ref Range Status   Specimen Description BLOOD RIGHT HAND  Final   Special Requests   Final    BOTTLES DRAWN AEROBIC AND ANAEROBIC Blood Culture adequate volume   Culture   Final    NO GROWTH < 12  HOURS Performed at [facility], [address], [place], [place] [address]    Report Status PENDING  Incomplete    FURTHER DISCHARGE INSTRUCTIONS:  Get Medicines reviewed and adjusted: Please take all your medications with you for your next visit with your Primary MD  Laboratory/radiological data: Please request your Primary MD to go over all hospital tests and procedure/radiological results at the follow up, please ask your Primary MD to get all Hospital records sent to his/her office.  In some cases, they will be blood work, cultures and biopsy results pending at the time of your discharge. Please request that your primary care M.D. goes through all the records of your hospital data and follows up on these results.  Also Note the following: If you experience worsening of your admission symptoms, develop shortness of breath, life threatening emergency, suicidal or homicidal thoughts you must seek medical attention immediately by calling 911 or calling your MD immediately  if symptoms less severe.  You must read complete instructions/literature along with all the possible adverse reactions/side effects for all the Medicines you take and that have been prescribed to you. Take any new Medicines after you have completely understood and accpet all the possible adverse reactions/side effects.   Do not drive when taking Pain medications or sleeping medications (Benzodaizepines)  Do not take more than prescribed Pain, Sleep and Anxiety Medications. It is not advisable to combine anxiety,sleep and pain medications without talking with your primary care practitioner  Special Instructions: If you have smoked or chewed Tobacco  in the last 2 yrs please stop smoking, stop any regular Alcohol  and or any Recreational drug use.  Wear Seat belts while driving.  Please note: You were cared for by a hospitalist during your hospital stay. Once you are discharged, your primary care physician will  handle any further medical issues. Please note that NO REFILLS for any discharge medications will be authorized once you are discharged, as it is imperative that you return to your primary care physician (or establish a relationship with a primary care physician if you do not have one) for your post hospital discharge needs so that they can reassess your need for medications and monitor your lab values.  Total Time spent coordinating discharge including counseling, education and face to face time equals greater than 30 minutes.  SignedJeoffrey Hatfield 06/06/2023 2:24 PM

## 2023-06-06 NOTE — Interval H&P Note (Signed)
History and Physical Interval Note:  06/06/2023 10:28 AM  Marcus Hatfield  has presented today for surgery, with the diagnosis of Melena.  The various methods of treatment have been discussed with the patient and family. After consideration of risks, benefits and other options for treatment, the patient has consented to  Procedure(s): ESOPHAGOGASTRODUODENOSCOPY (EGD) WITH PROPOFOL (N/A) as a surgical intervention.  The patient's history has been reviewed, patient examined, no change in status, stable for surgery.  I have reviewed the patient's chart and labs.  Questions were answered to the patient's satisfaction.     [name]

## 2023-06-06 NOTE — Progress Notes (Signed)
Discharge instructions discussed with patient and his spouse. Both verbalize an understanding. They deny complaints or concerns.

## 2023-06-06 NOTE — Op Note (Addendum)
[facility] Patient Name: Marcus Hatfield Procedure Date : 06/06/2023 MRN: [id number] Attending MD: Particia Lather , , [id number] Date of Birth: 04/27/1946 CSN: [id number] Age: 77 Admit Type: Inpatient Procedure:                Upper GI endoscopy Indications:              Melena Providers:                Madelyn Brunner" Valerie Roys, RN, Melany Guernsey, Technician Referring MD:             Hospitalist team Medicines:                Monitored Anesthesia Care Complications:            No immediate complications. Estimated Blood Loss:     Estimated blood loss was minimal. Procedure:                Pre-Anesthesia Assessment:                           - Prior to the procedure, a History and Physical                            was performed, and patient medications and                            allergies were reviewed. The patient's tolerance of                            previous anesthesia was also reviewed. The risks                            and benefits of the procedure and the sedation                            options and risks were discussed with the patient.                            All questions were answered, and informed consent                            was obtained. Prior Anticoagulants: The patient has                            taken no anticoagulant or antiplatelet agents. ASA                            Grade Assessment: III - A patient with severe                            systemic disease. After reviewing the risks and  benefits, the patient was deemed in satisfactory                            condition to undergo the procedure.                           After obtaining informed consent, the endoscope was                            passed under direct vision. Throughout the                            procedure, the patient's blood pressure, pulse, and                             oxygen saturations were monitored continuously. The                            [id number]-H190 ([id number]) Olympus endoscope was introduced                            through the mouth, and advanced to the second part                            of duodenum. The upper GI endoscopy was                            accomplished without difficulty. The patient                            tolerated the procedure well. Scope In: Scope Out: Findings:      The examined esophagus was normal.      Two 4 to 7 mm sessile polyps with no bleeding and no stigmata of recent       bleeding were found in the gastric body. Biopsies were taken with a cold       forceps for histology.      Localized inflammation characterized by congestion (edema), erosions and       erythema was found in the gastric body and in the gastric antrum.       Biopsies were taken with a cold forceps for histology.      Three non-bleeding cratered duodenal ulcers with a clean ulcer base       (Forrest Class III) were found in the duodenal bulb and in the second       portion of the duodenum. The largest lesion was 10 mm in largest       dimension. Biopsies were taken with a cold forceps for histology due to       patient's history of multiple myeloma.      A medium non-bleeding diverticulum was found in the second portion of       the duodenum.      A single 15 mm submucosal nodule (suspected to be benign lipoma) was       found in the second portion of the duodenum. Biopsies were taken with a       cold forceps for histology. Impression:               -  Normal esophagus.                           - Two gastric polyps. Biopsied.                           - Gastritis. Biopsied.                           - Non-bleeding duodenal ulcers with a clean ulcer                            base (Forrest Class III). Biopsied.                           - Non-bleeding duodenal diverticulum.                           - Submucosal nodule found in the  duodenum. Biopsied. Recommendation:           - Return patient to hospital ward for ongoing care.                           - It is suspected that the patient's melena and                            hemoglobin drop is due to duodenal ulcers.                           - Await pathology results.                           - Use a proton pump inhibitor PO BID for 10 weeks,                            then daily.                           - Avoid NSAIDs.                           - Recommend iron supplementation.                           - We will arrange for GI clinic follow up.                           - The findings and recommendations were discussed                            with the patient. Procedure Code(s):        --- Professional ---                           [phone], Esophagogastroduodenoscopy, flexible,  transoral; with biopsy, single or multiple Diagnosis Code(s):        --- Professional ---                           K31.7, Polyp of stomach and duodenum                           K29.70, Gastritis, unspecified, without bleeding                           K26.9, Duodenal ulcer, unspecified as acute or                            chronic, without hemorrhage or perforation                           K31.89, Other diseases of stomach and duodenum                           K92.1, Melena (includes Hematochezia) CPT copyright 2022 American Medical Association. All rights reserved. The codes documented in this report are preliminary and upon coder review may  be revised to meet current compliance requirements. Dr Particia Lather "[name]" [name],  06/06/2023 11:02:21 AM Number of Addenda: 0

## 2023-06-06 NOTE — Progress Notes (Signed)
Patient to endo via wheelchair. Family present at bedside.

## 2023-06-06 NOTE — Anesthesia Procedure Notes (Signed)
Procedure Name: MAC Date/Time: 06/06/2023 10:34 AM  Performed by: Marena Chancy, CRNAPre-anesthesia Checklist: Patient identified, Emergency Drugs available, Suction available, Patient being monitored and Timeout performed Patient Re-evaluated:Patient Re-evaluated prior to induction Oxygen Delivery Method: Simple face mask

## 2023-06-06 NOTE — Care Management CC44 (Signed)
Condition Code 44 Documentation Completed  Patient Details  Name: Marcus Hatfield MRN: [id number] Date of Birth: 01/12/1946   Condition Code 44 given:    Patient signature on Condition Code 44 notice:    Documentation of 2 MD's agreement:    Code 44 added to claim:       Gordy Clement, RN 06/06/2023, 3:21 PM

## 2023-06-06 NOTE — Anesthesia Preprocedure Evaluation (Addendum)
Anesthesia Evaluation  Patient identified by MRN, date of birth, ID band Patient awake    Reviewed: Allergy & Precautions, NPO status , Patient's Chart, lab work & pertinent test results, reviewed documented beta blocker date and time   Airway Mallampati: II  TM Distance: >3 FB     Dental  (+) Caps, Missing, Dental Advisory Given   Pulmonary neg pulmonary ROS   Pulmonary exam normal breath sounds clear to auscultation       Cardiovascular hypertension, Pt. on medications and Pt. on home beta blockers Normal cardiovascular exam Rhythm:Regular Rate:Normal  EKG 06/05/23 NSR, LAD, PAC's, inc RBBB pattern   Neuro/Psych negative neurological ROS  negative psych ROS   GI/Hepatic Neg liver ROS,,,Melena   Endo/Other  negative endocrine ROS    Renal/GU Renal InsufficiencyRenal disease   Prostate disorder    Musculoskeletal  (+) Arthritis , Osteoarthritis,    Abdominal   Peds  Hematology  (+) Blood dyscrasia, anemia Thrombocytopenia- Plt 99k Hx/o multiple myeloma FU at [facility]   Anesthesia Other Findings   Reproductive/Obstetrics                             Anesthesia Physical Anesthesia Plan  ASA: 3  Anesthesia Plan: MAC   Post-op Pain Management: Minimal or no pain anticipated   Induction: Intravenous  PONV Risk Score and Plan: 1 and Treatment may vary due to age or medical condition and Propofol infusion  Airway Management Planned: Natural Airway, Nasal Cannula and Simple Face Mask  Additional Equipment: None  Intra-op Plan:   Post-operative Plan:   Informed Consent: I have reviewed the patients History and Physical, chart, labs and discussed the procedure including the risks, benefits and alternatives for the proposed anesthesia with the patient or authorized representative who has indicated his/her understanding and acceptance.     Dental advisory given  Plan Discussed with:  Anesthesiologist and CRNA  Anesthesia Plan Comments:         Anesthesia Quick Evaluation

## 2023-06-06 NOTE — TOC Transition Note (Signed)
Transition of Care [facility]) - CM/SW Discharge Note   Patient Details  Name: Marcus Hatfield MRN: [id number] Date of Birth: 1946-03-21  [facility] CM/SW Contact:  Gordy Clement, RN Phone Number: 06/06/2023, 3:35 PM   Clinical Narrative:    Patient to dc to home today with Wife- No TOC needs . Code 44 letter given        Barriers to Discharge: Continued Medical Work up   Patient Goals and CMS Choice CMS Medicare.gov Compare Post Acute Care list provided to::  (NONE) Choice offered to / list presented to : NA  Discharge Placement                         Discharge Plan and Services Additional resources added to the After Visit Summary for   In-house Referral: NA Discharge Planning Services: NA Post Acute Care Choice:  (TBD)          DME Arranged: N/A (TBD  Patient doen't require any DME at home currently)         HH Arranged: NA (Needs TBD) HH Agency: NA        Social Determinants of Health (SDOH) Interventions SDOH Screenings   Food Insecurity: Patient Declined (06/05/2023)  Housing: Patient Declined (06/05/2023)  Transportation Needs: Patient Declined (06/05/2023)  Utilities: Patient Declined (06/05/2023)  Tobacco Use: Low Risk  (06/06/2023)     Readmission Risk Interventions     No data to display

## 2023-06-07 ENCOUNTER — Encounter: Payer: Self-pay | Admitting: Hematology

## 2023-06-07 ENCOUNTER — Telehealth: Payer: Self-pay

## 2023-06-07 ENCOUNTER — Encounter: Payer: Self-pay | Admitting: Radiation Oncology

## 2023-06-07 LAB — MULTIPLE MYELOMA PANEL, SERUM
Albumin SerPl Elph-Mcnc: 3.3 g/dL (ref 2.9–4.4)
Albumin/Glob SerPl: 0.8 (ref 0.7–1.7)
Alpha 1: 0.2 g/dL (ref 0.0–0.4)
Alpha2 Glob SerPl Elph-Mcnc: 0.9 g/dL (ref 0.4–1.0)
B-Globulin SerPl Elph-Mcnc: 0.9 g/dL (ref 0.7–1.3)
Gamma Glob SerPl Elph-Mcnc: 2.2 g/dL — ABNORMAL HIGH (ref 0.4–1.8)
Globulin, Total: 4.2 g/dL — ABNORMAL HIGH (ref 2.2–3.9)
IgA: 82 mg/dL (ref 61–437)
IgG (Immunoglobin G), Serum: 2894 mg/dL — ABNORMAL HIGH (ref 603–1613)
IgM (Immunoglobulin M), Srm: 13 mg/dL — ABNORMAL LOW (ref 15–143)
M Protein SerPl Elph-Mcnc: 1.9 g/dL — ABNORMAL HIGH
Total Protein ELP: 7.5 g/dL (ref 6.0–8.5)

## 2023-06-07 LAB — CULTURE, BLOOD (ROUTINE X 2): Special Requests: ADEQUATE

## 2023-06-07 NOTE — Consult Note (Signed)
   [facility] CM Inpatient Consult   06/07/2023  Andrei Roldan [birth year] [id number]  [facility] [[facility]]  Accountable Care Organization [ACO] Patient: Marcus Hatfield  [facility] Liaison remote coverage review for patient admitted to [facility] with follow up phone call as patient transitioned home last evening was observation post procedure  Primary Care Provider:  Chilton Greathouse, MD, with [facility]   Patient screened for less than 30 days re-hospitalization and due to high risk score for unplanned readmission to assess for potential [facility]  [[facility]] Care Management service needs for post hospital transition for care coordination.  Review of patient's electronic medical record reveals patient had transitioned home last evening.  Was able to leave a HIPAA appropriate voicemail message requesting a return call.   Plan:  Telephonic outreach today, awaiting return call.  Referral request for community care coordination: pending patient's return call  Of note, [facility] Care Management/Population Health does not replace or interfere with any arrangements made by the Inpatient Transition of Care team.  For questions contact:   Charlesetta Shanks, RN BSN CCM [facility]  [phone] business mobile phone Toll free office [phone]  *Concierge Line  [phone] Fax number: [phone] [email or web address]

## 2023-06-07 NOTE — Radiation Completion Notes (Signed)
Patient Name: WITTEN, STENERSON MRN: [id number] Date of Birth: [birth year] Referring Physician: Wyvonnia Lora, M.D. Date of Service: 2023-06-07 Radiation Oncologist: Dorothy Puffer, M.D. [facility]                             RADIATION ONCOLOGY END OF TREATMENT NOTE     Diagnosis: C90.00 Multiple myeloma not having achieved remission Intent: Palliative     ==========DELIVERED PLANS==========  First Treatment Date: 2023-05-22 - Last Treatment Date: 2023-06-02   Plan Name: Brain Site: Cranium Technique: 3D Mode: Photon Dose Per Fraction: 3 Gy Prescribed Dose (Delivered / Prescribed): 30 Gy / 30 Gy Prescribed Fxs (Delivered / Prescribed): 10 / 10   Plan Name: SCV_R Site: Sclav-RT Technique: Isodose Plan Mode: Photon Dose Per Fraction: 3 Gy Prescribed Dose (Delivered / Prescribed): 30 Gy / 30 Gy Prescribed Fxs (Delivered / Prescribed): 10 / 10     ==========ON TREATMENT VISIT DATES========== 2023-05-26, 2023-06-02     ==========UPCOMING VISITS==========       ==========APPENDIX - ON TREATMENT VISIT NOTES==========   See weekly On Treatment Notes in Epic for details.

## 2023-06-07 NOTE — Progress Notes (Signed)
  Radiation Oncology         [phone]) [phone] ________________________________  Name: Marcus Hatfield MRN: [id number]  Date: 06/02/2023  DOB: 28-Jun-1946  End of Treatment Note  Diagnosis:   Multiple Myeloma with involvement in the base of skull and medial right clavicle  Indication for treatment:   palliative       Radiation treatment dates:   05/22/23-06/02/23  Site/planned dose:   The base of skull metastasis and medial right clavicle were both treated to 30 Gy in 10 fractions  Narrative: The patient tolerated radiation and developed some fatigue, but his vision improved during radiotherapy. His steroids will be tapered as well.   Plan: The patient will receive a call in about one month from the radiation oncology department. He will continue follow up with Dr. Candise Che as well.      Osker Mason, PAC

## 2023-06-07 NOTE — Telephone Encounter (Signed)
Oral Chemotherapy Pharmacist Encounter   Received call from [facility] that they have been unable to reach patient to set up Revlimid shipment (likely due to patient being admitted 6/17 and discharged home on 6/18). Called and LVM for patient to please call [facility] at [phone] to set up shipment of new medication.   Left Oral Chemotherapy Clinic phone number for call back if patient has any questions. We will continue to follow up that patient receives medication.   Lenord Carbo, PharmD, BCPS, BCOP Hematology/Oncology Clinical Pharmacist [facility] Oral Chemotherapy Navigation Clinics [phone] 06/07/2023 2:02 PM

## 2023-06-07 NOTE — Progress Notes (Signed)
  Radiation Oncology         [phone]) [phone] ________________________________  Name: Marcus Hatfield  WRU:[id number]  Date of Service: 06/07/23  DOB: 1946/12/03   Steroid Taper Instructions   You currently have a prescription for Dexamethasone 4 mg Tablets.    Beginning 06/08/23: Take 1/2 of a tablet (which is 2 mg) twice a day  Beginning 06/12/23: Take 1/2 of a tablet (which is 2 mg) once a day  Beginning 06/16/23: Take 1/2 of a tablet (which is 2 mg) every other day and stop on 06/20/23.   Please call our office if you have any headaches, visual changes, uncontrolled movements, extremity weakness, nausea or vomiting.

## 2023-06-07 NOTE — Telephone Encounter (Signed)
Patient has been scheduled for the fist available with [name] /APP = Thursday, 8-8 at 2:20 pm w/ Colleen. MyChart message sent to pt

## 2023-06-07 NOTE — Telephone Encounter (Signed)
-----   Message from [name], MD sent at 06/06/2023 10:59 AM EDT ----- Cleon Gustin, please arrange for GI clinic follow up with me or APP in 4-6 weeks for hospital follow up of anemia. Thanks.

## 2023-06-08 ENCOUNTER — Encounter (HOSPITAL_COMMUNITY): Payer: Self-pay | Admitting: Internal Medicine

## 2023-06-08 NOTE — Telephone Encounter (Signed)
Oral Chemotherapy Pharmacist Encounter   2nd attempt made to reach patient to update him on status of Revlimid and provide information about setting up the shipment with [facility]. No answer. Left voicemail for patient with [facility] phone number (tele: [phone]) to call and set up shipment of Revlimid.   2nd call made to patient's wife, who was with [email or web address].I informed her of the above and she stated they would try and call [facility] today to set up shipment of Revlimid.   No other questions or concerns at this time.   Lenord Carbo, PharmD, BCPS, [facility] Hematology/Oncology Clinical Pharmacist [facility] Oral Chemotherapy Navigation Clinics [phone] 06/08/2023 10:46 AM

## 2023-06-09 ENCOUNTER — Encounter: Payer: Self-pay | Admitting: Hematology

## 2023-06-09 ENCOUNTER — Other Ambulatory Visit: Payer: Self-pay

## 2023-06-09 DIAGNOSIS — C9002 Multiple myeloma in relapse: Secondary | ICD-10-CM

## 2023-06-09 LAB — CULTURE, BLOOD (ROUTINE X 2): Special Requests: ADEQUATE

## 2023-06-09 LAB — SURGICAL PATHOLOGY

## 2023-06-09 NOTE — Telephone Encounter (Signed)
Oral Chemotherapy Pharmacist Encounter  I spoke with patient for overview of: Revlimid (lenalidomide) for the treatment of multiple myeloma in conjunction with daratumumab and dexamethasone, planned duration until disease progression or unacceptable drug toxicity.   Counseled patient on administration, dosing, side effects, monitoring, drug-food interactions, safe handling, storage, and disposal.  Patient will take Revlimid 10mg  capsules, 1 capsule by mouth once daily, without regard to food, with a full glass of water.  Revlimid will be given 21 days on, 7 days off, repeat every 28 days.  Revlimid start date: 06/13/23 PM  Adverse effects of Revlimid include but are not limited to: nausea, GI upset, rash, fatigue, and decreased blood counts.   VZV PPX: Patient has picked up acyclovir and stated he has already started this.  VTE PPX: Given patient's recent admission for GI bleed on 06/05/23, will defer decision for patient starting ASA 81 mg to Dr. Candise Che.   Reviewed with patient importance of keeping a medication schedule and plan for any missed doses. No barriers to medication adherence identified.  Medication reconciliation performed and medication/allergy list updated.  Insurance authorization for Revlimid has been obtained.  Revlimid prescription is being dispensed from [facility] as it is a limited distribution medication. This will be delivered to patient's home on 06/09/23.  All questions answered.  Mr. Fontenette voiced understanding and appreciation.   Medication education handout placed in mail for patient. Patient knows to call the office with questions or concerns. Oral Chemotherapy Clinic phone number provided to patient.   Lenord Carbo, PharmD, BCPS, [facility] Hematology/Oncology Clinical Pharmacist [facility] Oral Chemotherapy Navigation Clinics [phone] 06/09/2023 10:56 AM

## 2023-06-10 LAB — CULTURE, BLOOD (ROUTINE X 2): Culture: NO GROWTH

## 2023-06-12 ENCOUNTER — Encounter: Payer: Self-pay | Admitting: Hematology

## 2023-06-12 NOTE — Progress Notes (Signed)
Called pt to introduce myself as his Dance movement psychotherapist and to discuss the [employer].  I left a msg requesting he return my call if he's interested in apply for the grant.

## 2023-06-13 ENCOUNTER — Inpatient Hospital Stay: Payer: Medicare HMO

## 2023-06-13 ENCOUNTER — Inpatient Hospital Stay: Payer: Medicare HMO | Admitting: Hematology

## 2023-06-13 ENCOUNTER — Other Ambulatory Visit: Payer: Self-pay

## 2023-06-13 VITALS — BP 106/71 | HR 60 | Temp 98.0°F | Wt 173.0 lb

## 2023-06-13 DIAGNOSIS — J320 Chronic maxillary sinusitis: Secondary | ICD-10-CM | POA: Diagnosis not present

## 2023-06-13 DIAGNOSIS — C9002 Multiple myeloma in relapse: Secondary | ICD-10-CM

## 2023-06-13 DIAGNOSIS — C9000 Multiple myeloma not having achieved remission: Secondary | ICD-10-CM | POA: Diagnosis not present

## 2023-06-13 DIAGNOSIS — K769 Liver disease, unspecified: Secondary | ICD-10-CM | POA: Diagnosis not present

## 2023-06-13 DIAGNOSIS — G4700 Insomnia, unspecified: Secondary | ICD-10-CM | POA: Diagnosis not present

## 2023-06-13 DIAGNOSIS — Z5111 Encounter for antineoplastic chemotherapy: Secondary | ICD-10-CM | POA: Diagnosis not present

## 2023-06-13 DIAGNOSIS — M4856XA Collapsed vertebra, not elsewhere classified, lumbar region, initial encounter for fracture: Secondary | ICD-10-CM | POA: Diagnosis not present

## 2023-06-13 DIAGNOSIS — I700 Atherosclerosis of aorta: Secondary | ICD-10-CM | POA: Diagnosis not present

## 2023-06-13 DIAGNOSIS — Z7189 Other specified counseling: Secondary | ICD-10-CM

## 2023-06-13 DIAGNOSIS — N400 Enlarged prostate without lower urinary tract symptoms: Secondary | ICD-10-CM | POA: Diagnosis not present

## 2023-06-13 DIAGNOSIS — I129 Hypertensive chronic kidney disease with stage 1 through stage 4 chronic kidney disease, or unspecified chronic kidney disease: Secondary | ICD-10-CM | POA: Diagnosis not present

## 2023-06-13 DIAGNOSIS — N183 Chronic kidney disease, stage 3 unspecified: Secondary | ICD-10-CM | POA: Diagnosis not present

## 2023-06-13 LAB — CBC WITH DIFFERENTIAL (CANCER CENTER ONLY)
Abs Immature Granulocytes: 0.04 10*3/uL (ref 0.00–0.07)
Basophils Absolute: 0 10*3/uL (ref 0.0–0.1)
Basophils Relative: 0 %
Eosinophils Absolute: 0 10*3/uL (ref 0.0–0.5)
Eosinophils Relative: 0 %
HCT: 24.8 % — ABNORMAL LOW (ref 39.0–52.0)
Hemoglobin: 8.1 g/dL — ABNORMAL LOW (ref 13.0–17.0)
Immature Granulocytes: 1 %
Lymphocytes Relative: 4 %
Lymphs Abs: 0.3 10*3/uL — ABNORMAL LOW (ref 0.7–4.0)
MCH: 31.8 pg (ref 26.0–34.0)
MCHC: 32.7 g/dL (ref 30.0–36.0)
MCV: 97.3 fL (ref 80.0–100.0)
Monocytes Absolute: 0.5 10*3/uL (ref 0.1–1.0)
Monocytes Relative: 8 %
Neutro Abs: 6.3 10*3/uL (ref 1.7–7.7)
Neutrophils Relative %: 87 %
Platelet Count: 123 10*3/uL — ABNORMAL LOW (ref 150–400)
RBC: 2.55 MIL/uL — ABNORMAL LOW (ref 4.22–5.81)
RDW: 20.3 % — ABNORMAL HIGH (ref 11.5–15.5)
WBC Count: 7.2 10*3/uL (ref 4.0–10.5)
nRBC: 0.8 % — ABNORMAL HIGH (ref 0.0–0.2)

## 2023-06-13 LAB — CMP (CANCER CENTER ONLY)
ALT: 24 U/L (ref 0–44)
AST: 11 U/L — ABNORMAL LOW (ref 15–41)
Albumin: 2.9 g/dL — ABNORMAL LOW (ref 3.5–5.0)
Alkaline Phosphatase: 43 U/L (ref 38–126)
Anion gap: 4 — ABNORMAL LOW (ref 5–15)
BUN: 33 mg/dL — ABNORMAL HIGH (ref 8–23)
CO2: 25 mmol/L (ref 22–32)
Calcium: 7.9 mg/dL — ABNORMAL LOW (ref 8.9–10.3)
Chloride: 106 mmol/L (ref 98–111)
Creatinine: 1.8 mg/dL — ABNORMAL HIGH (ref 0.61–1.24)
GFR, Estimated: 39 mL/min — ABNORMAL LOW (ref >60)
Glucose, Bld: 102 mg/dL — ABNORMAL HIGH (ref 70–99)
Potassium: 4.4 mmol/L (ref 3.5–5.1)
Sodium: 135 mmol/L (ref 135–145)
Total Bilirubin: 0.3 mg/dL (ref 0.3–1.2)
Total Protein: 5.7 g/dL — ABNORMAL LOW (ref 6.5–8.1)

## 2023-06-13 LAB — SAMPLE TO BLOOD BANK

## 2023-06-13 LAB — TYPE AND SCREEN
ABO/RH(D): B POS
Antibody Screen: NEGATIVE

## 2023-06-13 MED ORDER — FAMOTIDINE 20 MG PO TABS
20.0000 mg | ORAL_TABLET | Freq: Once | ORAL | Status: AC
  Administered 2023-06-13: 20 mg via ORAL
  Filled 2023-06-13: qty 1

## 2023-06-13 MED ORDER — DEXAMETHASONE 4 MG PO TABS
20.0000 mg | ORAL_TABLET | Freq: Once | ORAL | Status: AC
  Administered 2023-06-13: 20 mg via ORAL
  Filled 2023-06-13: qty 5

## 2023-06-13 MED ORDER — DIPHENHYDRAMINE HCL 25 MG PO CAPS
50.0000 mg | ORAL_CAPSULE | Freq: Once | ORAL | Status: AC
  Administered 2023-06-13: 50 mg via ORAL
  Filled 2023-06-13: qty 2

## 2023-06-13 MED ORDER — DARATUMUMAB-HYALURONIDASE-FIHJ 1800-30000 MG-UT/15ML ~~LOC~~ SOLN
1800.0000 mg | Freq: Once | SUBCUTANEOUS | Status: AC
  Administered 2023-06-13: 1800 mg via SUBCUTANEOUS
  Filled 2023-06-13: qty 15

## 2023-06-13 MED ORDER — MONTELUKAST SODIUM 10 MG PO TABS
10.0000 mg | ORAL_TABLET | Freq: Once | ORAL | Status: AC
  Administered 2023-06-13: 10 mg via ORAL
  Filled 2023-06-13: qty 1

## 2023-06-13 MED ORDER — ACETAMINOPHEN 325 MG PO TABS
650.0000 mg | ORAL_TABLET | Freq: Once | ORAL | Status: AC
  Administered 2023-06-13: 650 mg via ORAL
  Filled 2023-06-13: qty 2

## 2023-06-13 MED ORDER — FAMOTIDINE IN NACL 20-0.9 MG/50ML-% IV SOLN
20.0000 mg | Freq: Once | INTRAVENOUS | Status: DC
Start: 2023-06-13 — End: 2023-06-13

## 2023-06-13 NOTE — Patient Instructions (Addendum)
[facility]  Discharge Instructions: Thank you for choosing [facility] to provide your oncology and hematology care.   If you have a lab appointment with the Cancer Center, please go directly to the Cancer Center and check in at the registration area.   Wear comfortable clothing and clothing appropriate for easy access to any Portacath or PICC line.   We strive to give you quality time with your provider. You may need to reschedule your appointment if you arrive late (15 or more minutes).  Arriving late affects you and other patients whose appointments are after yours.  Also, if you miss three or more appointments without notifying the office, you may be dismissed from the clinic at the provider's discretion.      For prescription refill requests, have your pharmacy contact our office and allow 72 hours for refills to be completed.    Today you received the following chemotherapy and/or immunotherapy agents Darzalex Faspro      To help prevent nausea and vomiting after your treatment, we encourage you to take your nausea medication as directed.  BELOW ARE SYMPTOMS THAT SHOULD BE REPORTED IMMEDIATELY: *FEVER GREATER THAN 100.4 F (38 C) OR HIGHER *CHILLS OR SWEATING *NAUSEA AND VOMITING THAT IS NOT CONTROLLED WITH YOUR NAUSEA MEDICATION *UNUSUAL SHORTNESS OF BREATH *UNUSUAL BRUISING OR BLEEDING *URINARY PROBLEMS (pain or burning when urinating, or frequent urination) *BOWEL PROBLEMS (unusual diarrhea, constipation, pain near the anus) TENDERNESS IN MOUTH AND THROAT WITH OR WITHOUT PRESENCE OF ULCERS (sore throat, sores in mouth, or a toothache) UNUSUAL RASH, SWELLING OR PAIN  UNUSUAL VAGINAL DISCHARGE OR ITCHING   Items with * indicate a potential emergency and should be followed up as soon as possible or go to the Emergency Department if any problems should occur.  Please show the CHEMOTHERAPY ALERT CARD or IMMUNOTHERAPY ALERT CARD at  check-in to the Emergency Department and triage nurse.  Should you have questions after your visit or need to cancel or reschedule your appointment, please contact [facility]  Dept: [phone]  and follow the prompts.  Office hours are 8:00 a.m. to 4:30 p.m. Monday - Friday. Please note that voicemails left after 4:00 p.m. may not be returned until the following business day.  We are closed weekends and major holidays. You have access to a nurse at all times for urgent questions. Please call the main number to the clinic Dept: [phone] and follow the prompts.   For any non-urgent questions, you may also contact your provider using MyChart. We now offer e-Visits for anyone 18 and older to request care online for non-urgent symptoms. For details visit [email or web address].   Also download the MyChart app! Go to the app store, search "MyChart", open the app, select [facility], and log in with your MyChart username and password.  

## 2023-06-14 DIAGNOSIS — J320 Chronic maxillary sinusitis: Secondary | ICD-10-CM | POA: Diagnosis not present

## 2023-06-14 DIAGNOSIS — I700 Atherosclerosis of aorta: Secondary | ICD-10-CM | POA: Diagnosis not present

## 2023-06-14 DIAGNOSIS — K769 Liver disease, unspecified: Secondary | ICD-10-CM | POA: Diagnosis not present

## 2023-06-14 DIAGNOSIS — C9000 Multiple myeloma not having achieved remission: Secondary | ICD-10-CM | POA: Diagnosis not present

## 2023-06-14 DIAGNOSIS — I129 Hypertensive chronic kidney disease with stage 1 through stage 4 chronic kidney disease, or unspecified chronic kidney disease: Secondary | ICD-10-CM | POA: Diagnosis not present

## 2023-06-14 DIAGNOSIS — Z5111 Encounter for antineoplastic chemotherapy: Secondary | ICD-10-CM | POA: Diagnosis not present

## 2023-06-14 DIAGNOSIS — G4700 Insomnia, unspecified: Secondary | ICD-10-CM | POA: Diagnosis not present

## 2023-06-14 DIAGNOSIS — M4856XA Collapsed vertebra, not elsewhere classified, lumbar region, initial encounter for fracture: Secondary | ICD-10-CM | POA: Diagnosis not present

## 2023-06-14 DIAGNOSIS — N183 Chronic kidney disease, stage 3 unspecified: Secondary | ICD-10-CM | POA: Diagnosis not present

## 2023-06-14 DIAGNOSIS — N400 Enlarged prostate without lower urinary tract symptoms: Secondary | ICD-10-CM | POA: Diagnosis not present

## 2023-06-16 ENCOUNTER — Other Ambulatory Visit: Payer: Self-pay

## 2023-06-16 ENCOUNTER — Encounter: Payer: Self-pay | Admitting: Hematology

## 2023-06-16 DIAGNOSIS — C9002 Multiple myeloma in relapse: Secondary | ICD-10-CM

## 2023-06-16 LAB — PRETREATMENT RBC PHENOTYPE

## 2023-06-16 NOTE — Telephone Encounter (Signed)
-----   Message from Yehuda Budd, RN sent at 06/13/2023  3:47 PM EDT ----- Regarding: Darzalex Faspro/Dr. Candise Che Hello, Pt received first Darzalex today (6/25) and tolerated well. Thx

## 2023-06-16 NOTE — Telephone Encounter (Signed)
Called pt & left message for return call to let us know how he did with his recent treatment.

## 2023-06-19 ENCOUNTER — Other Ambulatory Visit: Payer: Self-pay

## 2023-06-19 ENCOUNTER — Inpatient Hospital Stay: Payer: Medicare HMO | Attending: Hematology

## 2023-06-19 ENCOUNTER — Inpatient Hospital Stay: Payer: Medicare HMO | Admitting: Hematology

## 2023-06-19 ENCOUNTER — Inpatient Hospital Stay: Payer: Medicare HMO

## 2023-06-19 VITALS — BP 105/67 | HR 63 | Temp 97.9°F | Resp 18 | Ht 70.0 in | Wt 171.5 lb

## 2023-06-19 DIAGNOSIS — I129 Hypertensive chronic kidney disease with stage 1 through stage 4 chronic kidney disease, or unspecified chronic kidney disease: Secondary | ICD-10-CM | POA: Diagnosis not present

## 2023-06-19 DIAGNOSIS — M129 Arthropathy, unspecified: Secondary | ICD-10-CM | POA: Insufficient documentation

## 2023-06-19 DIAGNOSIS — M25511 Pain in right shoulder: Secondary | ICD-10-CM | POA: Diagnosis not present

## 2023-06-19 DIAGNOSIS — Z85528 Personal history of other malignant neoplasm of kidney: Secondary | ICD-10-CM | POA: Insufficient documentation

## 2023-06-19 DIAGNOSIS — Z5112 Encounter for antineoplastic immunotherapy: Secondary | ICD-10-CM | POA: Diagnosis present

## 2023-06-19 DIAGNOSIS — Z791 Long term (current) use of non-steroidal anti-inflammatories (NSAID): Secondary | ICD-10-CM | POA: Diagnosis not present

## 2023-06-19 DIAGNOSIS — C9000 Multiple myeloma not having achieved remission: Secondary | ICD-10-CM | POA: Insufficient documentation

## 2023-06-19 DIAGNOSIS — I700 Atherosclerosis of aorta: Secondary | ICD-10-CM | POA: Diagnosis not present

## 2023-06-19 DIAGNOSIS — Z7189 Other specified counseling: Secondary | ICD-10-CM

## 2023-06-19 DIAGNOSIS — J320 Chronic maxillary sinusitis: Secondary | ICD-10-CM | POA: Insufficient documentation

## 2023-06-19 DIAGNOSIS — Z7982 Long term (current) use of aspirin: Secondary | ICD-10-CM | POA: Diagnosis not present

## 2023-06-19 DIAGNOSIS — R682 Dry mouth, unspecified: Secondary | ICD-10-CM | POA: Insufficient documentation

## 2023-06-19 DIAGNOSIS — C9002 Multiple myeloma in relapse: Secondary | ICD-10-CM

## 2023-06-19 DIAGNOSIS — M7989 Other specified soft tissue disorders: Secondary | ICD-10-CM | POA: Insufficient documentation

## 2023-06-19 DIAGNOSIS — G4700 Insomnia, unspecified: Secondary | ICD-10-CM | POA: Diagnosis not present

## 2023-06-19 DIAGNOSIS — Z79899 Other long term (current) drug therapy: Secondary | ICD-10-CM | POA: Insufficient documentation

## 2023-06-19 DIAGNOSIS — Z79624 Long term (current) use of inhibitors of nucleotide synthesis: Secondary | ICD-10-CM | POA: Diagnosis not present

## 2023-06-19 DIAGNOSIS — E871 Hypo-osmolality and hyponatremia: Secondary | ICD-10-CM | POA: Insufficient documentation

## 2023-06-19 DIAGNOSIS — Z7952 Long term (current) use of systemic steroids: Secondary | ICD-10-CM | POA: Diagnosis not present

## 2023-06-19 DIAGNOSIS — N183 Chronic kidney disease, stage 3 unspecified: Secondary | ICD-10-CM | POA: Diagnosis not present

## 2023-06-19 DIAGNOSIS — Z7961 Long term (current) use of immunomodulator: Secondary | ICD-10-CM | POA: Insufficient documentation

## 2023-06-19 LAB — COMPREHENSIVE METABOLIC PANEL
ALT: 17 U/L (ref 0–44)
AST: 10 U/L — ABNORMAL LOW (ref 15–41)
Albumin: 2.7 g/dL — ABNORMAL LOW (ref 3.5–5.0)
Alkaline Phosphatase: 41 U/L (ref 38–126)
Anion gap: 4 — ABNORMAL LOW (ref 5–15)
BUN: 26 mg/dL — ABNORMAL HIGH (ref 8–23)
CO2: 26 mmol/L (ref 22–32)
Calcium: 7.6 mg/dL — ABNORMAL LOW (ref 8.9–10.3)
Chloride: 106 mmol/L (ref 98–111)
Creatinine, Ser: 1.74 mg/dL — ABNORMAL HIGH (ref 0.61–1.24)
GFR, Estimated: 40 mL/min — ABNORMAL LOW (ref >60)
Glucose, Bld: 78 mg/dL (ref 70–99)
Potassium: 4.6 mmol/L (ref 3.5–5.1)
Sodium: 136 mmol/L (ref 135–145)
Total Bilirubin: 0.4 mg/dL (ref 0.3–1.2)
Total Protein: 5.2 g/dL — ABNORMAL LOW (ref 6.5–8.1)

## 2023-06-19 LAB — CBC WITH DIFFERENTIAL (CANCER CENTER ONLY)
Abs Immature Granulocytes: 0.01 10*3/uL (ref 0.00–0.07)
Basophils Absolute: 0 10*3/uL (ref 0.0–0.1)
Basophils Relative: 0 %
Eosinophils Absolute: 0 10*3/uL (ref 0.0–0.5)
Eosinophils Relative: 1 %
HCT: 25.9 % — ABNORMAL LOW (ref 39.0–52.0)
Hemoglobin: 8.4 g/dL — ABNORMAL LOW (ref 13.0–17.0)
Immature Granulocytes: 0 %
Lymphocytes Relative: 5 %
Lymphs Abs: 0.2 10*3/uL — ABNORMAL LOW (ref 0.7–4.0)
MCH: 32.2 pg (ref 26.0–34.0)
MCHC: 32.4 g/dL (ref 30.0–36.0)
MCV: 99.2 fL (ref 80.0–100.0)
Monocytes Absolute: 0.2 10*3/uL (ref 0.1–1.0)
Monocytes Relative: 7 %
Neutro Abs: 2.6 10*3/uL (ref 1.7–7.7)
Neutrophils Relative %: 87 %
Platelet Count: 88 10*3/uL — ABNORMAL LOW (ref 150–400)
RBC: 2.61 MIL/uL — ABNORMAL LOW (ref 4.22–5.81)
RDW: 20 % — ABNORMAL HIGH (ref 11.5–15.5)
WBC Count: 3.1 10*3/uL — ABNORMAL LOW (ref 4.0–10.5)
nRBC: 0 % (ref 0.0–0.2)

## 2023-06-19 MED ORDER — DEXAMETHASONE 4 MG PO TABS
20.0000 mg | ORAL_TABLET | Freq: Once | ORAL | Status: AC
  Administered 2023-06-19: 20 mg via ORAL

## 2023-06-19 MED ORDER — MONTELUKAST SODIUM 10 MG PO TABS
10.0000 mg | ORAL_TABLET | Freq: Once | ORAL | Status: AC
  Administered 2023-06-19: 10 mg via ORAL
  Filled 2023-06-19: qty 1

## 2023-06-19 MED ORDER — FAMOTIDINE 20 MG PO TABS
20.0000 mg | ORAL_TABLET | Freq: Once | ORAL | Status: AC
  Administered 2023-06-19: 20 mg via ORAL
  Filled 2023-06-19: qty 1

## 2023-06-19 MED ORDER — DARATUMUMAB-HYALURONIDASE-FIHJ 1800-30000 MG-UT/15ML ~~LOC~~ SOLN
1800.0000 mg | Freq: Once | SUBCUTANEOUS | Status: AC
  Administered 2023-06-19: 1800 mg via SUBCUTANEOUS
  Filled 2023-06-19: qty 15

## 2023-06-19 MED ORDER — FUROSEMIDE 20 MG PO TABS: 10.0000 mg | ORAL_TABLET | ORAL | Status: AC

## 2023-06-19 MED ORDER — DEXAMETHASONE 4 MG PO TABS
20.0000 mg | ORAL_TABLET | Freq: Once | ORAL | Status: DC
  Filled 2023-06-19: qty 5

## 2023-06-19 MED ORDER — DIPHENHYDRAMINE HCL 25 MG PO CAPS
50.0000 mg | ORAL_CAPSULE | Freq: Once | ORAL | Status: AC
  Administered 2023-06-19: 50 mg via ORAL
  Filled 2023-06-19: qty 2

## 2023-06-19 MED ORDER — ACETAMINOPHEN 325 MG PO TABS
650.0000 mg | ORAL_TABLET | Freq: Once | ORAL | Status: AC
  Administered 2023-06-19: 650 mg via ORAL
  Filled 2023-06-19: qty 2

## 2023-06-19 NOTE — Progress Notes (Signed)
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  Patient was seen by me as an inpatient on 05/20/2023.  He noted that his vision may be slightly better with the steroid. Had some mild insomnia but it was not too bothersome. Has been seen by radiation oncology and is going to be set up for CT simulation and to start radiation 05/21/2023.  Did have a CT chest abdomen pelvis without contrast to evaluate for any other soft tissue metastatic disease and whole-body skeletal survey to determine other overt progression and to rule out the possibility of further source of metastatic disease.  Today, he is accompanied by two family members. He reports that he is feeling well overall. He reports that his vision has improved and returned to baseline. He denies any glares in his vision which were previously present. He is able to count fingers and notes improved sight of colors on television.   His p.o. intake is normal and he notes drinking 4 16oz bottles daily. He denies any fevers, chills, night sweats, new back pain, fatigue, posterior neck pain, or abdominal pain.  He reports some nausea with previously taking Revlimid. Patient reports that he has an upcoming dental appointment.  INTERVAL HISTORY: Marcus Hatfield is a wonderful 77 y.o. 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   Patient was last seen by me on 06/02/2023 and he was doing well overall.   Patient is accompanied by his wife during this visit. He notes he has been doing well overall without any new or severe  medical concerns since our last visit. He notes that his vision has significantly improved after his radiation treatment. He denies any vision problems during this visit.    He denies any new infection issues, fever, headaches, dental pain, chills, night sweats, new bone pain, back pain, abdominal pain, chest pain. He does complain of occasional bilateral leg swelling and mild bump near left side of his face.   Patient notes he started Revlimid since our last visit. He has not taken Aspirin due to recent GI bleed..  Patient notes he tolerated his cycle 1 day 1 of his treatment well without any new or severe toxicities.   MEDICAL HISTORY:  Past Medical History:  Diagnosis Date   Arthritis    CKD (chronic kidney disease) stage 3, GFR 30-59 ml/min (HCC) 20015   Creat 1.9   Hypertension    Multiple myeloma (HCC) 2016   [facility]   Prostate disorder 02/2017    SURGICAL HISTORY: Past Surgical History:  Procedure Laterality Date   EYE SURGERY Bilateral 01/30/2018   KNEE SURGERY     over ten years ago   NEPHRECTOMY Right 03/2015   ROBOTIC ASSITED PARTIAL NEPHRECTOMY Left 07/07/2021   Procedure: XI ROBOTIC ASSITED PARTIAL NEPHRECTOMY;  Surgeon: [name], MD;  Location: [facility];  Service: Urology;  Laterality: Left;  5 HRS   XI ROBOTIC ASSISTED SIMPLE PROSTATECTOMY N/A 07/07/2021   Procedure: XI ROBOTIC ASSISTED SIMPLE PROSTATECTOMY;  Surgeon: [name], MD;  Location: [facility];  Service: Urology;  Laterality: N/A;    SOCIAL HISTORY: Social  History   Socioeconomic History   Marital status: Married    Spouse name: Museum/gallery curator   Number of children: 2   Years of education: Not on file   Highest education level: Not on file  Occupational History   Occupation: Retired  Tobacco Use   Smoking status: Never   Smokeless tobacco: Never  Vaping Use   Vaping Use: Never used  Substance and Sexual Activity   Alcohol use: No   Drug use: No   Sexual activity: Not on file   Other Topics Concern   Not on file  Social History Narrative   Not on file   Social Determinants of Health   Financial Resource Strain: Not on file  Food Insecurity: No Food Insecurity (05/20/2023)   Hunger Vital Sign    Worried About Running Out of Food in the Last Year: Never true    Ran Out of Food in the Last Year: Never true  Transportation Needs: No Transportation Needs (05/20/2023)   PRAPARE - Administrator, Civil Service (Medical): No    Lack of Transportation (Non-Medical): No  Physical Activity: Not on file  Stress: Not on file  Social Connections: Not on file  Intimate Partner Violence: Not At Risk (05/20/2023)   Humiliation, Afraid, Rape, and Kick questionnaire    Fear of Current or Ex-Partner: No    Emotionally Abused: No    Physically Abused: No    Sexually Abused: No    FAMILY HISTORY: Family History  Problem Relation Age of Onset   Diabetes Father    Hypertension Brother     ALLERGIES:  is allergic to penicillins, latex, and tape.  MEDICATIONS:  Current Outpatient Medications  Medication Sig Dispense Refill   acyclovir (ZOVIRAX) 400 MG tablet Take 1 tablet (400 mg total) by mouth 2 (two) times daily. 60 tablet 11   amLODipine (NORVASC) 10 MG tablet Take 10 mg by mouth daily.     aspirin EC 81 MG tablet Take 81 mg by mouth daily.     carvedilol (COREG) 6.25 MG tablet Take 6.25 mg by mouth 2 (two) times daily with a meal.     dexamethasone (DECADRON) 4 MG tablet Take 1 tablet (4 mg total) by mouth 2 (two) times daily with a meal. 60 tablet 0   dexamethasone (DECADRON) 4 MG tablet Take 5 tablets (20 mg total) by mouth once a week. Take the day after darzalex faspro. Take with breakfast. 20 tablet 11   diclofenac Sodium (VOLTAREN) 1 % GEL Apply 2 g topically 4 (four) times daily.     docusate sodium (COLACE) 100 MG capsule Take 1 capsule (100 mg total) by mouth daily as needed for mild constipation.     ferrous sulfate 325 (65 FE) MG tablet Take 1  tablet (325 mg total) by mouth 2 (two) times daily as needed (iron).     finasteride (PROSCAR) 5 MG tablet Take 5 mg by mouth daily.     fluticasone (FLONASE) 50 MCG/ACT nasal spray Place 2 sprays into both nostrils daily as needed for allergies.     furosemide (LASIX) 20 MG tablet Take 20 mg by mouth daily.     lactulose (CHRONULAC) 10 GM/15ML solution Take 10 g by mouth daily as needed for constipation.  5   lenalidomide (REVLIMID) 10 MG capsule Take for 21 days on, 7 days off, repeat every 28 days. 21 capsule 0   ondansetron (ZOFRAN) 8 MG tablet Take 1 tablet (8 mg total) by  mouth every 8 (eight) hours as needed for nausea or vomiting. 30 tablet 1   prochlorperazine (COMPAZINE) 10 MG tablet Take 1 tablet (10 mg total) by mouth every 6 (six) hours as needed for nausea or vomiting. 30 tablet 1   tamsulosin (FLOMAX) 0.4 MG CAPS capsule Take 1 capsule (0.4 mg total) by mouth daily after supper. 30 capsule 1   No current facility-administered medications for this visit.    REVIEW OF SYSTEMS:    10 Point review of Systems was done is negative except as noted above.  PHYSICAL EXAMINATION: ECOG PERFORMANCE STATUS: 2 - Symptomatic, <50% confined to bed VSS GENERAL:alert, in no acute distress and comfortable SKIN: no acute rashes, no significant lesions EYES: conjunctiva are pink and non-injected, sclera anicteric OROPHARYNX: MMM, no exudates, no oropharyngeal erythema or ulceration NECK: supple, no JVD LYMPH:  no palpable lymphadenopathy in the cervical, axillary or inguinal regions LUNGS: clear to auscultation b/l with normal respiratory effort HEART: regular rate & rhythm ABDOMEN:  normoactive bowel sounds , non tender, not distended. Extremity: no pedal edema PSYCH: alert & oriented x 3 with fluent speech NEURO: no focal motor/sensory deficits  LABORATORY DATA:  I have reviewed the data as listed .    Latest Ref Rng & Units 06/19/2023   10:49 AM 06/13/2023   10:48 AM 06/06/2023     6:04 AM  CBC  WBC 4.0 - 10.5 K/uL 3.1  7.2  11.0   Hemoglobin 13.0 - 17.0 g/dL 8.4  8.1  8.1   Hematocrit 39.0 - 52.0 % 25.9  24.8  24.3   Platelets 150 - 400 K/uL 88  123  99    .    Latest Ref Rng & Units 06/19/2023   10:49 AM 06/13/2023   10:48 AM 06/06/2023   12:53 PM  CMP  Glucose 70 - 99 mg/dL 78  161  096   BUN 8 - 23 mg/dL 26  33  53   Creatinine 0.61 - 1.24 mg/dL 0.45  4.09  8.11   Sodium 135 - 145 mmol/L 136  135  137   Potassium 3.5 - 5.1 mmol/L 4.6  4.4  4.4   Chloride 98 - 111 mmol/L 106  106  112   CO2 22 - 32 mmol/L 26  25  17    Calcium 8.9 - 10.3 mg/dL 7.6  7.9  7.0   Total Protein 6.5 - 8.1 g/dL 5.2  5.7    Total Bilirubin 0.3 - 1.2 mg/dL 0.4  0.3    Alkaline Phos 38 - 126 U/L 41  43    AST 15 - 41 U/L 10  11    ALT 0 - 44 U/L 17  24       RADIOGRAPHIC STUDIES: I have personally reviewed the radiological images as listed and agreed with the findings in the report. CT CHEST ABDOMEN PELVIS WO CONTRAST  Result Date: 05/21/2023 CLINICAL DATA:  Renal cell carcinoma and myeloma; * Tracking Code: BO * EXAM: CT CHEST, ABDOMEN AND PELVIS WITHOUT CONTRAST TECHNIQUE: Multidetector CT imaging of the chest, abdomen and pelvis was performed following the standard protocol without IV contrast. RADIATION DOSE REDUCTION: This exam was performed according to the departmental dose-optimization program which includes automated exposure control, adjustment of the mA and/or kV according to patient size and/or use of iterative reconstruction technique. COMPARISON:  CT abdomen and pelvis dated May 05, 2023 FINDINGS: CT CHEST FINDINGS Cardiovascular: Normal heart size. No pericardial effusion. Normal caliber thoracic aorta with  mild atherosclerotic disease. Mediastinum/Nodes: Esophagus and thyroid are unremarkable. No enlarged lymph nodes seen in the chest. Lungs/Pleura: Central airways are patent. Bibasilar atelectasis. No consolidation, pleural effusion or pneumothorax. Musculoskeletal:  Numerous scattered lucent lesions seen involving the ribs, sternum, thoracic spine and bilateral clavicles. A large lucent lesion of the T6 vertebral body involvement of the posterior cortex, extension into the spinal canal can not be excluded. CT ABDOMEN PELVIS FINDINGS Hepatobiliary: Scattered small low-attenuation liver lesions which are too small to accurately characterize. Hypoattenuating lesions of the central liver located on series 2, image 51 and hepatic dome on image 46, previously characterized as benign hemangiomas. Gallbladder is unremarkable. Pancreas: Unremarkable. No pancreatic ductal dilatation or surrounding inflammatory changes. Spleen: Normal in size without focal abnormality. Adrenals/Urinary Tract: No adrenal nodules. Prior right nephrectomy. Left adrenal gland is unremarkable. No left hydronephrosis. Status post partial left nephrectomy. Simple appearing left renal cyst, no specific follow-up imaging is recommended. Bladder is unremarkable. Stomach/Bowel: Stomach is within normal limits. Appendix appears normal. Mild diverticulosis. No evidence of bowel wall thickening, distention, or inflammatory changes. Vascular/Lymphatic: No significant vascular findings are present. No enlarged abdominal or pelvic lymph nodes. Reproductive: Moderate prostatomegaly status post TURP. Other: Large hernia of the lateral right abdominal wall containing nondilated lead loops of small and large bowel. No abdominopelvic ascites. No intra-abdominal free air. Musculoskeletal: Numerous lytic osseous lesions seen throughout the thoracic spine and pelvis. Mild compression fracture of L4 with no evidence of retropulsion, unchanged when compared with the prior exam. IMPRESSION: 1. Numerous lytic osseous lesions, consistent with patient's history of multiple myeloma. A large lucent lesion of the T6 vertebral body involves the posterior cortex, extension into the spinal canal can not be excluded. Consider further  evaluation with contrast-enhanced MRI of the thoracic spine. 2. Status post right nephrectomy no evidence of recurrent mass in the right nephrectomy bed. 3. No evidence of soft tissue metastatic disease in the chest, abdomen or pelvis. 4. Mild aortic Atherosclerosis (ICD10-I70.0). Electronically Signed   By: Allegra Lai M.D.   On: 05/21/2023 20:22   DG Bone Survey Met  Result Date: 05/21/2023 CLINICAL DATA:  Multiple myeloma EXAM: METASTATIC BONE SURVEY COMPARISON:  None Available. FINDINGS: There are multiple small round lucencies of varying sizes in calvarium. Sella appears largest unusual in size. There are few radiolucent lesions of varying sizes in the shaft of right humerus. Possible small low-density focus is seen in the mid shaft of right clavicle. There are radiolucencies in the distal shaft of left humerus. Small faint lucencies are seen in the proximal shaft of right radius and distal shaft of right ulna. Possible small faint lucency is seen in the distal shaft of left ulna. Degenerative changes are noted with bony spurs in cervical and thoracic spine. There are no focal infiltrates in the lung fields. Deformities in the posterolateral aspects of right fifth and sixth ribs suggest old healed fractures. Possible small faint lucencies are seen in the posterior aspects of left fourth and sixth ribs. Few small lucencies are noted in left iliac bone and proximal right femur. There is linear lucency in the proximal shaft of right femur. Small faint lucency is seen in the distal shaft of right femur. There are few low-density lesions of varying sizes in the shaft of left femur. IMPRESSION: There are multiple radiolucencies of varying sizes in axial and appendicular skeleton as described in the body of the report consistent with multiple myeloma lesions. Electronically Signed   By: Harlan Stains.D.  On: 05/21/2023 19:36   MR Brain W and Wo Contrast  Addendum Date: 05/20/2023   ADDENDUM REPORT:  05/20/2023 15:48 ADDENDUM: Lytic lesions have been identified previously with this patient. 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 Electronically Signed   By: Marin Roberts M.D.   On: 05/20/2023 15:48   Result Date: 05/20/2023 CLINICAL DATA:  Visual is turbinates to the left eye. Abnormal CT scan. EXAM: MRI HEAD WITHOUT AND WITH CONTRAST TECHNIQUE: Multiplanar, multiecho pulse sequences of the brain and surrounding structures were obtained without and with intravenous contrast. CONTRAST:  8mL GADAVIST GADOBUTROL 1 MMOL/ML IV SOLN COMPARISON:  CT of the head and maxillofacial 02/25/2021. FINDINGS: Brain: No acute infarct, hemorrhage, or mass lesion is present. Mild atrophy and white matter changes are likely within normal limits for age. The ventricles are of normal size. No significant extraaxial fluid collection is present. Deep brain nuclei are within normal limits. Enhancing osseous tumor extends to the anterior margin of the right IAC. Internal auditory canals are otherwise within normal limits bilaterally. Inner ear structures are within normal limits. Vascular: Insert normal flow Skull and upper cervical spine: A slightly heterogeneous enhancing mass lesion is again noted at the skull base. This extends along the petrous ridge bilaterally, greater on the right. Tumor surrounds the right internal carotid artery without definite stenosis. There is some heterogeneity to the tumor on precontrast T2 weighted imaging. The lesion measures 7.8 x 5.6 x 3.3 cm. Multiple additional enhancing lesions are present throughout the skull. Lesion in the anterior right frontal skull measures 11 mm on image 110 of series 17. A lesion in the left parietal skull on image 107 of series 17 measures 11 mm. Multiple other smaller lesions are present as well. Sinuses/Orbits: Minimal mucosal thickening scratched at minimal fluid is present in the right maxillary sinus. The paranasal sinuses are  otherwise clear. A right mastoid effusion is present. The tumor impacts the right eustachian tube. Other: IMPRESSION: 1. 7.8 x 5.6 x 3.3 cm enhancing mass lesion at the skull base extending along the petrous ridge bilaterally, greater on the right. 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 There are some intrinsic T2 signal scratched at there is some intrinsic T2 signal and chordoma still considered. However, given the multiple other enhancing lesions throughout the skull, this most likely represents metastatic disease. 2. No acute intracranial abnormality. 3. Mild atrophy and white matter changes are likely within normal limits for age. 4. Right mastoid effusion secondary to obstruction of the eustachian tube. 5. Minimal right maxillary sinus disease. Electronically Signed: By: Marin Roberts M.D. On: 05/20/2023 15:33   MR ORBITS W WO CONTRAST  Result Date: 05/20/2023 CLINICAL DATA:  Visual disturbance to the left eye for 3 weeks. Vision loss, monocular. Skull base mass. EXAM: MRI OF THE ORBITS WITHOUT AND WITH CONTRAST TECHNIQUE: Multiplanar, multi-echo pulse sequences of the orbits and surrounding structures were acquired including fat saturation techniques, before and after intravenous contrast administration. CONTRAST:  8mL GADAVIST GADOBUTROL 1 MMOL/ML IV SOLN COMPARISON:  CT head without contrast 05/20/2023. MR head without and with contrast 05/20/2023 FINDINGS: Enhancing skull base tumor centered in the clivus measuring 7.8 x 5.3 x 3.7 cm. Tumor extends along the petrous apex, right greater than left. The tumor surrounds the petrous right ICA. Tumor extends into the floor of the sella abuts the cavernous sinus bilaterally. Tumor invades the posterior ethmoid air cells and sphenoid sinuses bilaterally. Tumor extends to the left orbital  apex and likely impacts the left optic nerve. The optic chiasm is within normal limits. Tumor extends into the nasal cavity more prominently on the left.  Multiple additional foci of enhancement are present within the skull. Orbits: Bilateral lens replacements are present. The globes are within normal limits. The optic nerve is within normal limits within the canal bilaterally. Extraocular muscles are unremarkable. The superior ophthalmic vein is symmetric. Visualized sinuses: Tumor extends into the posterior ethmoid air cells and sphenoid sinuses as described. A right mastoid effusion is secondary to obstruction. Soft tissues: Extracranial soft tissues are unremarkable. Limited intracranial: Visualized intracranial contents are within normal limits. IMPRESSION: 1. Enhancing skull base tumor centered in the clivus measuring 7.8 x 5.3 x 3.7 cm. Tumor extends along the petrous apex, right greater than left. The tumor surrounds the petrous right ICA. Tumor extends into the floor of the sella abuts the cavernous sinus bilaterally. Tumor invades the posterior ethmoid air cells and sphenoid sinuses bilaterally. Tumor extends to the left orbital apex and likely impacts the left optic nerve. Based on the initial CT, consideration of chordoma was made. Although there is some intrinsic T2 signal and heterogeneous enhancement, the multiple other enhancing lesions suggest this is most likely metastatic disease. Tumor extending into the nasal cavity may be assessable for biopsy. 2. Tumor extends into the nasal cavity more prominently on the left. 3. Right mastoid effusion is secondary to obstruction. Electronically Signed   By: Marin Roberts M.D.   On: 05/20/2023 15:41   CT Head Wo Contrast  Result Date: 05/20/2023 CLINICAL DATA:  Vision loss. EXAM: CT HEAD WITHOUT CONTRAST TECHNIQUE: Contiguous axial images were obtained from the base of the skull through the vertex without intravenous contrast. RADIATION DOSE REDUCTION: This exam was performed according to the departmental dose-optimization program which includes automated exposure control, adjustment of the mA and/or  kV according to patient size and/or use of iterative reconstruction technique. COMPARISON:  02/25/2021. FINDINGS: Brain: Large, hyperattenuating mass enlarges and erodes the clivus, extending through the sella turcica, obliterating the sphenoid sinuses and extending into the posterior ethmoid air cells. Mass diffusely involves the cavernous sinuses, extending more laterally on the left than on the right. The mass broadly contacts the optic nerves from the orbital apices to anterior to the optic chiasm. Mass measures 6.2 cm from right to left, 4 cm from superior to inferior and 5.2 cm anterior to posterior. No other extra-axial masses. No intra-axial masses. No evidence of an infarct and no intracranial hemorrhage. No hydrocephalus. Vascular: No hyperdense vessel or unexpected calcification. Mass broadly involves both cavernous sinuses as detailed above. Skull: In addition to the clivus, mass erodes the petrous portions of both temporal bones as well as the sphenoid bone. Sinuses/Orbits: No abnormality of either globe. No intraorbital abnormality. Sinuses unaffected by the mass are clear. Other: None. IMPRESSION: 1. Large mass at the skull base, eroding and expanding the clivus, also involving the sphenoid bone and the petrous portions of both temporal bones. The mass broadly abuts both optic nerves between the chiasm and orbital apices. Mass also broadly involves both cavernous sinuses. 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 Recommend further assessment with brain MRI without and with contrast. 2. No acute finding. No evidence of an infarct, hydrocephalus or intracranial hemorrhage. Electronically Signed   By: Amie Portland M.D.   On: 05/20/2023 10:22   DG Chest 2 View  Result  Date: 05/06/2023 CLINICAL DATA:  77 year old male with history of right clavicular pain. EXAM: CHEST - 2 VIEW COMPARISON:  Chest x-ray 05/02/2023. FINDINGS:  Mildly displaced fracture of the medial right clavicle. Lucency in the region of the medial right clavicle suggesting an underlying bony lesion. Old healed fractures of the lateral right fifth and sixth ribs with abundant callus formation. Lung volumes are normal. No consolidative airspace disease. No pleural effusions. No pneumothorax. No pulmonary nodule or mass noted. Pulmonary vasculature and the cardiomediastinal silhouette are within normal limits. IMPRESSION: 1.  No radiographic evidence of acute cardiopulmonary disease. 2. Probable pathologic fracture of the medial right clavicle better demonstrated on dedicated right clavicular radiographs. 3. Old healed fractures of the lateral right fifth and sixth ribs. Electronically Signed   By: Trudie Reed M.D.   On: 05/06/2023 09:56   DG Clavicle Right  Result Date: 05/06/2023 CLINICAL DATA:  77 year old male with history of pain in the region of the right clavicle. EXAM: RIGHT CLAVICLE - 2+ VIEWS COMPARISON:  No priors. FINDINGS: Multiple lucent lesions in the right clavicle, which could reflect metastatic lesions in this patient with history of renal cell carcinoma, or could be myelomatous lesions. Mildly displaced fracture of the medial right clavicle noted with approximately 8 mm of inferior displacement of the distal aspect of the clavicle. IMPRESSION: 1. Mildly displaced likely pathologic fracture of the medial right clavicle, as above. Multiple additional lytic lesions in the clavicle could represent myelomatous lesions or metastatic lesions in this patient with history of renal cell carcinoma. Electronically Signed   By: Trudie Reed M.D.   On: 05/06/2023 09:54   DG Shoulder Right  Result Date: 05/06/2023 CLINICAL DATA:  Right shoulder pain. EXAM: RIGHT SHOULDER - 3 VIEW COMPARISON:  None Available. FINDINGS: No acute fracture or dislocation. Degenerative spurring at the acromioclavicular joint. Few subtle lucent areas are seen over the  humeral diaphysis (with endosteal scalloping) and at the mid clavicle. Remote fifth and sixth rib fractures with hypertrophic callus. There is known history of multiple myeloma with lesion seen on chest CT from 09/26/2022 IMPRESSION: 1. No acute finding. 2. Known myeloma. Electronically Signed   By: Tiburcio Pea M.D.   On: 05/06/2023 08:41    ASSESSMENT & PLAN:   77 year old male with   #1 Progressive IgG kappa myeloma: Initially diagnosed in 2016 as stage II disease with standard risk cytogenetics today. 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 Status post treatment as noted above. Last seen by Dr. Lalla Brothers at [facility] in January 2024 and noted to have progressive disease with increase in his IgG kappa M spike at 2.87 g/dL.   Recent light chains done on 05/20/2023 at [facility] showed increase in free kappa light chains to 603 with a kappa lambda ratio of 25 this is up from kappa light chains of 402 with a kappa lambda ratio of 12.6 on 01/03/2023. CBC shows relatively stable hemoglobin of 11.3 with normal WBC count and platelets BMP shows stable chronic kidney disease with a creatinine of 1.87.  No overt hypercalcemia with a calcium of 9   #2 recent right clavicular pain with x-ray on 05/06/2023 showing mildly displaced likely pathologic fracture of the medial right clavicle.  Multiple other lytic lesions noted.   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  CT head done today 05/20/2023 shows  Large mass at the skull base, eroding and expanding the  clivus, also involving the sphenoid bone and the petrous portions of both temporal bones. The mass broadly abuts both optic nerves between the chiasm and orbital apices. Mass also broadly involves both cavernous sinuses. 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 Recommend  further assessment with brain MRI without and with contrast.   MRI brain 05/20/2023 1. 7.8 x 5.6 x 3.3 cm enhancing mass lesion at the skull base extending along the petrous ridge bilaterally, greater on the right. 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 There are some intrinsic T2 signal scratched at there is some intrinsic T2 signal and chordoma still considered. However, given the multiple other enhancing lesions throughout the skull, this most likely represents metastatic disease. 2. No acute intracranial abnormality. 3. Mild atrophy and white matter changes are likely within normal limits for age. 4. Right mastoid effusion secondary to obstruction of the eustachian tube. 5. Minimal right maxillary sinus disease.   ADDENDUM: Lytic lesions have been identified previously with this patient. 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   #4 hyponatremia   #5 history of renal cell carcinoma right nephrectomy on 02/03/2016.  He received cryotherapy to his left renal mass in April 2017. Surgically resected by Dr. [name] at [facility] on July 07, 2021. 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 Tumor is limited to the kidney (pT1a).   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 CBC sows decreased WBC at 3.1 K, decreased hemoglobin at 8.4 g/dL, decreased hematocrit at 25.9%, and decreased platelets at 88 K. CMP shows elevated Bun at 26, elevated creatinine at 1.74, and decreased AST at 10.  -Recommended using compression socks to reduce leg swelling.  -Hold Revlimid medication because of blood clot risk since patient cannot current take ASA due to recent GI bleeding. -Recommend to get an appointment with Gastroenterologist.   -Patient tolerated his cycle 1 day 1 of his treatment well without any new or severe toxicities.  -Patient can proceed with cycle 1 day 8 of his treatment. We will cut down dexamethasone to 12 mg from next treatment.  -Educated patient on Zometa infusion.  -Patient will get an appointment with his dentist and get dental clearance for Zometa.  -We will wait for dental clearance for Zometa infusion.   FOLLOW-UP: Per integrated scheduling  The total time spent in the appointment was 32 minutes* .  All of the patient's questions were answered with apparent satisfaction. The patient knows to call the clinic with any problems, questions or concerns.   Wyvonnia Lora MD MS AAHIVMS [facility] Hematology/Oncology Physician [facility]  .*Total Encounter Time as defined by the Centers for Medicare and Medicaid Services includes, in addition to the face-to-face time of a patient visit (documented in the note above) non-face-to-face time: obtaining and reviewing outside history, ordering and reviewing medications, tests or procedures, care coordination (communications with other health care professionals or caregivers) and documentation in the medical record.   I, Ok Edwards, am acting as a Neurosurgeon for Wyvonnia Lora, MD. .I have reviewed the above documentation for accuracy and completeness, and I agree with the above. [name] MD

## 2023-06-19 NOTE — Progress Notes (Signed)
Per Dr. Candise Che, ok for treatment today with platelet count 88 K/uL

## 2023-06-19 NOTE — Patient Instructions (Signed)
[place] [place] [facility]  Discharge Instructions: Thank you for choosing [facility] to provide your oncology and hematology care.   If you have a lab appointment with the Cancer Center, please go directly to the Cancer Center and check in at the registration area.   Wear comfortable clothing and clothing appropriate for easy access to any Portacath or PICC line.   We strive to give you quality time with your provider. You may need to reschedule your appointment if you arrive late (15 or more minutes).  Arriving late affects you and other patients whose appointments are after yours.  Also, if you miss three or more appointments without notifying the office, you may be dismissed from the clinic at the provider's discretion.      For prescription refill requests, have your pharmacy contact our office and allow 72 hours for refills to be completed.    Today you received the following chemotherapy and/or immunotherapy agent: Daratumumab (Darzalex Faspro)   To help prevent nausea and vomiting after your treatment, we encourage you to take your nausea medication as directed.  BELOW ARE SYMPTOMS THAT SHOULD BE REPORTED IMMEDIATELY: *FEVER GREATER THAN 100.4 F (38 C) OR HIGHER *CHILLS OR SWEATING *NAUSEA AND VOMITING THAT IS NOT CONTROLLED WITH YOUR NAUSEA MEDICATION *UNUSUAL SHORTNESS OF BREATH *UNUSUAL BRUISING OR BLEEDING *URINARY PROBLEMS (pain or burning when urinating, or frequent urination) *BOWEL PROBLEMS (unusual diarrhea, constipation, pain near the anus) TENDERNESS IN MOUTH AND THROAT WITH OR WITHOUT PRESENCE OF ULCERS (sore throat, sores in mouth, or a toothache) UNUSUAL RASH, SWELLING OR PAIN  UNUSUAL VAGINAL DISCHARGE OR ITCHING   Items with * indicate a potential emergency and should be followed up as soon as possible or go to the Emergency Department if any problems should occur.  Please show the CHEMOTHERAPY ALERT CARD or IMMUNOTHERAPY  ALERT CARD at check-in to the Emergency Department and triage nurse.  Should you have questions after your visit or need to cancel or reschedule your appointment, please contact [facility] [facility]  Dept: [phone]  and follow the prompts.  Office hours are 8:00 a.m. to 4:30 p.m. Monday - Friday. Please note that voicemails left after 4:00 p.m. may not be returned until the following business day.  We are closed weekends and major holidays. You have access to a nurse at all times for urgent questions. Please call the main number to the clinic Dept: [phone] and follow the prompts.   For any non-urgent questions, you may also contact your provider using MyChart. We now offer e-Visits for anyone 18 and older to request care online for non-urgent symptoms. For details visit [email or web address].   Also download the MyChart app! Go to the app store, search "MyChart", open the app, select [place], and log in with your MyChart username and password.  Daratumumab; Hyaluronidase Injection What is this medication? DARATUMUMAB; HYALURONIDASE (dar a toom ue mab; hye al ur ON i dase) treats multiple myeloma, a type of bone marrow cancer. Daratumumab works by blocking a protein that causes cancer cells to grow and multiply. This helps to slow or stop the spread of cancer cells. Hyaluronidase works by increasing the absorption of other medications in the body to help them work better. This medication may also be used treat amyloidosis, a condition that causes the buildup of a protein (amyloid) in your body. It works by reducing the buildup of this protein, which decreases symptoms. It is a   combination medication that contains a monoclonal antibody. This medicine may be used for other purposes; ask your health care provider or pharmacist if you have questions. COMMON BRAND NAME(S): DARZALEX FASPRO What should I tell my care team before I take this medication? They need to  know if you have any of these conditions: Heart disease Infection, such as chickenpox, cold sores, herpes, hepatitis B Lung or breathing disease An unusual or allergic reaction to daratumumab, hyaluronidase, other medications, foods, dyes, or preservatives Pregnant or trying to get pregnant Breast-feeding How should I use this medication? This medication is injected under the skin. It is given by your care team in a hospital or clinic setting. Talk to your care team about the use of this medication in children. Special care may be needed. Overdosage: If you think you have taken too much of this medicine contact a poison control center or emergency room at once. NOTE: This medicine is only for you. Do not share this medicine with others. What if I miss a dose? Keep appointments for follow-up doses. It is important not to miss your dose. Call your care team if you are unable to keep an appointment. What may interact with this medication? Interactions have not been studied. This list may not describe all possible interactions. Give your health care provider a list of all the medicines, herbs, non-prescription drugs, or dietary supplements you use. Also tell them if you smoke, drink alcohol, or use illegal drugs. Some items may interact with your medicine. What should I watch for while using this medication? Your condition will be monitored carefully while you are receiving this medication. This medication can cause serious allergic reactions. To reduce your risk, your care team may give you other medication to take before receiving this one. Be sure to follow the directions from your care team. This medication can affect the results of blood tests to match your blood type. These changes can last for up to 6 months after the final dose. Your care team will do blood tests to match your blood type before you start treatment. Tell all of your care team that you are being treated with this medication  before receiving a blood transfusion. This medication can affect the results of some tests used to determine treatment response; extra tests may be needed to evaluate response. Talk to your care team if you wish to become pregnant or think you are pregnant. This medication can cause serious birth defects if taken during pregnancy and for 3 months after the last dose. A reliable form of contraception is recommended while taking this medication and for 3 months after the last dose. Talk to your care team about effective forms of contraception. Do not breast-feed while taking this medication. What side effects may I notice from receiving this medication? Side effects that you should report to your care team as soon as possible: Allergic reactions--skin rash, itching, hives, swelling of the face, lips, tongue, or throat Heart rhythm changes--fast or irregular heartbeat, dizziness, feeling faint or lightheaded, chest pain, trouble breathing Infection--fever, chills, cough, sore throat, wounds that don't heal, pain or trouble when passing urine, general feeling of discomfort or being unwell Infusion reactions--chest pain, shortness of breath or trouble breathing, feeling faint or lightheaded Sudden eye pain or change in vision such as blurry vision, seeing halos around lights, vision loss Unusual bruising or bleeding Side effects that usually do not require medical attention (report to your care team if they continue or are bothersome): Constipation   Diarrhea Fatigue Nausea Pain, tingling, or numbness in the hands or feet Swelling of the ankles, hands, or feet This list may not describe all possible side effects. Call your doctor for medical advice about side effects. You may report side effects to FDA at 1-800-FDA-1088. Where should I keep my medication? This medication is given in a hospital or clinic. It will not be stored at home. NOTE: This sheet is a summary. It may not cover all possible  information. If you have questions about this medicine, talk to your doctor, pharmacist, or health care provider.  2024 Elsevier/Gold Standard (2022-04-12 00:00:00)   

## 2023-06-20 ENCOUNTER — Inpatient Hospital Stay: Payer: Medicare HMO

## 2023-06-25 ENCOUNTER — Encounter: Payer: Self-pay | Admitting: Hematology

## 2023-06-27 ENCOUNTER — Inpatient Hospital Stay: Payer: Medicare HMO

## 2023-06-27 VITALS — BP 107/65 | HR 62 | Resp 18 | Wt 173.8 lb

## 2023-06-27 DIAGNOSIS — Z7189 Other specified counseling: Secondary | ICD-10-CM

## 2023-06-27 DIAGNOSIS — Z79624 Long term (current) use of inhibitors of nucleotide synthesis: Secondary | ICD-10-CM | POA: Diagnosis not present

## 2023-06-27 DIAGNOSIS — E871 Hypo-osmolality and hyponatremia: Secondary | ICD-10-CM | POA: Diagnosis not present

## 2023-06-27 DIAGNOSIS — Z791 Long term (current) use of non-steroidal anti-inflammatories (NSAID): Secondary | ICD-10-CM | POA: Diagnosis not present

## 2023-06-27 DIAGNOSIS — Z7961 Long term (current) use of immunomodulator: Secondary | ICD-10-CM | POA: Diagnosis not present

## 2023-06-27 DIAGNOSIS — N183 Chronic kidney disease, stage 3 unspecified: Secondary | ICD-10-CM | POA: Diagnosis not present

## 2023-06-27 DIAGNOSIS — R682 Dry mouth, unspecified: Secondary | ICD-10-CM | POA: Diagnosis not present

## 2023-06-27 DIAGNOSIS — C9000 Multiple myeloma not having achieved remission: Secondary | ICD-10-CM | POA: Diagnosis not present

## 2023-06-27 DIAGNOSIS — M129 Arthropathy, unspecified: Secondary | ICD-10-CM | POA: Diagnosis not present

## 2023-06-27 DIAGNOSIS — M7989 Other specified soft tissue disorders: Secondary | ICD-10-CM | POA: Diagnosis not present

## 2023-06-27 DIAGNOSIS — Z5112 Encounter for antineoplastic immunotherapy: Secondary | ICD-10-CM | POA: Diagnosis not present

## 2023-06-27 DIAGNOSIS — M25511 Pain in right shoulder: Secondary | ICD-10-CM | POA: Diagnosis not present

## 2023-06-27 DIAGNOSIS — Z85528 Personal history of other malignant neoplasm of kidney: Secondary | ICD-10-CM | POA: Diagnosis not present

## 2023-06-27 DIAGNOSIS — I129 Hypertensive chronic kidney disease with stage 1 through stage 4 chronic kidney disease, or unspecified chronic kidney disease: Secondary | ICD-10-CM | POA: Diagnosis not present

## 2023-06-27 DIAGNOSIS — J320 Chronic maxillary sinusitis: Secondary | ICD-10-CM | POA: Diagnosis not present

## 2023-06-27 DIAGNOSIS — Z7952 Long term (current) use of systemic steroids: Secondary | ICD-10-CM | POA: Diagnosis not present

## 2023-06-27 DIAGNOSIS — Z79899 Other long term (current) drug therapy: Secondary | ICD-10-CM | POA: Diagnosis not present

## 2023-06-27 DIAGNOSIS — Z7982 Long term (current) use of aspirin: Secondary | ICD-10-CM | POA: Diagnosis not present

## 2023-06-27 DIAGNOSIS — I700 Atherosclerosis of aorta: Secondary | ICD-10-CM | POA: Diagnosis not present

## 2023-06-27 DIAGNOSIS — G4700 Insomnia, unspecified: Secondary | ICD-10-CM | POA: Diagnosis not present

## 2023-06-27 DIAGNOSIS — C9002 Multiple myeloma in relapse: Secondary | ICD-10-CM

## 2023-06-27 LAB — CBC WITH DIFFERENTIAL (CANCER CENTER ONLY)
Abs Immature Granulocytes: 0 10*3/uL (ref 0.00–0.07)
Basophils Absolute: 0 10*3/uL (ref 0.0–0.1)
Basophils Relative: 0 %
Eosinophils Absolute: 0 10*3/uL (ref 0.0–0.5)
Eosinophils Relative: 0 %
HCT: 26.3 % — ABNORMAL LOW (ref 39.0–52.0)
Hemoglobin: 8.6 g/dL — ABNORMAL LOW (ref 13.0–17.0)
Lymphocytes Relative: 21 %
Lymphs Abs: 0.3 10*3/uL — ABNORMAL LOW (ref 0.7–4.0)
MCH: 32.5 pg (ref 26.0–34.0)
MCHC: 32.7 g/dL (ref 30.0–36.0)
MCV: 99.2 fL (ref 80.0–100.0)
Monocytes Absolute: 0.1 10*3/uL (ref 0.1–1.0)
Monocytes Relative: 8 %
Neutro Abs: 1.1 10*3/uL — ABNORMAL LOW (ref 1.7–7.7)
Neutrophils Relative %: 71 %
Platelet Count: 96 10*3/uL — ABNORMAL LOW (ref 150–400)
RBC: 2.65 MIL/uL — ABNORMAL LOW (ref 4.22–5.81)
RDW: 19.7 % — ABNORMAL HIGH (ref 11.5–15.5)
WBC Count: 1.5 10*3/uL — ABNORMAL LOW (ref 4.0–10.5)
nRBC: 1.9 % — ABNORMAL HIGH (ref 0.0–0.2)

## 2023-06-27 LAB — SAMPLE TO BLOOD BANK

## 2023-06-27 LAB — COMPREHENSIVE METABOLIC PANEL
ALT: 16 U/L (ref 0–44)
AST: 10 U/L — ABNORMAL LOW (ref 15–41)
Albumin: 2.8 g/dL — ABNORMAL LOW (ref 3.5–5.0)
Alkaline Phosphatase: 43 U/L (ref 38–126)
Anion gap: 8 (ref 5–15)
BUN: 33 mg/dL — ABNORMAL HIGH (ref 8–23)
CO2: 22 mmol/L (ref 22–32)
Calcium: 7.5 mg/dL — ABNORMAL LOW (ref 8.9–10.3)
Chloride: 109 mmol/L (ref 98–111)
Creatinine, Ser: 1.94 mg/dL — ABNORMAL HIGH (ref 0.61–1.24)
GFR, Estimated: 35 mL/min — ABNORMAL LOW (ref >60)
Glucose, Bld: 116 mg/dL — ABNORMAL HIGH (ref 70–99)
Potassium: 4.4 mmol/L (ref 3.5–5.1)
Sodium: 139 mmol/L (ref 135–145)
Total Bilirubin: 0.2 mg/dL — ABNORMAL LOW (ref 0.3–1.2)
Total Protein: 5.1 g/dL — ABNORMAL LOW (ref 6.5–8.1)

## 2023-06-27 MED ORDER — DIPHENHYDRAMINE HCL 25 MG PO CAPS
50.0000 mg | ORAL_CAPSULE | Freq: Once | ORAL | Status: AC
  Administered 2023-06-27: 50 mg via ORAL
  Filled 2023-06-27: qty 2

## 2023-06-27 MED ORDER — DEXAMETHASONE 4 MG PO TABS
12.0000 mg | ORAL_TABLET | Freq: Once | ORAL | Status: AC
  Administered 2023-06-27: 12 mg via ORAL
  Filled 2023-06-27: qty 3

## 2023-06-27 MED ORDER — ACETAMINOPHEN 325 MG PO TABS
650.0000 mg | ORAL_TABLET | Freq: Once | ORAL | Status: AC
  Administered 2023-06-27: 650 mg via ORAL
  Filled 2023-06-27: qty 2

## 2023-06-27 MED ORDER — DARATUMUMAB-HYALURONIDASE-FIHJ 1800-30000 MG-UT/15ML ~~LOC~~ SOLN
1800.0000 mg | Freq: Once | SUBCUTANEOUS | Status: AC
  Administered 2023-06-27: 1800 mg via SUBCUTANEOUS
  Filled 2023-06-27: qty 15

## 2023-06-27 MED ORDER — FAMOTIDINE 20 MG PO TABS
20.0000 mg | ORAL_TABLET | Freq: Once | ORAL | Status: AC
  Administered 2023-06-27: 20 mg via ORAL
  Filled 2023-06-27: qty 1

## 2023-06-27 MED ORDER — MONTELUKAST SODIUM 10 MG PO TABS
10.0000 mg | ORAL_TABLET | Freq: Once | ORAL | Status: AC
  Administered 2023-06-27: 10 mg via ORAL
  Filled 2023-06-27: qty 1

## 2023-06-27 NOTE — Progress Notes (Signed)
Per Dr Candise Che, ok to tx with ANC 1.1 and platelets 96.

## 2023-06-27 NOTE — Patient Instructions (Signed)
[facility]  Discharge Instructions: Thank you for choosing [facility] to provide your oncology and hematology care.   If you have a lab appointment with the Cancer Center, please go directly to the Cancer Center and check in at the registration area.   Wear comfortable clothing and clothing appropriate for easy access to any Portacath or PICC line.   We strive to give you quality time with your provider. You may need to reschedule your appointment if you arrive late (15 or more minutes).  Arriving late affects you and other patients whose appointments are after yours.  Also, if you miss three or more appointments without notifying the office, you may be dismissed from the clinic at the provider's discretion.      For prescription refill requests, have your pharmacy contact our office and allow 72 hours for refills to be completed.    Today you received the following chemotherapy and/or immunotherapy agents Darzalex Faspro      To help prevent nausea and vomiting after your treatment, we encourage you to take your nausea medication as directed.  BELOW ARE SYMPTOMS THAT SHOULD BE REPORTED IMMEDIATELY: *FEVER GREATER THAN 100.4 F (38 C) OR HIGHER *CHILLS OR SWEATING *NAUSEA AND VOMITING THAT IS NOT CONTROLLED WITH YOUR NAUSEA MEDICATION *UNUSUAL SHORTNESS OF BREATH *UNUSUAL BRUISING OR BLEEDING *URINARY PROBLEMS (pain or burning when urinating, or frequent urination) *BOWEL PROBLEMS (unusual diarrhea, constipation, pain near the anus) TENDERNESS IN MOUTH AND THROAT WITH OR WITHOUT PRESENCE OF ULCERS (sore throat, sores in mouth, or a toothache) UNUSUAL RASH, SWELLING OR PAIN  UNUSUAL VAGINAL DISCHARGE OR ITCHING   Items with * indicate a potential emergency and should be followed up as soon as possible or go to the Emergency Department if any problems should occur.  Please show the CHEMOTHERAPY ALERT CARD or IMMUNOTHERAPY ALERT CARD at  check-in to the Emergency Department and triage nurse.  Should you have questions after your visit or need to cancel or reschedule your appointment, please contact [place] [facility]  Dept: [phone]  and follow the prompts.  Office hours are 8:00 a.m. to 4:30 p.m. Monday - Friday. Please note that voicemails left after 4:00 p.m. may not be returned until the following business day.  We are closed weekends and major holidays. You have access to a nurse at all times for urgent questions. Please call the main number to the clinic Dept: [phone] and follow the prompts.   For any non-urgent questions, you may also contact your provider using MyChart. We now offer e-Visits for anyone 18 and older to request care online for non-urgent symptoms. For details visit [email or web address].   Also download the MyChart app! Go to the app store, search "MyChart", open the app, select [place], and log in with your MyChart username and password.  

## 2023-06-29 ENCOUNTER — Ambulatory Visit (HOSPITAL_COMMUNITY)
Admission: RE | Admit: 2023-06-29 | Discharge: 2023-06-29 | Disposition: A | Discharge status: Home / Self Care | Payer: Medicare HMO | Source: Ambulatory Visit | Attending: Hematology | Admitting: Hematology

## 2023-06-29 DIAGNOSIS — C9002 Multiple myeloma in relapse: Secondary | ICD-10-CM

## 2023-06-29 DIAGNOSIS — C9000 Multiple myeloma not having achieved remission: Secondary | ICD-10-CM | POA: Diagnosis not present

## 2023-06-29 LAB — GLUCOSE, CAPILLARY: Glucose-Capillary: 94 mg/dL (ref 70–99)

## 2023-06-29 MED ORDER — FLUDEOXYGLUCOSE F - 18 (FDG) INJECTION
8.5000 | Freq: Once | INTRAVENOUS | Status: AC
  Administered 2023-06-29: 8.5 via INTRAVENOUS

## 2023-07-03 ENCOUNTER — Other Ambulatory Visit: Payer: Self-pay

## 2023-07-03 DIAGNOSIS — Z7189 Other specified counseling: Secondary | ICD-10-CM

## 2023-07-03 DIAGNOSIS — C9002 Multiple myeloma in relapse: Secondary | ICD-10-CM

## 2023-07-03 MED ORDER — LENALIDOMIDE 10 MG PO CAPS
10.0000 mg | ORAL_CAPSULE | Freq: Every day | ORAL | 0 refills | Status: DC
Start: 2023-07-03 — End: 2023-10-06

## 2023-07-04 ENCOUNTER — Inpatient Hospital Stay: Payer: Medicare HMO

## 2023-07-04 ENCOUNTER — Other Ambulatory Visit: Payer: Self-pay

## 2023-07-04 ENCOUNTER — Other Ambulatory Visit: Payer: Self-pay | Admitting: Hematology

## 2023-07-04 ENCOUNTER — Encounter: Payer: Self-pay | Admitting: Hematology

## 2023-07-04 VITALS — BP 112/74 | HR 78 | Temp 99.0°F | Wt 174.8 lb

## 2023-07-04 DIAGNOSIS — Z7982 Long term (current) use of aspirin: Secondary | ICD-10-CM | POA: Diagnosis not present

## 2023-07-04 DIAGNOSIS — Z79624 Long term (current) use of inhibitors of nucleotide synthesis: Secondary | ICD-10-CM | POA: Diagnosis not present

## 2023-07-04 DIAGNOSIS — I129 Hypertensive chronic kidney disease with stage 1 through stage 4 chronic kidney disease, or unspecified chronic kidney disease: Secondary | ICD-10-CM | POA: Diagnosis not present

## 2023-07-04 DIAGNOSIS — C9002 Multiple myeloma in relapse: Secondary | ICD-10-CM

## 2023-07-04 DIAGNOSIS — M25511 Pain in right shoulder: Secondary | ICD-10-CM | POA: Diagnosis not present

## 2023-07-04 DIAGNOSIS — G4700 Insomnia, unspecified: Secondary | ICD-10-CM | POA: Diagnosis not present

## 2023-07-04 DIAGNOSIS — E871 Hypo-osmolality and hyponatremia: Secondary | ICD-10-CM | POA: Diagnosis not present

## 2023-07-04 DIAGNOSIS — Z85528 Personal history of other malignant neoplasm of kidney: Secondary | ICD-10-CM | POA: Diagnosis not present

## 2023-07-04 DIAGNOSIS — Z7961 Long term (current) use of immunomodulator: Secondary | ICD-10-CM | POA: Diagnosis not present

## 2023-07-04 DIAGNOSIS — R682 Dry mouth, unspecified: Secondary | ICD-10-CM | POA: Diagnosis not present

## 2023-07-04 DIAGNOSIS — Z7189 Other specified counseling: Secondary | ICD-10-CM

## 2023-07-04 DIAGNOSIS — M7989 Other specified soft tissue disorders: Secondary | ICD-10-CM | POA: Diagnosis not present

## 2023-07-04 DIAGNOSIS — Z7952 Long term (current) use of systemic steroids: Secondary | ICD-10-CM | POA: Diagnosis not present

## 2023-07-04 DIAGNOSIS — C9000 Multiple myeloma not having achieved remission: Secondary | ICD-10-CM | POA: Diagnosis not present

## 2023-07-04 DIAGNOSIS — Z791 Long term (current) use of non-steroidal anti-inflammatories (NSAID): Secondary | ICD-10-CM | POA: Diagnosis not present

## 2023-07-04 DIAGNOSIS — Z5112 Encounter for antineoplastic immunotherapy: Secondary | ICD-10-CM | POA: Diagnosis not present

## 2023-07-04 DIAGNOSIS — I700 Atherosclerosis of aorta: Secondary | ICD-10-CM | POA: Diagnosis not present

## 2023-07-04 DIAGNOSIS — Z79899 Other long term (current) drug therapy: Secondary | ICD-10-CM | POA: Diagnosis not present

## 2023-07-04 DIAGNOSIS — N183 Chronic kidney disease, stage 3 unspecified: Secondary | ICD-10-CM | POA: Diagnosis not present

## 2023-07-04 DIAGNOSIS — M129 Arthropathy, unspecified: Secondary | ICD-10-CM | POA: Diagnosis not present

## 2023-07-04 DIAGNOSIS — J320 Chronic maxillary sinusitis: Secondary | ICD-10-CM | POA: Diagnosis not present

## 2023-07-04 LAB — CBC WITH DIFFERENTIAL (CANCER CENTER ONLY)
Abs Immature Granulocytes: 0.03 10*3/uL (ref 0.00–0.07)
Basophils Absolute: 0 10*3/uL (ref 0.0–0.1)
Basophils Relative: 1 %
Eosinophils Absolute: 0 10*3/uL (ref 0.0–0.5)
Eosinophils Relative: 0 %
HCT: 26.6 % — ABNORMAL LOW (ref 39.0–52.0)
Hemoglobin: 8.6 g/dL — ABNORMAL LOW (ref 13.0–17.0)
Immature Granulocytes: 1 %
Lymphocytes Relative: 13 %
Lymphs Abs: 0.5 10*3/uL — ABNORMAL LOW (ref 0.7–4.0)
MCH: 31.7 pg (ref 26.0–34.0)
MCHC: 32.3 g/dL (ref 30.0–36.0)
MCV: 98.2 fL (ref 80.0–100.0)
Monocytes Absolute: 0.2 10*3/uL (ref 0.1–1.0)
Monocytes Relative: 6 %
Neutro Abs: 3.1 10*3/uL (ref 1.7–7.7)
Neutrophils Relative %: 79 %
Platelet Count: 126 10*3/uL — ABNORMAL LOW (ref 150–400)
RBC: 2.71 MIL/uL — ABNORMAL LOW (ref 4.22–5.81)
RDW: 18.6 % — ABNORMAL HIGH (ref 11.5–15.5)
WBC Count: 3.9 10*3/uL — ABNORMAL LOW (ref 4.0–10.5)
nRBC: 0 % (ref 0.0–0.2)

## 2023-07-04 LAB — COMPREHENSIVE METABOLIC PANEL
ALT: 16 U/L (ref 0–44)
AST: 14 U/L — ABNORMAL LOW (ref 15–41)
Albumin: 2.8 g/dL — ABNORMAL LOW (ref 3.5–5.0)
Alkaline Phosphatase: 43 U/L (ref 38–126)
Anion gap: 6 (ref 5–15)
BUN: 27 mg/dL — ABNORMAL HIGH (ref 8–23)
CO2: 24 mmol/L (ref 22–32)
Calcium: 7.7 mg/dL — ABNORMAL LOW (ref 8.9–10.3)
Chloride: 106 mmol/L (ref 98–111)
Creatinine, Ser: 2.07 mg/dL — ABNORMAL HIGH (ref 0.61–1.24)
GFR, Estimated: 33 mL/min — ABNORMAL LOW (ref >60)
Glucose, Bld: 103 mg/dL — ABNORMAL HIGH (ref 70–99)
Potassium: 4.4 mmol/L (ref 3.5–5.1)
Sodium: 136 mmol/L (ref 135–145)
Total Bilirubin: 0.3 mg/dL (ref 0.3–1.2)
Total Protein: 5.1 g/dL — ABNORMAL LOW (ref 6.5–8.1)

## 2023-07-04 MED ORDER — ACETAMINOPHEN 325 MG PO TABS
650.0000 mg | ORAL_TABLET | Freq: Once | ORAL | Status: AC
  Administered 2023-07-04: 650 mg via ORAL
  Filled 2023-07-04: qty 2

## 2023-07-04 MED ORDER — DARATUMUMAB-HYALURONIDASE-FIHJ 1800-30000 MG-UT/15ML ~~LOC~~ SOLN
1800.0000 mg | Freq: Once | SUBCUTANEOUS | Status: AC
  Administered 2023-07-04: 1800 mg via SUBCUTANEOUS
  Filled 2023-07-04: qty 15

## 2023-07-04 MED ORDER — DEXAMETHASONE 4 MG PO TABS
12.0000 mg | ORAL_TABLET | Freq: Once | ORAL | Status: AC
  Administered 2023-07-04: 12 mg via ORAL
  Filled 2023-07-04: qty 3

## 2023-07-04 MED ORDER — DIPHENHYDRAMINE HCL 25 MG PO CAPS
50.0000 mg | ORAL_CAPSULE | Freq: Once | ORAL | Status: AC
  Administered 2023-07-04: 50 mg via ORAL
  Filled 2023-07-04: qty 2

## 2023-07-04 MED ORDER — FAMOTIDINE 20 MG PO TABS
20.0000 mg | ORAL_TABLET | Freq: Once | ORAL | Status: AC
  Administered 2023-07-04: 20 mg via ORAL
  Filled 2023-07-04: qty 1

## 2023-07-04 NOTE — Patient Instructions (Signed)
[place] [facility]  Discharge Instructions: Thank you for choosing [facility] to provide your oncology and hematology care.   If you have a lab appointment with the Cancer Center, please go directly to the Cancer Center and check in at the registration area.   Wear comfortable clothing and clothing appropriate for easy access to any Portacath or PICC line.   We strive to give you quality time with your provider. You may need to reschedule your appointment if you arrive late (15 or more minutes).  Arriving late affects you and other patients whose appointments are after yours.  Also, if you miss three or more appointments without notifying the office, you may be dismissed from the clinic at the provider's discretion.      For prescription refill requests, have your pharmacy contact our office and allow 72 hours for refills to be completed.    Today you received the following chemotherapy and/or immunotherapy agent: Dara Faspro.      To help prevent nausea and vomiting after your treatment, we encourage you to take your nausea medication as directed.  BELOW ARE SYMPTOMS THAT SHOULD BE REPORTED IMMEDIATELY: *FEVER GREATER THAN 100.4 F (38 C) OR HIGHER *CHILLS OR SWEATING *NAUSEA AND VOMITING THAT IS NOT CONTROLLED WITH YOUR NAUSEA MEDICATION *UNUSUAL SHORTNESS OF BREATH *UNUSUAL BRUISING OR BLEEDING *URINARY PROBLEMS (pain or burning when urinating, or frequent urination) *BOWEL PROBLEMS (unusual diarrhea, constipation, pain near the anus) TENDERNESS IN MOUTH AND THROAT WITH OR WITHOUT PRESENCE OF ULCERS (sore throat, sores in mouth, or a toothache) UNUSUAL RASH, SWELLING OR PAIN  UNUSUAL VAGINAL DISCHARGE OR ITCHING   Items with * indicate a potential emergency and should be followed up as soon as possible or go to the Emergency Department if any problems should occur.  Please show the CHEMOTHERAPY ALERT CARD or IMMUNOTHERAPY ALERT CARD at  check-in to the Emergency Department and triage nurse.  Should you have questions after your visit or need to cancel or reschedule your appointment, please contact [place] [facility]  Dept: [phone]  and follow the prompts.  Office hours are 8:00 a.m. to 4:30 p.m. Monday - Friday. Please note that voicemails left after 4:00 p.m. may not be returned until the following business day.  We are closed weekends and major holidays. You have access to a nurse at all times for urgent questions. Please call the main number to the clinic Dept: [phone] and follow the prompts.   For any non-urgent questions, you may also contact your provider using MyChart. We now offer e-Visits for anyone 14 and older to request care online for non-urgent symptoms. For details visit [email or web address].   Also download the MyChart app! Go to the app store, search "MyChart", open the app, select [place], and log in with your MyChart username and password.

## 2023-07-06 ENCOUNTER — Other Ambulatory Visit: Payer: Self-pay

## 2023-07-11 ENCOUNTER — Inpatient Hospital Stay: Payer: Medicare HMO

## 2023-07-11 VITALS — BP 105/65 | HR 83 | Temp 98.1°F | Resp 16

## 2023-07-11 DIAGNOSIS — N183 Chronic kidney disease, stage 3 unspecified: Secondary | ICD-10-CM | POA: Diagnosis not present

## 2023-07-11 DIAGNOSIS — I129 Hypertensive chronic kidney disease with stage 1 through stage 4 chronic kidney disease, or unspecified chronic kidney disease: Secondary | ICD-10-CM | POA: Diagnosis not present

## 2023-07-11 DIAGNOSIS — C9002 Multiple myeloma in relapse: Secondary | ICD-10-CM

## 2023-07-11 DIAGNOSIS — R682 Dry mouth, unspecified: Secondary | ICD-10-CM | POA: Diagnosis not present

## 2023-07-11 DIAGNOSIS — Z7961 Long term (current) use of immunomodulator: Secondary | ICD-10-CM | POA: Diagnosis not present

## 2023-07-11 DIAGNOSIS — Z791 Long term (current) use of non-steroidal anti-inflammatories (NSAID): Secondary | ICD-10-CM | POA: Diagnosis not present

## 2023-07-11 DIAGNOSIS — J320 Chronic maxillary sinusitis: Secondary | ICD-10-CM | POA: Diagnosis not present

## 2023-07-11 DIAGNOSIS — Z5112 Encounter for antineoplastic immunotherapy: Secondary | ICD-10-CM | POA: Diagnosis not present

## 2023-07-11 DIAGNOSIS — Z7189 Other specified counseling: Secondary | ICD-10-CM

## 2023-07-11 DIAGNOSIS — Z7982 Long term (current) use of aspirin: Secondary | ICD-10-CM | POA: Diagnosis not present

## 2023-07-11 DIAGNOSIS — G4700 Insomnia, unspecified: Secondary | ICD-10-CM | POA: Diagnosis not present

## 2023-07-11 DIAGNOSIS — Z7952 Long term (current) use of systemic steroids: Secondary | ICD-10-CM | POA: Diagnosis not present

## 2023-07-11 DIAGNOSIS — C9000 Multiple myeloma not having achieved remission: Secondary | ICD-10-CM | POA: Diagnosis not present

## 2023-07-11 DIAGNOSIS — Z79624 Long term (current) use of inhibitors of nucleotide synthesis: Secondary | ICD-10-CM | POA: Diagnosis not present

## 2023-07-11 DIAGNOSIS — M129 Arthropathy, unspecified: Secondary | ICD-10-CM | POA: Diagnosis not present

## 2023-07-11 DIAGNOSIS — Z85528 Personal history of other malignant neoplasm of kidney: Secondary | ICD-10-CM | POA: Diagnosis not present

## 2023-07-11 DIAGNOSIS — E871 Hypo-osmolality and hyponatremia: Secondary | ICD-10-CM | POA: Diagnosis not present

## 2023-07-11 DIAGNOSIS — M25511 Pain in right shoulder: Secondary | ICD-10-CM | POA: Diagnosis not present

## 2023-07-11 DIAGNOSIS — Z79899 Other long term (current) drug therapy: Secondary | ICD-10-CM | POA: Diagnosis not present

## 2023-07-11 DIAGNOSIS — M7989 Other specified soft tissue disorders: Secondary | ICD-10-CM | POA: Diagnosis not present

## 2023-07-11 DIAGNOSIS — I700 Atherosclerosis of aorta: Secondary | ICD-10-CM | POA: Diagnosis not present

## 2023-07-11 LAB — COMPREHENSIVE METABOLIC PANEL
ALT: 15 U/L (ref 0–44)
AST: 14 U/L — ABNORMAL LOW (ref 15–41)
Albumin: 2.9 g/dL — ABNORMAL LOW (ref 3.5–5.0)
Alkaline Phosphatase: 50 U/L (ref 38–126)
Anion gap: 7 (ref 5–15)
BUN: 17 mg/dL (ref 8–23)
CO2: 23 mmol/L (ref 22–32)
Calcium: 7.9 mg/dL — ABNORMAL LOW (ref 8.9–10.3)
Chloride: 112 mmol/L — ABNORMAL HIGH (ref 98–111)
Creatinine, Ser: 1.79 mg/dL — ABNORMAL HIGH (ref 0.61–1.24)
GFR, Estimated: 39 mL/min — ABNORMAL LOW (ref >60)
Glucose, Bld: 125 mg/dL — ABNORMAL HIGH (ref 70–99)
Potassium: 3.6 mmol/L (ref 3.5–5.1)
Sodium: 142 mmol/L (ref 135–145)
Total Bilirubin: 0.3 mg/dL (ref 0.3–1.2)
Total Protein: 5.2 g/dL — ABNORMAL LOW (ref 6.5–8.1)

## 2023-07-11 LAB — CBC WITH DIFFERENTIAL (CANCER CENTER ONLY)
Abs Immature Granulocytes: 0.02 10*3/uL (ref 0.00–0.07)
Basophils Absolute: 0 10*3/uL (ref 0.0–0.1)
Basophils Relative: 1 %
Eosinophils Absolute: 0.1 10*3/uL (ref 0.0–0.5)
Eosinophils Relative: 2 %
HCT: 25.3 % — ABNORMAL LOW (ref 39.0–52.0)
Hemoglobin: 8.2 g/dL — ABNORMAL LOW (ref 13.0–17.0)
Immature Granulocytes: 1 %
Lymphocytes Relative: 15 %
Lymphs Abs: 0.6 10*3/uL — ABNORMAL LOW (ref 0.7–4.0)
MCH: 31.5 pg (ref 26.0–34.0)
MCHC: 32.4 g/dL (ref 30.0–36.0)
MCV: 97.3 fL (ref 80.0–100.0)
Monocytes Absolute: 0.2 10*3/uL (ref 0.1–1.0)
Monocytes Relative: 7 %
Neutro Abs: 2.8 10*3/uL (ref 1.7–7.7)
Neutrophils Relative %: 74 %
Platelet Count: 151 10*3/uL (ref 150–400)
RBC: 2.6 MIL/uL — ABNORMAL LOW (ref 4.22–5.81)
RDW: 18.2 % — ABNORMAL HIGH (ref 11.5–15.5)
WBC Count: 3.7 10*3/uL — ABNORMAL LOW (ref 4.0–10.5)
nRBC: 0 % (ref 0.0–0.2)

## 2023-07-11 MED ORDER — FAMOTIDINE 20 MG PO TABS
20.0000 mg | ORAL_TABLET | Freq: Once | ORAL | Status: AC
  Administered 2023-07-11: 20 mg via ORAL
  Filled 2023-07-11: qty 1

## 2023-07-11 MED ORDER — DIPHENHYDRAMINE HCL 25 MG PO CAPS
50.0000 mg | ORAL_CAPSULE | Freq: Once | ORAL | Status: AC
  Administered 2023-07-11: 50 mg via ORAL
  Filled 2023-07-11: qty 2

## 2023-07-11 MED ORDER — DARATUMUMAB-HYALURONIDASE-FIHJ 1800-30000 MG-UT/15ML ~~LOC~~ SOLN
1800.0000 mg | Freq: Once | SUBCUTANEOUS | Status: AC
  Administered 2023-07-11: 1800 mg via SUBCUTANEOUS
  Filled 2023-07-11: qty 15

## 2023-07-11 MED ORDER — DEXAMETHASONE 4 MG PO TABS
12.0000 mg | ORAL_TABLET | Freq: Once | ORAL | Status: AC
  Administered 2023-07-11: 12 mg via ORAL
  Filled 2023-07-11: qty 3

## 2023-07-11 MED ORDER — ACETAMINOPHEN 325 MG PO TABS
650.0000 mg | ORAL_TABLET | Freq: Once | ORAL | Status: AC
  Administered 2023-07-11: 650 mg via ORAL
  Filled 2023-07-11: qty 2

## 2023-07-11 NOTE — Patient Instructions (Signed)
[place] [facility]  Discharge Instructions: Thank you for choosing [facility] to provide your oncology and hematology care.   If you have a lab appointment with the Cancer Center, please go directly to the Cancer Center and check in at the registration area.   Wear comfortable clothing and clothing appropriate for easy access to any Portacath or PICC line.   We strive to give you quality time with your provider. You may need to reschedule your appointment if you arrive late (15 or more minutes).  Arriving late affects you and other patients whose appointments are after yours.  Also, if you miss three or more appointments without notifying the office, you may be dismissed from the clinic at the provider's discretion.      For prescription refill requests, have your pharmacy contact our office and allow 72 hours for refills to be completed.    Today you received the following chemotherapy and/or immunotherapy agents :  Darzelex Fastpro       To help prevent nausea and vomiting after your treatment, we encourage you to take your nausea medication as directed.  BELOW ARE SYMPTOMS THAT SHOULD BE REPORTED IMMEDIATELY: *FEVER GREATER THAN 100.4 F (38 C) OR HIGHER *CHILLS OR SWEATING *NAUSEA AND VOMITING THAT IS NOT CONTROLLED WITH YOUR NAUSEA MEDICATION *UNUSUAL SHORTNESS OF BREATH *UNUSUAL BRUISING OR BLEEDING *URINARY PROBLEMS (pain or burning when urinating, or frequent urination) *BOWEL PROBLEMS (unusual diarrhea, constipation, pain near the anus) TENDERNESS IN MOUTH AND THROAT WITH OR WITHOUT PRESENCE OF ULCERS (sore throat, sores in mouth, or a toothache) UNUSUAL RASH, SWELLING OR PAIN  UNUSUAL VAGINAL DISCHARGE OR ITCHING   Items with * indicate a potential emergency and should be followed up as soon as possible or go to the Emergency Department if any problems should occur.  Please show the CHEMOTHERAPY ALERT CARD or IMMUNOTHERAPY ALERT  CARD at check-in to the Emergency Department and triage nurse.  Should you have questions after your visit or need to cancel or reschedule your appointment, please contact [place] [facility]  Dept: [phone]  and follow the prompts.  Office hours are 8:00 a.m. to 4:30 p.m. Monday - Friday. Please note that voicemails left after 4:00 p.m. may not be returned until the following business day.  We are closed weekends and major holidays. You have access to a nurse at all times for urgent questions. Please call the main number to the clinic Dept: [phone] and follow the prompts.   For any non-urgent questions, you may also contact your provider using MyChart. We now offer e-Visits for anyone 24 and older to request care online for non-urgent symptoms. For details visit [email or web address].   Also download the MyChart app! Go to the app store, search "MyChart", open the app, select [place], and log in with your MyChart username and password.

## 2023-07-12 LAB — KAPPA/LAMBDA LIGHT CHAINS
Kappa free light chain: 13.1 mg/L (ref 3.3–19.4)
Kappa, lambda light chain ratio: 0.79 (ref 0.26–1.65)
Lambda free light chains: 16.6 mg/L (ref 5.7–26.3)

## 2023-07-13 LAB — MULTIPLE MYELOMA PANEL, SERUM
Albumin SerPl Elph-Mcnc: 2.5 g/dL — ABNORMAL LOW (ref 2.9–4.4)
Albumin/Glob SerPl: 1 (ref 0.7–1.7)
Alpha 1: 0.3 g/dL (ref 0.0–0.4)
B-Globulin SerPl Elph-Mcnc: 0.8 g/dL (ref 0.7–1.3)
Gamma Glob SerPl Elph-Mcnc: 0.5 g/dL (ref 0.4–1.8)
Globulin, Total: 2.6 g/dL (ref 2.2–3.9)
IgA: 37 mg/dL — ABNORMAL LOW (ref 61–437)
IgG (Immunoglobin G), Serum: 576 mg/dL — ABNORMAL LOW (ref 603–1613)
IgM (Immunoglobulin M), Srm: 15 mg/dL (ref 15–143)
M Protein SerPl Elph-Mcnc: 0.3 g/dL — ABNORMAL HIGH
Total Protein ELP: 5.1 g/dL — ABNORMAL LOW (ref 6.0–8.5)

## 2023-07-18 ENCOUNTER — Inpatient Hospital Stay: Payer: Medicare HMO

## 2023-07-18 ENCOUNTER — Inpatient Hospital Stay: Payer: Medicare HMO | Admitting: Physician Assistant

## 2023-07-18 VITALS — Temp 99.1°F

## 2023-07-18 DIAGNOSIS — Z7189 Other specified counseling: Secondary | ICD-10-CM

## 2023-07-18 DIAGNOSIS — C9002 Multiple myeloma in relapse: Secondary | ICD-10-CM | POA: Diagnosis not present

## 2023-07-18 DIAGNOSIS — I700 Atherosclerosis of aorta: Secondary | ICD-10-CM | POA: Diagnosis not present

## 2023-07-18 DIAGNOSIS — N183 Chronic kidney disease, stage 3 unspecified: Secondary | ICD-10-CM | POA: Diagnosis not present

## 2023-07-18 DIAGNOSIS — Z7982 Long term (current) use of aspirin: Secondary | ICD-10-CM | POA: Diagnosis not present

## 2023-07-18 DIAGNOSIS — Z79624 Long term (current) use of inhibitors of nucleotide synthesis: Secondary | ICD-10-CM | POA: Diagnosis not present

## 2023-07-18 DIAGNOSIS — M7989 Other specified soft tissue disorders: Secondary | ICD-10-CM | POA: Diagnosis not present

## 2023-07-18 DIAGNOSIS — C9000 Multiple myeloma not having achieved remission: Secondary | ICD-10-CM | POA: Diagnosis not present

## 2023-07-18 DIAGNOSIS — Z7952 Long term (current) use of systemic steroids: Secondary | ICD-10-CM | POA: Diagnosis not present

## 2023-07-18 DIAGNOSIS — R682 Dry mouth, unspecified: Secondary | ICD-10-CM | POA: Diagnosis not present

## 2023-07-18 DIAGNOSIS — M25511 Pain in right shoulder: Secondary | ICD-10-CM | POA: Diagnosis not present

## 2023-07-18 DIAGNOSIS — M129 Arthropathy, unspecified: Secondary | ICD-10-CM | POA: Diagnosis not present

## 2023-07-18 DIAGNOSIS — Z79899 Other long term (current) drug therapy: Secondary | ICD-10-CM | POA: Diagnosis not present

## 2023-07-18 DIAGNOSIS — J320 Chronic maxillary sinusitis: Secondary | ICD-10-CM | POA: Diagnosis not present

## 2023-07-18 DIAGNOSIS — G4700 Insomnia, unspecified: Secondary | ICD-10-CM | POA: Diagnosis not present

## 2023-07-18 DIAGNOSIS — I129 Hypertensive chronic kidney disease with stage 1 through stage 4 chronic kidney disease, or unspecified chronic kidney disease: Secondary | ICD-10-CM | POA: Diagnosis not present

## 2023-07-18 DIAGNOSIS — Z791 Long term (current) use of non-steroidal anti-inflammatories (NSAID): Secondary | ICD-10-CM | POA: Diagnosis not present

## 2023-07-18 DIAGNOSIS — Z7961 Long term (current) use of immunomodulator: Secondary | ICD-10-CM | POA: Diagnosis not present

## 2023-07-18 DIAGNOSIS — Z5112 Encounter for antineoplastic immunotherapy: Secondary | ICD-10-CM | POA: Diagnosis not present

## 2023-07-18 DIAGNOSIS — Z85528 Personal history of other malignant neoplasm of kidney: Secondary | ICD-10-CM | POA: Diagnosis not present

## 2023-07-18 DIAGNOSIS — E871 Hypo-osmolality and hyponatremia: Secondary | ICD-10-CM | POA: Diagnosis not present

## 2023-07-18 LAB — COMPREHENSIVE METABOLIC PANEL
ALT: 10 U/L (ref 0–44)
AST: 12 U/L — ABNORMAL LOW (ref 15–41)
Albumin: 2.9 g/dL — ABNORMAL LOW (ref 3.5–5.0)
Alkaline Phosphatase: 46 U/L (ref 38–126)
Anion gap: 6 (ref 5–15)
BUN: 17 mg/dL (ref 8–23)
CO2: 25 mmol/L (ref 22–32)
Calcium: 7.7 mg/dL — ABNORMAL LOW (ref 8.9–10.3)
Chloride: 110 mmol/L (ref 98–111)
Creatinine, Ser: 1.68 mg/dL — ABNORMAL HIGH (ref 0.61–1.24)
GFR, Estimated: 42 mL/min — ABNORMAL LOW (ref >60)
Glucose, Bld: 115 mg/dL — ABNORMAL HIGH (ref 70–99)
Potassium: 3.5 mmol/L (ref 3.5–5.1)
Sodium: 141 mmol/L (ref 135–145)
Total Bilirubin: 0.3 mg/dL (ref 0.3–1.2)
Total Protein: 5.2 g/dL — ABNORMAL LOW (ref 6.5–8.1)

## 2023-07-18 LAB — CBC WITH DIFFERENTIAL (CANCER CENTER ONLY)
Abs Immature Granulocytes: 0.05 10*3/uL (ref 0.00–0.07)
Basophils Absolute: 0 10*3/uL (ref 0.0–0.1)
Basophils Relative: 1 %
Eosinophils Absolute: 0.1 10*3/uL (ref 0.0–0.5)
Eosinophils Relative: 2 %
HCT: 26.2 % — ABNORMAL LOW (ref 39.0–52.0)
Hemoglobin: 8.6 g/dL — ABNORMAL LOW (ref 13.0–17.0)
Immature Granulocytes: 1 %
Lymphocytes Relative: 33 %
Lymphs Abs: 1.3 10*3/uL (ref 0.7–4.0)
MCH: 31.9 pg (ref 26.0–34.0)
MCHC: 32.8 g/dL (ref 30.0–36.0)
MCV: 97 fL (ref 80.0–100.0)
Monocytes Absolute: 0.5 10*3/uL (ref 0.1–1.0)
Monocytes Relative: 13 %
Neutro Abs: 2 10*3/uL (ref 1.7–7.7)
Neutrophils Relative %: 50 %
Platelet Count: 203 10*3/uL (ref 150–400)
RBC: 2.7 MIL/uL — ABNORMAL LOW (ref 4.22–5.81)
RDW: 17.8 % — ABNORMAL HIGH (ref 11.5–15.5)
WBC Count: 4.1 10*3/uL (ref 4.0–10.5)
nRBC: 0 % (ref 0.0–0.2)

## 2023-07-18 MED ORDER — ACETAMINOPHEN 325 MG PO TABS
650.0000 mg | ORAL_TABLET | Freq: Once | ORAL | Status: AC
  Administered 2023-07-18: 650 mg via ORAL
  Filled 2023-07-18: qty 2

## 2023-07-18 MED ORDER — DEXAMETHASONE 4 MG PO TABS
12.0000 mg | ORAL_TABLET | Freq: Once | ORAL | Status: AC
  Administered 2023-07-18: 12 mg via ORAL
  Filled 2023-07-18: qty 3

## 2023-07-18 MED ORDER — DARATUMUMAB-HYALURONIDASE-FIHJ 1800-30000 MG-UT/15ML ~~LOC~~ SOLN
1800.0000 mg | Freq: Once | SUBCUTANEOUS | Status: AC
  Administered 2023-07-18: 1800 mg via SUBCUTANEOUS
  Filled 2023-07-18: qty 15

## 2023-07-18 MED ORDER — DIPHENHYDRAMINE HCL 25 MG PO CAPS
50.0000 mg | ORAL_CAPSULE | Freq: Once | ORAL | Status: AC
  Administered 2023-07-18: 50 mg via ORAL
  Filled 2023-07-18: qty 2

## 2023-07-18 MED ORDER — FAMOTIDINE 20 MG PO TABS
20.0000 mg | ORAL_TABLET | Freq: Once | ORAL | Status: AC
  Administered 2023-07-18: 20 mg via ORAL
  Filled 2023-07-18: qty 1

## 2023-07-18 NOTE — Patient Instructions (Signed)
[place] [facility]  Discharge Instructions: Thank you for choosing [facility] to provide your oncology and hematology care.   If you have a lab appointment with the Cancer Center, please go directly to the Cancer Center and check in at the registration area.   Wear comfortable clothing and clothing appropriate for easy access to any Portacath or PICC line.   We strive to give you quality time with your provider. You may need to reschedule your appointment if you arrive late (15 or more minutes).  Arriving late affects you and other patients whose appointments are after yours.  Also, if you miss three or more appointments without notifying the office, you may be dismissed from the clinic at the provider's discretion.      For prescription refill requests, have your pharmacy contact our office and allow 72 hours for refills to be completed.    Today you received the following chemotherapy and/or immunotherapy agents :  Darzelex Fastpro       To help prevent nausea and vomiting after your treatment, we encourage you to take your nausea medication as directed.  BELOW ARE SYMPTOMS THAT SHOULD BE REPORTED IMMEDIATELY: *FEVER GREATER THAN 100.4 F (38 C) OR HIGHER *CHILLS OR SWEATING *NAUSEA AND VOMITING THAT IS NOT CONTROLLED WITH YOUR NAUSEA MEDICATION *UNUSUAL SHORTNESS OF BREATH *UNUSUAL BRUISING OR BLEEDING *URINARY PROBLEMS (pain or burning when urinating, or frequent urination) *BOWEL PROBLEMS (unusual diarrhea, constipation, pain near the anus) TENDERNESS IN MOUTH AND THROAT WITH OR WITHOUT PRESENCE OF ULCERS (sore throat, sores in mouth, or a toothache) UNUSUAL RASH, SWELLING OR PAIN  UNUSUAL VAGINAL DISCHARGE OR ITCHING   Items with * indicate a potential emergency and should be followed up as soon as possible or go to the Emergency Department if any problems should occur.  Please show the CHEMOTHERAPY ALERT CARD or IMMUNOTHERAPY ALERT  CARD at check-in to the Emergency Department and triage nurse.  Should you have questions after your visit or need to cancel or reschedule your appointment, please contact [place] [facility]  Dept: [phone]  and follow the prompts.  Office hours are 8:00 a.m. to 4:30 p.m. Monday - Friday. Please note that voicemails left after 4:00 p.m. may not be returned until the following business day.  We are closed weekends and major holidays. You have access to a nurse at all times for urgent questions. Please call the main number to the clinic Dept: [phone] and follow the prompts.   For any non-urgent questions, you may also contact your provider using MyChart. We now offer e-Visits for anyone 53 and older to request care online for non-urgent symptoms. For details visit [email or web address].   Also download the MyChart app! Go to the app store, search "MyChart", open the app, select [facility], and log in with your MyChart username and password.

## 2023-07-18 NOTE — Progress Notes (Signed)
HEMATOLOGY/ONCOLOGY CLINIC NOTE  Date of Service: 07/18/23   Patient Care Team: Chilton Greathouse, MD as PCP - General (Internal Medicine)  CHIEF COMPLAINTS/PURPOSE OF CONSULTATION:  IgG kappa mutliple myeloma with base of skull plasmacytoma  TREATMENT HISTORY 05/23/2016: Induction chemotherapy with bortezomib 1.3 mg/m2 weekly and dexamethasone 40 mg weekly using 28-day cycles. Plans to add lenalidomide and Zometa with cycle 2 (06/20/2016).  07/18/2016: Added lenalidomide 10 mg days 1-21 out of 28 days, delayed starting due to insurance. Started lenalidomide on 07/18/2016 with cycle 3. Achieved a PR after 4 cycles of treatment (2 cycles containing lenalidomide). 11/14/2016: Treatment changed to carfilzomib (D1/8/15) and dexamethasone to achieve a deeper response after having plateaued on prior regimen.  01/24/2017: Bone marrow biopsy showed no increased plasma cells but was positive for Congo red stain. Diagnosis of amyloid was made (AL pending confirmation with mass spect). 02/01/2017: Cyclophosphamide added to existing regimen on D1/8/15 to deepen response and due to amyloid deposits found on bone marrow biopsy. 06/19/2017: Transitioned to D1/15 dosing in an attempt to transition to maintenance. His M-spike was 0.2 g/dL, consistent with a PR but close to VGPR. 10/02/2017: Discontinued dexamethasone but continued on cyclophosphamide and carfilzomib after showing persistent stable disease with M-spike 0.29 g/dL. 10/2017: Marcus Hatfield held all therapy starting in 10/2017 since he believed it was not needed. A long discussion was had with him during visit in 04/2018 regarding concern for chemical relapse but he refused to get labs drawn and did not show interest in resuming some type of therapy.  07/16/2018 - 06/11/2019: He achieved an unconfirmed remission (VGPR) when re-assessed and maintained no detectable disease by serology since while on surveillance. 10/07/2019: Routine serology revealed  a new M-spike of 0.56 g/dL with IFIX IgG kappa. Patient maintained on surveillance with repeat labs planned for 11/06/2019. 11/06/2019: Confirmed chemical relapse with 2 consecutive positive m-spikes (0.56 and 0.47). A repeat serology from 12/16/19 showed an M-spike of 0.86 and restaging was was performed with bone marrow biopsy and PETCT scan in 12/2019, which showed 3% kappa restricted plasma cells and no evidence of new/active lesions. Patient remained on active surveillance per his request.  05/20/2023: Underwent CT head due to vision disturbance to the left eye x 3 weeks. Findings included large mass at the skull base, eroding and expanding the clivus,also involving the sphenoid bone and the petrous portions of both temporal bones. The mass broadly abuts both optic nerves between the chiasm and orbital apices. Mass also broadly involves both cavernous sinuses. 05/20/2023: MRI brain: 7.8 x 5.6 x 3.3 cm enhancing mass lesion at the skull base extending along the petrous ridge bilaterally, greater on the right. 05/21/2023: CT CAP: Numerous lytic osseous lesions, consistent with patient's history of multiple myeloma. A large lucent lesion of the T6 vertebral body involves the posterior cortex, extension into the spinal canal can not be excluded 05/22/2023-06/02/2023: Received palliative radiation to base of skull and medial right clavicle. 06/13/2023: Started Cycle 1, Day 1 of Daratumumab/Revlimid/Dex.  06/19/2023: Recommend to hold Revlimid as patient cannot take aspirin therapy due to recent GI bleed.   INTERVAL HISTORY: Marcus Hatfield is a 77 y.o. male who is here for continued evaluation and management of multiple myeloma with base of skull plasmacytoma. He was last seen by Dr. Candise Che on 06/19/2023.  Marcus Hatfield reports he is tolerating his treatment without any issues. He reports having fatigue but he is able to complete his ADLs on his own. He has a good appetite and denies  any weight changes. He continues to  have dry mouth but is aware to  use biotene mouth rinses. He denies nausea, vomiting or bowel habit changes. He denies any bruising or bleeding episodes. He denies any new or worsening bone pain. He denies fevers, chills, sweats, shortness of breath, chest pain or cough. He has no other complaints.   MEDICAL HISTORY:  Past Medical History:  Diagnosis Date   Arthritis    CKD (chronic kidney disease) stage 3, GFR 30-59 ml/min (HCC) 20015   Creat 1.9   Hypertension    Multiple myeloma (HCC) 2016   [facility] heme onc   Prostate disorder 02/2017    SURGICAL HISTORY: Past Surgical History:  Procedure Laterality Date   BIOPSY  06/06/2023   Procedure: BIOPSY;  Surgeon: [name], MD;  Location: [facility] ENDOSCOPY;  Service: Gastroenterology;;   ESOPHAGOGASTRODUODENOSCOPY (EGD) WITH PROPOFOL N/A 06/06/2023   Procedure: ESOPHAGOGASTRODUODENOSCOPY (EGD) WITH PROPOFOL;  Surgeon: [name], MD;  Location: [facility] ENDOSCOPY;  Service: Gastroenterology;  Laterality: N/A;   EYE SURGERY Bilateral 01/30/2018   KNEE SURGERY     over ten years ago   NEPHRECTOMY Right 03/2015   ROBOTIC ASSITED PARTIAL NEPHRECTOMY Left 07/07/2021   Procedure: XI ROBOTIC ASSITED PARTIAL NEPHRECTOMY;  Surgeon: [name], MD;  Location: [facility] ORS;  Service: Urology;  Laterality: Left;  5 HRS   XI ROBOTIC ASSISTED SIMPLE PROSTATECTOMY N/A 07/07/2021   Procedure: XI ROBOTIC ASSISTED SIMPLE PROSTATECTOMY;  Surgeon: [name], MD;  Location: [facility] ORS;  Service: Urology;  Laterality: N/A;    SOCIAL HISTORY: Social History   Socioeconomic History   Marital status: Married    Spouse name: Museum/gallery curator   Number of children: 2   Years of education: Not on file   Highest education level: Not on file  Occupational History   Occupation: Retired  Tobacco Use   Smoking status: Never   Smokeless tobacco: Never  Vaping Use   Vaping status: Never Used  Substance and Sexual Activity   Alcohol use: No   Drug use: No   Sexual  activity: Not on file  Other Topics Concern   Not on file  Social History Narrative   Not on file   Social Determinants of Health   Financial Resource Strain: Not on file  Food Insecurity: Patient Declined (06/05/2023)   Hunger Vital Sign    Worried About Running Out of Food in the Last Year: Patient declined    Ran Out of Food in the Last Year: Patient declined  Transportation Needs: Patient Declined (06/05/2023)   PRAPARE - Administrator, Civil Service (Medical): Patient declined    Lack of Transportation (Non-Medical): Patient declined  Physical Activity: Not on file  Stress: Not on file  Social Connections: Not on file  Intimate Partner Violence: Patient Declined (06/05/2023)   Humiliation, Afraid, Rape, and Kick questionnaire    Fear of Current or Ex-Partner: Patient declined    Emotionally Abused: Patient declined    Physically Abused: Patient declined    Sexually Abused: Patient declined    FAMILY HISTORY: Family History  Problem Relation Age of Onset   Diabetes Father    Hypertension Brother     ALLERGIES:  is allergic to penicillins, latex, and tape.  MEDICATIONS:  Current Outpatient Medications  Medication Sig Dispense Refill   acyclovir (ZOVIRAX) 400 MG tablet Take 1 tablet (400 mg total) by mouth 2 (two) times daily. 60 tablet 11   amLODipine (NORVASC) 10 MG tablet  Take 0.5 tablets (5 mg total) by mouth daily.     carvedilol (COREG) 6.25 MG tablet Take 1 tablet (6.25 mg total) by mouth 2 (two) times daily with a meal.     diclofenac Sodium (VOLTAREN) 1 % GEL Apply 2 g topically daily as needed (for pain).     docusate sodium (COLACE) 100 MG capsule Take 1 capsule (100 mg total) by mouth daily as needed for mild constipation.     ferrous sulfate 325 (65 FE) MG tablet Take 1 tablet (325 mg total) by mouth 2 (two) times daily as needed (iron).     finasteride (PROSCAR) 5 MG tablet Take 5 mg by mouth daily.     fluticasone (FLONASE) 50 MCG/ACT nasal  spray Place 2 sprays into both nostrils daily as needed for allergies.     furosemide (LASIX) 20 MG tablet Take 0.5 tablets (10 mg total) by mouth every other day. 30 tablet    lactulose (CHRONULAC) 10 GM/15ML solution Take 10 g by mouth daily as needed for constipation.  5   lenalidomide (REVLIMID) 10 MG capsule Take 1 capsule (10 mg total) by mouth daily. Take for 21 days on, 7 days off, repeat every 28 days. 21 capsule 0   ondansetron (ZOFRAN) 8 MG tablet Take 1 tablet (8 mg total) by mouth every 8 (eight) hours as needed for nausea or vomiting. 30 tablet 1   oxyCODONE (OXY IR/ROXICODONE) 5 MG immediate release tablet Take 5 mg by mouth every 8 (eight) hours as needed for moderate pain, severe pain or breakthrough pain.     pantoprazole (PROTONIX) 40 MG tablet Take 1 tablet (40 mg total) by mouth 2 (two) times daily. Take bid x 10 weeks, followed by daily thereafter 90 tablet 3   prochlorperazine (COMPAZINE) 10 MG tablet Take 1 tablet (10 mg total) by mouth every 6 (six) hours as needed for nausea or vomiting. 30 tablet 1   tamsulosin (FLOMAX) 0.4 MG CAPS capsule Take 1 capsule (0.4 mg total) by mouth daily after supper. 30 capsule 1   No current facility-administered medications for this visit.    REVIEW OF SYSTEMS:    10 Point review of Systems was done is negative except as noted above.  PHYSICAL EXAMINATION: ECOG PERFORMANCE STATUS: 1 - Symptomatic but completely ambulatory VSS GENERAL:alert, in no acute distress and comfortable SKIN: no acute rashes, no significant lesions EYES: conjunctiva are pink and non-injected, sclera anicteric LUNGS: clear to auscultation b/l with normal respiratory effort HEART: regular rate & rhythm Extremity: no pedal edema PSYCH: alert & oriented x 3 with fluent speech NEURO: no focal motor/sensory deficits  LABORATORY DATA:  I have reviewed the data as listed .    Latest Ref Rng & Units 07/18/2023   11:03 AM 07/11/2023    1:34 PM 07/04/2023     1:44 PM  CBC  WBC 4.0 - 10.5 K/uL 4.1  3.7  3.9   Hemoglobin 13.0 - 17.0 g/dL 8.6  8.2  8.6   Hematocrit 39.0 - 52.0 % 26.2  25.3  26.6   Platelets 150 - 400 K/uL 203  151  126    .    Latest Ref Rng & Units 07/18/2023   11:03 AM 07/11/2023    1:34 PM 07/04/2023    1:44 PM  CMP  Glucose 70 - 99 mg/dL 664  403  474   BUN 8 - 23 mg/dL 17  17  27    Creatinine 0.61 - 1.24 mg/dL 2.59  1.79  2.07   Sodium 135 - 145 mmol/L 141  142  136   Potassium 3.5 - 5.1 mmol/L 3.5  3.6  4.4   Chloride 98 - 111 mmol/L 110  112  106   CO2 22 - 32 mmol/L 25  23  24    Calcium 8.9 - 10.3 mg/dL 7.7  7.9  7.7   Total Protein 6.5 - 8.1 g/dL 5.2  5.2  5.1   Total Bilirubin 0.3 - 1.2 mg/dL 0.3  0.3  0.3   Alkaline Phos 38 - 126 U/L 46  50  43   AST 15 - 41 U/L 12  14  14    ALT 0 - 44 U/L 10  15  16       RADIOGRAPHIC STUDIES: I have personally reviewed the radiological images as listed and agreed with the findings in the report. NM PET Image Restage (PS) Whole Body  Result Date: 07/05/2023 CLINICAL DATA:  Subsequent treatment strategy for multiple myeloma. EXAM: NUCLEAR MEDICINE PET WHOLE BODY TECHNIQUE: 8.5 mCi F-18 FDG was injected intravenously. Full-ring PET imaging was performed from the head to foot after the radiotracer. CT data was obtained and used for attenuation correction and anatomic localization. Fasting blood glucose: 94 mg/dl COMPARISON:  CTA chest abdomen pelvis dated 670 this 24. FINDINGS: Mediastinal blood pool activity: SUV max 3.3 HEAD/NECK: No hypermetabolic activity in the scalp. No hypermetabolic cervical lymph nodes. Incidental CT findings: none CHEST: No hypermetabolic mediastinal or hilar nodes. No suspicious pulmonary nodules on the CT scan. Incidental CT findings: none ABDOMEN/PELVIS: No abnormal hypermetabolic activity within the liver, pancreas, adrenal glands, or spleen. No hypermetabolic lymph nodes in the abdomen or pelvis. Incidental CT findings: Status post right nephrectomy. 5.3  cm posterior left upper pole renal cyst. Right lateral abdominal hernia containing a loop of colon. Status post TURP. SKELETON: Lytic lesion at T6, max SUV 3.0. Lytic lesion in the left iliac bone, max SUV 3.0. Incidental CT findings: Fracture deformities of the bilateral medial clavicles with max SUV 4.8. Degenerative changes of the visualized thoracolumbar spine. EXTREMITIES: Mild linear are metabolism in the left proximal femoral metaphysis, max SUV 3.4, without corresponding lesion to suggest metastasis. Incidental CT findings: none IMPRESSION: Active myeloma involving T6 in the left iliac bone. Additional ancillary findings as above. Electronically Signed   By: Charline Bills M.D.   On: 07/05/2023 00:59    ASSESSMENT & PLAN:  Marcus Hatfield is a 77 y.o. male who presents to the clinic for continued management for multiple myeloma.   #IgG kappa multiple myeloma:  -See oncologic history as above.  -Current treatment includes Daratumumab/Revlimid/Dexamethasone, started on 06/13/2023. -Revlimid on HOLD starting 06/19/2023 since patient is unable to take aspirin therapy due to recent GI bleeding.   #GI bleeding: --Admitted from 06/05/2023-06/06/2023.EGD showed nonbleeding duodenal ulcers. Received 2 units of PRBC. GI recommended PPI twice daily x 10 weeks and then daily thereafter. 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 --Vision has improved after completion of radiation therapy.  --Monitor for now.   #History of multifocal renal cell carcinoma --Underwent right nephrectomy on 02/03/2016.  He received cryotherapy to his left renal mass in April 2017. --Underwent laparoscopic left partial nephrectomy and simple prostatectomy with Dr. Berneice Heinrich on 07/07/2021. Path revealed papillary renal cell  carcinoma, type I, nuclear grade 2 with infarction and  chronic inflammation. Tumor is limited to the kidney (pT1a).   PLAN: -Due for Cycle 2, Day 8 of Dara/Dex today -Labs from today were reviewed and adequate for treatment. WBC 4.1, Hgb 8.6, Plt 203, Creatinine stable at 1.68, LFT in range.  -Most recent myeloma labs from 07/11/2023 showed improvement of M protein measuring 0.3 g/dL. Kappa/Lambda light chains normal.  -Reviewed PET scan from 06/29/2023 that showed active myeloma involving T6 in the left iliac bone. Monitor for now.  -Proceed with treatment today without any dose modifications. -Continue to hold Revlimid since patient is unable to take aspirin therapy for VTE risk due to history of GI bleed.  -Awaiting dental clearance for Zometa infusion.  -Continue with acyclovir 400 mg BID for shingles prophylaxis.   FOLLOW-UP: Per integrated scheduling  All of the patient's questions were answered with apparent satisfaction. The patient knows to call the clinic with any problems, questions or concerns.  I have spent a total of 30 minutes minutes of face-to-face and non-face-to-face time, preparing to see the patient, performing a medically appropriate examination, counseling and educating the patient,documenting clinical information in the electronic health record, independently interpreting results and communicating results to the patient, and care coordination.   Georga Kaufmann PA-C Dept of Hematology and Oncology [facility] Phone: [phone]

## 2023-07-19 DIAGNOSIS — H4720 Unspecified optic atrophy: Secondary | ICD-10-CM | POA: Diagnosis not present

## 2023-07-25 ENCOUNTER — Inpatient Hospital Stay: Payer: Medicare HMO | Attending: Hematology

## 2023-07-25 ENCOUNTER — Inpatient Hospital Stay: Payer: Medicare HMO

## 2023-07-25 VITALS — BP 116/75 | HR 82 | Temp 98.6°F | Resp 16

## 2023-07-25 DIAGNOSIS — Z7189 Other specified counseling: Secondary | ICD-10-CM

## 2023-07-25 DIAGNOSIS — Z5112 Encounter for antineoplastic immunotherapy: Secondary | ICD-10-CM | POA: Diagnosis not present

## 2023-07-25 DIAGNOSIS — C9000 Multiple myeloma not having achieved remission: Secondary | ICD-10-CM | POA: Diagnosis not present

## 2023-07-25 DIAGNOSIS — C9002 Multiple myeloma in relapse: Secondary | ICD-10-CM

## 2023-07-25 LAB — CBC WITH DIFFERENTIAL (CANCER CENTER ONLY)
Abs Immature Granulocytes: 0.04 10*3/uL (ref 0.00–0.07)
Basophils Absolute: 0.1 10*3/uL (ref 0.0–0.1)
Basophils Relative: 1 %
Eosinophils Absolute: 0 10*3/uL (ref 0.0–0.5)
Eosinophils Relative: 1 %
HCT: 28.4 % — ABNORMAL LOW (ref 39.0–52.0)
Hemoglobin: 9.1 g/dL — ABNORMAL LOW (ref 13.0–17.0)
Immature Granulocytes: 1 %
Lymphocytes Relative: 39 %
Lymphs Abs: 2.4 10*3/uL (ref 0.7–4.0)
MCH: 31 pg (ref 26.0–34.0)
MCHC: 32 g/dL (ref 30.0–36.0)
MCV: 96.6 fL (ref 80.0–100.0)
Monocytes Absolute: 0.8 10*3/uL (ref 0.1–1.0)
Monocytes Relative: 14 %
Neutro Abs: 2.8 10*3/uL (ref 1.7–7.7)
Neutrophils Relative %: 44 %
Platelet Count: 243 10*3/uL (ref 150–400)
RBC: 2.94 MIL/uL — ABNORMAL LOW (ref 4.22–5.81)
RDW: 17.7 % — ABNORMAL HIGH (ref 11.5–15.5)
WBC Count: 6.1 10*3/uL (ref 4.0–10.5)
nRBC: 0 % (ref 0.0–0.2)

## 2023-07-25 LAB — COMPREHENSIVE METABOLIC PANEL
ALT: 8 U/L (ref 0–44)
AST: 11 U/L — ABNORMAL LOW (ref 15–41)
Albumin: 3.2 g/dL — ABNORMAL LOW (ref 3.5–5.0)
Alkaline Phosphatase: 49 U/L (ref 38–126)
Anion gap: 5 (ref 5–15)
BUN: 17 mg/dL (ref 8–23)
CO2: 26 mmol/L (ref 22–32)
Calcium: 7.7 mg/dL — ABNORMAL LOW (ref 8.9–10.3)
Chloride: 111 mmol/L (ref 98–111)
Creatinine, Ser: 1.65 mg/dL — ABNORMAL HIGH (ref 0.61–1.24)
GFR, Estimated: 43 mL/min — ABNORMAL LOW (ref >60)
Glucose, Bld: 111 mg/dL — ABNORMAL HIGH (ref 70–99)
Potassium: 3.9 mmol/L (ref 3.5–5.1)
Sodium: 142 mmol/L (ref 135–145)
Total Bilirubin: 0.3 mg/dL (ref 0.3–1.2)
Total Protein: 5.6 g/dL — ABNORMAL LOW (ref 6.5–8.1)

## 2023-07-25 MED ORDER — DEXAMETHASONE 4 MG PO TABS
12.0000 mg | ORAL_TABLET | Freq: Once | ORAL | Status: AC
  Administered 2023-07-25: 12 mg via ORAL
  Filled 2023-07-25: qty 3

## 2023-07-25 MED ORDER — DIPHENHYDRAMINE HCL 25 MG PO CAPS
50.0000 mg | ORAL_CAPSULE | Freq: Once | ORAL | Status: AC
  Administered 2023-07-25: 50 mg via ORAL
  Filled 2023-07-25: qty 2

## 2023-07-25 MED ORDER — ACETAMINOPHEN 325 MG PO TABS
650.0000 mg | ORAL_TABLET | Freq: Once | ORAL | Status: AC
  Administered 2023-07-25: 650 mg via ORAL
  Filled 2023-07-25: qty 2

## 2023-07-25 MED ORDER — FAMOTIDINE 20 MG PO TABS
20.0000 mg | ORAL_TABLET | Freq: Once | ORAL | Status: AC
  Administered 2023-07-25: 20 mg via ORAL
  Filled 2023-07-25: qty 1

## 2023-07-25 MED ORDER — DARATUMUMAB-HYALURONIDASE-FIHJ 1800-30000 MG-UT/15ML ~~LOC~~ SOLN
1800.0000 mg | Freq: Once | SUBCUTANEOUS | Status: AC
  Administered 2023-07-25: 1800 mg via SUBCUTANEOUS
  Filled 2023-07-25: qty 15

## 2023-07-25 NOTE — Patient Instructions (Signed)
[place] [facility]  Discharge Instructions: Thank you for choosing [facility] to provide your oncology and hematology care.   If you have a lab appointment with the Cancer Center, please go directly to the Cancer Center and check in at the registration area.   Wear comfortable clothing and clothing appropriate for easy access to any Portacath or PICC line.   We strive to give you quality time with your provider. You may need to reschedule your appointment if you arrive late (15 or more minutes).  Arriving late affects you and other patients whose appointments are after yours.  Also, if you miss three or more appointments without notifying the office, you may be dismissed from the clinic at the provider's discretion.      For prescription refill requests, have your pharmacy contact our office and allow 72 hours for refills to be completed.    Today you received the following chemotherapy and/or immunotherapy agent: Dara Faspro.      To help prevent nausea and vomiting after your treatment, we encourage you to take your nausea medication as directed.  BELOW ARE SYMPTOMS THAT SHOULD BE REPORTED IMMEDIATELY: *FEVER GREATER THAN 100.4 F (38 C) OR HIGHER *CHILLS OR SWEATING *NAUSEA AND VOMITING THAT IS NOT CONTROLLED WITH YOUR NAUSEA MEDICATION *UNUSUAL SHORTNESS OF BREATH *UNUSUAL BRUISING OR BLEEDING *URINARY PROBLEMS (pain or burning when urinating, or frequent urination) *BOWEL PROBLEMS (unusual diarrhea, constipation, pain near the anus) TENDERNESS IN MOUTH AND THROAT WITH OR WITHOUT PRESENCE OF ULCERS (sore throat, sores in mouth, or a toothache) UNUSUAL RASH, SWELLING OR PAIN  UNUSUAL VAGINAL DISCHARGE OR ITCHING   Items with * indicate a potential emergency and should be followed up as soon as possible or go to the Emergency Department if any problems should occur.  Please show the CHEMOTHERAPY ALERT CARD or IMMUNOTHERAPY ALERT CARD at  check-in to the Emergency Department and triage nurse.  Should you have questions after your visit or need to cancel or reschedule your appointment, please contact [place] [facility]  Dept: [phone]  and follow the prompts.  Office hours are 8:00 a.m. to 4:30 p.m. Monday - Friday. Please note that voicemails left after 4:00 p.m. may not be returned until the following business day.  We are closed weekends and major holidays. You have access to a nurse at all times for urgent questions. Please call the main number to the clinic Dept: [phone] and follow the prompts.   For any non-urgent questions, you may also contact your provider using MyChart. We now offer e-Visits for anyone 14 and older to request care online for non-urgent symptoms. For details visit [email or web address].   Also download the MyChart app! Go to the app store, search "MyChart", open the app, select [place], and log in with your MyChart username and password.

## 2023-07-27 ENCOUNTER — Ambulatory Visit (INDEPENDENT_AMBULATORY_CARE_PROVIDER_SITE_OTHER): Payer: Medicare HMO | Admitting: Nurse Practitioner

## 2023-07-27 ENCOUNTER — Encounter: Payer: Self-pay | Admitting: Nurse Practitioner

## 2023-07-27 VITALS — BP 94/60 | HR 92 | Ht 70.0 in | Wt 175.0 lb

## 2023-07-27 DIAGNOSIS — K2970 Gastritis, unspecified, without bleeding: Secondary | ICD-10-CM

## 2023-07-27 DIAGNOSIS — K269 Duodenal ulcer, unspecified as acute or chronic, without hemorrhage or perforation: Secondary | ICD-10-CM | POA: Diagnosis not present

## 2023-07-27 NOTE — Patient Instructions (Signed)
Continue Pantoprazole (Protonix) 40mg  one capsule by mouth twice daily until August 08, 2023 then reduce Pantoprazole 40mg  to one capsule once daily to be taken 30 minutes for breakfast indefinitely.   Follow up CBC with your oncologist   Contact our office if you pass any black stools or red blood from the rectum   Follow up with your primary care physician regarding the swelling in your legs

## 2023-07-27 NOTE — Progress Notes (Signed)
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   He was admitted to the hospital 6/17 - 06/06/2023 with melena.  Admission Hg level 6.1. He was transfused 2 units of PRBCs. He underwent an EGD by Dr. [name] which showed nonbleeding duodenal ulcers.  Biopsies were negative for H. pylori or malignancy.  He was instructed to take Pantoprazole 40 mg twice daily for 10 weeks then once daily thereafter.  Hg level was 8.1 at time of discharge. Aspirin remains on hold.  He presents today for further GI follow-up.  He is accompanied by his wife.  He denies having nausea or vomiting. No abdominal pain. No heartburn or dysphagia. He is passing a brown stool daily. No rectal bleeding or black stools.  He takes Ferrous Sulfate 2 to 3 days weekly.  He stated his most recent colonoscopy by Dr. Bosie Clos with [facility] was more than 10 years ago and was normal.  He stated his initial colonoscopy showed a few benign polyps which were removed from the colon.  Upper endoscopy 06/06/2023: - Normal esophagus.  - Two gastric polyps. Biopsied.  - Gastritis. Biopsied.  - Non-bleeding duodenal ulcers with a clean ulcer base (Forrest Class III). Biopsied.  - Non-bleeding duodenal diverticulum.  - Submucosal nodule found in the duodenum. Biopsied.  A. DUODENUM, NODULE, BIOPSY: - Polypoid duodenal mucosa with focal features suggestive of chronic peptic duodenitis - Negative for dysplasia, plasma cell neoplasm or malignancy - See comment  B.  DUODENUM, ULCER, BIOPSY: - Duodenal mucosa with ulceration, Brunner's gland hyperplasia, reactive changes, acute and chronic inflammation, and gastric cell metaplasia consistent with acute peptic duodenitis - Negative for dysplasia, plasma cell neoplasm or malignancy - See comment  C. STOMACH, BIOPSY: - Antral and oxyntic mucosa with mild chronic, inactive, nonspecific gastritis - Immunohistochemical stain for Helicobacter organisms is negative - Negative for dysplasia, plasma cell neoplasm or malignancy  D. STOMACH, POLYP, BIOPSY: - Hyperplastic polyp (multiple fragments) - Negative for dysplasia, plasma cell neoplasm or malignancy  COMMENT: Parts A and D: Immunohistochemical stains are performed on blocks A and D.  Mum 1 highlights scattered plasma cells.  Kappa and Lambda-ISH reveal a polytypic population.  This is consistent with the above diagnoses.      Latest Ref Rng & Units 07/25/2023    1:51 PM 07/18/2023   11:03 AM 07/11/2023    1:34 PM  CBC  WBC 4.0 - 10.5 K/uL 6.1  4.1  3.7   Hemoglobin 13.0 - 17.0 g/dL 9.1  8.6  8.2   Hematocrit 39.0 - 52.0 % 28.4  26.2  25.3   Platelets 150 - 400 K/uL 243  203  151         Latest Ref Rng & Units 07/25/2023    1:51 PM 07/18/2023   11:03 AM 07/11/2023    1:34 PM  CMP  Glucose 70 - 99 mg/dL 425  956  387   BUN 8 - 23 mg/dL 17  17  17    Creatinine 0.61 - 1.24 mg/dL 5.64  3.32  9.51   Sodium 135 -  145 mmol/L 142  141  142   Potassium 3.5 - 5.1 mmol/L 3.9  3.5  3.6   Chloride 98 - 111 mmol/L 111  110  112   CO2 22 - 32 mmol/L 26  25  23    Calcium 8.9 - 10.3 mg/dL 7.7  7.7  7.9   Total Protein 6.5 - 8.1 g/dL 5.6  5.2  5.2   Total Bilirubin 0.3 - 1.2 mg/dL 0.3  0.3  0.3   Alkaline Phos 38 - 126 U/L 49  46  50   AST 15 - 41 U/L 11  12  14    ALT 0 - 44 U/L 8  10  15      PET SCAN 07/05/2023:  EXAM: NUCLEAR MEDICINE PET WHOLE BODY   TECHNIQUE: 8.5 mCi F-18 FDG was injected intravenously. Full-ring PET imaging was performed from  the head to foot after the radiotracer. CT data was obtained and used for attenuation correction and anatomic localization.   Fasting blood glucose: 94 mg/dl   COMPARISON:  CTA chest abdomen pelvis dated 670 this 24.   FINDINGS: Mediastinal blood pool activity: SUV max 3.3   HEAD/NECK: No hypermetabolic activity in the scalp. No hypermetabolic cervical lymph nodes.   Incidental CT findings: none   CHEST: No hypermetabolic mediastinal or hilar nodes. No suspicious pulmonary nodules on the CT scan.   Incidental CT findings: none   ABDOMEN/PELVIS: No abnormal hypermetabolic activity within the liver, pancreas, adrenal glands, or spleen. No hypermetabolic lymph nodes in the abdomen or pelvis.   Incidental CT findings: Status post right nephrectomy. 5.3 cm posterior left upper pole renal cyst. Right lateral abdominal hernia containing a loop of colon. Status post TURP.   SKELETON: Lytic lesion at T6, max SUV 3.0. Lytic lesion in the left iliac bone, max SUV 3.0.   Incidental CT findings: Fracture deformities of the bilateral medial clavicles with max SUV 4.8. Degenerative changes of the visualized thoracolumbar spine.   EXTREMITIES: Mild linear are metabolism in the left proximal femoral metaphysis, max SUV 3.4, without corresponding lesion to suggest metastasis.   Incidental CT findings: none   IMPRESSION: Active myeloma involving T6 in the left iliac bone.   Additional ancillary findings as above.     Current Outpatient Medications on File Prior to Visit  Medication Sig Dispense Refill   acyclovir (ZOVIRAX) 400 MG tablet Take 1 tablet (400 mg total) by mouth 2 (two) times daily. 60 tablet 11   amLODipine (NORVASC) 10 MG tablet Take 0.5 tablets (5 mg total) by mouth daily.     carvedilol (COREG) 6.25 MG tablet Take 1 tablet (6.25 mg total) by mouth 2 (two) times daily with a meal.     diclofenac Sodium (VOLTAREN) 1 % GEL Apply 2 g topically daily as needed (for pain).      docusate sodium (COLACE) 100 MG capsule Take 1 capsule (100 mg total) by mouth daily as needed for mild constipation.     ferrous sulfate 325 (65 FE) MG tablet Take 1 tablet (325 mg total) by mouth 2 (two) times daily as needed (iron).     finasteride (PROSCAR) 5 MG tablet Take 5 mg by mouth daily.     fluticasone (FLONASE) 50 MCG/ACT nasal spray Place 2 sprays into both nostrils daily as needed for allergies.     furosemide (LASIX) 20 MG tablet Take 0.5 tablets (10 mg total) by mouth every other day. 30 tablet    lactulose (CHRONULAC) 10 GM/15ML solution Take 10  g by mouth daily as needed for constipation.  5   oxyCODONE (OXY IR/ROXICODONE) 5 MG immediate release tablet Take 5 mg by mouth every 8 (eight) hours as needed for moderate pain, severe pain or breakthrough pain.     pantoprazole (PROTONIX) 40 MG tablet Take 1 tablet (40 mg total) by mouth 2 (two) times daily. Take bid x 10 weeks, followed by daily thereafter 90 tablet 3   prochlorperazine (COMPAZINE) 10 MG tablet Take 1 tablet (10 mg total) by mouth every 6 (six) hours as needed for nausea or vomiting. 30 tablet 1   tamsulosin (FLOMAX) 0.4 MG CAPS capsule Take 1 capsule (0.4 mg total) by mouth daily after supper. 30 capsule 1   lenalidomide (REVLIMID) 10 MG capsule Take 1 capsule (10 mg total) by mouth daily. Take for 21 days on, 7 days off, repeat every 28 days. (Patient not taking: Reported on 07/18/2023) 21 capsule 0   ondansetron (ZOFRAN) 8 MG tablet Take 1 tablet (8 mg total) by mouth every 8 (eight) hours as needed for nausea or vomiting. (Patient not taking: Reported on 07/27/2023) 30 tablet 1   No current facility-administered medications on file prior to visit.   Allergies  Allergen Reactions   Penicillins Hives    Has patient had a PCN reaction causing immediate rash, facial/tongue/throat swelling, SOB or lightheadedness with hypotension:Yes Has patient had a PCN reaction causing severe rash involving mucus membranes or skin  necrosis: Yes Has patient had a PCN reaction that required hospitalization No Has patient had a PCN reaction occurring within the last 10 years: No If all of the above answers are "NO", then may proceed with Cephalosporin use.    Latex Rash   Tape Rash    adhesive   Current Medications, Allergies, Past Medical History, Past Surgical History, Family History and Social History were reviewed in [employer] record.  Review of Systems:   Constitutional: Negative for fever, sweats, chills or weight loss.  Respiratory: Negative for shortness of breath.   Cardiovascular: + Leg swelling. No chest pain. Gastrointestinal: See HPI.  Musculoskeletal: Negative for back pain or muscle aches.  Neurological: Negative for dizziness, headaches or paresthesias.   Physical Exam: BP 94/60 (BP Location: Left Arm, Patient Position: Sitting, Cuff Size: Normal)   Pulse 92   Ht 5\' 10"  (1.778 m) Comment: height measured without shoes  Wt 175 lb (79.4 kg)   BMI 25.11 kg/m   General: 77 year old male in no acute distress. Head: Normocephalic and atraumatic. Eyes: No scleral icterus. Conjunctiva pink . Ears: Normal auditory acuity. Mouth: Dentition intact. No ulcers or lesions.  Lungs: Clear throughout to auscultation. Heart: Regular rate and rhythm, no murmur. Abdomen: Soft, nontender and nondistended. No masses or hepatomegaly. Normal bowel sounds x 4 quadrants.  Rectal: Deferred. Musculoskeletal: Symmetrical with no gross deformities. Extremities: No edema. Neurological: Alert oriented x 4. No focal deficits.  Psychological: Alert and cooperative. Normal mood and affect  Assessment and Recommendations:  77 year old male admitted to the hospital 06/05/2023 with UGI bleed/melena and acute on chronic anemia. Admission Hg level 6.1. Transfused 2 units of PRBCs. EGD 06/06/2023 identified nonbleeding duodenal ulcers, gastritis and 2 hyperplastic gastric polyps.  Biopsies were negative  for H. pylori and no evidence of dysplasia/malignancy.  No further melena.  On ASA.  He takes Ferrous Sulfate 325 mg 2 to 3 days weekly.  Hemoglobin level 9.1 on 07/25/2023.  -Patient instructed to continue Pantoprazole 40 mg twice daily until 08/08/2023 then  once daily thereafter  -Repeat EGD deferred unless patient demonstrates recurrent active GI bleeding,  to discuss further with Dr. Eulah Pont  -Patient to contact our office if he passes any black stools or blood from the rectum -Continue CBC follow-up with oncology  Multiple myeloma initially diagnosed in 2016 with skull base plasmacytoma    Today's encounter was 25 minutes which included precharting, chart/result review, history/exam, face-to-face time used for counseling, formulating a treatment plan with follow-up and documentation.

## 2023-07-27 NOTE — Progress Notes (Signed)
I agree with the assessment and plan as outlined by Ms. Riley Kill. No need for follow up EGD unless patient's anemia were to worsen again or if patient were to have any clinical evidence of recurrent bleeding.

## 2023-08-01 ENCOUNTER — Other Ambulatory Visit: Payer: Self-pay

## 2023-08-01 ENCOUNTER — Inpatient Hospital Stay: Payer: Medicare HMO

## 2023-08-01 ENCOUNTER — Inpatient Hospital Stay (HOSPITAL_BASED_OUTPATIENT_CLINIC_OR_DEPARTMENT_OTHER): Payer: Medicare HMO | Admitting: Hematology

## 2023-08-01 VITALS — BP 127/77 | HR 92 | Temp 100.0°F | Resp 18 | Wt 171.8 lb

## 2023-08-01 VITALS — BP 101/73 | HR 77 | Temp 98.7°F | Resp 16

## 2023-08-01 DIAGNOSIS — Z7189 Other specified counseling: Secondary | ICD-10-CM

## 2023-08-01 DIAGNOSIS — C9000 Multiple myeloma not having achieved remission: Secondary | ICD-10-CM | POA: Diagnosis not present

## 2023-08-01 DIAGNOSIS — Z5112 Encounter for antineoplastic immunotherapy: Secondary | ICD-10-CM | POA: Diagnosis not present

## 2023-08-01 DIAGNOSIS — C9002 Multiple myeloma in relapse: Secondary | ICD-10-CM

## 2023-08-01 LAB — COMPREHENSIVE METABOLIC PANEL
ALT: 7 U/L (ref 0–44)
AST: 11 U/L — ABNORMAL LOW (ref 15–41)
Albumin: 3.4 g/dL — ABNORMAL LOW (ref 3.5–5.0)
Alkaline Phosphatase: 47 U/L (ref 38–126)
Anion gap: 5 (ref 5–15)
BUN: 14 mg/dL (ref 8–23)
CO2: 28 mmol/L (ref 22–32)
Calcium: 7.9 mg/dL — ABNORMAL LOW (ref 8.9–10.3)
Chloride: 112 mmol/L — ABNORMAL HIGH (ref 98–111)
Creatinine, Ser: 1.69 mg/dL — ABNORMAL HIGH (ref 0.61–1.24)
GFR, Estimated: 42 mL/min — ABNORMAL LOW (ref >60)
Glucose, Bld: 80 mg/dL (ref 70–99)
Potassium: 3.7 mmol/L (ref 3.5–5.1)
Sodium: 145 mmol/L (ref 135–145)
Total Bilirubin: 0.4 mg/dL (ref 0.3–1.2)
Total Protein: 5.8 g/dL — ABNORMAL LOW (ref 6.5–8.1)

## 2023-08-01 LAB — CBC WITH DIFFERENTIAL (CANCER CENTER ONLY)
Abs Immature Granulocytes: 0.02 10*3/uL (ref 0.00–0.07)
Basophils Absolute: 0.1 10*3/uL (ref 0.0–0.1)
Basophils Relative: 1 %
Eosinophils Absolute: 0.1 10*3/uL (ref 0.0–0.5)
Eosinophils Relative: 1 %
HCT: 31.4 % — ABNORMAL LOW (ref 39.0–52.0)
Hemoglobin: 10.2 g/dL — ABNORMAL LOW (ref 13.0–17.0)
Immature Granulocytes: 0 %
Lymphocytes Relative: 34 %
Lymphs Abs: 2.1 10*3/uL (ref 0.7–4.0)
MCH: 31.8 pg (ref 26.0–34.0)
MCHC: 32.5 g/dL (ref 30.0–36.0)
MCV: 97.8 fL (ref 80.0–100.0)
Monocytes Absolute: 0.9 10*3/uL (ref 0.1–1.0)
Monocytes Relative: 14 %
Neutro Abs: 3 10*3/uL (ref 1.7–7.7)
Neutrophils Relative %: 50 %
Platelet Count: 200 10*3/uL (ref 150–400)
RBC: 3.21 MIL/uL — ABNORMAL LOW (ref 4.22–5.81)
RDW: 17.2 % — ABNORMAL HIGH (ref 11.5–15.5)
WBC Count: 6.1 10*3/uL (ref 4.0–10.5)
nRBC: 0 % (ref 0.0–0.2)

## 2023-08-01 MED ORDER — FAMOTIDINE 20 MG PO TABS
20.0000 mg | ORAL_TABLET | Freq: Once | ORAL | Status: AC
  Administered 2023-08-01: 20 mg via ORAL
  Filled 2023-08-01: qty 1

## 2023-08-01 MED ORDER — DEXAMETHASONE 4 MG PO TABS
12.0000 mg | ORAL_TABLET | Freq: Once | ORAL | Status: AC
  Administered 2023-08-01: 12 mg via ORAL
  Filled 2023-08-01: qty 3

## 2023-08-01 MED ORDER — DIPHENHYDRAMINE HCL 25 MG PO CAPS
50.0000 mg | ORAL_CAPSULE | Freq: Once | ORAL | Status: AC
  Administered 2023-08-01: 50 mg via ORAL
  Filled 2023-08-01: qty 2

## 2023-08-01 MED ORDER — ACETAMINOPHEN 325 MG PO TABS
650.0000 mg | ORAL_TABLET | Freq: Once | ORAL | Status: AC
  Administered 2023-08-01: 650 mg via ORAL
  Filled 2023-08-01: qty 2

## 2023-08-01 MED ORDER — DARATUMUMAB-HYALURONIDASE-FIHJ 1800-30000 MG-UT/15ML ~~LOC~~ SOLN
1800.0000 mg | Freq: Once | SUBCUTANEOUS | Status: AC
  Administered 2023-08-01: 1800 mg via SUBCUTANEOUS
  Filled 2023-08-01: qty 15

## 2023-08-01 NOTE — Progress Notes (Signed)
Patient seen by Dr. Addison Naegeli are within treatment parameters.  Labs reviewed: and are not all within treatment parameters. Dr Candise Che aware C: 1.69,   Per physician team, patient is ready for treatment and there are NO modifications to the treatment plan.

## 2023-08-01 NOTE — Progress Notes (Signed)
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  Patient was seen by me as an inpatient on 05/20/2023.  He noted that his vision may be slightly better with the steroid. Had some mild insomnia but it was not too bothersome. Has been seen by radiation oncology and is going to be set up for CT simulation and to start radiation 05/21/2023.  Did have a CT chest abdomen pelvis without contrast to evaluate for any other soft tissue metastatic disease and whole-body skeletal survey to determine other overt progression and to rule out the possibility of further source of metastatic disease.  Today, he is accompanied by two family members. He reports that he is feeling well overall. He reports that his vision has improved and returned to baseline. He denies any glares in his vision which were previously present. He is able to count fingers and notes improved sight of colors on television.   His p.o. intake is normal and he notes drinking 4 16oz bottles daily. He denies any fevers, chills, night sweats, new back pain, fatigue, posterior neck pain, or abdominal pain.  He reports some nausea with previously taking Revlimid. Patient reports that he has an upcoming dental appointment.  INTERVAL HISTORY:  Marcus Hatfield is a wonderful 77 y.o. 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 He is here to start cycle 2 day 22 of his treatment.   Patient was last seen by PA Thayil on 07/18/2023 and he complained of fatigue and dry mouth.   Patient is accompanied by his wife during this  visit. He notes he has been doing well overall without any new or severe medical concerns since our last visit. He denies of any recent GI bleeding. Patient's last visit with Dr. [name] was on 07/27/2023 and repeat colonoscopy is not planned.   Patient notes he had a recent appointment with his dentist and notes he does not need any dental procedure.   Patient complains of intermittent bilateral leg swelling. He notes his vision is improving, but still has intermittent fuzzy vision in his left eye. He regularly follows up with his Ophthalmologist.   He denies any new infection issues, fever, chills, night sweats, headaches, dizziness, bone pain, chest pain, swallowing problems, neck pain, enlarged lymph nodes, back pain, abdominal pain, unexpected weight loss, or leg swelling. He does complains of intermittent right collar bone pain and sometimes feeling cold.   Patient's wife notes that the patient sometimes has memory problems, such as forgetting the date. She contributes memory problem to medication.   Patient notes he has been tolerating his treatment well without any new or severe toxicities.   He takes iron supplement around 1-2 times a week.   MEDICAL HISTORY:  Past Medical History:  Diagnosis Date   Anemia    Arthritis    CKD (chronic kidney disease) stage 3, GFR 30-59 ml/min (HCC) 20015   Creat 1.9   Hypertension    Multiple myeloma (HCC) 2016   [facility] heme onc   Prostate disorder 02/2017    SURGICAL HISTORY: Past Surgical History:  Procedure Laterality Date   BIOPSY  06/06/2023   Procedure: BIOPSY;  Surgeon: [name], MD;  Location: [facility] ENDOSCOPY;  Service: Gastroenterology;;   ESOPHAGOGASTRODUODENOSCOPY (EGD) WITH PROPOFOL N/A 06/06/2023   Procedure: ESOPHAGOGASTRODUODENOSCOPY (EGD) WITH PROPOFOL;  Surgeon: [name], MD;  Location: [facility] ENDOSCOPY;  Service: Gastroenterology;  Laterality: N/A;   EYE SURGERY Bilateral 01/30/2018   KNEE SURGERY     over ten years ago    NEPHRECTOMY Right 03/2015   ROBOTIC ASSITED PARTIAL NEPHRECTOMY Left 07/07/2021   Procedure: XI ROBOTIC ASSITED PARTIAL NEPHRECTOMY;  Surgeon: [name], MD;  Location: [facility] ORS;  Service: Urology;  Laterality: Left;  5 HRS   XI ROBOTIC ASSISTED SIMPLE PROSTATECTOMY N/A 07/07/2021   Procedure: XI ROBOTIC ASSISTED SIMPLE PROSTATECTOMY;  Surgeon: [name], MD;  Location: [facility] ORS;  Service: Urology;  Laterality: N/A;    SOCIAL HISTORY: Social History   Socioeconomic History   Marital status: Married    Spouse name: Museum/gallery curator   Number of children: 2   Years of education: Not on file   Highest education level: Not on file  Occupational History   Occupation: Retired  Tobacco Use   Smoking status: Never   Smokeless tobacco: Never  Vaping Use   Vaping status: Never Used  Substance and Sexual Activity   Alcohol use: No   Drug use: No   Sexual activity: Not on file  Other Topics Concern   Not on file  Social History Narrative   Not on file   Social Determinants of Health   Financial Resource Strain: Not on file  Food Insecurity: Patient Declined (06/05/2023)   Hunger Vital Sign    Worried About Running Out of Food in the Last Year: Patient declined    Ran Out of Food in the Last Year: Patient declined  Transportation Needs: Patient Declined (06/05/2023)   PRAPARE - Administrator, Civil Service (Medical): Patient declined    Lack of Transportation (Non-Medical): Patient declined  Physical Activity: Not on file  Stress: Not on file  Social Connections: Not on file  Intimate Partner Violence: Patient Declined (06/05/2023)   Humiliation, Afraid, Rape, and Kick questionnaire    Fear of Current or Ex-Partner: Patient declined    Emotionally Abused: Patient declined    Physically Abused: Patient declined    Sexually Abused: Patient declined    FAMILY HISTORY: Family History  Problem Relation Age of Onset   Diabetes Father    Hypertension Brother      ALLERGIES:  is allergic to penicillins, latex, and tape.  MEDICATIONS:  Current Outpatient Medications  Medication Sig Dispense Refill   acyclovir (ZOVIRAX) 400 MG tablet Take 1 tablet (400 mg total) by mouth 2 (two) times daily. 60 tablet 11   amLODipine (NORVASC) 10 MG tablet Take 0.5 tablets (5 mg total) by mouth daily.     carvedilol (COREG) 6.25 MG tablet Take 1 tablet (6.25 mg total) by mouth 2 (two) times daily with a meal.     diclofenac Sodium (VOLTAREN) 1 % GEL Apply 2 g topically daily as needed (for pain).     docusate sodium (COLACE) 100 MG capsule Take 1 capsule (100 mg total) by mouth daily as needed for mild constipation.     ferrous sulfate 325 (65 FE) MG tablet Take 1 tablet (325 mg total) by mouth 2 (two) times daily as needed (iron).     finasteride (PROSCAR) 5 MG tablet Take 5 mg by mouth daily.     fluticasone (FLONASE) 50 MCG/ACT nasal  spray Place 2 sprays into both nostrils daily as needed for allergies.     furosemide (LASIX) 20 MG tablet Take 0.5 tablets (10 mg total) by mouth every other day. 30 tablet    lactulose (CHRONULAC) 10 GM/15ML solution Take 10 g by mouth daily as needed for constipation.  5   lenalidomide (REVLIMID) 10 MG capsule Take 1 capsule (10 mg total) by mouth daily. Take for 21 days on, 7 days off, repeat every 28 days. (Patient not taking: Reported on 07/18/2023) 21 capsule 0   ondansetron (ZOFRAN) 8 MG tablet Take 1 tablet (8 mg total) by mouth every 8 (eight) hours as needed for nausea or vomiting. (Patient not taking: Reported on 07/27/2023) 30 tablet 1   oxyCODONE (OXY IR/ROXICODONE) 5 MG immediate release tablet Take 5 mg by mouth every 8 (eight) hours as needed for moderate pain, severe pain or breakthrough pain.     pantoprazole (PROTONIX) 40 MG tablet Take 1 tablet (40 mg total) by mouth 2 (two) times daily. Take bid x 10 weeks, followed by daily thereafter 90 tablet 3   prochlorperazine (COMPAZINE) 10 MG tablet Take 1 tablet (10 mg total)  by mouth every 6 (six) hours as needed for nausea or vomiting. 30 tablet 1   tamsulosin (FLOMAX) 0.4 MG CAPS capsule Take 1 capsule (0.4 mg total) by mouth daily after supper. 30 capsule 1   No current facility-administered medications for this visit.    REVIEW OF SYSTEMS:    10 Point review of Systems was done is negative except as noted above.  PHYSICAL EXAMINATION: ECOG PERFORMANCE STATUS: 2 - Symptomatic, <50% confined to bed .BP 127/77   Pulse 92   Temp 100 F (37.8 C)   Resp 18   Wt 171 lb 12.8 oz (77.9 kg)   SpO2 100%   BMI 24.65 kg/m   GENERAL:alert, in no acute distress and comfortable SKIN: no acute rashes, no significant lesions EYES: conjunctiva are pink and non-injected, sclera anicteric OROPHARYNX: MMM, no exudates, no oropharyngeal erythema or ulceration NECK: supple, no JVD LYMPH:  no palpable lymphadenopathy in the cervical, axillary or inguinal regions LUNGS: clear to auscultation b/l with normal respiratory effort HEART: regular rate & rhythm ABDOMEN:  normoactive bowel sounds , non tender, not distended. Extremity: no pedal edema PSYCH: alert & oriented x 3 with fluent speech NEURO: no focal motor/sensory deficits  LABORATORY DATA:  I have reviewed the data as listed .    Latest Ref Rng & Units 08/01/2023   10:41 AM 07/25/2023    1:51 PM 07/18/2023   11:03 AM  CBC  WBC 4.0 - 10.5 K/uL 6.1  6.1  4.1   Hemoglobin 13.0 - 17.0 g/dL 78.2  9.1  8.6   Hematocrit 39.0 - 52.0 % 31.4  28.4  26.2   Platelets 150 - 400 K/uL 200  243  203    .    Latest Ref Rng & Units 08/01/2023   10:41 AM 07/25/2023    1:51 PM 07/18/2023   11:03 AM  CMP  Glucose 70 - 99 mg/dL 80  956  213   BUN 8 - 23 mg/dL 14  17  17    Creatinine 0.61 - 1.24 mg/dL 0.86  5.78  4.69   Sodium 135 - 145 mmol/L 145  142  141   Potassium 3.5 - 5.1 mmol/L 3.7  3.9  3.5   Chloride 98 - 111 mmol/L 112  111  110   CO2 22 - 32  mmol/L 28  26  25    Calcium 8.9 - 10.3 mg/dL 7.9  7.7  7.7   Total  Protein 6.5 - 8.1 g/dL 5.8  5.6  5.2   Total Bilirubin 0.3 - 1.2 mg/dL 0.4  0.3  0.3   Alkaline Phos 38 - 126 U/L 47  49  46   AST 15 - 41 U/L 11  11  12    ALT 0 - 44 U/L 7  8  10       RADIOGRAPHIC STUDIES: I have personally reviewed the radiological images as listed and agreed with the findings in the report. No results found.  ASSESSMENT & PLAN:   77 year old male with   #1 Progressive IgG kappa myeloma: Initially diagnosed in 2016 as stage II disease with standard risk cytogenetics today. 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 Status post treatment as noted above. Last seen by Dr. Lalla Brothers at [facility] in January 2024 and noted to have progressive disease with increase in his IgG kappa M spike at 2.87 g/dL.   Recent light chains done on 05/20/2023 at [facility] showed increase in free kappa light chains to 603 with a kappa lambda ratio of 25 this is up from kappa light chains of 402 with a kappa lambda ratio of 12.6 on 01/03/2023. CBC shows relatively stable hemoglobin of 11.3 with normal WBC count and platelets BMP shows stable chronic kidney disease with a creatinine of 1.87.  No overt hypercalcemia with a calcium of 9   #2 recent right clavicular pain with x-ray on 05/06/2023 showing mildly displaced likely pathologic fracture of the medial right clavicle.  Multiple other lytic lesions noted.   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  CT head done today 05/20/2023 shows  Large mass at the skull base, eroding and expanding the clivus, also involving the sphenoid bone and the petrous portions of both temporal bones. The mass broadly abuts both optic nerves between the chiasm and orbital apices. Mass also broadly involves both cavernous sinuses. 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 Recommend further  assessment with brain MRI without and with contrast.   MRI brain 05/20/2023 1. 7.8 x 5.6 x 3.3 cm enhancing mass lesion at the skull base extending along the petrous ridge bilaterally, greater on the right. 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 There are some intrinsic T2 signal scratched at there is some intrinsic T2 signal and chordoma still considered. However, given the multiple other enhancing lesions throughout the skull, this most likely represents metastatic disease. 2. No acute intracranial abnormality. 3. Mild atrophy and white matter changes are likely within normal limits for age. 4. Right mastoid effusion secondary to obstruction of the eustachian tube. 5. Minimal right maxillary sinus disease.   ADDENDUM: Lytic lesions have been identified previously with this patient. 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   #4 hyponatremia   #5 history of renal cell carcinoma right nephrectomy on 02/03/2016.  He received cryotherapy to his left renal mass in April 2017. Surgically resected by Dr. [name] at [facility] on July 07, 2021. 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 Tumor is limited to the kidney (pT1a).   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 CBC shows decreased hemoglobin at 10.2 g/dL and decreased hematocrit at 31.4%. CMP shows elevated creatinine at 1.69, decreased calcium level at 7.9, decreased total protein level at 5.8, and decreased AST at 11.  -myeloma panel from 7/23 shows M spike down to 0.3g/dl down from 1.0U/VO pre-treatment -Continue to hold Revlimid as of right now until ulcers are healed and the patient can safely be back on ASA -Recommend eating as much as possible.  -Zometa infusion is cleared  from dentist.  -Patient tolerated his cycle 1 of his treatment well without any new or severe toxicities. -Patient can proceed with treatment today without revlimid.  -Continue iron supplement, twice every week.   FOLLOW-UP: Continue daratumumab per integrated scheduling MD visit in 4 weeks  The total time spent in the appointment was 32 minutes* .  All of the patient's questions were answered with apparent satisfaction. The patient knows to call the clinic with any problems, questions or concerns.   Wyvonnia Lora MD MS AAHIVMS [facility] Hematology/Oncology Physician [facility]  .*Total Encounter Time as defined by the Centers for Medicare and Medicaid Services includes, in addition to the face-to-face time of a patient visit (documented in the note above) non-face-to-face time: obtaining and reviewing outside history, ordering and reviewing medications, tests or procedures, care coordination (communications with other health care professionals or caregivers) and documentation in the medical record.   I,Param Shah,acting as a Neurosurgeon for Wyvonnia Lora, MD.,have documented all relevant documentation on the behalf of Wyvonnia Lora, MD,as directed by  Wyvonnia Lora, MD while in the presence of Wyvonnia Lora, MD.   .I have reviewed the above documentation for accuracy and completeness, and I agree with the above. [name] MD

## 2023-08-01 NOTE — Patient Instructions (Signed)
[facility]  Discharge Instructions: Thank you for choosing [facility] to provide your oncology and hematology care.   If you have a lab appointment with the Cancer Center, please go directly to the Cancer Center and check in at the registration area.   Wear comfortable clothing and clothing appropriate for easy access to any Portacath or PICC line.   We strive to give you quality time with your provider. You may need to reschedule your appointment if you arrive late (15 or more minutes).  Arriving late affects you and other patients whose appointments are after yours.  Also, if you miss three or more appointments without notifying the office, you may be dismissed from the clinic at the provider's discretion.      For prescription refill requests, have your pharmacy contact our office and allow 72 hours for refills to be completed.    Today you received the following chemotherapy and/or immunotherapy agent: Daratumumab (Darzalex Faspro)   To help prevent nausea and vomiting after your treatment, we encourage you to take your nausea medication as directed.  BELOW ARE SYMPTOMS THAT SHOULD BE REPORTED IMMEDIATELY: *FEVER GREATER THAN 100.4 F (38 C) OR HIGHER *CHILLS OR SWEATING *NAUSEA AND VOMITING THAT IS NOT CONTROLLED WITH YOUR NAUSEA MEDICATION *UNUSUAL SHORTNESS OF BREATH *UNUSUAL BRUISING OR BLEEDING *URINARY PROBLEMS (pain or burning when urinating, or frequent urination) *BOWEL PROBLEMS (unusual diarrhea, constipation, pain near the anus) TENDERNESS IN MOUTH AND THROAT WITH OR WITHOUT PRESENCE OF ULCERS (sore throat, sores in mouth, or a toothache) UNUSUAL RASH, SWELLING OR PAIN  UNUSUAL VAGINAL DISCHARGE OR ITCHING   Items with * indicate a potential emergency and should be followed up as soon as possible or go to the Emergency Department if any problems should occur.  Please show the CHEMOTHERAPY ALERT CARD or IMMUNOTHERAPY  ALERT CARD at check-in to the Emergency Department and triage nurse.  Should you have questions after your visit or need to cancel or reschedule your appointment, please contact [facility]  Dept: [phone]  and follow the prompts.  Office hours are 8:00 a.m. to 4:30 p.m. Monday - Friday. Please note that voicemails left after 4:00 p.m. may not be returned until the following business day.  We are closed weekends and major holidays. You have access to a nurse at all times for urgent questions. Please call the main number to the clinic Dept: [phone] and follow the prompts.   For any non-urgent questions, you may also contact your provider using MyChart. We now offer e-Visits for anyone 74 and older to request care online for non-urgent symptoms. For details visit [email or web address].   Also download the MyChart app! Go to the app store, search "MyChart", open the app, select [facility], and log in with your MyChart username and password.  Daratumumab; Hyaluronidase Injection What is this medication? DARATUMUMAB; HYALURONIDASE (dar a toom ue mab; hye al ur ON i dase) treats multiple myeloma, a type of bone marrow cancer. Daratumumab works by blocking a protein that causes cancer cells to grow and multiply. This helps to slow or stop the spread of cancer cells. Hyaluronidase works by increasing the absorption of other medications in the body to help them work better. This medication may also be used treat amyloidosis, a condition that causes the buildup of a protein (amyloid) in your body. It works by reducing the buildup of this protein, which decreases symptoms. It is a  combination medication that contains a monoclonal antibody. This medicine may be used for other purposes; ask your health care provider or pharmacist if you have questions. COMMON BRAND NAME(S): DARZALEX FASPRO What should I tell my care team before I take this medication? They need to  know if you have any of these conditions: Heart disease Infection, such as chickenpox, cold sores, herpes, hepatitis B Lung or breathing disease An unusual or allergic reaction to daratumumab, hyaluronidase, other medications, foods, dyes, or preservatives Pregnant or trying to get pregnant Breast-feeding How should I use this medication? This medication is injected under the skin. It is given by your care team in a hospital or clinic setting. Talk to your care team about the use of this medication in children. Special care may be needed. Overdosage: If you think you have taken too much of this medicine contact a poison control center or emergency room at once. NOTE: This medicine is only for you. Do not share this medicine with others. What if I miss a dose? Keep appointments for follow-up doses. It is important not to miss your dose. Call your care team if you are unable to keep an appointment. What may interact with this medication? Interactions have not been studied. This list may not describe all possible interactions. Give your health care provider a list of all the medicines, herbs, non-prescription drugs, or dietary supplements you use. Also tell them if you smoke, drink alcohol, or use illegal drugs. Some items may interact with your medicine. What should I watch for while using this medication? Your condition will be monitored carefully while you are receiving this medication. This medication can cause serious allergic reactions. To reduce your risk, your care team may give you other medication to take before receiving this one. Be sure to follow the directions from your care team. This medication can affect the results of blood tests to match your blood type. These changes can last for up to 6 months after the final dose. Your care team will do blood tests to match your blood type before you start treatment. Tell all of your care team that you are being treated with this medication  before receiving a blood transfusion. This medication can affect the results of some tests used to determine treatment response; extra tests may be needed to evaluate response. Talk to your care team if you wish to become pregnant or think you are pregnant. This medication can cause serious birth defects if taken during pregnancy and for 3 months after the last dose. A reliable form of contraception is recommended while taking this medication and for 3 months after the last dose. Talk to your care team about effective forms of contraception. Do not breast-feed while taking this medication. What side effects may I notice from receiving this medication? Side effects that you should report to your care team as soon as possible: Allergic reactions--skin rash, itching, hives, swelling of the face, lips, tongue, or throat Heart rhythm changes--fast or irregular heartbeat, dizziness, feeling faint or lightheaded, chest pain, trouble breathing Infection--fever, chills, cough, sore throat, wounds that don't heal, pain or trouble when passing urine, general feeling of discomfort or being unwell Infusion reactions--chest pain, shortness of breath or trouble breathing, feeling faint or lightheaded Sudden eye pain or change in vision such as blurry vision, seeing halos around lights, vision loss Unusual bruising or bleeding Side effects that usually do not require medical attention (report to your care team if they continue or are bothersome): Constipation  Diarrhea Fatigue Nausea Pain, tingling, or numbness in the hands or feet Swelling of the ankles, hands, or feet This list may not describe all possible side effects. Call your doctor for medical advice about side effects. You may report side effects to FDA at 1-800-FDA-1088. Where should I keep my medication? This medication is given in a hospital or clinic. It will not be stored at home. NOTE: This sheet is a summary. It may not cover all possible  information. If you have questions about this medicine, talk to your doctor, pharmacist, or health care provider.  2024 Elsevier/Gold Standard (2022-04-12 00:00:00)

## 2023-08-04 ENCOUNTER — Other Ambulatory Visit: Payer: Self-pay

## 2023-08-04 DIAGNOSIS — Z7189 Other specified counseling: Secondary | ICD-10-CM

## 2023-08-04 DIAGNOSIS — C9002 Multiple myeloma in relapse: Secondary | ICD-10-CM

## 2023-08-05 ENCOUNTER — Other Ambulatory Visit: Payer: Self-pay

## 2023-08-07 ENCOUNTER — Ambulatory Visit
Admission: RE | Admit: 2023-08-07 | Discharge: 2023-08-07 | Disposition: A | Discharge status: Home / Self Care | Payer: Medicare HMO | Source: Ambulatory Visit | Attending: Radiation Oncology | Admitting: Radiation Oncology

## 2023-08-07 ENCOUNTER — Encounter: Payer: Self-pay | Admitting: Hematology

## 2023-08-07 NOTE — Progress Notes (Addendum)
  Radiation Oncology         [phone]) [phone] ________________________________  Name: Marcus Hatfield MRN: [id number]  Date of Service: 08/07/2023  DOB: 11-27-1946  Post Treatment Telephone Note  Diagnosis:  C90.00 Multiple myeloma not having achieved remission (as documented in provider EOT note)   The patient was available for call today.  The patient did note fatigue during radiation but has improved. The patient did not note skin changes in the field of radiation during therapy. The patient has noticed improvement in pain in the area(s) treated with radiation. The patient is not taking dexamethasone. The patient does not have symptoms of  weakness or loss of control of the extremities. The patient does not have symptoms of headache. The patient does not have symptoms of seizure or uncontrolled movement. The patient does not have symptoms of changes in vision. The patient does not have changes in speech. The patient does not have confusion.   The patient is scheduled for ongoing care with Dr. Candise Che in medical oncology. The patient was encouraged to call if he develops concerns or questions regarding radiation.   This concludes the interaction.  Ruel Favors, LPN

## 2023-08-08 ENCOUNTER — Inpatient Hospital Stay: Payer: Medicare HMO

## 2023-08-08 ENCOUNTER — Other Ambulatory Visit: Payer: Self-pay

## 2023-08-08 VITALS — BP 120/82 | HR 78 | Temp 99.6°F | Resp 16 | Wt 173.0 lb

## 2023-08-08 DIAGNOSIS — C9000 Multiple myeloma not having achieved remission: Secondary | ICD-10-CM | POA: Diagnosis not present

## 2023-08-08 DIAGNOSIS — Z7189 Other specified counseling: Secondary | ICD-10-CM

## 2023-08-08 DIAGNOSIS — Z5112 Encounter for antineoplastic immunotherapy: Secondary | ICD-10-CM | POA: Diagnosis not present

## 2023-08-08 DIAGNOSIS — C9002 Multiple myeloma in relapse: Secondary | ICD-10-CM

## 2023-08-08 LAB — COMPREHENSIVE METABOLIC PANEL
ALT: 5 U/L (ref 0–44)
AST: 9 U/L — ABNORMAL LOW (ref 15–41)
Albumin: 3.4 g/dL — ABNORMAL LOW (ref 3.5–5.0)
Alkaline Phosphatase: 47 U/L (ref 38–126)
Anion gap: 6 (ref 5–15)
BUN: 15 mg/dL (ref 8–23)
CO2: 26 mmol/L (ref 22–32)
Calcium: 8 mg/dL — ABNORMAL LOW (ref 8.9–10.3)
Chloride: 111 mmol/L (ref 98–111)
Creatinine, Ser: 1.42 mg/dL — ABNORMAL HIGH (ref 0.61–1.24)
GFR, Estimated: 51 mL/min — ABNORMAL LOW (ref >60)
Glucose, Bld: 99 mg/dL (ref 70–99)
Potassium: 3.7 mmol/L (ref 3.5–5.1)
Sodium: 143 mmol/L (ref 135–145)
Total Bilirubin: 0.3 mg/dL (ref 0.3–1.2)
Total Protein: 5.9 g/dL — ABNORMAL LOW (ref 6.5–8.1)

## 2023-08-08 LAB — CBC WITH DIFFERENTIAL (CANCER CENTER ONLY)
Abs Immature Granulocytes: 0.03 10*3/uL (ref 0.00–0.07)
Basophils Absolute: 0 10*3/uL (ref 0.0–0.1)
Basophils Relative: 1 %
Eosinophils Absolute: 0.1 10*3/uL (ref 0.0–0.5)
Eosinophils Relative: 2 %
HCT: 30.6 % — ABNORMAL LOW (ref 39.0–52.0)
Hemoglobin: 9.9 g/dL — ABNORMAL LOW (ref 13.0–17.0)
Immature Granulocytes: 1 %
Lymphocytes Relative: 35 %
Lymphs Abs: 1.9 10*3/uL (ref 0.7–4.0)
MCH: 31.9 pg (ref 26.0–34.0)
MCHC: 32.4 g/dL (ref 30.0–36.0)
MCV: 98.7 fL (ref 80.0–100.0)
Monocytes Absolute: 0.7 10*3/uL (ref 0.1–1.0)
Monocytes Relative: 12 %
Neutro Abs: 2.7 10*3/uL (ref 1.7–7.7)
Neutrophils Relative %: 49 %
Platelet Count: 199 10*3/uL (ref 150–400)
RBC: 3.1 MIL/uL — ABNORMAL LOW (ref 4.22–5.81)
RDW: 16.7 % — ABNORMAL HIGH (ref 11.5–15.5)
WBC Count: 5.4 10*3/uL (ref 4.0–10.5)
nRBC: 0 % (ref 0.0–0.2)

## 2023-08-08 MED ORDER — ACETAMINOPHEN 325 MG PO TABS
650.0000 mg | ORAL_TABLET | Freq: Once | ORAL | Status: AC
  Administered 2023-08-08: 650 mg via ORAL
  Filled 2023-08-08: qty 2

## 2023-08-08 MED ORDER — FAMOTIDINE 20 MG PO TABS
20.0000 mg | ORAL_TABLET | Freq: Once | ORAL | Status: AC
  Administered 2023-08-08: 20 mg via ORAL
  Filled 2023-08-08: qty 1

## 2023-08-08 MED ORDER — DEXAMETHASONE 4 MG PO TABS
12.0000 mg | ORAL_TABLET | Freq: Once | ORAL | Status: AC
  Administered 2023-08-08: 12 mg via ORAL
  Filled 2023-08-08: qty 3

## 2023-08-08 MED ORDER — DARATUMUMAB-HYALURONIDASE-FIHJ 1800-30000 MG-UT/15ML ~~LOC~~ SOLN
1800.0000 mg | Freq: Once | SUBCUTANEOUS | Status: AC
  Administered 2023-08-08: 1800 mg via SUBCUTANEOUS
  Filled 2023-08-08: qty 15

## 2023-08-08 MED ORDER — DIPHENHYDRAMINE HCL 25 MG PO CAPS
50.0000 mg | ORAL_CAPSULE | Freq: Once | ORAL | Status: AC
  Administered 2023-08-08: 50 mg via ORAL
  Filled 2023-08-08: qty 2

## 2023-08-08 NOTE — Patient Instructions (Signed)
[facility]  Discharge Instructions: Thank you for choosing [facility] to provide your oncology and hematology care.   If you have a lab appointment with the Cancer Center, please go directly to the Cancer Center and check in at the registration area.   Wear comfortable clothing and clothing appropriate for easy access to any Portacath or PICC line.   We strive to give you quality time with your provider. You may need to reschedule your appointment if you arrive late (15 or more minutes).  Arriving late affects you and other patients whose appointments are after yours.  Also, if you miss three or more appointments without notifying the office, you may be dismissed from the clinic at the provider's discretion.      For prescription refill requests, have your pharmacy contact our office and allow 72 hours for refills to be completed.    Today you received the following chemotherapy and/or immunotherapy agent: Daratumumab (Darzalex Faspro)   To help prevent nausea and vomiting after your treatment, we encourage you to take your nausea medication as directed.  BELOW ARE SYMPTOMS THAT SHOULD BE REPORTED IMMEDIATELY: *FEVER GREATER THAN 100.4 F (38 C) OR HIGHER *CHILLS OR SWEATING *NAUSEA AND VOMITING THAT IS NOT CONTROLLED WITH YOUR NAUSEA MEDICATION *UNUSUAL SHORTNESS OF BREATH *UNUSUAL BRUISING OR BLEEDING *URINARY PROBLEMS (pain or burning when urinating, or frequent urination) *BOWEL PROBLEMS (unusual diarrhea, constipation, pain near the anus) TENDERNESS IN MOUTH AND THROAT WITH OR WITHOUT PRESENCE OF ULCERS (sore throat, sores in mouth, or a toothache) UNUSUAL RASH, SWELLING OR PAIN  UNUSUAL VAGINAL DISCHARGE OR ITCHING   Items with * indicate a potential emergency and should be followed up as soon as possible or go to the Emergency Department if any problems should occur.  Please show the CHEMOTHERAPY ALERT CARD or IMMUNOTHERAPY  ALERT CARD at check-in to the Emergency Department and triage nurse.  Should you have questions after your visit or need to cancel or reschedule your appointment, please contact [facility]  Dept: [phone]  and follow the prompts.  Office hours are 8:00 a.m. to 4:30 p.m. Monday - Friday. Please note that voicemails left after 4:00 p.m. may not be returned until the following business day.  We are closed weekends and major holidays. You have access to a nurse at all times for urgent questions. Please call the main number to the clinic Dept: [phone] and follow the prompts.   For any non-urgent questions, you may also contact your provider using MyChart. We now offer e-Visits for anyone 74 and older to request care online for non-urgent symptoms. For details visit [email or web address].   Also download the MyChart app! Go to the app store, search "MyChart", open the app, select [facility], and log in with your MyChart username and password.  Daratumumab; Hyaluronidase Injection What is this medication? DARATUMUMAB; HYALURONIDASE (dar a toom ue mab; hye al ur ON i dase) treats multiple myeloma, a type of bone marrow cancer. Daratumumab works by blocking a protein that causes cancer cells to grow and multiply. This helps to slow or stop the spread of cancer cells. Hyaluronidase works by increasing the absorption of other medications in the body to help them work better. This medication may also be used treat amyloidosis, a condition that causes the buildup of a protein (amyloid) in your body. It works by reducing the buildup of this protein, which decreases symptoms. It is a  combination medication that contains a monoclonal antibody. This medicine may be used for other purposes; ask your health care provider or pharmacist if you have questions. COMMON BRAND NAME(S): DARZALEX FASPRO What should I tell my care team before I take this medication? They need to  know if you have any of these conditions: Heart disease Infection, such as chickenpox, cold sores, herpes, hepatitis B Lung or breathing disease An unusual or allergic reaction to daratumumab, hyaluronidase, other medications, foods, dyes, or preservatives Pregnant or trying to get pregnant Breast-feeding How should I use this medication? This medication is injected under the skin. It is given by your care team in a hospital or clinic setting. Talk to your care team about the use of this medication in children. Special care may be needed. Overdosage: If you think you have taken too much of this medicine contact a poison control center or emergency room at once. NOTE: This medicine is only for you. Do not share this medicine with others. What if I miss a dose? Keep appointments for follow-up doses. It is important not to miss your dose. Call your care team if you are unable to keep an appointment. What may interact with this medication? Interactions have not been studied. This list may not describe all possible interactions. Give your health care provider a list of all the medicines, herbs, non-prescription drugs, or dietary supplements you use. Also tell them if you smoke, drink alcohol, or use illegal drugs. Some items may interact with your medicine. What should I watch for while using this medication? Your condition will be monitored carefully while you are receiving this medication. This medication can cause serious allergic reactions. To reduce your risk, your care team may give you other medication to take before receiving this one. Be sure to follow the directions from your care team. This medication can affect the results of blood tests to match your blood type. These changes can last for up to 6 months after the final dose. Your care team will do blood tests to match your blood type before you start treatment. Tell all of your care team that you are being treated with this medication  before receiving a blood transfusion. This medication can affect the results of some tests used to determine treatment response; extra tests may be needed to evaluate response. Talk to your care team if you wish to become pregnant or think you are pregnant. This medication can cause serious birth defects if taken during pregnancy and for 3 months after the last dose. A reliable form of contraception is recommended while taking this medication and for 3 months after the last dose. Talk to your care team about effective forms of contraception. Do not breast-feed while taking this medication. What side effects may I notice from receiving this medication? Side effects that you should report to your care team as soon as possible: Allergic reactions--skin rash, itching, hives, swelling of the face, lips, tongue, or throat Heart rhythm changes--fast or irregular heartbeat, dizziness, feeling faint or lightheaded, chest pain, trouble breathing Infection--fever, chills, cough, sore throat, wounds that don't heal, pain or trouble when passing urine, general feeling of discomfort or being unwell Infusion reactions--chest pain, shortness of breath or trouble breathing, feeling faint or lightheaded Sudden eye pain or change in vision such as blurry vision, seeing halos around lights, vision loss Unusual bruising or bleeding Side effects that usually do not require medical attention (report to your care team if they continue or are bothersome): Constipation  Diarrhea Fatigue Nausea Pain, tingling, or numbness in the hands or feet Swelling of the ankles, hands, or feet This list may not describe all possible side effects. Call your doctor for medical advice about side effects. You may report side effects to FDA at 1-800-FDA-1088. Where should I keep my medication? This medication is given in a hospital or clinic. It will not be stored at home. NOTE: This sheet is a summary. It may not cover all possible  information. If you have questions about this medicine, talk to your doctor, pharmacist, or health care provider.  2024 Elsevier/Gold Standard (2022-04-12 00:00:00)

## 2023-08-09 LAB — KAPPA/LAMBDA LIGHT CHAINS
Kappa free light chain: 10.8 mg/L (ref 3.3–19.4)
Kappa, lambda light chain ratio: 0.72 (ref 0.26–1.65)
Lambda free light chains: 14.9 mg/L (ref 5.7–26.3)

## 2023-08-14 LAB — MULTIPLE MYELOMA PANEL, SERUM
Albumin SerPl Elph-Mcnc: 2.8 g/dL — ABNORMAL LOW (ref 2.9–4.4)
Albumin/Glob SerPl: 1.2 (ref 0.7–1.7)
Alpha 1: 0.2 g/dL (ref 0.0–0.4)
Alpha2 Glob SerPl Elph-Mcnc: 0.9 g/dL (ref 0.4–1.0)
B-Globulin SerPl Elph-Mcnc: 0.8 g/dL (ref 0.7–1.3)
Gamma Glob SerPl Elph-Mcnc: 0.6 g/dL (ref 0.4–1.8)
Globulin, Total: 2.5 g/dL (ref 2.2–3.9)
IgA: 37 mg/dL — ABNORMAL LOW (ref 61–437)
IgG (Immunoglobin G), Serum: 763 mg/dL (ref 603–1613)
IgM (Immunoglobulin M), Srm: 23 mg/dL (ref 15–143)
M Protein SerPl Elph-Mcnc: 0.4 g/dL — ABNORMAL HIGH
Total Protein ELP: 5.3 g/dL — ABNORMAL LOW (ref 6.0–8.5)

## 2023-08-22 ENCOUNTER — Inpatient Hospital Stay: Payer: Medicare HMO | Attending: Hematology

## 2023-08-22 ENCOUNTER — Inpatient Hospital Stay: Payer: Medicare HMO

## 2023-08-22 ENCOUNTER — Inpatient Hospital Stay (HOSPITAL_BASED_OUTPATIENT_CLINIC_OR_DEPARTMENT_OTHER): Payer: Medicare HMO | Admitting: Physician Assistant

## 2023-08-22 ENCOUNTER — Encounter: Payer: Self-pay | Admitting: Hematology

## 2023-08-22 ENCOUNTER — Other Ambulatory Visit: Payer: Self-pay | Admitting: Hematology

## 2023-08-22 VITALS — BP 122/83 | HR 70 | Resp 17

## 2023-08-22 DIAGNOSIS — I129 Hypertensive chronic kidney disease with stage 1 through stage 4 chronic kidney disease, or unspecified chronic kidney disease: Secondary | ICD-10-CM | POA: Insufficient documentation

## 2023-08-22 DIAGNOSIS — M549 Dorsalgia, unspecified: Secondary | ICD-10-CM | POA: Diagnosis not present

## 2023-08-22 DIAGNOSIS — Z5112 Encounter for antineoplastic immunotherapy: Secondary | ICD-10-CM | POA: Insufficient documentation

## 2023-08-22 DIAGNOSIS — C9002 Multiple myeloma in relapse: Secondary | ICD-10-CM

## 2023-08-22 DIAGNOSIS — Z7189 Other specified counseling: Secondary | ICD-10-CM

## 2023-08-22 DIAGNOSIS — C9000 Multiple myeloma not having achieved remission: Secondary | ICD-10-CM | POA: Diagnosis not present

## 2023-08-22 DIAGNOSIS — N183 Chronic kidney disease, stage 3 unspecified: Secondary | ICD-10-CM | POA: Insufficient documentation

## 2023-08-22 DIAGNOSIS — Z7961 Long term (current) use of immunomodulator: Secondary | ICD-10-CM | POA: Diagnosis not present

## 2023-08-22 DIAGNOSIS — Z79899 Other long term (current) drug therapy: Secondary | ICD-10-CM | POA: Insufficient documentation

## 2023-08-22 DIAGNOSIS — Z85528 Personal history of other malignant neoplasm of kidney: Secondary | ICD-10-CM | POA: Diagnosis not present

## 2023-08-22 LAB — CBC WITH DIFFERENTIAL (CANCER CENTER ONLY)
Abs Immature Granulocytes: 0.01 10*3/uL (ref 0.00–0.07)
Basophils Absolute: 0 10*3/uL (ref 0.0–0.1)
Basophils Relative: 1 %
Eosinophils Absolute: 0.1 10*3/uL (ref 0.0–0.5)
Eosinophils Relative: 1 %
HCT: 32 % — ABNORMAL LOW (ref 39.0–52.0)
Hemoglobin: 10 g/dL — ABNORMAL LOW (ref 13.0–17.0)
Immature Granulocytes: 0 %
Lymphocytes Relative: 27 %
Lymphs Abs: 1.6 10*3/uL (ref 0.7–4.0)
MCH: 30.3 pg (ref 26.0–34.0)
MCHC: 31.3 g/dL (ref 30.0–36.0)
MCV: 97 fL (ref 80.0–100.0)
Monocytes Absolute: 0.7 10*3/uL (ref 0.1–1.0)
Monocytes Relative: 11 %
Neutro Abs: 3.5 10*3/uL (ref 1.7–7.7)
Neutrophils Relative %: 60 %
Platelet Count: 222 10*3/uL (ref 150–400)
RBC: 3.3 MIL/uL — ABNORMAL LOW (ref 4.22–5.81)
RDW: 15.4 % (ref 11.5–15.5)
WBC Count: 5.8 10*3/uL (ref 4.0–10.5)
nRBC: 0 % (ref 0.0–0.2)

## 2023-08-22 LAB — COMPREHENSIVE METABOLIC PANEL
ALT: <5 U/L (ref 0–44)
AST: 9 U/L — ABNORMAL LOW (ref 15–41)
Albumin: 3.6 g/dL (ref 3.5–5.0)
Alkaline Phosphatase: 51 U/L (ref 38–126)
Anion gap: 5 (ref 5–15)
BUN: 14 mg/dL (ref 8–23)
CO2: 26 mmol/L (ref 22–32)
Calcium: 8.4 mg/dL — ABNORMAL LOW (ref 8.9–10.3)
Chloride: 111 mmol/L (ref 98–111)
Creatinine, Ser: 1.53 mg/dL — ABNORMAL HIGH (ref 0.61–1.24)
GFR, Estimated: 47 mL/min — ABNORMAL LOW (ref >60)
Glucose, Bld: 71 mg/dL (ref 70–99)
Potassium: 3.7 mmol/L (ref 3.5–5.1)
Sodium: 142 mmol/L (ref 135–145)
Total Bilirubin: 0.4 mg/dL (ref 0.3–1.2)
Total Protein: 6.1 g/dL — ABNORMAL LOW (ref 6.5–8.1)

## 2023-08-22 MED ORDER — DEXAMETHASONE 4 MG PO TABS
12.0000 mg | ORAL_TABLET | Freq: Once | ORAL | Status: AC
  Administered 2023-08-22: 12 mg via ORAL
  Filled 2023-08-22: qty 3

## 2023-08-22 MED ORDER — DARATUMUMAB-HYALURONIDASE-FIHJ 1800-30000 MG-UT/15ML ~~LOC~~ SOLN
1800.0000 mg | Freq: Once | SUBCUTANEOUS | Status: AC
  Administered 2023-08-22: 1800 mg via SUBCUTANEOUS
  Filled 2023-08-22: qty 15

## 2023-08-22 MED ORDER — FAMOTIDINE 20 MG PO TABS
20.0000 mg | ORAL_TABLET | Freq: Once | ORAL | Status: AC
  Administered 2023-08-22: 20 mg via ORAL
  Filled 2023-08-22: qty 1

## 2023-08-22 MED ORDER — ACETAMINOPHEN 325 MG PO TABS
650.0000 mg | ORAL_TABLET | Freq: Once | ORAL | Status: AC
  Administered 2023-08-22: 650 mg via ORAL
  Filled 2023-08-22: qty 2

## 2023-08-22 MED ORDER — DIPHENHYDRAMINE HCL 25 MG PO CAPS
50.0000 mg | ORAL_CAPSULE | Freq: Once | ORAL | Status: AC
  Administered 2023-08-22: 50 mg via ORAL
  Filled 2023-08-22: qty 2

## 2023-08-22 NOTE — Progress Notes (Signed)
HEMATOLOGY/ONCOLOGY CLINIC NOTE  Date of Service: 08/22/23   Patient Care Team: Chilton Greathouse, MD as PCP - General (Internal Medicine)  CHIEF COMPLAINTS/PURPOSE OF CONSULTATION:  IgG kappa mutliple myeloma with base of skull plasmacytoma  TREATMENT HISTORY 05/23/2016: Induction chemotherapy with bortezomib 1.3 mg/m2 weekly and dexamethasone 40 mg weekly using 28-day cycles. Plans to add lenalidomide and Zometa with cycle 2 (06/20/2016).  07/18/2016: Added lenalidomide 10 mg days 1-21 out of 28 days, delayed starting due to insurance. Started lenalidomide on 07/18/2016 with cycle 3. Achieved a PR after 4 cycles of treatment (2 cycles containing lenalidomide). 11/14/2016: Treatment changed to carfilzomib (D1/8/15) and dexamethasone to achieve a deeper response after having plateaued on prior regimen.  01/24/2017: Bone marrow biopsy showed no increased plasma cells but was positive for Congo red stain. Diagnosis of amyloid was made (AL pending confirmation with mass spect). 02/01/2017: Cyclophosphamide added to existing regimen on D1/8/15 to deepen response and due to amyloid deposits found on bone marrow biopsy. 06/19/2017: Transitioned to D1/15 dosing in an attempt to transition to maintenance. His M-spike was 0.2 g/dL, consistent with a PR but close to VGPR. 10/02/2017: Discontinued dexamethasone but continued on cyclophosphamide and carfilzomib after showing persistent stable disease with M-spike 0.29 g/dL. 10/2017: Marcus Hatfield held all therapy starting in 10/2017 since he believed it was not needed. A long discussion was had with him during visit in 04/2018 regarding concern for chemical relapse but he refused to get labs drawn and did not show interest in resuming some type of therapy.  07/16/2018 - 06/11/2019: He achieved an unconfirmed remission (VGPR) when re-assessed and maintained no detectable disease by serology since while on surveillance. 10/07/2019: Routine serology revealed  a new M-spike of 0.56 g/dL with IFIX IgG kappa. Patient maintained on surveillance with repeat labs planned for 11/06/2019. 11/06/2019: Confirmed chemical relapse with 2 consecutive positive m-spikes (0.56 and 0.47). A repeat serology from 12/16/19 showed an M-spike of 0.86 and restaging was was performed with bone marrow biopsy and PETCT scan in 12/2019, which showed 3% kappa restricted plasma cells and no evidence of new/active lesions. Patient remained on active surveillance per his request.  05/20/2023: Underwent CT head due to vision disturbance to the left eye x 3 weeks. Findings included large mass at the skull base, eroding and expanding the clivus,also involving the sphenoid bone and the petrous portions of both temporal bones. The mass broadly abuts both optic nerves between the chiasm and orbital apices. Mass also broadly involves both cavernous sinuses. 05/20/2023: MRI brain: 7.8 x 5.6 x 3.3 cm enhancing mass lesion at the skull base extending along the petrous ridge bilaterally, greater on the right. 05/21/2023: CT CAP: Numerous lytic osseous lesions, consistent with patient's history of multiple myeloma. A large lucent lesion of the T6 vertebral body involves the posterior cortex, extension into the spinal canal can not be excluded 05/22/2023-06/02/2023: Received palliative radiation to base of skull and medial right clavicle. 06/13/2023: Started Cycle 1, Day 1 of Daratumumab/Revlimid/Dex.  06/19/2023: Recommend to hold Revlimid as patient cannot take aspirin therapy due to recent GI bleed.   INTERVAL HISTORY: Marcus Hatfield is a 77 y.o. male who is here for continued evaluation and management of multiple myeloma with base of skull plasmacytoma. He was last seen by Dr. Candise Che on 08/01/2023.  Mr. [name] reports he is tolerating his treatment without any issues. He reports appetite is stable.  Nausea, vomiting or bowel habit changes.  No signs of active bleeding.  He reports mild  back pain starting  today without any known trigger. He denies fevers, chills, sweats, shortness of breath, chest pain or cough. He has no other complaints.   MEDICAL HISTORY:  Past Medical History:  Diagnosis Date   Anemia    Arthritis    CKD (chronic kidney disease) stage 3, GFR 30-59 ml/min (HCC) 20015   Creat 1.9   Hypertension    Multiple myeloma (HCC) 2016   [facility]   Prostate disorder 02/2017    SURGICAL HISTORY: Past Surgical History:  Procedure Laterality Date   BIOPSY  06/06/2023   Procedure: BIOPSY;  Surgeon: [name], MD;  Location: [facility] ENDOSCOPY;  Service: Gastroenterology;;   ESOPHAGOGASTRODUODENOSCOPY (EGD) WITH PROPOFOL N/A 06/06/2023   Procedure: ESOPHAGOGASTRODUODENOSCOPY (EGD) WITH PROPOFOL;  Surgeon: [name], MD;  Location: [facility] ENDOSCOPY;  Service: Gastroenterology;  Laterality: N/A;   EYE SURGERY Bilateral 01/30/2018   KNEE SURGERY     over ten years ago   NEPHRECTOMY Right 03/2015   ROBOTIC ASSITED PARTIAL NEPHRECTOMY Left 07/07/2021   Procedure: XI ROBOTIC ASSITED PARTIAL NEPHRECTOMY;  Surgeon: [name], MD;  Location: [facility] ORS;  Service: Urology;  Laterality: Left;  5 HRS   XI ROBOTIC ASSISTED SIMPLE PROSTATECTOMY N/A 07/07/2021   Procedure: XI ROBOTIC ASSISTED SIMPLE PROSTATECTOMY;  Surgeon: [name], MD;  Location: [facility] ORS;  Service: Urology;  Laterality: N/A;    SOCIAL HISTORY: Social History   Socioeconomic History   Marital status: Married    Spouse name: Museum/gallery curator   Number of children: 2   Years of education: Not on file   Highest education level: Not on file  Occupational History   Occupation: Retired  Tobacco Use   Smoking status: Never   Smokeless tobacco: Never  Vaping Use   Vaping status: Never Used  Substance and Sexual Activity   Alcohol use: No   Drug use: No   Sexual activity: Not on file  Other Topics Concern   Not on file  Social History Narrative   Not on file   Social Determinants of Health   Financial  Resource Strain: Not on file  Food Insecurity: Patient Declined (06/05/2023)   Hunger Vital Sign    Worried About Running Out of Food in the Last Year: Patient declined    Ran Out of Food in the Last Year: Patient declined  Transportation Needs: Patient Declined (06/05/2023)   PRAPARE - Administrator, Civil Service (Medical): Patient declined    Lack of Transportation (Non-Medical): Patient declined  Physical Activity: Not on file  Stress: Not on file  Social Connections: Not on file  Intimate Partner Violence: Patient Declined (06/05/2023)   Humiliation, Afraid, Rape, and Kick questionnaire    Fear of Current or Ex-Partner: Patient declined    Emotionally Abused: Patient declined    Physically Abused: Patient declined    Sexually Abused: Patient declined    FAMILY HISTORY: Family History  Problem Relation Age of Onset   Diabetes Father    Hypertension Brother     ALLERGIES:  is allergic to penicillins, latex, and tape.  MEDICATIONS:  Current Outpatient Medications  Medication Sig Dispense Refill   acyclovir (ZOVIRAX) 400 MG tablet Take 1 tablet (400 mg total) by mouth 2 (two) times daily. 60 tablet 11   amLODipine (NORVASC) 10 MG tablet Take 0.5 tablets (5 mg total) by mouth daily.     carvedilol (COREG) 6.25 MG tablet Take 1 tablet (6.25 mg total) by mouth 2 (two) times  daily with a meal.     diclofenac Sodium (VOLTAREN) 1 % GEL Apply 2 g topically daily as needed (for pain).     docusate sodium (COLACE) 100 MG capsule Take 1 capsule (100 mg total) by mouth daily as needed for mild constipation.     finasteride (PROSCAR) 5 MG tablet Take 5 mg by mouth daily.     fluticasone (FLONASE) 50 MCG/ACT nasal spray Place 2 sprays into both nostrils daily as needed for allergies.     furosemide (LASIX) 20 MG tablet Take 0.5 tablets (10 mg total) by mouth every other day. 30 tablet    lactulose (CHRONULAC) 10 GM/15ML solution Take 10 g by mouth daily as needed for  constipation.  5   lenalidomide (REVLIMID) 10 MG capsule Take 1 capsule (10 mg total) by mouth daily. Take for 21 days on, 7 days off, repeat every 28 days. 21 capsule 0   ondansetron (ZOFRAN) 8 MG tablet Take 1 tablet (8 mg total) by mouth every 8 (eight) hours as needed for nausea or vomiting. 30 tablet 1   oxyCODONE (OXY IR/ROXICODONE) 5 MG immediate release tablet Take 5 mg by mouth every 8 (eight) hours as needed for moderate pain, severe pain or breakthrough pain.     pantoprazole (PROTONIX) 40 MG tablet Take 1 tablet (40 mg total) by mouth 2 (two) times daily. Take bid x 10 weeks, followed by daily thereafter 90 tablet 3   prochlorperazine (COMPAZINE) 10 MG tablet Take 1 tablet (10 mg total) by mouth every 6 (six) hours as needed for nausea or vomiting. 30 tablet 1   tamsulosin (FLOMAX) 0.4 MG CAPS capsule Take 1 capsule (0.4 mg total) by mouth daily after supper. 30 capsule 1   ferrous sulfate 325 (65 FE) MG tablet Take 1 tablet (325 mg total) by mouth 2 (two) times daily as needed (iron).     No current facility-administered medications for this visit.    REVIEW OF SYSTEMS:    10 Point review of Systems was done is negative except as noted above.  PHYSICAL EXAMINATION: ECOG PERFORMANCE STATUS: 1 - Symptomatic but completely ambulatory VSS GENERAL:alert, in no acute distress and comfortable SKIN: no acute rashes, no significant lesions EYES: conjunctiva are pink and non-injected, sclera anicteric LUNGS: clear to auscultation b/l with normal respiratory effort HEART: regular rate & rhythm Extremity: no pedal edema PSYCH: alert & oriented x 3 with fluent speech NEURO: no focal motor/sensory deficits  LABORATORY DATA:  I have reviewed the data as listed .    Latest Ref Rng & Units 08/22/2023   10:38 AM 08/08/2023   12:52 PM 08/01/2023   10:41 AM  CBC  WBC 4.0 - 10.5 K/uL 5.8  5.4  6.1   Hemoglobin 13.0 - 17.0 g/dL 73.7  9.9  10.6   Hematocrit 39.0 - 52.0 % 32.0  30.6  31.4    Platelets 150 - 400 K/uL 222  199  200    .    Latest Ref Rng & Units 08/22/2023   10:38 AM 08/08/2023   12:52 PM 08/01/2023   10:41 AM  CMP  Glucose 70 - 99 mg/dL 71  99  80   BUN 8 - 23 mg/dL 14  15  14    Creatinine 0.61 - 1.24 mg/dL 2.69  4.85  4.62   Sodium 135 - 145 mmol/L 142  143  145   Potassium 3.5 - 5.1 mmol/L 3.7  3.7  3.7   Chloride 98 - 111  mmol/L 111  111  112   CO2 22 - 32 mmol/L 26  26  28    Calcium 8.9 - 10.3 mg/dL 8.4  8.0  7.9   Total Protein 6.5 - 8.1 g/dL 6.1  5.9  5.8   Total Bilirubin 0.3 - 1.2 mg/dL 0.4  0.3  0.4   Alkaline Phos 38 - 126 U/L 51  47  47   AST 15 - 41 U/L 9  9  11    ALT 0 - 44 U/L 5  5  7       RADIOGRAPHIC STUDIES: I have personally reviewed the radiological images as listed and agreed with the findings in the report. No results found.  ASSESSMENT & PLAN:  Marcus Hatfield is a 77 y.o. male who presents to the clinic for continued management for multiple myeloma.   #IgG kappa multiple myeloma:  -See oncologic history as above.  -Current treatment includes Daratumumab/Revlimid/Dexamethasone, started on 06/13/2023. -Revlimid on HOLD starting 06/19/2023 since patient is unable to take aspirin therapy due to recent GI bleeding.   #GI bleeding: --Admitted from 06/05/2023-06/06/2023.EGD showed nonbleeding duodenal ulcers. Received 2 units of PRBC. GI recommended PPI twice daily x 10 weeks and then daily thereafter. 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 --Vision has improved after completion of radiation therapy.  --Monitor for now.   #History of multifocal renal cell carcinoma --Underwent right nephrectomy on 02/03/2016.  He received cryotherapy to his left renal mass in April 2017. --Underwent laparoscopic left partial nephrectomy and  simple prostatectomy with Dr. Berneice Heinrich on 07/07/2021. 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 Tumor is limited to the kidney (pT1a).   PLAN: -Due for Cycle 3, Day 15 of Dara/Dex today -Labs from today were reviewed and adequate for treatment. WBC 5.8, Hgb 10.0, Plt 222, Creatinine is stable at 1.53. LFTs are in range.  -Most recent myeloma labs from 08/08/2023 showed of M protein measuring 0.4 g/dL. Kappa/Lambda light chains normal.  -Proceed with treatment today without any dose modifications. -Continue to hold Revlimid since patient is unable to take aspirin therapy for VTE risk due to history of GI bleed.  -Patient obtained dental clearance and Dr. Candise Che put orders in for Xgeva q 28 days today -Continue with acyclovir 400 mg BID for shingles prophylaxis.   FOLLOW-UP: Per integrated scheduling  All of the patient's questions were answered with apparent satisfaction. The patient knows to call the clinic with any problems, questions or concerns.  I have spent a total of 30 minutes minutes of face-to-face and non-face-to-face time, preparing to see the patient, performing a medically appropriate examination, counseling and educating the patient,documenting clinical information in the electronic health record, independently interpreting results and communicating results to the patient, and care coordination.   Georga Kaufmann PA-C Dept of Hematology and Oncology [facility] Phone: [phone]

## 2023-08-22 NOTE — Patient Instructions (Signed)

## 2023-08-31 ENCOUNTER — Ambulatory Visit (INDEPENDENT_AMBULATORY_CARE_PROVIDER_SITE_OTHER): Payer: Medicare HMO

## 2023-08-31 ENCOUNTER — Ambulatory Visit (HOSPITAL_COMMUNITY)
Admission: EM | Admit: 2023-08-31 | Discharge: 2023-08-31 | Disposition: A | Discharge status: Home / Self Care | Payer: Medicare HMO | Attending: Family Medicine | Admitting: Family Medicine

## 2023-08-31 ENCOUNTER — Encounter (HOSPITAL_COMMUNITY): Payer: Self-pay

## 2023-08-31 DIAGNOSIS — S2241XA Multiple fractures of ribs, right side, initial encounter for closed fracture: Secondary | ICD-10-CM | POA: Diagnosis not present

## 2023-08-31 DIAGNOSIS — S2231XA Fracture of one rib, right side, initial encounter for closed fracture: Secondary | ICD-10-CM | POA: Diagnosis not present

## 2023-08-31 MED ORDER — OXYCODONE-ACETAMINOPHEN 5-325 MG PO TABS
0.5000 | ORAL_TABLET | Freq: Four times a day (QID) | ORAL | 0 refills | Status: DC | PRN
Start: 2023-08-31 — End: 2023-12-08

## 2023-08-31 NOTE — ED Triage Notes (Signed)
Patient here today with c/o LB pain and abrasion to right posterior wrist after a fall this morning while taking the trash out. He was pushing the can and tripped while trying to go between the vehicles.

## 2023-08-31 NOTE — ED Provider Notes (Signed)
[facility]   [id number] 08/31/23 Arrival Time: 4098  ASSESSMENT & PLAN:  1. Closed fracture of multiple ribs of right side, initial encounter    R wrist abrasion bandaged by RN. Incentive spirometer provided. I have personally viewed the imaging studies ordered this visit. R ribs with PA chest: no pneumothorax; 6th and 7th rib fractures.  As needed: Meds ordered this encounter  Medications   oxyCODONE-acetaminophen (PERCOCET/ROXICET) 5-325 MG tablet    Sig: Take 0.5-1 tablets by mouth every 6 (six) hours as needed for severe pain.    Dispense:  20 tablet    Refill:  0  Discussed typical healing time for rib fractures.   Follow-up Information      Emergency Department at [facility].   Specialty: Emergency Medicine Why: If you feel short of breath at all. Contact information: [address] [place] [address] [phone]                Reviewed expectations re: course of current medical issues. Questions answered. Outlined signs and symptoms indicating need for more acute intervention. Patient verbalized understanding. After Visit Summary given.   SUBJECTIVE: History from: patient. Marcus Hatfield is a 77 y.o. male who presents with complaint of R rib pain and R wrist abrasion s/p fall this am. Denies SOB/n/v. Ambulatory here. Denies head injury. Wife is with him. Denies wrist pain. No tx PTA.  Social History   Tobacco Use  Smoking Status Never  Smokeless Tobacco Never   Social History   Substance and Sexual Activity  Alcohol Use No   OBJECTIVE:  Vitals:   08/31/23 0942  BP: 131/82  Pulse: 63  Resp: 16  Temp: 98.7 F (37.1 C)  TempSrc: Oral  SpO2: 95%  Weight: 78.9 kg  Height: 5\' 10"  (1.778 m)    General appearance: alert, oriented, no acute distress Eyes: PERRLA; EOMI; conjunctivae normal HENT: normocephalic; atraumatic Neck: supple with FROM Lungs: without labored respirations; speaks  full sentences without difficulty; CTAB Heart: regular Chest Wall: with tenderness to palpation over R posterior ribs; no overlying bruising Abdomen: soft, non-tender; no guarding or rebound tenderness Extremities: without edema; approx 1x2 cm abrasion of dorsal R wrist; R wrist with FROM; denies bony TTP Skin: warm and dry Neuro: normal gait Psychological: alert and cooperative; normal mood and affect  Imaging: DG Ribs Unilateral W/Chest Right  Result Date: 08/31/2023 CLINICAL DATA:  Fall, pain EXAM: RIGHT RIBS AND CHEST - 3+ VIEW COMPARISON:  06/05/2023 FINDINGS: Fracture of the posterior right sixth rib, adjacent to a more remote healed fracture. Additional fracture of the posterior right seventh rib. Unchanged contour of the healed right posterolateral fifth rib fracture. There is no evidence of pneumothorax or pleural effusion. Both lungs are clear. Heart size and mediastinal contours are within normal limits. IMPRESSION: Fracture of the posterior right sixth rib, adjacent to a more remote healed fracture. Additional fracture of the posterior right seventh rib. Electronically Signed   By: Wiliam Ke M.D.   On: 08/31/2023 11:35     Allergies  Allergen Reactions   Penicillins Hives    Has patient had a PCN reaction causing immediate rash, facial/tongue/throat swelling, SOB or lightheadedness with hypotension:Yes Has patient had a PCN reaction causing severe rash involving mucus membranes or skin necrosis: Yes Has patient had a PCN reaction that required hospitalization No Has patient had a PCN reaction occurring within the last 10 years: No If all of the above answers are "NO", then  may proceed with Cephalosporin use.    Latex Rash   Tape Rash    adhesive    Past Medical History:  Diagnosis Date   Anemia    Arthritis    CKD (chronic kidney disease) stage 3, GFR 30-59 ml/min (HCC) 20015   Creat 1.9   Hypertension    Multiple myeloma (HCC) 2016   [facility]   Prostate  disorder 02/2017   Social History   Socioeconomic History   Marital status: Married    Spouse name: Museum/gallery curator   Number of children: 2   Years of education: Not on file   Highest education level: Not on file  Occupational History   Occupation: Retired  Tobacco Use   Smoking status: Never   Smokeless tobacco: Never  Vaping Use   Vaping status: Never Used  Substance and Sexual Activity   Alcohol use: No   Drug use: No   Sexual activity: Not on file  Other Topics Concern   Not on file  Social History Narrative   Not on file   Social Determinants of Health   Financial Resource Strain: Not on file  Food Insecurity: Patient Declined (06/05/2023)   Hunger Vital Sign    Worried About Running Out of Food in the Last Year: Patient declined    Ran Out of Food in the Last Year: Patient declined  Transportation Needs: Patient Declined (06/05/2023)   PRAPARE - Administrator, Civil Service (Medical): Patient declined    Lack of Transportation (Non-Medical): Patient declined  Physical Activity: Not on file  Stress: Not on file  Social Connections: Not on file  Intimate Partner Violence: Patient Declined (06/05/2023)   Humiliation, Afraid, Rape, and Kick questionnaire    Fear of Current or Ex-Partner: Patient declined    Emotionally Abused: Patient declined    Physically Abused: Patient declined    Sexually Abused: Patient declined   Family History  Problem Relation Age of Onset   Diabetes Father    Hypertension Brother    Past Surgical History:  Procedure Laterality Date   BIOPSY  06/06/2023   Procedure: BIOPSY;  Surgeon: [name], MD;  Location: [facility] ENDOSCOPY;  Service: Gastroenterology;;   ESOPHAGOGASTRODUODENOSCOPY (EGD) WITH PROPOFOL N/A 06/06/2023   Procedure: ESOPHAGOGASTRODUODENOSCOPY (EGD) WITH PROPOFOL;  Surgeon: [name], MD;  Location: [facility] ENDOSCOPY;  Service: Gastroenterology;  Laterality: N/A;   EYE SURGERY Bilateral 01/30/2018   KNEE  SURGERY     over ten years ago   NEPHRECTOMY Right 03/2015   ROBOTIC ASSITED PARTIAL NEPHRECTOMY Left 07/07/2021   Procedure: XI ROBOTIC ASSITED PARTIAL NEPHRECTOMY;  Surgeon: [name], MD;  Location: [facility] ORS;  Service: Urology;  Laterality: Left;  5 HRS   XI ROBOTIC ASSISTED SIMPLE PROSTATECTOMY N/A 07/07/2021   Procedure: XI ROBOTIC ASSISTED SIMPLE PROSTATECTOMY;  Surgeon: [name], MD;  Location: [facility] ORS;  Service: Urology;  Laterality: N/A;      Mardella Layman, MD 08/31/23 1329

## 2023-09-05 ENCOUNTER — Other Ambulatory Visit: Payer: Self-pay | Admitting: Hematology

## 2023-09-05 ENCOUNTER — Inpatient Hospital Stay: Payer: Medicare HMO

## 2023-09-05 VITALS — BP 132/78 | HR 68 | Temp 98.2°F | Resp 18 | Wt 174.0 lb

## 2023-09-05 DIAGNOSIS — Z7189 Other specified counseling: Secondary | ICD-10-CM

## 2023-09-05 DIAGNOSIS — C9002 Multiple myeloma in relapse: Secondary | ICD-10-CM

## 2023-09-05 DIAGNOSIS — Z5112 Encounter for antineoplastic immunotherapy: Secondary | ICD-10-CM | POA: Diagnosis not present

## 2023-09-05 LAB — CBC WITH DIFFERENTIAL (CANCER CENTER ONLY)
Abs Immature Granulocytes: 0.01 10*3/uL (ref 0.00–0.07)
Basophils Absolute: 0 10*3/uL (ref 0.0–0.1)
Basophils Relative: 1 %
Eosinophils Absolute: 0.1 10*3/uL (ref 0.0–0.5)
Eosinophils Relative: 2 %
HCT: 33 % — ABNORMAL LOW (ref 39.0–52.0)
Hemoglobin: 10.5 g/dL — ABNORMAL LOW (ref 13.0–17.0)
Immature Granulocytes: 0 %
Lymphocytes Relative: 29 %
Lymphs Abs: 1.5 10*3/uL (ref 0.7–4.0)
MCH: 30.2 pg (ref 26.0–34.0)
MCHC: 31.8 g/dL (ref 30.0–36.0)
MCV: 94.8 fL (ref 80.0–100.0)
Monocytes Absolute: 0.7 10*3/uL (ref 0.1–1.0)
Monocytes Relative: 14 %
Neutro Abs: 2.9 10*3/uL (ref 1.7–7.7)
Neutrophils Relative %: 54 %
Platelet Count: 209 10*3/uL (ref 150–400)
RBC: 3.48 MIL/uL — ABNORMAL LOW (ref 4.22–5.81)
RDW: 14.8 % (ref 11.5–15.5)
WBC Count: 5.3 10*3/uL (ref 4.0–10.5)
nRBC: 0 % (ref 0.0–0.2)

## 2023-09-05 LAB — CMP (CANCER CENTER ONLY)
ALT: 9 U/L (ref 0–44)
AST: 9 U/L — ABNORMAL LOW (ref 15–41)
Albumin: 3.6 g/dL (ref 3.5–5.0)
Alkaline Phosphatase: 59 U/L (ref 38–126)
Anion gap: 6 (ref 5–15)
BUN: 13 mg/dL (ref 8–23)
CO2: 28 mmol/L (ref 22–32)
Calcium: 8.6 mg/dL — ABNORMAL LOW (ref 8.9–10.3)
Chloride: 107 mmol/L (ref 98–111)
Creatinine: 1.39 mg/dL — ABNORMAL HIGH (ref 0.61–1.24)
GFR, Estimated: 53 mL/min — ABNORMAL LOW (ref >60)
Glucose, Bld: 102 mg/dL — ABNORMAL HIGH (ref 70–99)
Potassium: 3.7 mmol/L (ref 3.5–5.1)
Sodium: 141 mmol/L (ref 135–145)
Total Bilirubin: 0.4 mg/dL (ref 0.3–1.2)
Total Protein: 6.2 g/dL — ABNORMAL LOW (ref 6.5–8.1)

## 2023-09-05 MED ORDER — FAMOTIDINE 20 MG PO TABS
20.0000 mg | ORAL_TABLET | Freq: Once | ORAL | Status: AC
  Administered 2023-09-05: 20 mg via ORAL
  Filled 2023-09-05: qty 1

## 2023-09-05 MED ORDER — DENOSUMAB 120 MG/1.7ML ~~LOC~~ SOLN
120.0000 mg | Freq: Once | SUBCUTANEOUS | Status: AC
  Administered 2023-09-05: 120 mg via SUBCUTANEOUS
  Filled 2023-09-05: qty 1.7

## 2023-09-05 MED ORDER — DEXAMETHASONE 4 MG PO TABS
12.0000 mg | ORAL_TABLET | Freq: Once | ORAL | Status: AC
  Administered 2023-09-05: 12 mg via ORAL
  Filled 2023-09-05: qty 3

## 2023-09-05 MED ORDER — ACETAMINOPHEN 325 MG PO TABS
650.0000 mg | ORAL_TABLET | Freq: Once | ORAL | Status: AC
  Administered 2023-09-05: 650 mg via ORAL
  Filled 2023-09-05: qty 2

## 2023-09-05 MED ORDER — DARATUMUMAB-HYALURONIDASE-FIHJ 1800-30000 MG-UT/15ML ~~LOC~~ SOLN
1800.0000 mg | Freq: Once | SUBCUTANEOUS | Status: AC
  Administered 2023-09-05: 1800 mg via SUBCUTANEOUS
  Filled 2023-09-05: qty 15

## 2023-09-05 MED ORDER — DIPHENHYDRAMINE HCL 25 MG PO CAPS
50.0000 mg | ORAL_CAPSULE | Freq: Once | ORAL | Status: AC
  Administered 2023-09-05: 50 mg via ORAL
  Filled 2023-09-05: qty 2

## 2023-09-05 NOTE — Patient Instructions (Signed)
[facility]  Discharge Instructions: Thank you for choosing [facility] to provide your oncology and hematology care.   If you have a lab appointment with the Cancer Center, please go directly to the Cancer Center and check in at the registration area.   Wear comfortable clothing and clothing appropriate for easy access to any Portacath or PICC line.   We strive to give you quality time with your provider. You may need to reschedule your appointment if you arrive late (15 or more minutes).  Arriving late affects you and other patients whose appointments are after yours.  Also, if you miss three or more appointments without notifying the office, you may be dismissed from the clinic at the provider's discretion.      For prescription refill requests, have your pharmacy contact our office and allow 72 hours for refills to be completed.    Today you received the following chemotherapy and/or immunotherapy agents Darzalex Faspro      To help prevent nausea and vomiting after your treatment, we encourage you to take your nausea medication as directed.  BELOW ARE SYMPTOMS THAT SHOULD BE REPORTED IMMEDIATELY: *FEVER GREATER THAN 100.4 F (38 C) OR HIGHER *CHILLS OR SWEATING *NAUSEA AND VOMITING THAT IS NOT CONTROLLED WITH YOUR NAUSEA MEDICATION *UNUSUAL SHORTNESS OF BREATH *UNUSUAL BRUISING OR BLEEDING *URINARY PROBLEMS (pain or burning when urinating, or frequent urination) *BOWEL PROBLEMS (unusual diarrhea, constipation, pain near the anus) TENDERNESS IN MOUTH AND THROAT WITH OR WITHOUT PRESENCE OF ULCERS (sore throat, sores in mouth, or a toothache) UNUSUAL RASH, SWELLING OR PAIN  UNUSUAL VAGINAL DISCHARGE OR ITCHING   Items with * indicate a potential emergency and should be followed up as soon as possible or go to the Emergency Department if any problems should occur.  Please show the CHEMOTHERAPY ALERT CARD or IMMUNOTHERAPY ALERT CARD at  check-in to the Emergency Department and triage nurse.  Should you have questions after your visit or need to cancel or reschedule your appointment, please contact [facility]  Dept: [phone]  and follow the prompts.  Office hours are 8:00 a.m. to 4:30 p.m. Monday - Friday. Please note that voicemails left after 4:00 p.m. may not be returned until the following business day.  We are closed weekends and major holidays. You have access to a nurse at all times for urgent questions. Please call the main number to the clinic Dept: [phone] and follow the prompts.   For any non-urgent questions, you may also contact your provider using MyChart. We now offer e-Visits for anyone 18 and older to request care online for non-urgent symptoms. For details visit [email or web address].   Also download the MyChart app! Go to the app store, search "MyChart", open the app, select [facility], and log in with your MyChart username and password.  

## 2023-09-19 ENCOUNTER — Inpatient Hospital Stay: Payer: Medicare HMO | Attending: Hematology

## 2023-09-19 ENCOUNTER — Inpatient Hospital Stay: Payer: Medicare HMO | Admitting: Hematology

## 2023-09-19 ENCOUNTER — Inpatient Hospital Stay: Payer: Medicare HMO

## 2023-09-19 VITALS — BP 124/76 | HR 73 | Temp 98.1°F | Resp 18 | Wt 174.2 lb

## 2023-09-19 DIAGNOSIS — G4700 Insomnia, unspecified: Secondary | ICD-10-CM | POA: Diagnosis not present

## 2023-09-19 DIAGNOSIS — J320 Chronic maxillary sinusitis: Secondary | ICD-10-CM | POA: Insufficient documentation

## 2023-09-19 DIAGNOSIS — Z7982 Long term (current) use of aspirin: Secondary | ICD-10-CM | POA: Insufficient documentation

## 2023-09-19 DIAGNOSIS — C9002 Multiple myeloma in relapse: Secondary | ICD-10-CM

## 2023-09-19 DIAGNOSIS — Z7189 Other specified counseling: Secondary | ICD-10-CM

## 2023-09-19 DIAGNOSIS — Z85528 Personal history of other malignant neoplasm of kidney: Secondary | ICD-10-CM | POA: Diagnosis not present

## 2023-09-19 DIAGNOSIS — E871 Hypo-osmolality and hyponatremia: Secondary | ICD-10-CM | POA: Insufficient documentation

## 2023-09-19 DIAGNOSIS — M545 Low back pain, unspecified: Secondary | ICD-10-CM | POA: Insufficient documentation

## 2023-09-19 DIAGNOSIS — Z79624 Long term (current) use of inhibitors of nucleotide synthesis: Secondary | ICD-10-CM | POA: Insufficient documentation

## 2023-09-19 DIAGNOSIS — Z7961 Long term (current) use of immunomodulator: Secondary | ICD-10-CM | POA: Diagnosis not present

## 2023-09-19 DIAGNOSIS — C9000 Multiple myeloma not having achieved remission: Secondary | ICD-10-CM | POA: Diagnosis not present

## 2023-09-19 DIAGNOSIS — D649 Anemia, unspecified: Secondary | ICD-10-CM | POA: Insufficient documentation

## 2023-09-19 DIAGNOSIS — Z79899 Other long term (current) drug therapy: Secondary | ICD-10-CM | POA: Insufficient documentation

## 2023-09-19 DIAGNOSIS — I129 Hypertensive chronic kidney disease with stage 1 through stage 4 chronic kidney disease, or unspecified chronic kidney disease: Secondary | ICD-10-CM | POA: Insufficient documentation

## 2023-09-19 DIAGNOSIS — R918 Other nonspecific abnormal finding of lung field: Secondary | ICD-10-CM | POA: Diagnosis not present

## 2023-09-19 DIAGNOSIS — M7989 Other specified soft tissue disorders: Secondary | ICD-10-CM | POA: Insufficient documentation

## 2023-09-19 DIAGNOSIS — R768 Other specified abnormal immunological findings in serum: Secondary | ICD-10-CM | POA: Insufficient documentation

## 2023-09-19 DIAGNOSIS — N183 Chronic kidney disease, stage 3 unspecified: Secondary | ICD-10-CM | POA: Diagnosis not present

## 2023-09-19 DIAGNOSIS — M129 Arthropathy, unspecified: Secondary | ICD-10-CM | POA: Insufficient documentation

## 2023-09-19 DIAGNOSIS — Z5112 Encounter for antineoplastic immunotherapy: Secondary | ICD-10-CM | POA: Diagnosis present

## 2023-09-19 DIAGNOSIS — R1032 Left lower quadrant pain: Secondary | ICD-10-CM | POA: Insufficient documentation

## 2023-09-19 LAB — CBC WITH DIFFERENTIAL (CANCER CENTER ONLY)
Abs Immature Granulocytes: 0.01 10*3/uL (ref 0.00–0.07)
Basophils Absolute: 0 10*3/uL (ref 0.0–0.1)
Basophils Relative: 1 %
Eosinophils Absolute: 0.3 10*3/uL (ref 0.0–0.5)
Eosinophils Relative: 5 %
HCT: 33 % — ABNORMAL LOW (ref 39.0–52.0)
Hemoglobin: 10.6 g/dL — ABNORMAL LOW (ref 13.0–17.0)
Immature Granulocytes: 0 %
Lymphocytes Relative: 28 %
Lymphs Abs: 1.7 10*3/uL (ref 0.7–4.0)
MCH: 29.8 pg (ref 26.0–34.0)
MCHC: 32.1 g/dL (ref 30.0–36.0)
MCV: 92.7 fL (ref 80.0–100.0)
Monocytes Absolute: 0.8 10*3/uL (ref 0.1–1.0)
Monocytes Relative: 13 %
Neutro Abs: 3.2 10*3/uL (ref 1.7–7.7)
Neutrophils Relative %: 53 %
Platelet Count: 228 10*3/uL (ref 150–400)
RBC: 3.56 MIL/uL — ABNORMAL LOW (ref 4.22–5.81)
RDW: 14.7 % (ref 11.5–15.5)
WBC Count: 6 10*3/uL (ref 4.0–10.5)
nRBC: 0 % (ref 0.0–0.2)

## 2023-09-19 LAB — COMPREHENSIVE METABOLIC PANEL
ALT: 5 U/L (ref 0–44)
AST: 10 U/L — ABNORMAL LOW (ref 15–41)
Albumin: 3.7 g/dL (ref 3.5–5.0)
Alkaline Phosphatase: 93 U/L (ref 38–126)
Anion gap: 6 (ref 5–15)
BUN: 13 mg/dL (ref 8–23)
CO2: 27 mmol/L (ref 22–32)
Calcium: 8.4 mg/dL — ABNORMAL LOW (ref 8.9–10.3)
Chloride: 109 mmol/L (ref 98–111)
Creatinine, Ser: 1.36 mg/dL — ABNORMAL HIGH (ref 0.61–1.24)
GFR, Estimated: 54 mL/min — ABNORMAL LOW (ref >60)
Glucose, Bld: 69 mg/dL — ABNORMAL LOW (ref 70–99)
Potassium: 3.7 mmol/L (ref 3.5–5.1)
Sodium: 142 mmol/L (ref 135–145)
Total Bilirubin: 0.4 mg/dL (ref 0.3–1.2)
Total Protein: 6.1 g/dL — ABNORMAL LOW (ref 6.5–8.1)

## 2023-09-19 MED ORDER — ACETAMINOPHEN 325 MG PO TABS
650.0000 mg | ORAL_TABLET | Freq: Once | ORAL | Status: AC
  Administered 2023-09-19: 650 mg via ORAL
  Filled 2023-09-19: qty 2

## 2023-09-19 MED ORDER — DARATUMUMAB-HYALURONIDASE-FIHJ 1800-30000 MG-UT/15ML ~~LOC~~ SOLN
1800.0000 mg | Freq: Once | SUBCUTANEOUS | Status: AC
  Administered 2023-09-19: 1800 mg via SUBCUTANEOUS
  Filled 2023-09-19: qty 15

## 2023-09-19 MED ORDER — DEXAMETHASONE 4 MG PO TABS
12.0000 mg | ORAL_TABLET | Freq: Once | ORAL | Status: AC
  Administered 2023-09-19: 12 mg via ORAL
  Filled 2023-09-19: qty 3

## 2023-09-19 MED ORDER — FAMOTIDINE 20 MG PO TABS
20.0000 mg | ORAL_TABLET | Freq: Once | ORAL | Status: AC
  Administered 2023-09-19: 20 mg via ORAL
  Filled 2023-09-19: qty 1

## 2023-09-19 MED ORDER — DIPHENHYDRAMINE HCL 25 MG PO CAPS
50.0000 mg | ORAL_CAPSULE | Freq: Once | ORAL | Status: AC
  Administered 2023-09-19: 50 mg via ORAL
  Filled 2023-09-19: qty 2

## 2023-09-19 NOTE — Progress Notes (Signed)
.  Patient seen by Dr. Kale  Vitals are within treatment parameters.  Labs reviewed: and are within treatment parameters. CMP still pending  Per physician team, patient is ready for treatment and there are NO modifications to the treatment plan.  

## 2023-09-19 NOTE — Patient Instructions (Signed)
[facility]  Discharge Instructions: Thank you for choosing [facility] to provide your oncology and hematology care.   If you have a lab appointment with the Cancer Center, please go directly to the Cancer Center and check in at the registration area.   Wear comfortable clothing and clothing appropriate for easy access to any Portacath or PICC line.   We strive to give you quality time with your provider. You may need to reschedule your appointment if you arrive late (15 or more minutes).  Arriving late affects you and other patients whose appointments are after yours.  Also, if you miss three or more appointments without notifying the office, you may be dismissed from the clinic at the provider's discretion.      For prescription refill requests, have your pharmacy contact our office and allow 72 hours for refills to be completed.    Today you received the following chemotherapy and/or immunotherapy agents: daratumumab-hyaluronidase-fihj      To help prevent nausea and vomiting after your treatment, we encourage you to take your nausea medication as directed.  BELOW ARE SYMPTOMS THAT SHOULD BE REPORTED IMMEDIATELY: *FEVER GREATER THAN 100.4 F (38 C) OR HIGHER *CHILLS OR SWEATING *NAUSEA AND VOMITING THAT IS NOT CONTROLLED WITH YOUR NAUSEA MEDICATION *UNUSUAL SHORTNESS OF BREATH *UNUSUAL BRUISING OR BLEEDING *URINARY PROBLEMS (pain or burning when urinating, or frequent urination) *BOWEL PROBLEMS (unusual diarrhea, constipation, pain near the anus) TENDERNESS IN MOUTH AND THROAT WITH OR WITHOUT PRESENCE OF ULCERS (sore throat, sores in mouth, or a toothache) UNUSUAL RASH, SWELLING OR PAIN  UNUSUAL VAGINAL DISCHARGE OR ITCHING   Items with * indicate a potential emergency and should be followed up as soon as possible or go to the Emergency Department if any problems should occur.  Please show the CHEMOTHERAPY ALERT CARD or  IMMUNOTHERAPY ALERT CARD at check-in to the Emergency Department and triage nurse.  Should you have questions after your visit or need to cancel or reschedule your appointment, please contact [facility]  Dept: [phone]  and follow the prompts.  Office hours are 8:00 a.m. to 4:30 p.m. Monday - Friday. Please note that voicemails left after 4:00 p.m. may not be returned until the following business day.  We are closed weekends and major holidays. You have access to a nurse at all times for urgent questions. Please call the main number to the clinic Dept: [phone] and follow the prompts.   For any non-urgent questions, you may also contact your provider using MyChart. We now offer e-Visits for anyone 18 and older to request care online for non-urgent symptoms. For details visit [email or web address].   Also download the MyChart app! Go to the app store, search "MyChart", open the app, select [facility], and log in with your MyChart username and password.   

## 2023-09-19 NOTE — Progress Notes (Signed)
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  Patient was seen by me as an inpatient on 05/20/2023.  He noted that his vision may be slightly better with the steroid. Had some mild insomnia but it was not too bothersome. Has been seen by radiation oncology and is going to be set up for CT simulation and to start radiation 05/21/2023.  Did have a CT chest abdomen pelvis without contrast to evaluate for any other soft tissue metastatic disease and whole-body skeletal survey to determine other overt progression and to rule out the possibility of further source of metastatic disease.  Today, he is accompanied by two family members. He reports that he is feeling well overall. He reports that his vision has improved and returned to baseline. He denies any glares in his vision which were previously present. He is able to count fingers and notes improved sight of colors on television.   His p.o. intake is normal and he notes drinking 4 16oz bottles daily. He denies any fevers, chills, night sweats, new back pain, fatigue, posterior neck pain, or abdominal pain.  He reports some nausea with previously taking Revlimid. Patient reports that he has an upcoming dental appointment.  INTERVAL HISTORY:  Marcus Hatfield is a wonderful 77 y.o. 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 He is here to start cycle 4 day 15 of his treatment.   Patient was last seen by PA Thayil on 08/22/2023 and he complained of mild back pain, but was doing well overall.   Patient is accompanied by  his wife during this visit. He notes he has been doing well overall since our last visit. He complains of occasional left lower abdominal pain and left lower back pain.   He is regularly taking Protonix and iron supplement.    He denies any new infection issues, fever, chills, night sweats, SOB, vision changes, chest pain, unexpected weight loss, or leg swelling. Patient notes his leg swelling has improved significantly since our last visit.   Patient notes he has been eating well overall, has been staying well hydrated, and has been exercising.   Patient notes his Gastroenterologist is not considering repeat endoscopy.   MEDICAL HISTORY:  Past Medical History:  Diagnosis Date   Anemia    Arthritis    CKD (chronic kidney disease) stage 3, GFR 30-59 ml/min (HCC) 20015   Creat 1.9   Hypertension    Multiple myeloma (HCC) 2016   [facility]   Prostate disorder 02/2017    SURGICAL HISTORY: Past Surgical History:  Procedure Laterality Date   BIOPSY  06/06/2023   Procedure: BIOPSY;  Surgeon: [name], MD;  Location: [facility] ENDOSCOPY;  Service: Gastroenterology;;   ESOPHAGOGASTRODUODENOSCOPY (EGD) WITH PROPOFOL N/A 06/06/2023   Procedure: ESOPHAGOGASTRODUODENOSCOPY (EGD) WITH PROPOFOL;  Surgeon: [name], MD;  Location: [facility] ENDOSCOPY;  Service: Gastroenterology;  Laterality: N/A;   EYE SURGERY Bilateral 01/30/2018   KNEE SURGERY     over ten years ago   NEPHRECTOMY Right 03/2015   ROBOTIC ASSITED PARTIAL NEPHRECTOMY Left 07/07/2021   Procedure: XI ROBOTIC ASSITED PARTIAL NEPHRECTOMY;  Surgeon: [name], MD;  Location: [facility] ORS;  Service: Urology;  Laterality: Left;  5 HRS   XI ROBOTIC ASSISTED SIMPLE PROSTATECTOMY N/A 07/07/2021   Procedure: XI ROBOTIC ASSISTED SIMPLE PROSTATECTOMY;  Surgeon: [name], MD;  Location: [facility] ORS;  Service: Urology;  Laterality: N/A;    SOCIAL HISTORY: Social History   Socioeconomic History   Marital status: Married    Spouse name:  Museum/gallery curator   Number of children: 2   Years of education: Not on file   Highest education level: Not on file  Occupational History   Occupation: Retired  Tobacco Use   Smoking status: Never   Smokeless tobacco: Never  Vaping Use   Vaping status: Never Used  Substance and Sexual Activity   Alcohol use: No   Drug use: No   Sexual activity: Not on file  Other Topics Concern   Not on file  Social History Narrative   Not on file   Social Determinants of Health   Financial Resource Strain: Not on file  Food Insecurity: Patient Declined (06/05/2023)   Hunger Vital Sign    Worried About Running Out of Food in the Last Year: Patient declined    Ran Out of Food in the Last Year: Patient declined  Transportation Needs: Patient Declined (06/05/2023)   PRAPARE - Administrator, Civil Service (Medical): Patient declined    Lack of Transportation (Non-Medical): Patient declined  Physical Activity: Not on file  Stress: Not on file  Social Connections: Not on file  Intimate Partner Violence: Patient Declined (06/05/2023)   Humiliation, Afraid, Rape, and Kick questionnaire    Fear of Current or Ex-Partner: Patient declined    Emotionally Abused: Patient declined    Physically Abused: Patient declined    Sexually Abused: Patient declined    FAMILY HISTORY: Family History  Problem Relation Age of Onset   Diabetes Father    Hypertension Brother     ALLERGIES:  is allergic to penicillins, latex, and tape.  MEDICATIONS:  Current Outpatient Medications  Medication Sig Dispense Refill   acyclovir (ZOVIRAX) 400 MG tablet Take 1 tablet (400 mg total) by mouth 2 (two) times daily. 60 tablet 11   amLODipine (NORVASC) 10 MG tablet Take 0.5 tablets (5 mg total) by mouth daily.     aspirin 81 MG chewable tablet Chew 81 mg by mouth daily. (Patient not taking: Reported on 08/31/2023)     carvedilol (COREG) 6.25 MG tablet Take 1 tablet (6.25 mg total) by mouth 2 (two) times daily  with a meal.     diclofenac Sodium (VOLTAREN) 1 % GEL Apply 2 g topically daily as needed (for pain).     docusate sodium (COLACE) 100 MG capsule Take 1 capsule (100 mg total) by mouth daily as needed for mild constipation.     ferrous sulfate 325 (65 FE) MG tablet Take 1 tablet (325 mg total) by mouth 2 (two) times daily as needed (iron).     finasteride (PROSCAR) 5 MG tablet Take 5 mg by mouth daily.     fluticasone (FLONASE) 50 MCG/ACT nasal spray Place 2 sprays into both nostrils daily as needed for allergies.     furosemide (LASIX) 20 MG tablet Take 0.5 tablets (10 mg total) by mouth every other day. 30 tablet    lactulose (CHRONULAC) 10 GM/15ML solution Take 10 g by mouth daily as needed for constipation.  5   lenalidomide (REVLIMID) 10 MG capsule Take 1 capsule (10 mg total) by  mouth daily. Take for 21 days on, 7 days off, repeat every 28 days. 21 capsule 0   ondansetron (ZOFRAN) 8 MG tablet Take 1 tablet (8 mg total) by mouth every 8 (eight) hours as needed for nausea or vomiting. 30 tablet 1   oxyCODONE-acetaminophen (PERCOCET/ROXICET) 5-325 MG tablet Take 0.5-1 tablets by mouth every 6 (six) hours as needed for severe pain. 20 tablet 0   pantoprazole (PROTONIX) 40 MG tablet Take 1 tablet (40 mg total) by mouth 2 (two) times daily. Take bid x 10 weeks, followed by daily thereafter 90 tablet 3   prochlorperazine (COMPAZINE) 10 MG tablet Take 1 tablet (10 mg total) by mouth every 6 (six) hours as needed for nausea or vomiting. 30 tablet 1   tamsulosin (FLOMAX) 0.4 MG CAPS capsule Take 1 capsule (0.4 mg total) by mouth daily after supper. 30 capsule 1   telmisartan (MICARDIS) 80 MG tablet Take 80 mg by mouth daily.     No current facility-administered medications for this visit.    REVIEW OF SYSTEMS:    10 Point review of Systems was done is negative except as noted above.  PHYSICAL EXAMINATION: ECOG PERFORMANCE STATUS: 2 - Symptomatic, <50% confined to bed .BP 124/76   Pulse 73    Temp 98.1 F (36.7 C)   Resp 18   Wt 174 lb 3.2 oz (79 kg)   SpO2 99%   BMI 25.00 kg/m   GENERAL:alert, in no acute distress and comfortable SKIN: no acute rashes, no significant lesions EYES: conjunctiva are pink and non-injected, sclera anicteric OROPHARYNX: MMM, no exudates, no oropharyngeal erythema or ulceration NECK: supple, no JVD LYMPH:  no palpable lymphadenopathy in the cervical, axillary or inguinal regions LUNGS: clear to auscultation b/l with normal respiratory effort HEART: regular rate & rhythm ABDOMEN:  normoactive bowel sounds , non tender, not distended. Extremity: no pedal edema PSYCH: alert & oriented x 3 with fluent speech NEURO: no focal motor/sensory deficits  LABORATORY DATA:  I have reviewed the data as listed .    Latest Ref Rng & Units 09/19/2023    9:51 AM 09/05/2023   12:49 PM 08/22/2023   10:38 AM  CBC  WBC 4.0 - 10.5 K/uL 6.0  5.3  5.8   Hemoglobin 13.0 - 17.0 g/dL 30.8  65.7  84.6   Hematocrit 39.0 - 52.0 % 33.0  33.0  32.0   Platelets 150 - 400 K/uL 228  209  222    .    Latest Ref Rng & Units 09/19/2023    9:51 AM 09/05/2023   12:49 PM 08/22/2023   10:38 AM  CMP  Glucose 70 - 99 mg/dL 69  962  71   BUN 8 - 23 mg/dL 13  13  14    Creatinine 0.61 - 1.24 mg/dL 9.52  8.41  3.24   Sodium 135 - 145 mmol/L 142  141  142   Potassium 3.5 - 5.1 mmol/L 3.7  3.7  3.7   Chloride 98 - 111 mmol/L 109  107  111   CO2 22 - 32 mmol/L 27  28  26    Calcium 8.9 - 10.3 mg/dL 8.4  8.6  8.4   Total Protein 6.5 - 8.1 g/dL 6.1  6.2  6.1   Total Bilirubin 0.3 - 1.2 mg/dL 0.4  0.4  0.4   Alkaline Phos 38 - 126 U/L 93  59  51   AST 15 - 41 U/L 10  9  9    ALT  0 - 44 U/L 5  9  <5      RADIOGRAPHIC STUDIES: I have personally reviewed the radiological images as listed and agreed with the findings in the report. DG Ribs Unilateral W/Chest Right  Result Date: 08/31/2023 CLINICAL DATA:  Fall, pain EXAM: RIGHT RIBS AND CHEST - 3+ VIEW COMPARISON:  06/05/2023  FINDINGS: Fracture of the posterior right sixth rib, adjacent to a more remote healed fracture. Additional fracture of the posterior right seventh rib. Unchanged contour of the healed right posterolateral fifth rib fracture. There is no evidence of pneumothorax or pleural effusion. Both lungs are clear. Heart size and mediastinal contours are within normal limits. IMPRESSION: Fracture of the posterior right sixth rib, adjacent to a more remote healed fracture. Additional fracture of the posterior right seventh rib. Electronically Signed   By: Wiliam Ke M.D.   On: 08/31/2023 11:35    ASSESSMENT & PLAN:   77 year old male with   #1 Progressive IgG kappa myeloma: Initially diagnosed in 2016 as stage II disease with standard risk cytogenetics today. 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 Status post treatment as noted above. Last seen by Dr. Lalla Brothers at [facility] in January 2024 and noted to have progressive disease with increase in his IgG kappa M spike at 2.87 g/dL.   Recent light chains done on 05/20/2023 at [facility] showed increase in free kappa light chains to 603 with a kappa lambda ratio of 25 this is up from kappa light chains of 402 with a kappa lambda ratio of 12.6 on 01/03/2023. CBC shows relatively stable hemoglobin of 11.3 with normal WBC count and platelets BMP shows stable chronic kidney disease with a creatinine of 1.87.  No overt hypercalcemia with a calcium of 9   #2 recent right clavicular pain with x-ray on 05/06/2023 showing mildly displaced likely pathologic fracture of the medial right clavicle.  Multiple other lytic lesions noted.   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  CT head done today 05/20/2023 shows  Large mass at the skull base, eroding and expanding the clivus, also involving the sphenoid bone and the petrous portions of both temporal bones. The mass broadly  abuts both optic nerves between the chiasm and orbital apices. Mass also broadly involves both cavernous sinuses. 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 Recommend further assessment with brain MRI without and with contrast.   MRI brain 05/20/2023 1. 7.8 x 5.6 x 3.3 cm enhancing mass lesion at the skull base extending along the petrous ridge bilaterally, greater on the right. 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 There are some intrinsic T2 signal scratched at there is some intrinsic T2 signal and chordoma still considered. However, given the multiple other enhancing lesions throughout the skull, this most likely represents metastatic disease. 2. No acute intracranial abnormality. 3. Mild atrophy and white matter changes are likely within normal limits for age. 4. Right mastoid effusion secondary to obstruction of the eustachian tube. 5. Minimal right maxillary sinus disease.   ADDENDUM: Lytic lesions have been identified previously with this patient. 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   #4 hyponatremia   #5 history of renal cell carcinoma right nephrectomy on 02/03/2016.  He received cryotherapy to his left renal mass in April 2017. Surgically resected by Dr.  [name] at [facility] on July 07, 2021. 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 Tumor is limited to the kidney (pT1a).   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 CBC shows slightly decreased hemoglobin at 10.6 g/dL and slightly decreased hematocrit at 33.0%. CMP is pending.   -Discussed Multiple myeloma panel results from 08/08/2023 in detail with the patient. Showed  elevated M-protein at 0.4.  -Discussed with the patient that he needs to be back on Revlimid. Patient agrees.  -Restart Revlimid from today. 3 week on 1 week off. Start 81 mg baby aspirin as well.  -Discussed that the goal of treatment.  -Recommend influenza vaccine, COVID-19 Booster, RSV vaccine, and other age related vaccines.  -Continue Protonix and iron supplement.   -Recommend eating as much as possible.  -Patient has been tolerating his treatment well without any new or severe toxicities. -Patient can proceed with treatment today.  -Continue iron supplement, twice every week.    FOLLOW-UP: Continue daratumumab per integrated scheduling MD visit in 4 weeks  The total time spent in the appointment was 30 minutes* .  All of the patient's questions were answered with apparent satisfaction. The patient knows to call the clinic with any problems, questions or concerns.   Wyvonnia Lora MD MS AAHIVMS [facility] Hematology/Oncology Physician [facility]  .*Total Encounter Time as defined by the Centers for Medicare and Medicaid Services includes, in addition to the face-to-face time of a patient visit (documented in the note above) non-face-to-face time: obtaining and reviewing outside history, ordering and reviewing medications, tests or procedures, care coordination (communications with other health care professionals or caregivers) and documentation in the medical record.   I,Param Shah,acting as a Neurosurgeon for Wyvonnia Lora, MD.,have documented all relevant documentation on the behalf of Wyvonnia Lora, MD,as directed by  Wyvonnia Lora, MD while in the presence of Wyvonnia Lora, MD.  .I have reviewed the above documentation for accuracy and completeness, and I agree with the above. [name] MD

## 2023-09-22 ENCOUNTER — Other Ambulatory Visit: Payer: Self-pay

## 2023-09-25 ENCOUNTER — Encounter: Payer: Self-pay | Admitting: Hematology

## 2023-10-03 ENCOUNTER — Inpatient Hospital Stay: Payer: Medicare HMO

## 2023-10-03 VITALS — BP 104/71 | HR 69 | Temp 98.7°F | Resp 16 | Wt 171.5 lb

## 2023-10-03 DIAGNOSIS — C9002 Multiple myeloma in relapse: Secondary | ICD-10-CM

## 2023-10-03 DIAGNOSIS — Z7189 Other specified counseling: Secondary | ICD-10-CM

## 2023-10-03 DIAGNOSIS — Z5112 Encounter for antineoplastic immunotherapy: Secondary | ICD-10-CM | POA: Diagnosis not present

## 2023-10-03 LAB — CBC WITH DIFFERENTIAL (CANCER CENTER ONLY)
Abs Immature Granulocytes: 0.01 10*3/uL (ref 0.00–0.07)
Basophils Absolute: 0 10*3/uL (ref 0.0–0.1)
Basophils Relative: 1 %
Eosinophils Absolute: 0.3 10*3/uL (ref 0.0–0.5)
Eosinophils Relative: 6 %
HCT: 33.7 % — ABNORMAL LOW (ref 39.0–52.0)
Hemoglobin: 10.6 g/dL — ABNORMAL LOW (ref 13.0–17.0)
Immature Granulocytes: 0 %
Lymphocytes Relative: 28 %
Lymphs Abs: 1.3 10*3/uL (ref 0.7–4.0)
MCH: 29.4 pg (ref 26.0–34.0)
MCHC: 31.5 g/dL (ref 30.0–36.0)
MCV: 93.6 fL (ref 80.0–100.0)
Monocytes Absolute: 0.6 10*3/uL (ref 0.1–1.0)
Monocytes Relative: 14 %
Neutro Abs: 2.4 10*3/uL (ref 1.7–7.7)
Neutrophils Relative %: 51 %
Platelet Count: 227 10*3/uL (ref 150–400)
RBC: 3.6 MIL/uL — ABNORMAL LOW (ref 4.22–5.81)
RDW: 14.8 % (ref 11.5–15.5)
WBC Count: 4.6 10*3/uL (ref 4.0–10.5)
nRBC: 0 % (ref 0.0–0.2)

## 2023-10-03 LAB — COMPREHENSIVE METABOLIC PANEL
ALT: 10 U/L (ref 0–44)
AST: 12 U/L — ABNORMAL LOW (ref 15–41)
Albumin: 3.8 g/dL (ref 3.5–5.0)
Alkaline Phosphatase: 75 U/L (ref 38–126)
Anion gap: 6 (ref 5–15)
BUN: 9 mg/dL (ref 8–23)
CO2: 27 mmol/L (ref 22–32)
Calcium: 8.1 mg/dL — ABNORMAL LOW (ref 8.9–10.3)
Chloride: 109 mmol/L (ref 98–111)
Creatinine, Ser: 1.34 mg/dL — ABNORMAL HIGH (ref 0.61–1.24)
GFR, Estimated: 55 mL/min — ABNORMAL LOW (ref >60)
Glucose, Bld: 96 mg/dL (ref 70–99)
Potassium: 3.3 mmol/L — ABNORMAL LOW (ref 3.5–5.1)
Sodium: 142 mmol/L (ref 135–145)
Total Bilirubin: 0.4 mg/dL (ref 0.3–1.2)
Total Protein: 6.3 g/dL — ABNORMAL LOW (ref 6.5–8.1)

## 2023-10-03 MED ORDER — DIPHENHYDRAMINE HCL 25 MG PO CAPS
50.0000 mg | ORAL_CAPSULE | Freq: Once | ORAL | Status: AC
  Administered 2023-10-03: 50 mg via ORAL
  Filled 2023-10-03: qty 2

## 2023-10-03 MED ORDER — FAMOTIDINE 20 MG PO TABS
20.0000 mg | ORAL_TABLET | Freq: Once | ORAL | Status: AC
  Administered 2023-10-03: 20 mg via ORAL
  Filled 2023-10-03: qty 1

## 2023-10-03 MED ORDER — DEXAMETHASONE 4 MG PO TABS
12.0000 mg | ORAL_TABLET | Freq: Once | ORAL | Status: AC
  Administered 2023-10-03: 12 mg via ORAL
  Filled 2023-10-03: qty 3

## 2023-10-03 MED ORDER — DARATUMUMAB-HYALURONIDASE-FIHJ 1800-30000 MG-UT/15ML ~~LOC~~ SOLN
1800.0000 mg | Freq: Once | SUBCUTANEOUS | Status: AC
  Administered 2023-10-03: 1800 mg via SUBCUTANEOUS
  Filled 2023-10-03: qty 15

## 2023-10-03 MED ORDER — ACETAMINOPHEN 325 MG PO TABS
650.0000 mg | ORAL_TABLET | Freq: Once | ORAL | Status: AC
  Administered 2023-10-03: 650 mg via ORAL
  Filled 2023-10-03: qty 2

## 2023-10-03 MED ORDER — DENOSUMAB 120 MG/1.7ML ~~LOC~~ SOLN
120.0000 mg | Freq: Once | SUBCUTANEOUS | Status: AC
  Administered 2023-10-03: 120 mg via SUBCUTANEOUS
  Filled 2023-10-03: qty 1.7

## 2023-10-03 NOTE — Patient Instructions (Addendum)
[place] [facility]  Discharge Instructions: Thank you for choosing [facility] to provide your oncology and hematology care.   If you have a lab appointment with the Cancer Center, please go directly to the Cancer Center and check in at the registration area.   Wear comfortable clothing and clothing appropriate for easy access to any Portacath or PICC line.   We strive to give you quality time with your provider. You may need to reschedule your appointment if you arrive late (15 or more minutes).  Arriving late affects you and other patients whose appointments are after yours.  Also, if you miss three or more appointments without notifying the office, you may be dismissed from the clinic at the provider's discretion.      For prescription refill requests, have your pharmacy contact our office and allow 72 hours for refills to be completed.    Today you received the following chemotherapy and/or immunotherapy agents : Darzalex Faspro      To help prevent nausea and vomiting after your treatment, we encourage you to take your nausea medication as directed.  BELOW ARE SYMPTOMS THAT SHOULD BE REPORTED IMMEDIATELY: *FEVER GREATER THAN 100.4 F (38 C) OR HIGHER *CHILLS OR SWEATING *NAUSEA AND VOMITING THAT IS NOT CONTROLLED WITH YOUR NAUSEA MEDICATION *UNUSUAL SHORTNESS OF BREATH *UNUSUAL BRUISING OR BLEEDING *URINARY PROBLEMS (pain or burning when urinating, or frequent urination) *BOWEL PROBLEMS (unusual diarrhea, constipation, pain near the anus) TENDERNESS IN MOUTH AND THROAT WITH OR WITHOUT PRESENCE OF ULCERS (sore throat, sores in mouth, or a toothache) UNUSUAL RASH, SWELLING OR PAIN  UNUSUAL VAGINAL DISCHARGE OR ITCHING   Items with * indicate a potential emergency and should be followed up as soon as possible or go to the Emergency Department if any problems should occur.  Please show the CHEMOTHERAPY ALERT CARD or IMMUNOTHERAPY ALERT CARD  at check-in to the Emergency Department and triage nurse.  Should you have questions after your visit or need to cancel or reschedule your appointment, please contact [place] [facility]  Dept: [phone]  and follow the prompts.  Office hours are 8:00 a.m. to 4:30 p.m. Monday - Friday. Please note that voicemails left after 4:00 p.m. may not be returned until the following business day.  We are closed weekends and major holidays. You have access to a nurse at all times for urgent questions. Please call the main number to the clinic Dept: [phone] and follow the prompts.   For any non-urgent questions, you may also contact your provider using MyChart. We now offer e-Visits for anyone 8 and older to request care online for non-urgent symptoms. For details visit [email or web address].   Also download the MyChart app! Go to the app store, search "MyChart", open the app, select [place], and log in with your MyChart username and password.  Denosumab Injection (Oncology) What is this medication? DENOSUMAB (den oh SUE mab) prevents weakened bones caused by cancer. It may also be used to treat noncancerous bone tumors that cannot be removed by surgery. It can also be used to treat high calcium levels in the blood caused by cancer. It works by blocking a protein that causes bones to break down quickly. This slows down the release of calcium from bones, which lowers calcium levels in your blood. It also makes your bones stronger and less likely to break (fracture). This medicine may be used for other purposes; ask your health care provider  or pharmacist if you have questions. COMMON BRAND NAME(S): XGEVA What should I tell my care team before I take this medication? They need to know if you have any of these conditions: Dental disease Having surgery or tooth extraction Infection Kidney disease Low levels of calcium or vitamin D in the blood Malnutrition On  hemodialysis Skin conditions or sensitivity Thyroid or parathyroid disease An unusual reaction to denosumab, other medications, foods, dyes, or preservatives Pregnant or trying to get pregnant Breast-feeding How should I use this medication? This medication is for injection under the skin. It is given by your care team in a hospital or clinic setting. A special MedGuide will be given to you before each treatment. Be sure to read this information carefully each time. Talk to your care team about the use of this medication in children. While it may be prescribed for children as young as 13 years for selected conditions, precautions do apply. Overdosage: If you think you have taken too much of this medicine contact a poison control center or emergency room at once. NOTE: This medicine is only for you. Do not share this medicine with others. What if I miss a dose? Keep appointments for follow-up doses. It is important not to miss your dose. Call your care team if you are unable to keep an appointment. What may interact with this medication? Do not take this medication with any of the following: Other medications containing denosumab This medication may also interact with the following: Medications that lower your chance of fighting infection Steroid medications, such as prednisone or cortisone This list may not describe all possible interactions. Give your health care provider a list of all the medicines, herbs, non-prescription drugs, or dietary supplements you use. Also tell them if you smoke, drink alcohol, or use illegal drugs. Some items may interact with your medicine. What should I watch for while using this medication? Your condition will be monitored carefully while you are receiving this medication. You may need blood work while taking this medication. This medication may increase your risk of getting an infection. Call your care team for advice if you get a fever, chills, sore throat,  or other symptoms of a cold or flu. Do not treat yourself. Try to avoid being around people who are sick. You should make sure you get enough calcium and vitamin D while you are taking this medication, unless your care team tells you not to. Discuss the foods you eat and the vitamins you take with your care team. Some people who take this medication have severe bone, joint, or muscle pain. This medication may also increase your risk for jaw problems or a broken thigh bone. Tell your care team right away if you have severe pain in your jaw, bones, joints, or muscles. Tell your care team if you have any pain that does not go away or that gets worse. Talk to your care team if you may be pregnant. Serious birth defects can occur if you take this medication during pregnancy and for 5 months after the last dose. You will need a negative pregnancy test before starting this medication. Contraception is recommended while taking this medication and for 5 months after the last dose. Your care team can help you find the option that works for you. What side effects may I notice from receiving this medication? Side effects that you should report to your care team as soon as possible: Allergic reactions--skin rash, itching, hives, swelling of the face, lips, tongue,  or throat Bone, joint, or muscle pain Low calcium level--muscle pain or cramps, confusion, tingling, or numbness in the hands or feet Osteonecrosis of the jaw--pain, swelling, or redness in the mouth, numbness of the jaw, poor healing after dental work, unusual discharge from the mouth, visible bones in the mouth Side effects that usually do not require medical attention (report to your care team if they continue or are bothersome): Cough Diarrhea Fatigue Headache Nausea This list may not describe all possible side effects. Call your doctor for medical advice about side effects. You may report side effects to FDA at 1-800-FDA-1088. Where should I keep  my medication? This medication is given in a hospital or clinic. It will not be stored at home. NOTE: This sheet is a summary. It may not cover all possible information. If you have questions about this medicine, talk to your doctor, pharmacist, or health care provider.  2024 Elsevier/Gold Standard (2022-04-27 00:00:00)

## 2023-10-03 NOTE — Progress Notes (Signed)
Per Dr. Candise Che, ok for [facility] today with corrected calcium 8.26 mg/dL

## 2023-10-04 LAB — KAPPA/LAMBDA LIGHT CHAINS
Kappa free light chain: 25.7 mg/L — ABNORMAL HIGH (ref 3.3–19.4)
Kappa, lambda light chain ratio: 0.95 (ref 0.26–1.65)
Lambda free light chains: 27 mg/L — ABNORMAL HIGH (ref 5.7–26.3)

## 2023-10-06 ENCOUNTER — Other Ambulatory Visit: Payer: Self-pay

## 2023-10-06 DIAGNOSIS — C9002 Multiple myeloma in relapse: Secondary | ICD-10-CM

## 2023-10-06 DIAGNOSIS — Z7189 Other specified counseling: Secondary | ICD-10-CM

## 2023-10-06 LAB — MULTIPLE MYELOMA PANEL, SERUM
Albumin SerPl Elph-Mcnc: 3.3 g/dL (ref 2.9–4.4)
Albumin/Glob SerPl: 1.4 (ref 0.7–1.7)
Alpha 1: 0.3 g/dL (ref 0.0–0.4)
Alpha2 Glob SerPl Elph-Mcnc: 0.8 g/dL (ref 0.4–1.0)
B-Globulin SerPl Elph-Mcnc: 0.8 g/dL (ref 0.7–1.3)
Gamma Glob SerPl Elph-Mcnc: 0.6 g/dL (ref 0.4–1.8)
Globulin, Total: 2.5 g/dL (ref 2.2–3.9)
IgA: 68 mg/dL (ref 61–437)
IgG (Immunoglobin G), Serum: 800 mg/dL (ref 603–1613)
IgM (Immunoglobulin M), Srm: 17 mg/dL (ref 15–143)
M Protein SerPl Elph-Mcnc: 0.4 g/dL — ABNORMAL HIGH
Total Protein ELP: 5.8 g/dL — ABNORMAL LOW (ref 6.0–8.5)

## 2023-10-06 MED ORDER — LENALIDOMIDE 10 MG PO CAPS: 10.0000 mg | ORAL_CAPSULE | Freq: Every day | ORAL | 0 refills | Status: DC

## 2023-10-17 ENCOUNTER — Inpatient Hospital Stay: Payer: Medicare HMO | Admitting: Hematology

## 2023-10-17 ENCOUNTER — Inpatient Hospital Stay: Payer: Medicare HMO

## 2023-10-17 ENCOUNTER — Inpatient Hospital Stay (HOSPITAL_BASED_OUTPATIENT_CLINIC_OR_DEPARTMENT_OTHER): Payer: Medicare HMO

## 2023-10-17 ENCOUNTER — Encounter: Payer: Self-pay | Admitting: *Deleted

## 2023-10-17 VITALS — BP 126/73 | HR 74 | Temp 98.4°F | Resp 18 | Wt 172.1 lb

## 2023-10-17 DIAGNOSIS — Z5112 Encounter for antineoplastic immunotherapy: Secondary | ICD-10-CM | POA: Diagnosis not present

## 2023-10-17 DIAGNOSIS — Z7189 Other specified counseling: Secondary | ICD-10-CM

## 2023-10-17 DIAGNOSIS — C9002 Multiple myeloma in relapse: Secondary | ICD-10-CM | POA: Diagnosis not present

## 2023-10-17 LAB — CBC WITH DIFFERENTIAL (CANCER CENTER ONLY)
Abs Immature Granulocytes: 0.02 10*3/uL (ref 0.00–0.07)
Basophils Absolute: 0.1 10*3/uL (ref 0.0–0.1)
Basophils Relative: 2 %
Eosinophils Absolute: 0.2 10*3/uL (ref 0.0–0.5)
Eosinophils Relative: 4 %
HCT: 35 % — ABNORMAL LOW (ref 39.0–52.0)
Hemoglobin: 11.1 g/dL — ABNORMAL LOW (ref 13.0–17.0)
Immature Granulocytes: 0 %
Lymphocytes Relative: 28 %
Lymphs Abs: 1.5 10*3/uL (ref 0.7–4.0)
MCH: 28.6 pg (ref 26.0–34.0)
MCHC: 31.7 g/dL (ref 30.0–36.0)
MCV: 90.2 fL (ref 80.0–100.0)
Monocytes Absolute: 0.3 10*3/uL (ref 0.1–1.0)
Monocytes Relative: 5 %
Neutro Abs: 3.3 10*3/uL (ref 1.7–7.7)
Neutrophils Relative %: 61 %
Platelet Count: 233 10*3/uL (ref 150–400)
RBC: 3.88 MIL/uL — ABNORMAL LOW (ref 4.22–5.81)
RDW: 15.3 % (ref 11.5–15.5)
WBC Count: 5.4 10*3/uL (ref 4.0–10.5)
nRBC: 0 % (ref 0.0–0.2)

## 2023-10-17 LAB — COMPREHENSIVE METABOLIC PANEL
ALT: 11 U/L (ref 0–44)
AST: 13 U/L — ABNORMAL LOW (ref 15–41)
Albumin: 3.8 g/dL (ref 3.5–5.0)
Alkaline Phosphatase: 65 U/L (ref 38–126)
Anion gap: 5 (ref 5–15)
BUN: 11 mg/dL (ref 8–23)
CO2: 28 mmol/L (ref 22–32)
Calcium: 8.1 mg/dL — ABNORMAL LOW (ref 8.9–10.3)
Chloride: 108 mmol/L (ref 98–111)
Creatinine, Ser: 1.28 mg/dL — ABNORMAL HIGH (ref 0.61–1.24)
GFR, Estimated: 58 mL/min — ABNORMAL LOW (ref >60)
Glucose, Bld: 87 mg/dL (ref 70–99)
Potassium: 3.3 mmol/L — ABNORMAL LOW (ref 3.5–5.1)
Sodium: 141 mmol/L (ref 135–145)
Total Bilirubin: 0.5 mg/dL (ref 0.3–1.2)
Total Protein: 6.3 g/dL — ABNORMAL LOW (ref 6.5–8.1)

## 2023-10-17 MED ORDER — DARATUMUMAB-HYALURONIDASE-FIHJ 1800-30000 MG-UT/15ML ~~LOC~~ SOLN
1800.0000 mg | Freq: Once | SUBCUTANEOUS | Status: AC
  Administered 2023-10-17: 1800 mg via SUBCUTANEOUS
  Filled 2023-10-17: qty 15

## 2023-10-17 MED ORDER — FAMOTIDINE 20 MG PO TABS
20.0000 mg | ORAL_TABLET | Freq: Once | ORAL | Status: AC
  Administered 2023-10-17: 20 mg via ORAL
  Filled 2023-10-17: qty 1

## 2023-10-17 MED ORDER — DEXAMETHASONE 4 MG PO TABS
12.0000 mg | ORAL_TABLET | Freq: Once | ORAL | Status: AC
Start: 2023-10-17 — End: 2023-10-17
  Administered 2023-10-17: 12 mg via ORAL
  Filled 2023-10-17: qty 3

## 2023-10-17 MED ORDER — DIPHENHYDRAMINE HCL 25 MG PO CAPS
50.0000 mg | ORAL_CAPSULE | Freq: Once | ORAL | Status: AC
  Administered 2023-10-17: 50 mg via ORAL
  Filled 2023-10-17: qty 2

## 2023-10-17 MED ORDER — ACETAMINOPHEN 325 MG PO TABS
650.0000 mg | ORAL_TABLET | Freq: Once | ORAL | Status: AC
  Administered 2023-10-17: 650 mg via ORAL
  Filled 2023-10-17: qty 2

## 2023-10-17 NOTE — Progress Notes (Signed)
Patient seen by Dr. Kale  Vitals are within treatment parameters.  Labs reviewed: and are within treatment parameters.  Per physician team, patient is ready for treatment and there are NO modifications to the treatment plan.  

## 2023-10-17 NOTE — Patient Instructions (Signed)
[facility] [facility]  Discharge Instructions: Thank you for choosing [facility] to provide your oncology and hematology care.   If you have a lab appointment with the Cancer Center, please go directly to the Cancer Center and check in at the registration area.   Wear comfortable clothing and clothing appropriate for easy access to any Portacath or PICC line.   We strive to give you quality time with your provider. You may need to reschedule your appointment if you arrive late (15 or more minutes).  Arriving late affects you and other patients whose appointments are after yours.  Also, if you miss three or more appointments without notifying the office, you may be dismissed from the clinic at the provider's discretion.      For prescription refill requests, have your pharmacy contact our office and allow 72 hours for refills to be completed.    Today you received the following chemotherapy and/or immunotherapy agents : Darzalex Faspro      To help prevent nausea and vomiting after your treatment, we encourage you to take your nausea medication as directed.  BELOW ARE SYMPTOMS THAT SHOULD BE REPORTED IMMEDIATELY: *FEVER GREATER THAN 100.4 F (38 C) OR HIGHER *CHILLS OR SWEATING *NAUSEA AND VOMITING THAT IS NOT CONTROLLED WITH YOUR NAUSEA MEDICATION *UNUSUAL SHORTNESS OF BREATH *UNUSUAL BRUISING OR BLEEDING *URINARY PROBLEMS (pain or burning when urinating, or frequent urination) *BOWEL PROBLEMS (unusual diarrhea, constipation, pain near the anus) TENDERNESS IN MOUTH AND THROAT WITH OR WITHOUT PRESENCE OF ULCERS (sore throat, sores in mouth, or a toothache) UNUSUAL RASH, SWELLING OR PAIN  UNUSUAL VAGINAL DISCHARGE OR ITCHING   Items with * indicate a potential emergency and should be followed up as soon as possible or go to the Emergency Department if any problems should occur.  Please show the CHEMOTHERAPY ALERT CARD or IMMUNOTHERAPY ALERT CARD  at check-in to the Emergency Department and triage nurse.  Should you have questions after your visit or need to cancel or reschedule your appointment, please contact [place] [facility]  Dept: [phone]  and follow the prompts.  Office hours are 8:00 a.m. to 4:30 p.m. Monday - Friday. Please note that voicemails left after 4:00 p.m. may not be returned until the following business day.  We are closed weekends and major holidays. You have access to a nurse at all times for urgent questions. Please call the main number to the clinic Dept: [phone] and follow the prompts.   For any non-urgent questions, you may also contact your provider using MyChart. We now offer e-Visits for anyone 69 and older to request care online for non-urgent symptoms. For details visit [email or web address].   Also download the MyChart app! Go to the app store, search "MyChart", open the app, select [place], and log in with your MyChart username and password.  Denosumab Injection (Oncology) What is this medication? DENOSUMAB (den oh SUE mab) prevents weakened bones caused by cancer. It may also be used to treat noncancerous bone tumors that cannot be removed by surgery. It can also be used to treat high calcium levels in the blood caused by cancer. It works by blocking a protein that causes bones to break down quickly. This slows down the release of calcium from bones, which lowers calcium levels in your blood. It also makes your bones stronger and less likely to break (fracture). This medicine may be used for other purposes; ask your health care provider  or pharmacist if you have questions. COMMON BRAND NAME(S): XGEVA What should I tell my care team before I take this medication? They need to know if you have any of these conditions: Dental disease Having surgery or tooth extraction Infection Kidney disease Low levels of calcium or vitamin D in the blood Malnutrition On  hemodialysis Skin conditions or sensitivity Thyroid or parathyroid disease An unusual reaction to denosumab, other medications, foods, dyes, or preservatives Pregnant or trying to get pregnant Breast-feeding How should I use this medication? This medication is for injection under the skin. It is given by your care team in a hospital or clinic setting. A special MedGuide will be given to you before each treatment. Be sure to read this information carefully each time. Talk to your care team about the use of this medication in children. While it may be prescribed for children as young as 13 years for selected conditions, precautions do apply. Overdosage: If you think you have taken too much of this medicine contact a poison control center or emergency room at once. NOTE: This medicine is only for you. Do not share this medicine with others. What if I miss a dose? Keep appointments for follow-up doses. It is important not to miss your dose. Call your care team if you are unable to keep an appointment. What may interact with this medication? Do not take this medication with any of the following: Other medications containing denosumab This medication may also interact with the following: Medications that lower your chance of fighting infection Steroid medications, such as prednisone or cortisone This list may not describe all possible interactions. Give your health care provider a list of all the medicines, herbs, non-prescription drugs, or dietary supplements you use. Also tell them if you smoke, drink alcohol, or use illegal drugs. Some items may interact with your medicine. What should I watch for while using this medication? Your condition will be monitored carefully while you are receiving this medication. You may need blood work while taking this medication. This medication may increase your risk of getting an infection. Call your care team for advice if you get a fever, chills, sore throat,  or other symptoms of a cold or flu. Do not treat yourself. Try to avoid being around people who are sick. You should make sure you get enough calcium and vitamin D while you are taking this medication, unless your care team tells you not to. Discuss the foods you eat and the vitamins you take with your care team. Some people who take this medication have severe bone, joint, or muscle pain. This medication may also increase your risk for jaw problems or a broken thigh bone. Tell your care team right away if you have severe pain in your jaw, bones, joints, or muscles. Tell your care team if you have any pain that does not go away or that gets worse. Talk to your care team if you may be pregnant. Serious birth defects can occur if you take this medication during pregnancy and for 5 months after the last dose. You will need a negative pregnancy test before starting this medication. Contraception is recommended while taking this medication and for 5 months after the last dose. Your care team can help you find the option that works for you. What side effects may I notice from receiving this medication? Side effects that you should report to your care team as soon as possible: Allergic reactions--skin rash, itching, hives, swelling of the face, lips, tongue,  or throat Bone, joint, or muscle pain Low calcium level--muscle pain or cramps, confusion, tingling, or numbness in the hands or feet Osteonecrosis of the jaw--pain, swelling, or redness in the mouth, numbness of the jaw, poor healing after dental work, unusual discharge from the mouth, visible bones in the mouth Side effects that usually do not require medical attention (report to your care team if they continue or are bothersome): Cough Diarrhea Fatigue Headache Nausea This list may not describe all possible side effects. Call your doctor for medical advice about side effects. You may report side effects to FDA at 1-800-FDA-1088. Where should I keep  my medication? This medication is given in a hospital or clinic. It will not be stored at home. NOTE: This sheet is a summary. It may not cover all possible information. If you have questions about this medicine, talk to your doctor, pharmacist, or health care provider.  2024 Elsevier/Gold Standard (2022-04-27 00:00:00)

## 2023-10-22 ENCOUNTER — Other Ambulatory Visit: Payer: Self-pay

## 2023-10-23 ENCOUNTER — Encounter: Payer: Self-pay | Admitting: Hematology

## 2023-10-23 NOTE — Progress Notes (Signed)
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  Patient was seen by me as an inpatient on 05/20/2023.  He noted that his vision may be slightly better with the steroid. Had some mild insomnia but it was not too bothersome. Has been seen by radiation oncology and is going to be set up for CT simulation and to start radiation 05/21/2023.  Did have a CT chest abdomen pelvis without contrast to evaluate for any other soft tissue metastatic disease and whole-body skeletal survey to determine other overt progression and to rule out the possibility of further source of metastatic disease.  Today, he is accompanied by two family members. He reports that he is feeling well overall. He reports that his vision has improved and returned to baseline. He denies any glares in his vision which were previously present. He is able to count fingers and notes improved sight of colors on television.   His p.o. intake is normal and he notes drinking 4 16oz bottles daily. He denies any fevers, chills, night sweats, new back pain, fatigue, posterior neck pain, or abdominal pain.  He reports some nausea with previously taking Revlimid. Patient reports that he has an upcoming dental appointment.  INTERVAL HISTORY:  Marcus Hatfield is a wonderful 77 y.o. male who is here for continued valuation and management of his multiple myeloma. He notes no acute new toxicities from his current treatment regimen.  No acute new focal symptoms.  Vision is stable. Labs done today were discussed in detail with him. She is accompanied by his family members for this  visit. Improving p.o. intake.  MEDICAL HISTORY:  Past Medical History:  Diagnosis Date   Anemia    Arthritis    CKD (chronic kidney disease) stage 3, GFR 30-59 ml/min (HCC) 20015   Creat 1.9   Hypertension    Multiple myeloma (HCC) 2016   [facility] heme onc   Prostate disorder 02/2017    SURGICAL HISTORY: Past Surgical History:  Procedure Laterality Date   BIOPSY  06/06/2023   Procedure: BIOPSY;  Surgeon: [name], MD;  Location: [facility] ENDOSCOPY;  Service: Gastroenterology;;   ESOPHAGOGASTRODUODENOSCOPY (EGD) WITH PROPOFOL N/A 06/06/2023   Procedure: ESOPHAGOGASTRODUODENOSCOPY (EGD) WITH PROPOFOL;  Surgeon: [name], MD;  Location: [facility] ENDOSCOPY;  Service: Gastroenterology;  Laterality: N/A;   EYE SURGERY Bilateral 01/30/2018   KNEE SURGERY     over ten years ago   NEPHRECTOMY Right 03/2015   ROBOTIC ASSITED PARTIAL NEPHRECTOMY Left 07/07/2021   Procedure: XI ROBOTIC ASSITED PARTIAL NEPHRECTOMY;  Surgeon: [name], MD;  Location: [facility] ORS;  Service: Urology;  Laterality: Left;  5 HRS   XI ROBOTIC ASSISTED SIMPLE PROSTATECTOMY N/A 07/07/2021   Procedure: XI ROBOTIC ASSISTED SIMPLE PROSTATECTOMY;  Surgeon: [name], MD;  Location: [facility] ORS;  Service: Urology;  Laterality: N/A;    SOCIAL HISTORY: Social History   Socioeconomic History   Marital status: Married    Spouse name: Museum/gallery curator   Number of children: 2   Years of education: Not on file   Highest education level: Not on file  Occupational History   Occupation: Retired  Tobacco Use   Smoking status: Never  Smokeless tobacco: Never  Vaping Use   Vaping status: Never Used  Substance and Sexual Activity   Alcohol use: No   Drug use: No   Sexual activity: Not on file  Other Topics Concern   Not on file  Social History Narrative   Not on file   Social Determinants of Health   Financial Resource Strain: Not on file  Food Insecurity: Patient Declined (06/05/2023)   Hunger Vital Sign    Worried  About Running Out of Food in the Last Year: Patient declined    Ran Out of Food in the Last Year: Patient declined  Transportation Needs: Patient Declined (06/05/2023)   PRAPARE - Administrator, Civil Service (Medical): Patient declined    Lack of Transportation (Non-Medical): Patient declined  Physical Activity: Not on file  Stress: Not on file  Social Connections: Not on file  Intimate Partner Violence: Patient Declined (06/05/2023)   Humiliation, Afraid, Rape, and Kick questionnaire    Fear of Current or Ex-Partner: Patient declined    Emotionally Abused: Patient declined    Physically Abused: Patient declined    Sexually Abused: Patient declined    FAMILY HISTORY: Family History  Problem Relation Age of Onset   Diabetes Father    Hypertension Brother     ALLERGIES:  is allergic to penicillins, latex, and tape.  MEDICATIONS:  Current Outpatient Medications  Medication Sig Dispense Refill   acyclovir (ZOVIRAX) 400 MG tablet Take 1 tablet (400 mg total) by mouth 2 (two) times daily. 60 tablet 11   amLODipine (NORVASC) 10 MG tablet Take 0.5 tablets (5 mg total) by mouth daily.     aspirin 81 MG chewable tablet Chew 81 mg by mouth daily. (Patient not taking: Reported on 08/31/2023)     carvedilol (COREG) 6.25 MG tablet Take 1 tablet (6.25 mg total) by mouth 2 (two) times daily with a meal.     diclofenac Sodium (VOLTAREN) 1 % GEL Apply 2 g topically daily as needed (for pain).     docusate sodium (COLACE) 100 MG capsule Take 1 capsule (100 mg total) by mouth daily as needed for mild constipation.     ferrous sulfate 325 (65 FE) MG tablet Take 1 tablet (325 mg total) by mouth 2 (two) times daily as needed (iron).     finasteride (PROSCAR) 5 MG tablet Take 5 mg by mouth daily.     fluticasone (FLONASE) 50 MCG/ACT nasal spray Place 2 sprays into both nostrils daily as needed for allergies.     furosemide (LASIX) 20 MG tablet Take 0.5 tablets (10 mg total) by mouth every  other day. 30 tablet    lactulose (CHRONULAC) 10 GM/15ML solution Take 10 g by mouth daily as needed for constipation.  5   lenalidomide (REVLIMID) 10 MG capsule Take 1 capsule (10 mg total) by mouth daily. Take for 21 days on, 7 days off, repeat every 28 days. 21 capsule 0   ondansetron (ZOFRAN) 8 MG tablet Take 1 tablet (8 mg total) by mouth every 8 (eight) hours as needed for nausea or vomiting. 30 tablet 1   oxyCODONE-acetaminophen (PERCOCET/ROXICET) 5-325 MG tablet Take 0.5-1 tablets by mouth every 6 (six) hours as needed for severe pain. 20 tablet 0   pantoprazole (PROTONIX) 40 MG tablet Take 1 tablet (40 mg total) by mouth 2 (two) times daily. Take bid x 10 weeks, followed by daily thereafter 90 tablet 3   prochlorperazine (COMPAZINE) 10 MG tablet Take 1 tablet (10  mg total) by mouth every 6 (six) hours as needed for nausea or vomiting. 30 tablet 1   tamsulosin (FLOMAX) 0.4 MG CAPS capsule Take 1 capsule (0.4 mg total) by mouth daily after supper. 30 capsule 1   telmisartan (MICARDIS) 80 MG tablet Take 80 mg by mouth daily.     No current facility-administered medications for this visit.    REVIEW OF SYSTEMS:    10 Point review of Systems was done is negative except as noted above.  PHYSICAL EXAMINATION: ECOG PERFORMANCE STATUS: 2 - Symptomatic, <50% confined to bed .BP 126/73   Pulse 74   Temp 98.4 F (36.9 C)   Resp 18   Wt 172 lb 1.6 oz (78.1 kg)   SpO2 100%   BMI 24.69 kg/m   GENERAL:alert, in no acute distress and comfortable SKIN: no acute rashes, no significant lesions EYES: conjunctiva are pink and non-injected, sclera anicteric OROPHARYNX: MMM, no exudates, no oropharyngeal erythema or ulceration NECK: supple, no JVD LYMPH:  no palpable lymphadenopathy in the cervical, axillary or inguinal regions LUNGS: clear to auscultation b/l with normal respiratory effort HEART: regular rate & rhythm ABDOMEN:  normoactive bowel sounds , non tender, not distended. Extremity:  no pedal edema PSYCH: alert & oriented x 3 with fluent speech NEURO: no focal motor/sensory deficits  LABORATORY DATA:  I have reviewed the data as listed .    Latest Ref Rng & Units 10/17/2023   10:37 AM 10/03/2023    1:09 PM 09/19/2023    9:51 AM  CBC  WBC 4.0 - 10.5 K/uL 5.4  4.6  6.0   Hemoglobin 13.0 - 17.0 g/dL 19.1  47.8  29.5   Hematocrit 39.0 - 52.0 % 35.0  33.7  33.0   Platelets 150 - 400 K/uL 233  227  228    .    Latest Ref Rng & Units 10/17/2023   10:37 AM 10/03/2023    1:09 PM 09/19/2023    9:51 AM  CMP  Glucose 70 - 99 mg/dL 87  96  69   BUN 8 - 23 mg/dL 11  9  13    Creatinine 0.61 - 1.24 mg/dL 6.21  3.08  6.57   Sodium 135 - 145 mmol/L 141  142  142   Potassium 3.5 - 5.1 mmol/L 3.3  3.3  3.7   Chloride 98 - 111 mmol/L 108  109  109   CO2 22 - 32 mmol/L 28  27  27    Calcium 8.9 - 10.3 mg/dL 8.1  8.1  8.4   Total Protein 6.5 - 8.1 g/dL 6.3  6.3  6.1   Total Bilirubin 0.3 - 1.2 mg/dL 0.5  0.4  0.4   Alkaline Phos 38 - 126 U/L 65  75  93   AST 15 - 41 U/L 13  12  10    ALT 0 - 44 U/L 11  10  5       RADIOGRAPHIC STUDIES: I have personally reviewed the radiological images as listed and agreed with the findings in the report. No results found.  ASSESSMENT & PLAN:   77year-old male with   #1 Progressive IgG kappa myeloma: Initially diagnosed in 2016 as stage II disease with standard risk cytogenetics today. 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 Status post treatment as noted above. Last seen by Dr. Lalla Brothers at [facility] in January 2024 and noted to have progressive disease with increase in his IgG kappa M spike at 2.87  g/dL.   Recent light chains done on 05/20/2023 at [facility] showed increase in free kappa light chains to 603 with a kappa lambda ratio of 25 this is up from kappa light chains of 402 with a kappa lambda ratio of 12.6 on 01/03/2023. CBC shows relatively stable hemoglobin of 11.3 with normal WBC count and  platelets BMP shows stable chronic kidney disease with a creatinine of 1.87.  No overt hypercalcemia with a calcium of 9   #2 recent right clavicular pain with x-ray on 05/06/2023 showing mildly displaced likely pathologic fracture of the medial right clavicle.  Multiple other lytic lesions noted.   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  CT head done today 05/20/2023 shows  Large mass at the skull base, eroding and expanding the clivus, also involving the sphenoid bone and the petrous portions of both temporal bones. The mass broadly abuts both optic nerves between the chiasm and orbital apices. Mass also broadly involves both cavernous sinuses. 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 Recommend further assessment with brain MRI without and with contrast.   MRI brain 05/20/2023 1. 7.8 x 5.6 x 3.3 cm enhancing mass lesion at the skull base extending along the petrous ridge bilaterally, greater on the right. 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 There are some intrinsic T2 signal scratched at there is some intrinsic T2 signal and chordoma still considered. However, given the multiple other enhancing lesions throughout the skull, this most likely represents metastatic disease. 2. No acute intracranial abnormality. 3. Mild atrophy and white matter changes are likely within normal limits for age. 4. Right mastoid effusion secondary to obstruction of the eustachian tube. 5. Minimal right maxillary sinus disease.   ADDENDUM: Lytic lesions have been identified previously with this patient. 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   #4 hyponatremia   #5 history of renal cell carcinoma right nephrectomy on 02/03/2016.  He received cryotherapy to his left renal mass in April 2017. Surgically  resected by Dr. [name] at [facility] on July 07, 2021. 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 Tumor is limited to the kidney (pT1a).   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  CBC is stable with a hemoglobin of 11.1 CMP stable with potassium of 3.3 stable chronic kidney disease Last myeloma labs from 10/03/2023 showed M spike of 0.4 g/dL Serum kappa lambda free light chain ratio is within normal limits -Continue Revlimid from today. 3 week on 1 week off. Start 81 mg baby aspirin as well.  -Discussed that the goal of treatment.  -Recommend influenza vaccine, COVID-19 Booster, RSV vaccine, and other age related vaccines.  -Continue Protonix and iron supplement.   -Recommend eating as much as possible.  -Patient has been tolerating his treatment well without any new or severe toxicities. -Patient can proceed with treatment today.  -Continue iron supplement, twice every week.    FOLLOW-UP:  Continue daratumumab per integrated scheduling MD visit in 4 weeks Continue Xgeva every 4 weeks   The total time spent in the appointment was 30 minutes*.  All of the patient's questions were answered with  apparent satisfaction. The patient knows to call the clinic with any problems, questions or concerns.   Wyvonnia Lora MD MS AAHIVMS [facility] Hematology/Oncology Physician [facility]  .*Total Encounter Time as defined by the Centers for Medicare and Medicaid Services includes, in addition to the face-to-face time of a patient visit (documented in the note above) non-face-to-face time: obtaining and reviewing outside history, ordering and reviewing medications, tests or procedures, care coordination (communications with other health care professionals or caregivers)  and documentation in the medical record.

## 2023-10-31 ENCOUNTER — Inpatient Hospital Stay: Payer: Medicare HMO | Attending: Hematology

## 2023-10-31 ENCOUNTER — Other Ambulatory Visit: Payer: Self-pay

## 2023-10-31 ENCOUNTER — Inpatient Hospital Stay: Payer: Medicare HMO

## 2023-10-31 VITALS — BP 132/74 | HR 60 | Temp 98.2°F | Resp 18 | Wt 170.6 lb

## 2023-10-31 DIAGNOSIS — Z79899 Other long term (current) drug therapy: Secondary | ICD-10-CM | POA: Insufficient documentation

## 2023-10-31 DIAGNOSIS — Z7189 Other specified counseling: Secondary | ICD-10-CM

## 2023-10-31 DIAGNOSIS — C9002 Multiple myeloma in relapse: Secondary | ICD-10-CM

## 2023-10-31 DIAGNOSIS — Z5112 Encounter for antineoplastic immunotherapy: Secondary | ICD-10-CM | POA: Diagnosis present

## 2023-10-31 DIAGNOSIS — C9000 Multiple myeloma not having achieved remission: Secondary | ICD-10-CM | POA: Insufficient documentation

## 2023-10-31 LAB — COMPREHENSIVE METABOLIC PANEL
ALT: 12 U/L (ref 0–44)
AST: 13 U/L — ABNORMAL LOW (ref 15–41)
Albumin: 3.7 g/dL (ref 3.5–5.0)
Alkaline Phosphatase: 64 U/L (ref 38–126)
Anion gap: 6 (ref 5–15)
BUN: 13 mg/dL (ref 8–23)
CO2: 25 mmol/L (ref 22–32)
Calcium: 8.1 mg/dL — ABNORMAL LOW (ref 8.9–10.3)
Chloride: 109 mmol/L (ref 98–111)
Creatinine, Ser: 1.35 mg/dL — ABNORMAL HIGH (ref 0.61–1.24)
GFR, Estimated: 54 mL/min — ABNORMAL LOW (ref >60)
Glucose, Bld: 97 mg/dL (ref 70–99)
Potassium: 3.4 mmol/L — ABNORMAL LOW (ref 3.5–5.1)
Sodium: 140 mmol/L (ref 135–145)
Total Bilirubin: 0.5 mg/dL (ref <1.2)
Total Protein: 6.1 g/dL — ABNORMAL LOW (ref 6.5–8.1)

## 2023-10-31 LAB — CBC WITH DIFFERENTIAL (CANCER CENTER ONLY)
Abs Immature Granulocytes: 0.01 10*3/uL (ref 0.00–0.07)
Basophils Absolute: 0.1 10*3/uL (ref 0.0–0.1)
Basophils Relative: 2 %
Eosinophils Absolute: 0.8 10*3/uL — ABNORMAL HIGH (ref 0.0–0.5)
Eosinophils Relative: 17 %
HCT: 33.6 % — ABNORMAL LOW (ref 39.0–52.0)
Hemoglobin: 10.6 g/dL — ABNORMAL LOW (ref 13.0–17.0)
Immature Granulocytes: 0 %
Lymphocytes Relative: 35 %
Lymphs Abs: 1.7 10*3/uL (ref 0.7–4.0)
MCH: 28.3 pg (ref 26.0–34.0)
MCHC: 31.5 g/dL (ref 30.0–36.0)
MCV: 89.8 fL (ref 80.0–100.0)
Monocytes Absolute: 0.4 10*3/uL (ref 0.1–1.0)
Monocytes Relative: 8 %
Neutro Abs: 1.8 10*3/uL (ref 1.7–7.7)
Neutrophils Relative %: 38 %
Platelet Count: 199 10*3/uL (ref 150–400)
RBC: 3.74 MIL/uL — ABNORMAL LOW (ref 4.22–5.81)
RDW: 15.9 % — ABNORMAL HIGH (ref 11.5–15.5)
WBC Count: 4.8 10*3/uL (ref 4.0–10.5)
nRBC: 0 % (ref 0.0–0.2)

## 2023-10-31 MED ORDER — LENALIDOMIDE 10 MG PO CAPS: 10.0000 mg | ORAL_CAPSULE | Freq: Every day | ORAL | 0 refills | Status: DC

## 2023-10-31 MED ORDER — DEXAMETHASONE 4 MG PO TABS
12.0000 mg | ORAL_TABLET | Freq: Once | ORAL | Status: AC
  Administered 2023-10-31: 12 mg via ORAL
  Filled 2023-10-31: qty 3

## 2023-10-31 MED ORDER — DENOSUMAB 120 MG/1.7ML ~~LOC~~ SOLN
120.0000 mg | Freq: Once | SUBCUTANEOUS | Status: AC
Start: 2023-10-31 — End: 2023-10-31
  Administered 2023-10-31: 120 mg via SUBCUTANEOUS

## 2023-10-31 MED ORDER — ACETAMINOPHEN 325 MG PO TABS
650.0000 mg | ORAL_TABLET | Freq: Once | ORAL | Status: AC
  Administered 2023-10-31: 650 mg via ORAL
  Filled 2023-10-31: qty 2

## 2023-10-31 MED ORDER — FAMOTIDINE 20 MG PO TABS
20.0000 mg | ORAL_TABLET | Freq: Once | ORAL | Status: AC
  Administered 2023-10-31: 20 mg via ORAL
  Filled 2023-10-31: qty 1

## 2023-10-31 MED ORDER — DARATUMUMAB-HYALURONIDASE-FIHJ 1800-30000 MG-UT/15ML ~~LOC~~ SOLN
1800.0000 mg | Freq: Once | SUBCUTANEOUS | Status: AC
  Administered 2023-10-31: 1800 mg via SUBCUTANEOUS
  Filled 2023-10-31: qty 15

## 2023-10-31 MED ORDER — DIPHENHYDRAMINE HCL 25 MG PO CAPS
50.0000 mg | ORAL_CAPSULE | Freq: Once | ORAL | Status: AC
  Administered 2023-10-31: 50 mg via ORAL
  Filled 2023-10-31: qty 2

## 2023-10-31 NOTE — Patient Instructions (Signed)
[facility] - A DEPT OF [facility]  Discharge Instructions: Thank you for choosing [facility] to provide your oncology and hematology care.   If you have a lab appointment with the Cancer Center, please go directly to the Cancer Center and check in at the registration area.   Wear comfortable clothing and clothing appropriate for easy access to any Portacath or PICC line.   We strive to give you quality time with your provider. You may need to reschedule your appointment if you arrive late (15 or more minutes).  Arriving late affects you and other patients whose appointments are after yours.  Also, if you miss three or more appointments without notifying the office, you may be dismissed from the clinic at the provider's discretion.      For prescription refill requests, have your pharmacy contact our office and allow 72 hours for refills to be completed.    Today you received the following chemotherapy and/or immunotherapy agents: Darzalex Faspro      To help prevent nausea and vomiting after your treatment, we encourage you to take your nausea medication as directed.  BELOW ARE SYMPTOMS THAT SHOULD BE REPORTED IMMEDIATELY: *FEVER GREATER THAN 100.4 F (38 C) OR HIGHER *CHILLS OR SWEATING *NAUSEA AND VOMITING THAT IS NOT CONTROLLED WITH YOUR NAUSEA MEDICATION *UNUSUAL SHORTNESS OF BREATH *UNUSUAL BRUISING OR BLEEDING *URINARY PROBLEMS (pain or burning when urinating, or frequent urination) *BOWEL PROBLEMS (unusual diarrhea, constipation, pain near the anus) TENDERNESS IN MOUTH AND THROAT WITH OR WITHOUT PRESENCE OF ULCERS (sore throat, sores in mouth, or a toothache) UNUSUAL RASH, SWELLING OR PAIN  UNUSUAL VAGINAL DISCHARGE OR ITCHING   Items with * indicate a potential emergency and should be followed up as soon as possible or go to the Emergency Department if any problems should occur.  Please show the CHEMOTHERAPY ALERT CARD or  IMMUNOTHERAPY ALERT CARD at check-in to the Emergency Department and triage nurse.  Should you have questions after your visit or need to cancel or reschedule your appointment, please contact [facility] - A DEPT OF [facility]  Dept: [phone]  and follow the prompts.  Office hours are 8:00 a.m. to 4:30 p.m. Monday - Friday. Please note that voicemails left after 4:00 p.m. may not be returned until the following business day.  We are closed weekends and major holidays. You have access to a nurse at all times for urgent questions. Please call the main number to the clinic Dept: [phone] and follow the prompts.   For any non-urgent questions, you may also contact your provider using MyChart. We now offer e-Visits for anyone 82 and older to request care online for non-urgent symptoms. For details visit [email or web address].   Also download the MyChart app! Go to the app store, search "MyChart", open the app, select [facility], and log in with your MyChart username and password.  Denosumab Injection (Oncology) What is this medication? DENOSUMAB (den oh SUE mab) prevents weakened bones caused by cancer. It may also be used to treat noncancerous bone tumors that cannot be removed by surgery. It can also be used to treat high calcium levels in the blood caused by cancer. It works by blocking a protein that causes bones to break down quickly. This slows down the release of calcium from bones, which lowers calcium levels in your blood. It also makes your bones stronger and less likely to break (fracture). This medicine may be  used for other purposes; ask your health care provider or pharmacist if you have questions. COMMON BRAND NAME(S): XGEVA What should I tell my care team before I take this medication? They need to know if you have any of these conditions: Dental disease Having surgery or tooth extraction Infection Kidney disease Low levels of calcium or  vitamin D in the blood Malnutrition On hemodialysis Skin conditions or sensitivity Thyroid or parathyroid disease An unusual reaction to denosumab, other medications, foods, dyes, or preservatives Pregnant or trying to get pregnant Breast-feeding How should I use this medication? This medication is for injection under the skin. It is given by your care team in a hospital or clinic setting. A special MedGuide will be given to you before each treatment. Be sure to read this information carefully each time. Talk to your care team about the use of this medication in children. While it may be prescribed for children as young as 13 years for selected conditions, precautions do apply. Overdosage: If you think you have taken too much of this medicine contact a poison control center or emergency room at once. NOTE: This medicine is only for you. Do not share this medicine with others. What if I miss a dose? Keep appointments for follow-up doses. It is important not to miss your dose. Call your care team if you are unable to keep an appointment. What may interact with this medication? Do not take this medication with any of the following: Other medications containing denosumab This medication may also interact with the following: Medications that lower your chance of fighting infection Steroid medications, such as prednisone or cortisone This list may not describe all possible interactions. Give your health care provider a list of all the medicines, herbs, non-prescription drugs, or dietary supplements you use. Also tell them if you smoke, drink alcohol, or use illegal drugs. Some items may interact with your medicine. What should I watch for while using this medication? Your condition will be monitored carefully while you are receiving this medication. You may need blood work while taking this medication. This medication may increase your risk of getting an infection. Call your care team for advice  if you get a fever, chills, sore throat, or other symptoms of a cold or flu. Do not treat yourself. Try to avoid being around people who are sick. You should make sure you get enough calcium and vitamin D while you are taking this medication, unless your care team tells you not to. Discuss the foods you eat and the vitamins you take with your care team. Some people who take this medication have severe bone, joint, or muscle pain. This medication may also increase your risk for jaw problems or a broken thigh bone. Tell your care team right away if you have severe pain in your jaw, bones, joints, or muscles. Tell your care team if you have any pain that does not go away or that gets worse. Talk to your care team if you may be pregnant. Serious birth defects can occur if you take this medication during pregnancy and for 5 months after the last dose. You will need a negative pregnancy test before starting this medication. Contraception is recommended while taking this medication and for 5 months after the last dose. Your care team can help you find the option that works for you. What side effects may I notice from receiving this medication? Side effects that you should report to your care team as soon as possible: Allergic reactions--skin  rash, itching, hives, swelling of the face, lips, tongue, or throat Bone, joint, or muscle pain Low calcium level--muscle pain or cramps, confusion, tingling, or numbness in the hands or feet Osteonecrosis of the jaw--pain, swelling, or redness in the mouth, numbness of the jaw, poor healing after dental work, unusual discharge from the mouth, visible bones in the mouth Side effects that usually do not require medical attention (report to your care team if they continue or are bothersome): Cough Diarrhea Fatigue Headache Nausea This list may not describe all possible side effects. Call your doctor for medical advice about side effects. You may report side effects to FDA  at 1-800-FDA-1088. Where should I keep my medication? This medication is given in a hospital or clinic. It will not be stored at home. NOTE: This sheet is a summary. It may not cover all possible information. If you have questions about this medicine, talk to your doctor, pharmacist, or health care provider.  2024 Elsevier/Gold Standard (2022-04-27 00:00:00)

## 2023-10-31 NOTE — Progress Notes (Signed)
Ok to give xgeva with a corrected calcium level of 8.34 today per Dr. Candise Che.

## 2023-11-01 LAB — KAPPA/LAMBDA LIGHT CHAINS
Kappa free light chain: 30.2 mg/L — ABNORMAL HIGH (ref 3.3–19.4)
Kappa, lambda light chain ratio: 1.1 (ref 0.26–1.65)
Lambda free light chains: 27.4 mg/L — ABNORMAL HIGH (ref 5.7–26.3)

## 2023-11-05 LAB — MULTIPLE MYELOMA PANEL, SERUM
Albumin SerPl Elph-Mcnc: 3.2 g/dL (ref 2.9–4.4)
Albumin/Glob SerPl: 1.3 (ref 0.7–1.7)
Alpha 1: 0.2 g/dL (ref 0.0–0.4)
Alpha2 Glob SerPl Elph-Mcnc: 0.7 g/dL (ref 0.4–1.0)
B-Globulin SerPl Elph-Mcnc: 0.8 g/dL (ref 0.7–1.3)
Gamma Glob SerPl Elph-Mcnc: 0.8 g/dL (ref 0.4–1.8)
Globulin, Total: 2.5 g/dL (ref 2.2–3.9)
IgA: 120 mg/dL (ref 61–437)
IgG (Immunoglobin G), Serum: 871 mg/dL (ref 603–1613)
IgM (Immunoglobulin M), Srm: 16 mg/dL (ref 15–143)
Total Protein ELP: 5.7 g/dL — ABNORMAL LOW (ref 6.0–8.5)

## 2023-11-06 DIAGNOSIS — R7989 Other specified abnormal findings of blood chemistry: Secondary | ICD-10-CM | POA: Diagnosis not present

## 2023-11-06 DIAGNOSIS — I129 Hypertensive chronic kidney disease with stage 1 through stage 4 chronic kidney disease, or unspecified chronic kidney disease: Secondary | ICD-10-CM | POA: Diagnosis not present

## 2023-11-06 DIAGNOSIS — N1831 Chronic kidney disease, stage 3a: Secondary | ICD-10-CM | POA: Diagnosis not present

## 2023-11-06 DIAGNOSIS — E782 Mixed hyperlipidemia: Secondary | ICD-10-CM | POA: Diagnosis not present

## 2023-11-06 DIAGNOSIS — D631 Anemia in chronic kidney disease: Secondary | ICD-10-CM | POA: Diagnosis not present

## 2023-11-06 DIAGNOSIS — R972 Elevated prostate specific antigen [PSA]: Secondary | ICD-10-CM | POA: Diagnosis not present

## 2023-11-06 DIAGNOSIS — Z1212 Encounter for screening for malignant neoplasm of rectum: Secondary | ICD-10-CM | POA: Diagnosis not present

## 2023-11-06 DIAGNOSIS — D649 Anemia, unspecified: Secondary | ICD-10-CM | POA: Diagnosis not present

## 2023-11-13 DIAGNOSIS — Z1331 Encounter for screening for depression: Secondary | ICD-10-CM | POA: Diagnosis not present

## 2023-11-13 DIAGNOSIS — C641 Malignant neoplasm of right kidney, except renal pelvis: Secondary | ICD-10-CM | POA: Diagnosis not present

## 2023-11-13 DIAGNOSIS — I129 Hypertensive chronic kidney disease with stage 1 through stage 4 chronic kidney disease, or unspecified chronic kidney disease: Secondary | ICD-10-CM | POA: Diagnosis not present

## 2023-11-13 DIAGNOSIS — N1831 Chronic kidney disease, stage 3a: Secondary | ICD-10-CM | POA: Diagnosis not present

## 2023-11-13 DIAGNOSIS — H6991 Unspecified Eustachian tube disorder, right ear: Secondary | ICD-10-CM | POA: Diagnosis not present

## 2023-11-13 DIAGNOSIS — Z0000 Encounter for general adult medical examination without abnormal findings: Secondary | ICD-10-CM | POA: Diagnosis not present

## 2023-11-13 DIAGNOSIS — M899 Disorder of bone, unspecified: Secondary | ICD-10-CM | POA: Diagnosis not present

## 2023-11-13 DIAGNOSIS — M84412D Pathological fracture, left shoulder, subsequent encounter for fracture with routine healing: Secondary | ICD-10-CM | POA: Diagnosis not present

## 2023-11-13 DIAGNOSIS — M1990 Unspecified osteoarthritis, unspecified site: Secondary | ICD-10-CM | POA: Diagnosis not present

## 2023-11-13 DIAGNOSIS — R82998 Other abnormal findings in urine: Secondary | ICD-10-CM | POA: Diagnosis not present

## 2023-11-13 DIAGNOSIS — C9000 Multiple myeloma not having achieved remission: Secondary | ICD-10-CM | POA: Diagnosis not present

## 2023-11-13 DIAGNOSIS — Z1339 Encounter for screening examination for other mental health and behavioral disorders: Secondary | ICD-10-CM | POA: Diagnosis not present

## 2023-11-13 DIAGNOSIS — E782 Mixed hyperlipidemia: Secondary | ICD-10-CM | POA: Diagnosis not present

## 2023-11-14 ENCOUNTER — Inpatient Hospital Stay: Payer: Medicare HMO

## 2023-11-14 ENCOUNTER — Inpatient Hospital Stay: Payer: Medicare HMO | Admitting: Hematology

## 2023-11-14 VITALS — BP 123/71 | HR 58 | Temp 98.2°F | Resp 18 | Wt 170.8 lb

## 2023-11-14 VITALS — BP 128/81 | HR 57 | Resp 18

## 2023-11-14 DIAGNOSIS — C9002 Multiple myeloma in relapse: Secondary | ICD-10-CM

## 2023-11-14 DIAGNOSIS — Z7189 Other specified counseling: Secondary | ICD-10-CM

## 2023-11-14 DIAGNOSIS — Z5112 Encounter for antineoplastic immunotherapy: Secondary | ICD-10-CM | POA: Diagnosis not present

## 2023-11-14 DIAGNOSIS — Z5111 Encounter for antineoplastic chemotherapy: Secondary | ICD-10-CM

## 2023-11-14 LAB — CBC WITH DIFFERENTIAL (CANCER CENTER ONLY)
Abs Immature Granulocytes: 0 10*3/uL (ref 0.00–0.07)
Basophils Absolute: 0.1 10*3/uL (ref 0.0–0.1)
Basophils Relative: 3 %
Eosinophils Absolute: 0.3 10*3/uL (ref 0.0–0.5)
Eosinophils Relative: 6 %
HCT: 34.5 % — ABNORMAL LOW (ref 39.0–52.0)
Hemoglobin: 11.2 g/dL — ABNORMAL LOW (ref 13.0–17.0)
Immature Granulocytes: 0 %
Lymphocytes Relative: 36 %
Lymphs Abs: 1.5 10*3/uL (ref 0.7–4.0)
MCH: 28.7 pg (ref 26.0–34.0)
MCHC: 32.5 g/dL (ref 30.0–36.0)
MCV: 88.5 fL (ref 80.0–100.0)
Monocytes Absolute: 0.2 10*3/uL (ref 0.1–1.0)
Monocytes Relative: 6 %
Neutro Abs: 2.1 10*3/uL (ref 1.7–7.7)
Neutrophils Relative %: 49 %
Platelet Count: 175 10*3/uL (ref 150–400)
RBC: 3.9 MIL/uL — ABNORMAL LOW (ref 4.22–5.81)
RDW: 16.3 % — ABNORMAL HIGH (ref 11.5–15.5)
WBC Count: 4.2 10*3/uL (ref 4.0–10.5)
nRBC: 0 % (ref 0.0–0.2)

## 2023-11-14 LAB — COMPREHENSIVE METABOLIC PANEL
ALT: 11 U/L (ref 0–44)
AST: 12 U/L — ABNORMAL LOW (ref 15–41)
Albumin: 3.6 g/dL (ref 3.5–5.0)
Alkaline Phosphatase: 57 U/L (ref 38–126)
Anion gap: 5 (ref 5–15)
BUN: 15 mg/dL (ref 8–23)
CO2: 26 mmol/L (ref 22–32)
Calcium: 8.3 mg/dL — ABNORMAL LOW (ref 8.9–10.3)
Chloride: 109 mmol/L (ref 98–111)
Creatinine, Ser: 1.62 mg/dL — ABNORMAL HIGH (ref 0.61–1.24)
GFR, Estimated: 43 mL/min — ABNORMAL LOW (ref >60)
Glucose, Bld: 96 mg/dL (ref 70–99)
Potassium: 3.7 mmol/L (ref 3.5–5.1)
Sodium: 140 mmol/L (ref 135–145)
Total Bilirubin: 0.4 mg/dL (ref <1.2)
Total Protein: 6.1 g/dL — ABNORMAL LOW (ref 6.5–8.1)

## 2023-11-14 MED ORDER — DARATUMUMAB-HYALURONIDASE-FIHJ 1800-30000 MG-UT/15ML ~~LOC~~ SOLN
1800.0000 mg | Freq: Once | SUBCUTANEOUS | Status: AC
  Administered 2023-11-14: 1800 mg via SUBCUTANEOUS
  Filled 2023-11-14: qty 15

## 2023-11-14 MED ORDER — ACETAMINOPHEN 325 MG PO TABS
650.0000 mg | ORAL_TABLET | Freq: Once | ORAL | Status: AC
  Administered 2023-11-14: 650 mg via ORAL
  Filled 2023-11-14: qty 2

## 2023-11-14 MED ORDER — DIPHENHYDRAMINE HCL 25 MG PO CAPS
50.0000 mg | ORAL_CAPSULE | Freq: Once | ORAL | Status: AC
Start: 2023-11-14 — End: 2023-11-14
  Administered 2023-11-14: 50 mg via ORAL
  Filled 2023-11-14: qty 2

## 2023-11-14 MED ORDER — FAMOTIDINE 20 MG PO TABS
20.0000 mg | ORAL_TABLET | Freq: Once | ORAL | Status: AC
Start: 2023-11-14 — End: 2023-11-14
  Administered 2023-11-14: 20 mg via ORAL
  Filled 2023-11-14: qty 1

## 2023-11-14 MED ORDER — DEXAMETHASONE 4 MG PO TABS
12.0000 mg | ORAL_TABLET | Freq: Once | ORAL | Status: AC
  Administered 2023-11-14: 12 mg via ORAL
  Filled 2023-11-14: qty 3

## 2023-11-14 NOTE — Patient Instructions (Signed)
 [facility] - A DEPT OF [facility]  Discharge Instructions: Thank you for choosing [facility] to provide your oncology and hematology care.   If you have a lab appointment with the Cancer Center, please go directly to the Cancer Center and check in at the registration area.   Wear comfortable clothing and clothing appropriate for easy access to any Portacath or PICC line.   We strive to give you quality time with your provider. You may need to reschedule your appointment if you arrive late (15 or more minutes).  Arriving late affects you and other patients whose appointments are after yours.  Also, if you miss three or more appointments without notifying the office, you may be dismissed from the clinic at the provider's discretion.      For prescription refill requests, have your pharmacy contact our office and allow 72 hours for refills to be completed.    Today you received the following chemotherapy and/or immunotherapy agents: Dara Faspro.       To help prevent nausea and vomiting after your treatment, we encourage you to take your nausea medication as directed.  BELOW ARE SYMPTOMS THAT SHOULD BE REPORTED IMMEDIATELY: *FEVER GREATER THAN 100.4 F (38 C) OR HIGHER *CHILLS OR SWEATING *NAUSEA AND VOMITING THAT IS NOT CONTROLLED WITH YOUR NAUSEA MEDICATION *UNUSUAL SHORTNESS OF BREATH *UNUSUAL BRUISING OR BLEEDING *URINARY PROBLEMS (pain or burning when urinating, or frequent urination) *BOWEL PROBLEMS (unusual diarrhea, constipation, pain near the anus) TENDERNESS IN MOUTH AND THROAT WITH OR WITHOUT PRESENCE OF ULCERS (sore throat, sores in mouth, or a toothache) UNUSUAL RASH, SWELLING OR PAIN  UNUSUAL VAGINAL DISCHARGE OR ITCHING   Items with * indicate a potential emergency and should be followed up as soon as possible or go to the Emergency Department if any problems should occur.  Please show the CHEMOTHERAPY ALERT CARD or  IMMUNOTHERAPY ALERT CARD at check-in to the Emergency Department and triage nurse.  Should you have questions after your visit or need to cancel or reschedule your appointment, please contact [facility] - A DEPT OF [facility]  Dept: [phone]  and follow the prompts.  Office hours are 8:00 a.m. to 4:30 p.m. Monday - Friday. Please note that voicemails left after 4:00 p.m. may not be returned until the following business day.  We are closed weekends and major holidays. You have access to a nurse at all times for urgent questions. Please call the main number to the clinic Dept: [phone] and follow the prompts.   For any non-urgent questions, you may also contact your provider using MyChart. We now offer e-Visits for anyone 33 and older to request care online for non-urgent symptoms. For details visit [email or web address].   Also download the MyChart app! Go to the app store, search "MyChart", open the app, select , and log in with your MyChart username and password.

## 2023-11-14 NOTE — Progress Notes (Signed)
Patient seen by Dr. Addison Naegeli are within treatment parameters.  Labs reviewed: and are not all within treatment parameters.    Dr Candise Che aware: CR: 1.62,   Per physician team, patient is ready for treatment and there are NO modifications to the treatment plan.

## 2023-11-14 NOTE — Progress Notes (Signed)
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  Patient was seen by me as an inpatient on 05/20/2023.  He noted that his vision may be slightly better with the steroid. Had some mild insomnia but it was not too bothersome. Has been seen by radiation oncology and is going to be set up for CT simulation and to start radiation 05/21/2023.  Did have a CT chest abdomen pelvis without contrast to evaluate for any other soft tissue metastatic disease and whole-body skeletal survey to determine other overt progression and to rule out the possibility of further source of metastatic disease.  Today, he is accompanied by two family members. He reports that he is feeling well overall. He reports that his vision has improved and returned to baseline. He denies any glares in his vision which were previously present. He is able to count fingers and notes improved sight of colors on television.   His p.o. intake is normal and he notes drinking 4 16oz bottles daily. He denies any fevers, chills, night sweats, new back pain, fatigue, posterior neck pain, or abdominal pain.  He reports some nausea with previously taking Revlimid. Patient reports that he has an upcoming dental appointment.  INTERVAL HISTORY:  Marcus Hatfield is a wonderful 77 y.o. male who is here for continued valuation and management of his multiple myeloma.  Patient was last seen by me on 10/17/2023 and he was doing well overall.   Patient is accompanied by his wife during this visit. He denies any new infection issues, fever, chills, night sweats, unexpected weight loss, SOB,  headaches, vision change, abdominal pain, bone pain, chest pain, back pain, or leg swelling.   He tolerated his previous treatment cycle well without any new or severe toxicities.   MEDICAL HISTORY:  Past Medical History:  Diagnosis Date   Anemia    Arthritis    CKD (chronic kidney disease) stage 3, GFR 30-59 ml/min (HCC) 20015   Creat 1.9   Hypertension    Multiple myeloma (HCC) 2016   [facility] heme [facility]   Prostate disorder 02/2017    SURGICAL HISTORY: Past Surgical History:  Procedure Laterality Date   BIOPSY  06/06/2023   Procedure: BIOPSY;  Surgeon: [name], MD;  Location: [facility] ENDOSCOPY;  Service: Gastroenterology;;   ESOPHAGOGASTRODUODENOSCOPY (EGD) WITH PROPOFOL N/A 06/06/2023   Procedure: ESOPHAGOGASTRODUODENOSCOPY (EGD) WITH PROPOFOL;  Surgeon: [name], MD;  Location: [facility] ENDOSCOPY;  Service: Gastroenterology;  Laterality: N/A;   EYE SURGERY Bilateral 01/30/2018   KNEE SURGERY     over ten years ago   NEPHRECTOMY Right 03/2015   ROBOTIC ASSITED PARTIAL NEPHRECTOMY Left 07/07/2021   Procedure: XI ROBOTIC ASSITED PARTIAL NEPHRECTOMY;  Surgeon: [name], MD;  Location: [facility] ORS;  Service: Urology;  Laterality: Left;  5 HRS   XI ROBOTIC ASSISTED SIMPLE PROSTATECTOMY N/A 07/07/2021   Procedure: XI ROBOTIC ASSISTED SIMPLE PROSTATECTOMY;  Surgeon: [name], MD;  Location: [facility] ORS;  Service: Urology;  Laterality: N/A;    SOCIAL HISTORY: Social History   Socioeconomic History   Marital status: Married    Spouse name: Museum/gallery curator   Number of children: 2   Years  of education: Not on file   Highest education level: Not on file  Occupational History   Occupation: Retired  Tobacco Use   Smoking status: Never   Smokeless tobacco: Never  Vaping Use   Vaping status: Never Used  Substance and Sexual Activity   Alcohol use: No   Drug use: No   Sexual activity: Not on file  Other Topics Concern   Not on file  Social History Narrative   Not on file    Social Determinants of Health   Financial Resource Strain: Not on file  Food Insecurity: Patient Declined (06/05/2023)   Hunger Vital Sign    Worried About Running Out of Food in the Last Year: Patient declined    Ran Out of Food in the Last Year: Patient declined  Transportation Needs: Patient Declined (06/05/2023)   PRAPARE - Administrator, Civil Service (Medical): Patient declined    Lack of Transportation (Non-Medical): Patient declined  Physical Activity: Not on file  Stress: Not on file  Social Connections: Not on file  Intimate Partner Violence: Patient Declined (06/05/2023)   Humiliation, Afraid, Rape, and Kick questionnaire    Fear of Current or Ex-Partner: Patient declined    Emotionally Abused: Patient declined    Physically Abused: Patient declined    Sexually Abused: Patient declined    FAMILY HISTORY: Family History  Problem Relation Age of Onset   Diabetes Father    Hypertension Brother     ALLERGIES:  is allergic to penicillins, latex, and tape.  MEDICATIONS:  Current Outpatient Medications  Medication Sig Dispense Refill   acyclovir (ZOVIRAX) 400 MG tablet Take 1 tablet (400 mg total) by mouth 2 (two) times daily. 60 tablet 11   amLODipine (NORVASC) 10 MG tablet Take 0.5 tablets (5 mg total) by mouth daily.     aspirin 81 MG chewable tablet Chew 81 mg by mouth daily. (Patient not taking: Reported on 08/31/2023)     carvedilol (COREG) 6.25 MG tablet Take 1 tablet (6.25 mg total) by mouth 2 (two) times daily with a meal.     diclofenac Sodium (VOLTAREN) 1 % GEL Apply 2 g topically daily as needed (for pain).     docusate sodium (COLACE) 100 MG capsule Take 1 capsule (100 mg total) by mouth daily as needed for mild constipation.     ferrous sulfate 325 (65 FE) MG tablet Take 1 tablet (325 mg total) by mouth 2 (two) times daily as needed (iron).     finasteride (PROSCAR) 5 MG tablet Take 5 mg by mouth daily.     fluticasone (FLONASE) 50 MCG/ACT nasal  spray Place 2 sprays into both nostrils daily as needed for allergies.     furosemide (LASIX) 20 MG tablet Take 0.5 tablets (10 mg total) by mouth every other day. 30 tablet    lactulose (CHRONULAC) 10 GM/15ML solution Take 10 g by mouth daily as needed for constipation.  5   lenalidomide (REVLIMID) 10 MG capsule Take 1 capsule (10 mg total) by mouth daily. Take for 21 days on, 7 days off, repeat every 28 days. 21 capsule 0   ondansetron (ZOFRAN) 8 MG tablet Take 1 tablet (8 mg total) by mouth every 8 (eight) hours as needed for nausea or vomiting. 30 tablet 1   oxyCODONE-acetaminophen (PERCOCET/ROXICET) 5-325 MG tablet Take 0.5-1 tablets by mouth every 6 (six) hours as needed for severe pain. 20 tablet 0   pantoprazole (PROTONIX) 40 MG tablet Take 1 tablet (40 mg  total) by mouth 2 (two) times daily. Take bid x 10 weeks, followed by daily thereafter 90 tablet 3   prochlorperazine (COMPAZINE) 10 MG tablet Take 1 tablet (10 mg total) by mouth every 6 (six) hours as needed for nausea or vomiting. 30 tablet 1   tamsulosin (FLOMAX) 0.4 MG CAPS capsule Take 1 capsule (0.4 mg total) by mouth daily after supper. 30 capsule 1   telmisartan (MICARDIS) 80 MG tablet Take 80 mg by mouth daily.     No current facility-administered medications for this visit.    REVIEW OF SYSTEMS:    10 Point review of Systems was done is negative except as noted above.  PHYSICAL EXAMINATION: ECOG PERFORMANCE STATUS: 2 - Symptomatic, <50% confined to bed .BP 123/71   Pulse (!) 58   Temp 98.2 F (36.8 C)   Resp 18   Wt 170 lb 12.8 oz (77.5 kg)   SpO2 100%   BMI 24.51 kg/m   GENERAL:alert, in no acute distress and comfortable SKIN: no acute rashes, no significant lesions EYES: conjunctiva are pink and non-injected, sclera anicteric OROPHARYNX: MMM, no exudates, no oropharyngeal erythema or ulceration NECK: supple, no JVD LYMPH:  no palpable lymphadenopathy in the cervical, axillary or inguinal regions LUNGS: clear  to auscultation b/l with normal respiratory effort HEART: regular rate & rhythm ABDOMEN:  normoactive bowel sounds , non tender, not distended. Extremity: no pedal edema PSYCH: alert & oriented x 3 with fluent speech NEURO: no focal motor/sensory deficits  LABORATORY DATA:  I have reviewed the data as listed .    Latest Ref Rng & Units 11/14/2023   11:47 AM 10/31/2023    1:07 PM 10/17/2023   10:37 AM  CBC  WBC 4.0 - 10.5 K/uL 4.2  4.8  5.4   Hemoglobin 13.0 - 17.0 g/dL 91.4  78.2  95.6   Hematocrit 39.0 - 52.0 % 34.5  33.6  35.0   Platelets 150 - 400 K/uL 175  199  233    .    Latest Ref Rng & Units 11/14/2023   11:47 AM 10/31/2023    1:07 PM 10/17/2023   10:37 AM  CMP  Glucose 70 - 99 mg/dL 96  97  87   BUN 8 - 23 mg/dL 15  13  11    Creatinine 0.61 - 1.24 mg/dL 2.13  0.86  5.78   Sodium 135 - 145 mmol/L 140  140  141   Potassium 3.5 - 5.1 mmol/L 3.7  3.4  3.3   Chloride 98 - 111 mmol/L 109  109  108   CO2 22 - 32 mmol/L 26  25  28    Calcium 8.9 - 10.3 mg/dL 8.3  8.1  8.1   Total Protein 6.5 - 8.1 g/dL 6.1  6.1  6.3   Total Bilirubin <1.2 mg/dL 0.4  0.5  0.5   Alkaline Phos 38 - 126 U/L 57  64  65   AST 15 - 41 U/L 12  13  13    ALT 0 - 44 U/L 11  12  11       RADIOGRAPHIC STUDIES: I have personally reviewed the radiological images as listed and agreed with the findings in the report. No results found.  ASSESSMENT & PLAN:   77year-old male with   #1 Progressive IgG kappa myeloma: Initially diagnosed in 2016 as stage II disease with standard risk cytogenetics today. 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 Status post treatment as noted above. Last  seen by Dr. Lalla Brothers at [facility] in January 2024 and noted to have progressive disease with increase in his IgG kappa M spike at 2.87 g/dL.   Recent light chains done on 05/20/2023 at [facility] showed increase in free kappa light chains to 603 with a kappa lambda ratio of 25 this is up  from kappa light chains of 402 with a kappa lambda ratio of 12.6 on 01/03/2023. CBC shows relatively stable hemoglobin of 11.3 with normal WBC count and platelets BMP shows stable chronic kidney disease with a creatinine of 1.87.  No overt hypercalcemia with a calcium of 9   #2 recent right clavicular pain with x-ray on 05/06/2023 showing mildly displaced likely pathologic fracture of the medial right clavicle.  Multiple other lytic lesions noted.   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  CT head done today 05/20/2023 shows  Large mass at the skull base, eroding and expanding the clivus, also involving the sphenoid bone and the petrous portions of both temporal bones. The mass broadly abuts both optic nerves between the chiasm and orbital apices. Mass also broadly involves both cavernous sinuses. 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 Recommend further assessment with brain MRI without and with contrast.   MRI brain 05/20/2023 1. 7.8 x 5.6 x 3.3 cm enhancing mass lesion at the skull base extending along the petrous ridge bilaterally, greater on the right. 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 There are some intrinsic T2 signal scratched at there is some intrinsic T2 signal and chordoma still considered. However, given the multiple other enhancing lesions throughout the skull, this most likely represents metastatic disease. 2. No acute intracranial abnormality. 3. Mild atrophy and white matter changes are likely within normal limits for age. 4. Right mastoid effusion secondary to obstruction of the eustachian tube. 5. Minimal right maxillary sinus disease.   ADDENDUM: Lytic lesions have been identified previously with this patient. 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   #4  hyponatremia   #5 history of renal cell carcinoma right nephrectomy on 02/03/2016.  He received cryotherapy to his left renal mass in April 2017. Surgically resected by Dr. [name] at [facility] on July 07, 2021. 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 Tumor is limited to the kidney (pT1a).   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 CBC shows slight decreased hemoglobin of 11.2 g/dL with hematocrit of 19.1%. CMP shows elevated creatinine of 1.62. low calcium level of 8.3, and low AST of 12.  -Discussed Multiple myeloma panel results from 10/31/2023. Did not observe M-Protein.  -Discussed the option of repeat Pet scan and repeat bone marrow biopsy after holidays. Patient agrees.  -Schedule patient for PET scan and bone marrow  biopsy in January.  -Continue Revlimid from today. 3 week on 1 week off. Start 81 mg baby aspirin as well.  -Discussed that the goal of treatment.  -Recommend influenza vaccine, COVID-19 Booster, RSV vaccine, and other age related vaccines.  -Continue Protonix and iron supplement.   -Recommend eating as much as possible.  -Patient has been tolerating his treatment well without any new  or severe toxicities. -Patient can proceed with treatment today.  -Continue iron supplement, twice every week.    . Orders Placed This Encounter  Procedures   NM PET Image Restage (PS) Whole Body    Standing Status:   Future    Standing Expiration Date:   11/19/2024    Order Specific Question:   If indicated for the ordered procedure, I authorize the administration of a radiopharmaceutical per Radiology protocol    Answer:   Yes    Order Specific Question:   Preferred imaging location?    Answer:   [facility]   CT BONE MARROW BIOPSY & ASPIRATION    Standing  Status:   Future    Standing Expiration Date:   11/19/2024    Order Specific Question:   Reason for Exam (SYMPTOM  OR DIAGNOSIS REQUIRED)    Answer:   unilateral bone marrow aspiration and biopsy to evaluate myeloma response to treatment    Order Specific Question:   Preferred location?    Answer:   [facility]   Comprehensive metabolic panel    Standing Status:   Future    Standing Expiration Date:   12/25/2024   Multiple Myeloma Panel (SPEP&IFE w/QIG)    Standing Status:   Future    Standing Expiration Date:   12/25/2024   Kappa/lambda light chains    Standing Status:   Future    Standing Expiration Date:   12/25/2024   CBC with Differential (Cancer Center Only)    Standing Status:   Future    Standing Expiration Date:   12/25/2024   Comprehensive metabolic panel    Standing Status:   Future    Standing Expiration Date:   01/22/2025   Multiple Myeloma Panel (SPEP&IFE w/QIG)    Standing Status:   Future    Standing Expiration Date:   01/22/2025   Kappa/lambda light chains    Standing Status:   Future    Standing Expiration Date:   01/22/2025   CBC with Differential (Cancer Center Only)    Standing Status:   Future    Standing Expiration Date:   01/22/2025    FOLLOW-UP: Continue daratumumab per integrated scheduling MD visit in 6 weeks with C8D1 of treatment Continue Xgeva every 4 weeks  PET/CT in 1st week of Jan 2025 CT bone marrow aspiration and biopsy 1st week of Jan 2025   The total time spent in the appointment was 32 minutes* .  All of the patient's questions were answered with apparent satisfaction. The patient knows to call the clinic with any problems, questions or concerns.   Wyvonnia Lora MD MS AAHIVMS [facility] Hematology/Oncology Physician [facility]  .*Total Encounter Time as defined by the Centers for Medicare and Medicaid Services includes, in addition to the face-to-face time of a patient visit (documented in the note above) non-face-to-face time:  obtaining and reviewing outside history, ordering and reviewing medications, tests or procedures, care coordination (communications with other health care professionals or caregivers) and documentation in the medical record.\   I,Param Shah,acting as a scribe for Wyvonnia Lora, MD.,have documented all relevant documentation on the behalf of Wyvonnia Lora, MD,as directed by  Wyvonnia Lora, MD while in the presence of Wyvonnia Lora, MD.   .I have reviewed the above documentation for accuracy and completeness, and I agree with the above. [name] MD

## 2023-11-20 ENCOUNTER — Encounter: Payer: Self-pay | Admitting: Hematology

## 2023-11-22 ENCOUNTER — Other Ambulatory Visit: Payer: Self-pay

## 2023-11-22 DIAGNOSIS — C9002 Multiple myeloma in relapse: Secondary | ICD-10-CM

## 2023-11-22 DIAGNOSIS — Z7189 Other specified counseling: Secondary | ICD-10-CM

## 2023-11-22 MED ORDER — LENALIDOMIDE 10 MG PO CAPS
10.0000 mg | ORAL_CAPSULE | Freq: Every day | ORAL | 0 refills | Status: DC
Start: 2023-11-22 — End: 2023-12-25

## 2023-11-28 ENCOUNTER — Encounter: Payer: Self-pay | Admitting: Hematology

## 2023-11-28 ENCOUNTER — Inpatient Hospital Stay: Payer: Medicare HMO | Attending: Hematology

## 2023-11-28 ENCOUNTER — Inpatient Hospital Stay: Payer: Medicare HMO | Admitting: Hematology

## 2023-11-28 ENCOUNTER — Inpatient Hospital Stay: Payer: Medicare HMO

## 2023-11-28 VITALS — BP 125/68 | HR 57 | Temp 98.3°F | Resp 16 | Wt 170.0 lb

## 2023-11-28 DIAGNOSIS — C9002 Multiple myeloma in relapse: Secondary | ICD-10-CM

## 2023-11-28 DIAGNOSIS — Z7189 Other specified counseling: Secondary | ICD-10-CM

## 2023-11-28 DIAGNOSIS — Z5112 Encounter for antineoplastic immunotherapy: Secondary | ICD-10-CM | POA: Insufficient documentation

## 2023-11-28 DIAGNOSIS — C9000 Multiple myeloma not having achieved remission: Secondary | ICD-10-CM | POA: Diagnosis not present

## 2023-11-28 LAB — CBC WITH DIFFERENTIAL (CANCER CENTER ONLY)
Abs Immature Granulocytes: 0.01 10*3/uL (ref 0.00–0.07)
Basophils Absolute: 0.1 10*3/uL (ref 0.0–0.1)
Basophils Relative: 2 %
Eosinophils Absolute: 0.5 10*3/uL (ref 0.0–0.5)
Eosinophils Relative: 14 %
HCT: 35.2 % — ABNORMAL LOW (ref 39.0–52.0)
Hemoglobin: 11.2 g/dL — ABNORMAL LOW (ref 13.0–17.0)
Immature Granulocytes: 0 %
Lymphocytes Relative: 37 %
Lymphs Abs: 1.5 10*3/uL (ref 0.7–4.0)
MCH: 28.4 pg (ref 26.0–34.0)
MCHC: 31.8 g/dL (ref 30.0–36.0)
MCV: 89.1 fL (ref 80.0–100.0)
Monocytes Absolute: 0.6 10*3/uL (ref 0.1–1.0)
Monocytes Relative: 14 %
Neutro Abs: 1.3 10*3/uL — ABNORMAL LOW (ref 1.7–7.7)
Neutrophils Relative %: 33 %
Platelet Count: 206 10*3/uL (ref 150–400)
RBC: 3.95 MIL/uL — ABNORMAL LOW (ref 4.22–5.81)
RDW: 16.4 % — ABNORMAL HIGH (ref 11.5–15.5)
WBC Count: 4 10*3/uL (ref 4.0–10.5)
nRBC: 0 % (ref 0.0–0.2)

## 2023-11-28 LAB — COMPREHENSIVE METABOLIC PANEL
ALT: 10 U/L (ref 0–44)
AST: 11 U/L — ABNORMAL LOW (ref 15–41)
Albumin: 3.6 g/dL (ref 3.5–5.0)
Alkaline Phosphatase: 58 U/L (ref 38–126)
Anion gap: 6 (ref 5–15)
BUN: 14 mg/dL (ref 8–23)
CO2: 26 mmol/L (ref 22–32)
Calcium: 7.9 mg/dL — ABNORMAL LOW (ref 8.9–10.3)
Chloride: 110 mmol/L (ref 98–111)
Creatinine, Ser: 1.36 mg/dL — ABNORMAL HIGH (ref 0.61–1.24)
GFR, Estimated: 54 mL/min — ABNORMAL LOW (ref >60)
Glucose, Bld: 97 mg/dL (ref 70–99)
Potassium: 3.6 mmol/L (ref 3.5–5.1)
Sodium: 142 mmol/L (ref 135–145)
Total Bilirubin: 0.4 mg/dL (ref <1.2)
Total Protein: 6.2 g/dL — ABNORMAL LOW (ref 6.5–8.1)

## 2023-11-28 MED ORDER — DEXAMETHASONE 4 MG PO TABS
12.0000 mg | ORAL_TABLET | Freq: Once | ORAL | Status: AC
  Administered 2023-11-28: 12 mg via ORAL
  Filled 2023-11-28: qty 3

## 2023-11-28 MED ORDER — FAMOTIDINE 20 MG PO TABS
20.0000 mg | ORAL_TABLET | Freq: Once | ORAL | Status: AC
  Administered 2023-11-28: 20 mg via ORAL
  Filled 2023-11-28: qty 1

## 2023-11-28 MED ORDER — DENOSUMAB 120 MG/1.7ML ~~LOC~~ SOLN
120.0000 mg | Freq: Once | SUBCUTANEOUS | Status: AC
  Administered 2023-11-28: 120 mg via SUBCUTANEOUS
  Filled 2023-11-28: qty 1.7

## 2023-11-28 MED ORDER — DARATUMUMAB-HYALURONIDASE-FIHJ 1800-30000 MG-UT/15ML ~~LOC~~ SOLN
1800.0000 mg | Freq: Once | SUBCUTANEOUS | Status: AC
  Administered 2023-11-28: 1800 mg via SUBCUTANEOUS
  Filled 2023-11-28: qty 15

## 2023-11-28 MED ORDER — DIPHENHYDRAMINE HCL 25 MG PO CAPS
50.0000 mg | ORAL_CAPSULE | Freq: Once | ORAL | Status: AC
Start: 2023-11-28 — End: 2023-11-28
  Administered 2023-11-28: 50 mg via ORAL
  Filled 2023-11-28: qty 2

## 2023-11-28 MED ORDER — ACETAMINOPHEN 325 MG PO TABS
650.0000 mg | ORAL_TABLET | Freq: Once | ORAL | Status: AC
  Administered 2023-11-28: 650 mg via ORAL
  Filled 2023-11-28: qty 2

## 2023-11-28 NOTE — Progress Notes (Signed)
Okay to proceed with Darzalex Faspro with ANC 1.3. Okay to proceed with Xgeva with calcium 7.9. (corrected calcium 8.22). Pt states he is taking calcium and vit D supplement at home.

## 2023-11-28 NOTE — Patient Instructions (Signed)
[facility] - A DEPT OF [facility]  Discharge Instructions: Thank you for choosing [facility] to provide your oncology and hematology care.   If you have a lab appointment with the Cancer Center, please go directly to the Cancer Center and check in at the registration area.   Wear comfortable clothing and clothing appropriate for easy access to any Portacath or PICC line.   We strive to give you quality time with your provider. You may need to reschedule your appointment if you arrive late (15 or more minutes).  Arriving late affects you and other patients whose appointments are after yours.  Also, if you miss three or more appointments without notifying the office, you may be dismissed from the clinic at the provider's discretion.      For prescription refill requests, have your pharmacy contact our office and allow 72 hours for refills to be completed.    Today you received the following chemotherapy and/or immunotherapy agents: Dara Faspro.       To help prevent nausea and vomiting after your treatment, we encourage you to take your nausea medication as directed.  BELOW ARE SYMPTOMS THAT SHOULD BE REPORTED IMMEDIATELY: *FEVER GREATER THAN 100.4 F (38 C) OR HIGHER *CHILLS OR SWEATING *NAUSEA AND VOMITING THAT IS NOT CONTROLLED WITH YOUR NAUSEA MEDICATION *UNUSUAL SHORTNESS OF BREATH *UNUSUAL BRUISING OR BLEEDING *URINARY PROBLEMS (pain or burning when urinating, or frequent urination) *BOWEL PROBLEMS (unusual diarrhea, constipation, pain near the anus) TENDERNESS IN MOUTH AND THROAT WITH OR WITHOUT PRESENCE OF ULCERS (sore throat, sores in mouth, or a toothache) UNUSUAL RASH, SWELLING OR PAIN  UNUSUAL VAGINAL DISCHARGE OR ITCHING   Items with * indicate a potential emergency and should be followed up as soon as possible or go to the Emergency Department if any problems should occur.  Please show the CHEMOTHERAPY ALERT CARD or  IMMUNOTHERAPY ALERT CARD at check-in to the Emergency Department and triage nurse.  Should you have questions after your visit or need to cancel or reschedule your appointment, please contact [facility]  Dept: [phone]  and follow the prompts.  Office hours are 8:00 a.m. to 4:30 p.m. Monday - Friday. Please note that voicemails left after 4:00 p.m. may not be returned until the following business day.  We are closed weekends and major holidays. You have access to a nurse at all times for urgent questions. Please call the main number to the clinic Dept: [phone] and follow the prompts.   For any non-urgent questions, you may also contact your provider using MyChart. We now offer e-Visits for anyone 21 and older to request care online for non-urgent symptoms. For details visit [email or web address].   Also download the MyChart app! Go to the app store, search "MyChart", open the app, select [place], and log in with your MyChart username and password.

## 2023-11-29 LAB — KAPPA/LAMBDA LIGHT CHAINS
Kappa free light chain: 33.3 mg/L — ABNORMAL HIGH (ref 3.3–19.4)
Kappa, lambda light chain ratio: 1.16 (ref 0.26–1.65)
Lambda free light chains: 28.6 mg/L — ABNORMAL HIGH (ref 5.7–26.3)

## 2023-12-04 LAB — MULTIPLE MYELOMA PANEL, SERUM
Albumin SerPl Elph-Mcnc: 3.4 g/dL (ref 2.9–4.4)
Albumin/Glob SerPl: 1.5 (ref 0.7–1.7)
Alpha 1: 0.2 g/dL (ref 0.0–0.4)
Alpha2 Glob SerPl Elph-Mcnc: 0.7 g/dL (ref 0.4–1.0)
B-Globulin SerPl Elph-Mcnc: 0.8 g/dL (ref 0.7–1.3)
Gamma Glob SerPl Elph-Mcnc: 0.8 g/dL (ref 0.4–1.8)
Globulin, Total: 2.4 g/dL (ref 2.2–3.9)
IgA: 142 mg/dL (ref 61–437)
IgG (Immunoglobin G), Serum: 885 mg/dL (ref 603–1613)
IgM (Immunoglobulin M), Srm: 13 mg/dL — ABNORMAL LOW (ref 15–143)
Total Protein ELP: 5.8 g/dL — ABNORMAL LOW (ref 6.0–8.5)

## 2023-12-08 ENCOUNTER — Other Ambulatory Visit: Payer: Self-pay

## 2023-12-08 DIAGNOSIS — C9002 Multiple myeloma in relapse: Secondary | ICD-10-CM

## 2023-12-08 MED ORDER — OXYCODONE-ACETAMINOPHEN 5-325 MG PO TABS: 0.5000 | ORAL_TABLET | Freq: Four times a day (QID) | ORAL | 0 refills | Status: AC | PRN

## 2023-12-21 ENCOUNTER — Other Ambulatory Visit: Payer: Self-pay | Admitting: Radiology

## 2023-12-21 DIAGNOSIS — C9002 Multiple myeloma in relapse: Secondary | ICD-10-CM

## 2023-12-22 ENCOUNTER — Ambulatory Visit (HOSPITAL_COMMUNITY)
Admission: RE | Admit: 2023-12-22 | Discharge: 2023-12-22 | Disposition: A | Discharge status: Home / Self Care | Payer: Medicare HMO | Source: Ambulatory Visit | Attending: Hematology | Admitting: Hematology

## 2023-12-22 DIAGNOSIS — C9000 Multiple myeloma not having achieved remission: Secondary | ICD-10-CM | POA: Diagnosis not present

## 2023-12-22 DIAGNOSIS — C9002 Multiple myeloma in relapse: Secondary | ICD-10-CM | POA: Diagnosis not present

## 2023-12-22 LAB — GLUCOSE, CAPILLARY: Glucose-Capillary: 88 mg/dL (ref 70–99)

## 2023-12-22 MED ORDER — FLUDEOXYGLUCOSE F - 18 (FDG) INJECTION
8.5000 | Freq: Once | INTRAVENOUS | Status: AC
  Administered 2023-12-22: 8.47 via INTRAVENOUS

## 2023-12-22 NOTE — Consult Note (Signed)
 Chief Complaint: Patient was seen in consultation today for CT-guided bone marrow biopsy  Referring Physician(s): Onesimo Emaline Brink  Supervising Physician: Hughes Simmonds  Patient Status: [facility] - Out-pt  Full Code status per patient and chart review.  History of Present Illness: Marcus Hatfield is a 78 y.o. male with past medical history significant for anemia, arthritis, chronic kidney disease, hypertension,  progressive IgG kappa myeloma initially diagnosed in 2016, AL amyloidosis, base of skull plasmacytoma, and right renal cell carcinoma with prior partial nephrectomy 2022.  He is scheduled today for CT-guided bone marrow biopsy to evaluate myeloma response to treatment.  All labs and medications are within acceptable parameters. No pertinent allergies. Patient has been NPO since midnight.    Currently without any significant complaints. Patient alert and laying in bed,calm. Denies any fevers, headache, chest pain, SOB, cough, abdominal pain, nausea, vomiting or bleeding.     Past Medical History:  Diagnosis Date   Anemia    Arthritis    CKD (chronic kidney disease) stage 3, GFR 30-59 ml/min (HCC) 20015   Creat 1.9   Hypertension    Multiple myeloma (HCC) 2016   [facility]   Prostate disorder 02/2017    Past Surgical History:  Procedure Laterality Date   BIOPSY  06/06/2023   Procedure: BIOPSY;  Surgeon: Federico Rosario BROCKS, MD;  Location: [facility] ENDOSCOPY;  Service: Gastroenterology;;   ESOPHAGOGASTRODUODENOSCOPY (EGD) WITH PROPOFOL  N/A 06/06/2023   Procedure: ESOPHAGOGASTRODUODENOSCOPY (EGD) WITH PROPOFOL ;  Surgeon: Federico Rosario BROCKS, MD;  Location: [facility] ENDOSCOPY;  Service: Gastroenterology;  Laterality: N/A;   EYE SURGERY Bilateral 01/30/2018   KNEE SURGERY     over ten years ago   NEPHRECTOMY Right 03/2015   ROBOTIC ASSITED PARTIAL NEPHRECTOMY Left 07/07/2021   Procedure: XI ROBOTIC ASSITED PARTIAL NEPHRECTOMY;  Surgeon: Alvaro Hummer, MD;  Location: [facility] ORS;  Service:  Urology;  Laterality: Left;  5 HRS   XI ROBOTIC ASSISTED SIMPLE PROSTATECTOMY N/A 07/07/2021   Procedure: XI ROBOTIC ASSISTED SIMPLE PROSTATECTOMY;  Surgeon: Alvaro Hummer, MD;  Location: [facility] ORS;  Service: Urology;  Laterality: N/A;    Allergies: Penicillins, Latex, and Tape  Medications: Prior to Admission medications   Medication Sig Start Date End Date Taking? Authorizing Provider  acyclovir  (ZOVIRAX ) 400 MG tablet Take 1 tablet (400 mg total) by mouth 2 (two) times daily. 05/24/23   Onesimo Emaline Brink, MD  amLODipine  (NORVASC ) 10 MG tablet Take 0.5 tablets (5 mg total) by mouth daily. 06/08/23   Ghimire, Donalda HERO, MD  aspirin  81 MG chewable tablet Chew 81 mg by mouth daily.    [provider]  carvedilol  (COREG ) 6.25 MG tablet Take 1 tablet (6.25 mg total) by mouth 2 (two) times daily with a meal. 06/07/23   Ghimire, Donalda HERO, MD  diclofenac Sodium (VOLTAREN) 1 % GEL Apply 2 g topically daily as needed (for pain). 05/16/23   [provider]  docusate sodium  (COLACE) 100 MG capsule Take 1 capsule (100 mg total) by mouth daily as needed for mild constipation. 05/23/23   Cheryle Page, MD  ferrous sulfate  325 (65 FE) MG tablet Take 1 tablet (325 mg total) by mouth 2 (two) times daily as needed (iron). 05/23/23 11/14/23  Cheryle Page, MD  finasteride  (PROSCAR ) 5 MG tablet Take 5 mg by mouth daily.    [provider]  fluticasone  (FLONASE ) 50 MCG/ACT nasal spray Place 2 sprays into both nostrils daily as needed for allergies. 02/19/21   [provider]  furosemide  (LASIX ) 20  MG tablet Take 0.5 tablets (10 mg total) by mouth every other day. 06/19/23   Onesimo Emaline Brink, MD  lactulose  (CHRONULAC ) 10 GM/15ML solution Take 10 g by mouth daily as needed for constipation. 12/07/16   [provider]  lenalidomide  (REVLIMID ) 10 MG capsule Take 1 capsule (10 mg total) by mouth daily. Take for 21 days on, 7 days off, repeat every 28 days. 11/22/23   Onesimo Emaline Brink, MD  ondansetron  (ZOFRAN ) 8 MG tablet Take 1 tablet (8 mg total) by mouth every 8 (eight) hours as needed for nausea or vomiting. 05/24/23   Onesimo Emaline Brink, MD  oxyCODONE -acetaminophen  (PERCOCET/ROXICET) 5-325 MG tablet Take 0.5-1 tablets by mouth every 6 (six) hours as needed for severe pain (pain score 7-10). 12/08/23   Onesimo Emaline Brink, MD  pantoprazole  (PROTONIX ) 40 MG tablet Take 1 tablet (40 mg total) by mouth 2 (two) times daily. Take bid x 10 weeks, followed by daily thereafter 06/06/23 06/05/24  Raenelle Donalda HERO, MD  prochlorperazine  (COMPAZINE ) 10 MG tablet Take 1 tablet (10 mg total) by mouth every 6 (six) hours as needed for nausea or vomiting. 05/24/23   Onesimo Emaline Brink, MD  tamsulosin  (FLOMAX ) 0.4 MG CAPS capsule Take 1 capsule (0.4 mg total) by mouth daily after supper. 06/07/23   Ghimire, Donalda HERO, MD  telmisartan (MICARDIS) 80 MG tablet Take 80 mg by mouth daily. 08/24/23   [provider]     Family History  Problem Relation Age of Onset   Diabetes Father    Hypertension Brother     Social History   Socioeconomic History   Marital status: Married    Spouse name: Museum/gallery Curator   Number of children: 2   Years of education: Not on file   Highest education level: Not on file  Occupational History   Occupation: Retired  Tobacco Use   Smoking status: Never   Smokeless tobacco: Never  Vaping Use   Vaping status: Never Used  Substance and Sexual Activity   Alcohol  use: No   Drug use: No   Sexual activity: Not on file  Other Topics Concern   Not on file  Social History Narrative   Not on file   Social Drivers of Health   Financial Resource Strain: Not on file  Food Insecurity: Patient Declined (06/05/2023)   Hunger Vital Sign    Worried About Running Out of Food in the Last Year: Patient declined    Ran Out of Food in the Last Year: Patient declined  Transportation Needs: Patient Declined (06/05/2023)   PRAPARE - Therapist, Art (Medical): Patient declined    Lack of Transportation (Non-Medical): Patient declined  Physical Activity: Not on file  Stress: Not on file  Social Connections: Not on file     Review of Systems  Constitutional:  Negative for activity change, chills, fatigue, fever and unexpected weight change.  Respiratory:  Negative for cough, chest tightness, shortness of breath and wheezing.   Cardiovascular:  Negative for chest pain.  Gastrointestinal:  Negative for abdominal pain.  Skin:  Negative for color change.  Psychiatric/Behavioral:  Negative for behavioral problems and confusion.     Vital Signs: Temp 98.6 F, BP 113/74, Pulse 59, Resp 15, O2 100%.  Code Status:   Advance Care Plan: No documents on file   Physical Exam Vitals reviewed.  Constitutional:      General: He is not in acute distress.    Appearance: Normal appearance.  HENT:     Mouth/Throat:     Mouth: Mucous membranes are moist.  Cardiovascular:     Rate and Rhythm: Normal rate and regular rhythm.     Pulses: Normal pulses.     Heart sounds: No murmur heard. Pulmonary:     Effort: Pulmonary effort is normal. No respiratory distress.     Breath sounds: Normal breath sounds.  Abdominal:     General: Abdomen is flat.     Tenderness: There is no abdominal tenderness.  Musculoskeletal:        General: Normal range of motion.  Skin:    General: Skin is warm and dry.  Neurological:     Mental Status: He is alert and oriented to person, place, and time.  Psychiatric:        Mood and Affect: Mood normal.        Behavior: Behavior normal.        Thought Content: Thought content normal.        Judgment: Judgment normal.     Imaging: No results found.  Labs:  CBC: Recent Labs    10/31/23 1307 11/14/23 1147 11/28/23 1223 12/25/23 0730  WBC 4.8 4.2 4.0 3.5*  HGB 10.6* 11.2* 11.2* 11.5*  HCT 33.6* 34.5* 35.2* 35.5*  PLT 199 175 206 151    COAGS: Recent Labs    06/05/23 0827   INR 1.3*  APTT 21*    BMP: Recent Labs    10/17/23 1037 10/31/23 1307 11/14/23 1147 11/28/23 1223  NA 141 140 140 142  K 3.3* 3.4* 3.7 3.6  CL 108 109 109 110  CO2 28 25 26 26   GLUCOSE 87 97 96 97  BUN 11 13 15 14   CALCIUM  8.1* 8.1* 8.3* 7.9*  CREATININE 1.28* 1.35* 1.62* 1.36*  GFRNONAA 58* 54* 43* 54*    LIVER FUNCTION TESTS: Recent Labs    10/17/23 1037 10/31/23 1307 11/14/23 1147 11/28/23 1223  BILITOT 0.5 0.5 0.4 0.4  AST 13* 13* 12* 11*  ALT 11 12 11 10   ALKPHOS 65 64 57 58  PROT 6.3* 6.1* 6.1* 6.2*  ALBUMIN  3.8 3.7 3.6 3.6    TUMOR MARKERS: No results for input(s): AFPTM, CEA, CA199, CHROMGRNA in the last 8760 hours.  Assessment and Plan: 78 y.o. male with past medical history significant for anemia, arthritis, chronic kidney disease, hypertension,  progressive IgG kappa myeloma initially diagnosed in 2016, AL amyloidosis, base of skull plasmacytoma, and right renal cell carcinoma with prior partial nephrectomy 2022.  He is scheduled today for CT-guided bone marrow biopsy to evaluate myeloma response to treatment.Risks and benefits of procedure was discussed with the patient and/or patient's family including, but not limited to bleeding, infection, damage to adjacent structures or low yield requiring additional tests.  All of the questions were answered and there is agreement to proceed.  Consent signed and in chart.    Thank you for this interesting consult.  I greatly enjoyed meeting [address] and look forward to participating in their care.  A copy of this report was sent to the requesting provider on this date.  Electronically Signed: Carlin DELENA Griffon, PA-C 12/25/2023, 8:04 AM   I spent a total of  20 minutes   in face to face in clinical consultation, greater than 50% of which was counseling/coordinating care for CT-guided bone marrow biopsy

## 2023-12-25 ENCOUNTER — Other Ambulatory Visit: Payer: Self-pay

## 2023-12-25 ENCOUNTER — Ambulatory Visit (HOSPITAL_COMMUNITY)
Admission: RE | Admit: 2023-12-25 | Discharge: 2023-12-25 | Disposition: A | Discharge status: Home / Self Care | Payer: Medicare HMO | Source: Ambulatory Visit | Attending: Hematology | Admitting: Hematology

## 2023-12-25 ENCOUNTER — Encounter (HOSPITAL_COMMUNITY): Payer: Self-pay

## 2023-12-25 DIAGNOSIS — C9002 Multiple myeloma in relapse: Secondary | ICD-10-CM | POA: Diagnosis not present

## 2023-12-25 DIAGNOSIS — Z905 Acquired absence of kidney: Secondary | ICD-10-CM | POA: Insufficient documentation

## 2023-12-25 DIAGNOSIS — I129 Hypertensive chronic kidney disease with stage 1 through stage 4 chronic kidney disease, or unspecified chronic kidney disease: Secondary | ICD-10-CM | POA: Insufficient documentation

## 2023-12-25 DIAGNOSIS — M1990 Unspecified osteoarthritis, unspecified site: Secondary | ICD-10-CM | POA: Diagnosis not present

## 2023-12-25 DIAGNOSIS — D631 Anemia in chronic kidney disease: Secondary | ICD-10-CM | POA: Diagnosis not present

## 2023-12-25 DIAGNOSIS — D649 Anemia, unspecified: Secondary | ICD-10-CM | POA: Diagnosis not present

## 2023-12-25 DIAGNOSIS — N183 Chronic kidney disease, stage 3 unspecified: Secondary | ICD-10-CM | POA: Diagnosis not present

## 2023-12-25 DIAGNOSIS — D704 Cyclic neutropenia: Secondary | ICD-10-CM | POA: Diagnosis not present

## 2023-12-25 DIAGNOSIS — D72819 Decreased white blood cell count, unspecified: Secondary | ICD-10-CM | POA: Diagnosis not present

## 2023-12-25 DIAGNOSIS — D759 Disease of blood and blood-forming organs, unspecified: Secondary | ICD-10-CM | POA: Diagnosis not present

## 2023-12-25 DIAGNOSIS — Z85528 Personal history of other malignant neoplasm of kidney: Secondary | ICD-10-CM | POA: Insufficient documentation

## 2023-12-25 DIAGNOSIS — Z01812 Encounter for preprocedural laboratory examination: Secondary | ICD-10-CM | POA: Insufficient documentation

## 2023-12-25 LAB — CBC WITH DIFFERENTIAL/PLATELET
Abs Immature Granulocytes: 0.02 10*3/uL (ref 0.00–0.07)
Basophils Absolute: 0.1 10*3/uL (ref 0.0–0.1)
Basophils Relative: 2 %
Eosinophils Absolute: 0.5 10*3/uL (ref 0.0–0.5)
Eosinophils Relative: 14 %
HCT: 35.5 % — ABNORMAL LOW (ref 39.0–52.0)
Hemoglobin: 11.5 g/dL — ABNORMAL LOW (ref 13.0–17.0)
Immature Granulocytes: 1 %
Lymphocytes Relative: 38 %
Lymphs Abs: 1.4 10*3/uL (ref 0.7–4.0)
MCH: 28.9 pg (ref 26.0–34.0)
MCHC: 32.4 g/dL (ref 30.0–36.0)
MCV: 89.2 fL (ref 80.0–100.0)
Monocytes Absolute: 0.5 10*3/uL (ref 0.1–1.0)
Monocytes Relative: 14 %
Neutro Abs: 1.1 10*3/uL — ABNORMAL LOW (ref 1.7–7.7)
Neutrophils Relative %: 31 %
Platelets: 151 10*3/uL (ref 150–400)
RBC: 3.98 MIL/uL — ABNORMAL LOW (ref 4.22–5.81)
RDW: 16.9 % — ABNORMAL HIGH (ref 11.5–15.5)
WBC: 3.5 10*3/uL — ABNORMAL LOW (ref 4.0–10.5)
nRBC: 0 % (ref 0.0–0.2)

## 2023-12-25 MED ORDER — MIDAZOLAM HCL 2 MG/2ML IJ SOLN
INTRAMUSCULAR | Status: AC
Start: 2023-12-25 — End: ?
  Filled 2023-12-25: qty 2

## 2023-12-25 MED ORDER — FLUMAZENIL 0.5 MG/5ML IV SOLN
INTRAVENOUS | Status: AC
  Filled 2023-12-25: qty 5

## 2023-12-25 MED ORDER — FENTANYL CITRATE (PF) 100 MCG/2ML IJ SOLN
INTRAMUSCULAR | Status: AC
  Filled 2023-12-25: qty 2

## 2023-12-25 MED ORDER — FENTANYL CITRATE (PF) 100 MCG/2ML IJ SOLN
INTRAMUSCULAR | Status: AC | PRN
  Administered 2023-12-25: 50 ug via INTRAVENOUS

## 2023-12-25 MED ORDER — NALOXONE HCL 0.4 MG/ML IJ SOLN
INTRAMUSCULAR | Status: AC
  Filled 2023-12-25: qty 1

## 2023-12-25 MED ORDER — MIDAZOLAM HCL 2 MG/2ML IJ SOLN
INTRAMUSCULAR | Status: AC | PRN
  Administered 2023-12-25: 1 mg via INTRAVENOUS

## 2023-12-25 MED ORDER — LENALIDOMIDE 10 MG PO CAPS: 10.0000 mg | ORAL_CAPSULE | Freq: Every day | ORAL | 0 refills | Status: DC

## 2023-12-25 MED ORDER — LIDOCAINE HCL 1 % IJ SOLN
INTRAMUSCULAR | Status: AC | PRN
  Administered 2023-12-25: 10 mL via INTRADERMAL

## 2023-12-25 MED ORDER — SODIUM CHLORIDE 0.9 % IV SOLN: INTRAVENOUS | Status: DC

## 2023-12-25 NOTE — Progress Notes (Signed)
 1030 Ice bag given to use to low back for comfort as needed.

## 2023-12-25 NOTE — Procedures (Signed)
 Vascular and Interventional Radiology Procedure Note  Patient: Marcus Hatfield DOB: Sep 11, 1946 Medical Record Number: [id number] Note Date/Time: 12/25/23 9:24 AM   Performing Physician: Thom Hall, MD Assistant(s): None  Diagnosis: Myeloma  Procedure: BONE MARROW ASPIRATION and BIOPSY  Anesthesia: Conscious Sedation Complications: None Estimated Blood Loss: Minimal Specimens: Sent for Pathology  Findings:  Successful CT-guided bone marrow aspiration and biopsy A total of 1 cores were obtained. Hemostasis of the tract was achieved using Manual Pressure.  Plan: Bed rest for 1 hours.  See detailed procedure note with images in PACS. The patient tolerated the procedure well without incident or complication and was returned to Recovery in stable condition.    Thom Hall, MD [facility]iology [facility] Radiology   Pager. [phone] Clinic. [phone]

## 2023-12-25 NOTE — Discharge Instructions (Addendum)
Discharge Instructions:   Please call Interventional Radiology clinic [phone] with any questions or concerns.  You may remove your dressing and shower tomorrow.    Bone Marrow Aspiration and Bone Marrow Biopsy, Adult, Care After This sheet gives you information about how to care for yourself after your procedure. Your health care provider may also give you more specific instructions. If you have problems or questions, contact your health care provider. What can I expect after the procedure? After the procedure, it is common to have: Mild pain and tenderness. Swelling. Bruising. Follow these instructions at home: Puncture site care  Follow instructions from your health care provider about how to take care of the puncture site. Make sure you: Wash your hands with soap and water before and after you change your bandage (dressing). If soap and water are not available, use hand sanitizer. Change your dressing as told by your health care provider. Check your puncture site every day for signs of infection. Check for: More redness, swelling, or pain. Fluid or blood. Warmth. Pus or a bad smell. Activity Return to your normal activities as told by your health care provider. Ask your health care provider what activities are safe for you. Do not lift anything that is heavier than 10 lb (4.5 kg), or the limit that you are told, until your health care provider says that it is safe. Do not drive for 24 hours if you were given a sedative during your procedure. General instructions  Take over-the-counter and prescription medicines only as told by your health care provider. Do not take baths, swim, or use a hot tub until your health care provider approves. Ask your health care provider if you may take showers. You may only be allowed to take sponge baths. If directed, put ice on the affected area. To do this: Put ice in a plastic bag. Place a towel between your skin and the bag. Leave the ice  on for 20 minutes, 2-3 times a day. Keep all follow-up visits as told by your health care provider. This is important. Contact a health care provider if: Your pain is not controlled with medicine. You have a fever. You have more redness, swelling, or pain around the puncture site. You have fluid or blood coming from the puncture site. Your puncture site feels warm to the touch. You have pus or a bad smell coming from the puncture site. Summary After the procedure, it is common to have mild pain, tenderness, swelling, and bruising. Follow instructions from your health care provider about how to take care of the puncture site and what activities are safe for you. Take over-the-counter and prescription medicines only as told by your health care provider. Contact a health care provider if you have any signs of infection, such as fluid or blood coming from the puncture site. This information is not intended to replace advice given to you by your health care provider. Make sure you discuss any questions you have with your health care provider. Document Revised: 04/23/2019 Document Reviewed: 04/23/2019 Elsevier Patient Education  2023 Elsevier Inc.   Moderate Conscious Sedation, Adult, Care After This sheet gives you information about how to care for yourself after your procedure. Your health care provider may also give you more specific instructions. If you have problems or questions, contact your health care provider. What can I expect after the procedure? After the procedure, it is common to have: Sleepiness for several hours. Impaired judgment for several hours. Difficulty with balance. Vomiting if   you eat too soon. Follow these instructions at home: For the time period you were told by your health care provider: Rest. Do not participate in activities where you could fall or become injured. Do not drive or use machinery. Do not drink alcohol. Do not take sleeping pills or medicines that  cause drowsiness. Do not make important decisions or sign legal documents. Do not take care of children on your own. Eating and drinking  Follow the diet recommended by your health care provider. Drink enough fluid to keep your urine pale yellow. If you vomit: Drink water, juice, or soup when you can drink without vomiting. Make sure you have little or no nausea before eating solid foods. General instructions Take over-the-counter and prescription medicines only as told by your health care provider. Have a responsible adult stay with you for the time you are told. It is important to have someone help care for you until you are awake and alert. Do not smoke. Keep all follow-up visits as told by your health care provider. This is important. Contact a health care provider if: You are still sleepy or having trouble with balance after 24 hours. You feel light-headed. You keep feeling nauseous or you keep vomiting. You develop a rash. You have a fever. You have redness or swelling around the IV site. Get help right away if: You have trouble breathing. You have new-onset confusion at home. Summary After the procedure, it is common to feel sleepy, have impaired judgment, or feel nauseous if you eat too soon. Rest after you get home. Know the things you should not do after the procedure. Follow the diet recommended by your health care provider and drink enough fluid to keep your urine pale yellow. Get help right away if you have trouble breathing or new-onset confusion at home. This information is not intended to replace advice given to you by your health care provider. Make sure you discuss any questions you have with your health care provider. Document Revised: 04/03/2020 Document Reviewed: 10/31/2019 Elsevier Patient Education  2023 Elsevier Inc.  

## 2023-12-26 ENCOUNTER — Inpatient Hospital Stay: Payer: Medicare HMO | Attending: Hematology

## 2023-12-26 ENCOUNTER — Inpatient Hospital Stay (HOSPITAL_BASED_OUTPATIENT_CLINIC_OR_DEPARTMENT_OTHER): Payer: Medicare HMO | Admitting: Hematology

## 2023-12-26 ENCOUNTER — Inpatient Hospital Stay: Payer: Medicare HMO

## 2023-12-26 VITALS — BP 137/78 | HR 67 | Temp 97.7°F | Resp 16 | Wt 170.9 lb

## 2023-12-26 DIAGNOSIS — K8020 Calculus of gallbladder without cholecystitis without obstruction: Secondary | ICD-10-CM | POA: Insufficient documentation

## 2023-12-26 DIAGNOSIS — Z7189 Other specified counseling: Secondary | ICD-10-CM

## 2023-12-26 DIAGNOSIS — Z7961 Long term (current) use of immunomodulator: Secondary | ICD-10-CM | POA: Diagnosis not present

## 2023-12-26 DIAGNOSIS — N183 Chronic kidney disease, stage 3 unspecified: Secondary | ICD-10-CM | POA: Diagnosis not present

## 2023-12-26 DIAGNOSIS — M129 Arthropathy, unspecified: Secondary | ICD-10-CM | POA: Insufficient documentation

## 2023-12-26 DIAGNOSIS — N281 Cyst of kidney, acquired: Secondary | ICD-10-CM | POA: Diagnosis not present

## 2023-12-26 DIAGNOSIS — J320 Chronic maxillary sinusitis: Secondary | ICD-10-CM | POA: Diagnosis not present

## 2023-12-26 DIAGNOSIS — Z85528 Personal history of other malignant neoplasm of kidney: Secondary | ICD-10-CM | POA: Insufficient documentation

## 2023-12-26 DIAGNOSIS — D649 Anemia, unspecified: Secondary | ICD-10-CM | POA: Diagnosis not present

## 2023-12-26 DIAGNOSIS — C9002 Multiple myeloma in relapse: Secondary | ICD-10-CM

## 2023-12-26 DIAGNOSIS — C9000 Multiple myeloma not having achieved remission: Secondary | ICD-10-CM | POA: Diagnosis not present

## 2023-12-26 DIAGNOSIS — G4700 Insomnia, unspecified: Secondary | ICD-10-CM | POA: Diagnosis not present

## 2023-12-26 DIAGNOSIS — K439 Ventral hernia without obstruction or gangrene: Secondary | ICD-10-CM | POA: Diagnosis not present

## 2023-12-26 DIAGNOSIS — Z7982 Long term (current) use of aspirin: Secondary | ICD-10-CM | POA: Insufficient documentation

## 2023-12-26 DIAGNOSIS — Z5112 Encounter for antineoplastic immunotherapy: Secondary | ICD-10-CM | POA: Insufficient documentation

## 2023-12-26 DIAGNOSIS — J90 Pleural effusion, not elsewhere classified: Secondary | ICD-10-CM | POA: Diagnosis not present

## 2023-12-26 DIAGNOSIS — I700 Atherosclerosis of aorta: Secondary | ICD-10-CM | POA: Diagnosis not present

## 2023-12-26 DIAGNOSIS — Z79899 Other long term (current) drug therapy: Secondary | ICD-10-CM | POA: Insufficient documentation

## 2023-12-26 DIAGNOSIS — I129 Hypertensive chronic kidney disease with stage 1 through stage 4 chronic kidney disease, or unspecified chronic kidney disease: Secondary | ICD-10-CM | POA: Insufficient documentation

## 2023-12-26 DIAGNOSIS — E871 Hypo-osmolality and hyponatremia: Secondary | ICD-10-CM | POA: Diagnosis not present

## 2023-12-26 DIAGNOSIS — Z5111 Encounter for antineoplastic chemotherapy: Secondary | ICD-10-CM

## 2023-12-26 LAB — CBC WITH DIFFERENTIAL (CANCER CENTER ONLY)
Abs Immature Granulocytes: 0.02 10*3/uL (ref 0.00–0.07)
Basophils Absolute: 0.1 10*3/uL (ref 0.0–0.1)
Basophils Relative: 2 %
Eosinophils Absolute: 0.6 10*3/uL — ABNORMAL HIGH (ref 0.0–0.5)
Eosinophils Relative: 15 %
HCT: 37 % — ABNORMAL LOW (ref 39.0–52.0)
Hemoglobin: 12.1 g/dL — ABNORMAL LOW (ref 13.0–17.0)
Immature Granulocytes: 1 %
Lymphocytes Relative: 39 %
Lymphs Abs: 1.6 10*3/uL (ref 0.7–4.0)
MCH: 28.4 pg (ref 26.0–34.0)
MCHC: 32.7 g/dL (ref 30.0–36.0)
MCV: 86.9 fL (ref 80.0–100.0)
Monocytes Absolute: 0.4 10*3/uL (ref 0.1–1.0)
Monocytes Relative: 11 %
Neutro Abs: 1.2 10*3/uL — ABNORMAL LOW (ref 1.7–7.7)
Neutrophils Relative %: 32 %
Platelet Count: 184 10*3/uL (ref 150–400)
RBC: 4.26 MIL/uL (ref 4.22–5.81)
RDW: 16.8 % — ABNORMAL HIGH (ref 11.5–15.5)
WBC Count: 3.8 10*3/uL — ABNORMAL LOW (ref 4.0–10.5)
nRBC: 0 % (ref 0.0–0.2)

## 2023-12-26 LAB — COMPREHENSIVE METABOLIC PANEL
ALT: 11 U/L (ref 0–44)
AST: 12 U/L — ABNORMAL LOW (ref 15–41)
Albumin: 3.9 g/dL (ref 3.5–5.0)
Alkaline Phosphatase: 57 U/L (ref 38–126)
Anion gap: 5 (ref 5–15)
BUN: 13 mg/dL (ref 8–23)
CO2: 29 mmol/L (ref 22–32)
Calcium: 8.3 mg/dL — ABNORMAL LOW (ref 8.9–10.3)
Chloride: 107 mmol/L (ref 98–111)
Creatinine, Ser: 1.51 mg/dL — ABNORMAL HIGH (ref 0.61–1.24)
GFR, Estimated: 47 mL/min — ABNORMAL LOW (ref >60)
Glucose, Bld: 79 mg/dL (ref 70–99)
Potassium: 3.7 mmol/L (ref 3.5–5.1)
Sodium: 141 mmol/L (ref 135–145)
Total Bilirubin: 0.5 mg/dL (ref 0.0–1.2)
Total Protein: 6.7 g/dL (ref 6.5–8.1)

## 2023-12-26 MED ORDER — DEXAMETHASONE 4 MG PO TABS
12.0000 mg | ORAL_TABLET | Freq: Once | ORAL | Status: AC
  Administered 2023-12-26: 12 mg via ORAL
  Filled 2023-12-26: qty 3

## 2023-12-26 MED ORDER — ACETAMINOPHEN 325 MG PO TABS
650.0000 mg | ORAL_TABLET | Freq: Once | ORAL | Status: AC
  Administered 2023-12-26: 650 mg via ORAL
  Filled 2023-12-26: qty 2

## 2023-12-26 MED ORDER — FAMOTIDINE 20 MG PO TABS
20.0000 mg | ORAL_TABLET | Freq: Once | ORAL | Status: AC
  Administered 2023-12-26: 20 mg via ORAL
  Filled 2023-12-26: qty 1

## 2023-12-26 MED ORDER — DARATUMUMAB-HYALURONIDASE-FIHJ 1800-30000 MG-UT/15ML ~~LOC~~ SOLN
1800.0000 mg | Freq: Once | SUBCUTANEOUS | Status: AC
  Administered 2023-12-26: 1800 mg via SUBCUTANEOUS
  Filled 2023-12-26: qty 15

## 2023-12-26 MED ORDER — DIPHENHYDRAMINE HCL 25 MG PO CAPS
50.0000 mg | ORAL_CAPSULE | Freq: Once | ORAL | Status: AC
  Administered 2023-12-26: 50 mg via ORAL
  Filled 2023-12-26: qty 2

## 2023-12-26 MED ORDER — DENOSUMAB 120 MG/1.7ML ~~LOC~~ SOLN
120.0000 mg | Freq: Once | SUBCUTANEOUS | Status: AC
  Administered 2023-12-26: 120 mg via SUBCUTANEOUS
  Filled 2023-12-26: qty 1.7

## 2023-12-26 NOTE — Patient Instructions (Signed)
 [facility] - A DEPT OF [facility]  Discharge Instructions: Thank you for choosing [facility] to provide your oncology and hematology care.   If you have a lab appointment with the Cancer Center, please go directly to the Cancer Center and check in at the registration area.   Wear comfortable clothing and clothing appropriate for easy access to any Portacath or PICC line.   We strive to give you quality time with your provider. You may need to reschedule your appointment if you arrive late (15 or more minutes).  Arriving late affects you and other patients whose appointments are after yours.  Also, if you miss three or more appointments without notifying the office, you may be dismissed from the clinic at the provider's discretion.      For prescription refill requests, have your pharmacy contact our office and allow 72 hours for refills to be completed.    Today you received the following chemotherapy and/or immunotherapy agents Darzalex faspro      To help prevent nausea and vomiting after your treatment, we encourage you to take your nausea medication as directed.  BELOW ARE SYMPTOMS THAT SHOULD BE REPORTED IMMEDIATELY: *FEVER GREATER THAN 100.4 F (38 C) OR HIGHER *CHILLS OR SWEATING *NAUSEA AND VOMITING THAT IS NOT CONTROLLED WITH YOUR NAUSEA MEDICATION *UNUSUAL SHORTNESS OF BREATH *UNUSUAL BRUISING OR BLEEDING *URINARY PROBLEMS (pain or burning when urinating, or frequent urination) *BOWEL PROBLEMS (unusual diarrhea, constipation, pain near the anus) TENDERNESS IN MOUTH AND THROAT WITH OR WITHOUT PRESENCE OF ULCERS (sore throat, sores in mouth, or a toothache) UNUSUAL RASH, SWELLING OR PAIN  UNUSUAL VAGINAL DISCHARGE OR ITCHING   Items with * indicate a potential emergency and should be followed up as soon as possible or go to the Emergency Department if any problems should occur.  Please show the CHEMOTHERAPY ALERT CARD or  IMMUNOTHERAPY ALERT CARD at check-in to the Emergency Department and triage nurse.  Should you have questions after your visit or need to cancel or reschedule your appointment, please contact [facility] - A DEPT OF [facility]  Dept: [phone]  and follow the prompts.  Office hours are 8:00 a.m. to 4:30 p.m. Monday - Friday. Please note that voicemails left after 4:00 p.m. may not be returned until the following business day.  We are closed weekends and major holidays. You have access to a nurse at all times for urgent questions. Please call the main number to the clinic Dept: [phone] and follow the prompts.   For any non-urgent questions, you may also contact your provider using MyChart. We now offer e-Visits for anyone 40 and older to request care online for non-urgent symptoms. For details visit [email or web address].   Also download the MyChart app! Go to the app store, search "MyChart", open the app, select [place], and log in with your MyChart username and password.

## 2023-12-26 NOTE — Progress Notes (Signed)
 Patient seen by Dr. Onesimo Moccasin are within treatment parameters.  Labs reviewed: and are not all within treatment parameters.    Dr Onesimo aware: ANC: 1.2, CR: 1.51, CA: 8.3  Per physician team, patient is ready for treatment and there are NO modifications to the treatment plan.

## 2023-12-27 LAB — KAPPA/LAMBDA LIGHT CHAINS
Kappa free light chain: 37.2 mg/L — ABNORMAL HIGH (ref 3.3–19.4)
Kappa, lambda light chain ratio: 1.1 (ref 0.26–1.65)
Lambda free light chains: 33.7 mg/L — ABNORMAL HIGH (ref 5.7–26.3)

## 2023-12-27 LAB — SURGICAL PATHOLOGY

## 2023-12-29 ENCOUNTER — Other Ambulatory Visit: Payer: Self-pay

## 2023-12-29 DIAGNOSIS — C9002 Multiple myeloma in relapse: Secondary | ICD-10-CM

## 2023-12-29 MED ORDER — LENALIDOMIDE 10 MG PO CAPS: 10.0000 mg | ORAL_CAPSULE | Freq: Every day | ORAL | 0 refills | Status: DC

## 2024-01-01 ENCOUNTER — Encounter (HOSPITAL_COMMUNITY): Payer: Self-pay | Admitting: Hematology

## 2024-01-01 ENCOUNTER — Encounter: Payer: Self-pay | Admitting: Hematology

## 2024-01-01 NOTE — Progress Notes (Signed)
 HEMATOLOGY/ONCOLOGY CLINIC NOTE  Date of Service:.12/26/2023   Patient Care Team: Avva, Ravisankar, MD as PCP - General (Internal Medicine)  CHIEF COMPLAINTS/PURPOSE OF CONSULTATION:  F/u for continued a  HISTORY OF PRESENTING ILLNESS:  Marcus Hatfield is a wonderful 78 y.o. 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  Patient was seen by me as an inpatient on 05/20/2023.  He noted that his vision may be slightly better with the steroid. Had some mild insomnia but it was not too bothersome. Has been seen by radiation oncology and is going to be set up for CT simulation and to start radiation 05/21/2023.  Did have a CT chest abdomen pelvis without contrast to evaluate for any other soft tissue metastatic disease and whole-body skeletal survey to determine other overt progression and to rule out the possibility of further source of metastatic disease.  Today, he is accompanied by two family members. He reports that he is feeling well overall. He reports that his vision has improved and returned to baseline. He denies any glares in his vision which were previously present. He is able to count fingers and notes improved sight of colors on television.   His p.o. intake is normal and he notes drinking 4 16oz bottles daily. He denies any fevers, chills, night sweats, new back pain, fatigue, posterior neck pain, or abdominal pain.  He reports some nausea with previously taking Revlimid . Patient reports that he has an upcoming dental appointment.  INTERVAL HISTORY:  Marcus Hatfield is a wonderful 78 y.o. male who is here for continued evaluation and management of his multiple myeloma.  Patient is accompanied by his wife during this visit. He denies any new infection issues, fever, chills, night sweats, unexpected weight loss, SOB, headaches, vision change, abdominal pain, bone pain, chest pain, back pain, or leg swelling.   He tolerated  his previous treatment cycle well without any new or severe toxicities.   No infection issues.   No new headaches. No new vision concerns.  MEDICAL HISTORY:  Past Medical History:  Diagnosis Date   Anemia    Arthritis    CKD (chronic kidney disease) stage 3, GFR 30-59 ml/min (HCC) 20015   Creat 1.9   Hypertension    Multiple myeloma (HCC) 2016   [facility]   Prostate disorder 02/2017    SURGICAL HISTORY: Past Surgical History:  Procedure Laterality Date   BIOPSY  06/06/2023   Procedure: BIOPSY;  Surgeon: Federico Rosario BROCKS, MD;  Location: [facility] ENDOSCOPY;  Service: Gastroenterology;;   ESOPHAGOGASTRODUODENOSCOPY (EGD) WITH PROPOFOL  N/A 06/06/2023   Procedure: ESOPHAGOGASTRODUODENOSCOPY (EGD) WITH PROPOFOL ;  Surgeon: Federico Rosario BROCKS, MD;  Location: [facility] ENDOSCOPY;  Service: Gastroenterology;  Laterality: N/A;   EYE SURGERY Bilateral 01/30/2018   KNEE SURGERY     over ten years ago   NEPHRECTOMY Right 03/2015   ROBOTIC ASSITED PARTIAL NEPHRECTOMY Left 07/07/2021   Procedure: XI ROBOTIC ASSITED PARTIAL NEPHRECTOMY;  Surgeon: Alvaro Hummer, MD;  Location: [facility] ORS;  Service: Urology;  Laterality: Left;  5 HRS   XI ROBOTIC ASSISTED SIMPLE PROSTATECTOMY N/A 07/07/2021   Procedure: XI ROBOTIC ASSISTED SIMPLE PROSTATECTOMY;  Surgeon: Alvaro Hummer, MD;  Location: [facility] ORS;  Service: Urology;  Laterality: N/A;    SOCIAL HISTORY: Social History   Socioeconomic History   Marital status: Married    Spouse name: Museum/gallery Curator   Number of children: 2   Years of education: Not on file  Highest education level: Not on file  Occupational History   Occupation: Retired  Tobacco Use   Smoking status: Never   Smokeless tobacco: Never  Vaping Use   Vaping status: Never Used  Substance and Sexual Activity   Alcohol  use: No   Drug use: No   Sexual activity: Not on file  Other Topics Concern   Not on file  Social History Narrative   Not on file   Social Drivers of Health   Financial  Resource Strain: Not on file  Food Insecurity: Patient Declined (06/05/2023)   Hunger Vital Sign    Worried About Running Out of Food in the Last Year: Patient declined    Ran Out of Food in the Last Year: Patient declined  Transportation Needs: Patient Declined (06/05/2023)   PRAPARE - Administrator, Civil Service (Medical): Patient declined    Lack of Transportation (Non-Medical): Patient declined  Physical Activity: Not on file  Stress: Not on file  Social Connections: Not on file  Intimate Partner Violence: Patient Declined (06/05/2023)   Humiliation, Afraid, Rape, and Kick questionnaire    Fear of Current or Ex-Partner: Patient declined    Emotionally Abused: Patient declined    Physically Abused: Patient declined    Sexually Abused: Patient declined    FAMILY HISTORY: Family History  Problem Relation Age of Onset   Diabetes Father    Hypertension Brother     ALLERGIES:  is allergic to penicillins, latex, and tape.  MEDICATIONS:  Current Outpatient Medications  Medication Sig Dispense Refill   acyclovir  (ZOVIRAX ) 400 MG tablet Take 1 tablet (400 mg total) by mouth 2 (two) times daily. 60 tablet 11   amLODipine  (NORVASC ) 10 MG tablet Take 0.5 tablets (5 mg total) by mouth daily.     aspirin  81 MG chewable tablet Chew 81 mg by mouth daily.     carvedilol  (COREG ) 6.25 MG tablet Take 1 tablet (6.25 mg total) by mouth 2 (two) times daily with a meal.     diclofenac Sodium (VOLTAREN) 1 % GEL Apply 2 g topically daily as needed (for pain).     docusate sodium  (COLACE) 100 MG capsule Take 1 capsule (100 mg total) by mouth daily as needed for mild constipation.     ferrous sulfate  325 (65 FE) MG tablet Take 1 tablet (325 mg total) by mouth 2 (two) times daily as needed (iron).     finasteride  (PROSCAR ) 5 MG tablet Take 5 mg by mouth daily.     fluticasone  (FLONASE ) 50 MCG/ACT nasal spray Place 2 sprays into both nostrils daily as needed for allergies.     furosemide   (LASIX ) 20 MG tablet Take 0.5 tablets (10 mg total) by mouth every other day. 30 tablet    lactulose  (CHRONULAC ) 10 GM/15ML solution Take 10 g by mouth daily as needed for constipation.  5   lenalidomide  (REVLIMID ) 10 MG capsule Take 1 capsule (10 mg total) by mouth daily. Take for 21 days on, 7 days off, repeat every 28 days. 21 capsule 0   ondansetron  (ZOFRAN ) 8 MG tablet Take 1 tablet (8 mg total) by mouth every 8 (eight) hours as needed for nausea or vomiting. 30 tablet 1   oxyCODONE -acetaminophen  (PERCOCET/ROXICET) 5-325 MG tablet Take 0.5-1 tablets by mouth every 6 (six) hours as needed for severe pain (pain score 7-10). 20 tablet 0   pantoprazole  (PROTONIX ) 40 MG tablet Take 1 tablet (40 mg total) by mouth 2 (two) times daily. Take bid x  10 weeks, followed by daily thereafter 90 tablet 3   prochlorperazine  (COMPAZINE ) 10 MG tablet Take 1 tablet (10 mg total) by mouth every 6 (six) hours as needed for nausea or vomiting. 30 tablet 1   tamsulosin  (FLOMAX ) 0.4 MG CAPS capsule Take 1 capsule (0.4 mg total) by mouth daily after supper. 30 capsule 1   telmisartan (MICARDIS) 80 MG tablet Take 80 mg by mouth daily.     No current facility-administered medications for this visit.    REVIEW OF SYSTEMS:    .10 Point review of Systems was done is negative except as noted above.  PHYSICAL EXAMINATION: ECOG PERFORMANCE STATUS: 2 - Symptomatic, <50% confined to bed .BP 137/78 (BP Location: Right Arm, Patient Position: Sitting)   Pulse 67   Temp 97.7 F (36.5 C) (Temporal)   Resp 16   Wt 170 lb 14.4 oz (77.5 kg)   SpO2 100%   BMI 24.52 kg/m  . GENERAL:alert, in no acute distress and comfortable SKIN: no acute rashes, no significant lesions EYES: conjunctiva are pink and non-injected, sclera anicteric OROPHARYNX: MMM, no exudates, no oropharyngeal erythema or ulceration NECK: supple, no JVD LYMPH:  no palpable lymphadenopathy in the cervical, axillary or inguinal regions LUNGS: clear to  auscultation b/l with normal respiratory effort HEART: regular rate & rhythm ABDOMEN:  normoactive bowel sounds , non tender, not distended. Extremity: no pedal edema PSYCH: alert & oriented x 3 with fluent speech NEURO: no focal motor/sensory deficits    LABORATORY DATA:  I have reviewed the data as listed .    Latest Ref Rng & Units 12/26/2023   12:04 PM 12/25/2023    7:30 AM 11/28/2023   12:23 PM  CBC  WBC 4.0 - 10.5 K/uL 3.8  3.5  4.0   Hemoglobin 13.0 - 17.0 g/dL 87.8  88.4  88.7   Hematocrit 39.0 - 52.0 % 37.0  35.5  35.2   Platelets 150 - 400 K/uL 184  151  206    .    Latest Ref Rng & Units 12/26/2023   12:04 PM 11/28/2023   12:23 PM 11/14/2023   11:47 AM  CMP  Glucose 70 - 99 mg/dL 79  97  96   BUN 8 - 23 mg/dL 13  14  15    Creatinine 0.61 - 1.24 mg/dL 8.48  8.63  8.37   Sodium 135 - 145 mmol/L 141  142  140   Potassium 3.5 - 5.1 mmol/L 3.7  3.6  3.7   Chloride 98 - 111 mmol/L 107  110  109   CO2 22 - 32 mmol/L 29  26  26    Calcium  8.9 - 10.3 mg/dL 8.3  7.9  8.3   Total Protein 6.5 - 8.1 g/dL 6.7  6.2  6.1   Total Bilirubin 0.0 - 1.2 mg/dL 0.5  0.4  0.4   Alkaline Phos 38 - 126 U/L 57  58  57   AST 15 - 41 U/L 12  11  12    ALT 0 - 44 U/L 11  10  11       RADIOGRAPHIC STUDIES: I have personally reviewed the radiological images as listed and agreed with the findings in the report. NM PET Image Restage (PS) Whole Body Result Date: 01/01/2024 CLINICAL DATA:  Subsequent treatment strategy for multiple myeloma. EXAM: NUCLEAR MEDICINE PET WHOLE BODY TECHNIQUE: 8.5 mCi F-18 FDG was injected intravenously. Full-ring PET imaging was performed from the head to foot after the radiotracer. CT data was  obtained and used for attenuation correction and anatomic localization. Fasting blood glucose: 88 mg/dl COMPARISON:  92/88/7975. FINDINGS: Mediastinal blood pool activity: SUV max 3.5 HEAD/NECK: No abnormal hypermetabolism. Incidental CT findings: None. CHEST: New area of  consolidation/volume loss in the apical segment right upper lobe, SUV max 3.1. 10 mm subcarinal lymph node, SUV max 3.4. Minimal bihilar hypermetabolism up to SUV max 3.8 on right. Correlation with CT is challenging without IV contrast. Incidental CT findings: Minimal atherosclerotic calcification of the aorta. Heart is at the upper limits of normal in size to mildly enlarged. Small right pleural effusion. ABDOMEN/PELVIS: No abnormal hypermetabolism. Incidental CT findings: Tiny gallstone. Right adrenalectomy and right nephrectomy. Post treatment scarring in the left kidney. Left renal cyst. No specific follow-up necessary. Right lateral abdominal wall hernia contains unobstructed small bowel and colon. Trans urethral resection of the prostate defect. SKELETON: Scattered vague uptake within lytic lesions throughout the visualized osseous structures, stable. Index left iliac wing lesion measures 1.8 x 2.9 cm (6/203), SUV max 2.5, stable. Incidental CT findings: Lytic lesions throughout the visualized osseous structures, including the skull base. Degenerative changes in the spine. Old rib fractures. EXTREMITIES: No additional abnormal hypermetabolism. Incidental CT findings: None. IMPRESSION: 1. Persistent mild hypometabolism involving lytic lesions throughout the visualized osseous structures, stable and compatible with multiple myeloma. 2. New area of hypometabolic consolidation/volume loss in the apical segment right upper lobe, possibly infectious/postinfectious in etiology or related to interval therapy. Recommend attention on follow-up. 3. Small borderline hypermetabolic subcarinal lymph node with minimal bihilar hypermetabolism, possibly reactive in etiology. Again, recommend attention on follow-up. 4. Small right pleural effusion. 5. Cholelithiasis. 6. Right lateral abdominal wall hernia contains portions small bowel and colon. 7.  Aortic atherosclerosis (ICD10-I70.0). Electronically Signed   By: Newell Eke M.D.   On: 01/01/2024 08:41   CT BONE MARROW BIOPSY & ASPIRATION Result Date: 12/25/2023 INDICATION: unilateral bone marrow aspiration and biopsy to evaluate myeloma response to treatment EXAM: CT GUIDED BONE MARROW ASPIRATION AND CORE BIOPSY MEDICATIONS: None. ANESTHESIA/SEDATION: Moderate (conscious) sedation was employed during this procedure. A total of Versed  2 mg and Fentanyl  100 mcg was administered intravenously. Moderate Sedation Time: 11 minutes. The patient's level of consciousness and vital signs were monitored continuously by radiology nursing throughout the procedure under my direct supervision. FLUOROSCOPY TIME:  CT dose; 123 mGycm COMPLICATIONS: None immediate. Estimated blood loss: <5 mL PROCEDURE: RADIATION DOSE REDUCTION: This exam was performed according to the departmental dose-optimization program which includes automated exposure control, adjustment of the mA and/or kV according to patient size and/or use of iterative reconstruction technique. Informed written consent was obtained from the the patient and/or patient's representative after a thorough discussion of the procedural risks, benefits and alternatives. All questions were addressed. Maximal Sterile Barrier Technique was utilized including caps, mask, sterile gowns, sterile gloves, sterile drape, hand hygiene and skin antiseptic. A timeout was performed prior to the initiation of the procedure. The patient was positioned prone and non-contrast localization CT was performed of the pelvis to demonstrate the iliac marrow spaces. Maximal barrier sterile technique utilized including caps, mask, sterile gowns, sterile gloves, large sterile drape, hand hygiene, and chlorhexidine  prep. Under sterile conditions and local anesthesia, an 11 gauge coaxial bone biopsy needle was advanced into the RIGHT iliac marrow space. Needle position was confirmed with CT imaging. Initially, bone marrow aspiration was performed. Next, the 11 gauge outer  cannula was utilized to obtain a 1 iliac bone marrow core biopsy. Needle was removed. Hemostasis was  obtained with compression. The patient tolerated the procedure well. Samples were prepared with the cytotechnologist. IMPRESSION: Successful CT-guided bone marrow aspiration biopsy. Thom Hall, MD [facility] Electronically Signed   By: Thom Hall M.D.   On: 12/25/2023 10:07    ASSESSMENT & PLAN:   78year-old male with   #1 Progressive IgG kappa myeloma: Initially diagnosed in 2016 as stage II disease with standard risk cytogenetics today. 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 Status post treatment as noted above. Last seen by Dr. Cindie at [facility] in January 2024 and noted to have progressive disease with increase in his IgG kappa M spike at 2.87 g/dL.   Recent light chains done on 05/20/2023 at [facility] showed increase in free kappa light chains to 603 with a kappa lambda ratio of 25 this is up from kappa light chains of 402 with a kappa lambda ratio of 12.6 on 01/03/2023. CBC shows relatively stable hemoglobin of 11.3 with normal WBC count and platelets BMP shows stable chronic kidney disease with a creatinine of 1.87.  No overt hypercalcemia with a calcium  of 9   #2 recent right clavicular pain with x-ray on 05/06/2023 showing mildly displaced likely pathologic fracture of the medial right clavicle.  Multiple other lytic lesions noted.   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  CT head done today 05/20/2023 shows  Large mass at the skull base, eroding and expanding the clivus, also involving the sphenoid bone and the petrous portions of both temporal bones. The mass broadly abuts both optic nerves between the chiasm and orbital apices. Mass also broadly involves both cavernous sinuses. 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 Recommend further assessment with brain MRI without and with contrast.   MRI brain 05/20/2023 1. 7.8 x 5.6 x 3.3 cm enhancing mass lesion at the skull base extending along the petrous ridge bilaterally, greater on the right. 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 There are some intrinsic T2 signal scratched at there is some intrinsic T2 signal and chordoma still considered. However, given the multiple other enhancing lesions throughout the skull, this most likely represents metastatic disease. 2. No acute intracranial abnormality. 3. Mild atrophy and white matter changes are likely within normal limits for age. 4. Right mastoid effusion secondary to obstruction of the eustachian tube. 5. Minimal right maxillary sinus disease.   ADDENDUM: Lytic lesions have been identified previously with this patient. 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   #4 hyponatremia   #5 history of renal cell carcinoma right nephrectomy on 02/03/2016.  He received cryotherapy to his left renal mass in April 2017. Surgically resected by Dr. Ricardo Likens at [facility] on July 07, 2021. 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 Tumor is limited to the kidney (pT1a).   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 2. New area of hypometabolic consolidation/volume loss  in the apical segment right upper lobe, possibly infectious/postinfectious in etiology or related to interval therapy. Recommend attention on follow-up. -patient counseled to stay up to speed with vaccinations - maintain f/u with PCP for father  Patient can proceed with treatment today.  -Continue iron supplement, twice every week.    . No orders of the defined types were placed in this encounter.   FOLLOW-UP: Continue daratumumab  per integrated scheduling  .The total time spent in the appointment was 30 minutes* .  All of the patient's questions were answered with apparent satisfaction. The patient knows to call the clinic with any problems, questions or concerns.   Emaline Saran MD MS AAHIVMS [facility] Hematology/Oncology Physician [facility]  .*Total Encounter Time as defined by the Centers for Medicare and Medicaid Services includes, in addition to the face-to-face time of a patient visit (documented in the note above) non-face-to-face time: obtaining and reviewing outside history, ordering and reviewing medications, tests or procedures, care coordination (communications with other health care professionals or caregivers) and documentation in the medical record.

## 2024-01-03 ENCOUNTER — Encounter (HOSPITAL_COMMUNITY): Payer: Self-pay | Admitting: Hematology

## 2024-01-04 LAB — MULTIPLE MYELOMA PANEL, SERUM
Albumin SerPl Elph-Mcnc: 3.4 g/dL (ref 2.9–4.4)
Albumin/Glob SerPl: 1.4 (ref 0.7–1.7)
Alpha 1: 0.2 g/dL (ref 0.0–0.4)
Alpha2 Glob SerPl Elph-Mcnc: 0.7 g/dL (ref 0.4–1.0)
B-Globulin SerPl Elph-Mcnc: 0.9 g/dL (ref 0.7–1.3)
Gamma Glob SerPl Elph-Mcnc: 0.8 g/dL (ref 0.4–1.8)
Globulin, Total: 2.6 g/dL (ref 2.2–3.9)
IgA: 133 mg/dL (ref 61–437)
IgG (Immunoglobin G), Serum: 940 mg/dL (ref 603–1613)
IgM (Immunoglobulin M), Srm: 12 mg/dL — ABNORMAL LOW (ref 15–143)
Total Protein ELP: 6 g/dL (ref 6.0–8.5)

## 2024-01-23 ENCOUNTER — Encounter: Payer: Self-pay | Admitting: Hematology

## 2024-01-23 ENCOUNTER — Inpatient Hospital Stay: Payer: Medicare HMO

## 2024-01-23 ENCOUNTER — Inpatient Hospital Stay: Payer: Medicare HMO | Attending: Hematology

## 2024-01-23 ENCOUNTER — Inpatient Hospital Stay (HOSPITAL_BASED_OUTPATIENT_CLINIC_OR_DEPARTMENT_OTHER): Payer: Medicare HMO | Admitting: Hematology

## 2024-01-23 ENCOUNTER — Ambulatory Visit: Payer: Medicare HMO

## 2024-01-23 VITALS — BP 118/74 | HR 62 | Temp 97.7°F | Resp 17 | Wt 171.3 lb

## 2024-01-23 DIAGNOSIS — G4700 Insomnia, unspecified: Secondary | ICD-10-CM | POA: Diagnosis not present

## 2024-01-23 DIAGNOSIS — E871 Hypo-osmolality and hyponatremia: Secondary | ICD-10-CM | POA: Diagnosis not present

## 2024-01-23 DIAGNOSIS — C9002 Multiple myeloma in relapse: Secondary | ICD-10-CM

## 2024-01-23 DIAGNOSIS — C9000 Multiple myeloma not having achieved remission: Secondary | ICD-10-CM | POA: Insufficient documentation

## 2024-01-23 DIAGNOSIS — Z7189 Other specified counseling: Secondary | ICD-10-CM

## 2024-01-23 DIAGNOSIS — Z5112 Encounter for antineoplastic immunotherapy: Secondary | ICD-10-CM | POA: Insufficient documentation

## 2024-01-23 DIAGNOSIS — Z7961 Long term (current) use of immunomodulator: Secondary | ICD-10-CM | POA: Insufficient documentation

## 2024-01-23 DIAGNOSIS — Z7982 Long term (current) use of aspirin: Secondary | ICD-10-CM | POA: Diagnosis not present

## 2024-01-23 DIAGNOSIS — M545 Low back pain, unspecified: Secondary | ICD-10-CM | POA: Insufficient documentation

## 2024-01-23 DIAGNOSIS — I129 Hypertensive chronic kidney disease with stage 1 through stage 4 chronic kidney disease, or unspecified chronic kidney disease: Secondary | ICD-10-CM | POA: Insufficient documentation

## 2024-01-23 DIAGNOSIS — Z79899 Other long term (current) drug therapy: Secondary | ICD-10-CM | POA: Insufficient documentation

## 2024-01-23 DIAGNOSIS — M129 Arthropathy, unspecified: Secondary | ICD-10-CM | POA: Diagnosis not present

## 2024-01-23 DIAGNOSIS — D649 Anemia, unspecified: Secondary | ICD-10-CM | POA: Diagnosis not present

## 2024-01-23 DIAGNOSIS — N183 Chronic kidney disease, stage 3 unspecified: Secondary | ICD-10-CM | POA: Diagnosis not present

## 2024-01-23 DIAGNOSIS — J320 Chronic maxillary sinusitis: Secondary | ICD-10-CM | POA: Insufficient documentation

## 2024-01-23 LAB — COMPREHENSIVE METABOLIC PANEL
ALT: 12 U/L (ref 0–44)
AST: 13 U/L — ABNORMAL LOW (ref 15–41)
Albumin: 3.9 g/dL (ref 3.5–5.0)
Alkaline Phosphatase: 54 U/L (ref 38–126)
Anion gap: 4 — ABNORMAL LOW (ref 5–15)
BUN: 14 mg/dL (ref 8–23)
CO2: 28 mmol/L (ref 22–32)
Calcium: 8.1 mg/dL — ABNORMAL LOW (ref 8.9–10.3)
Chloride: 110 mmol/L (ref 98–111)
Creatinine, Ser: 1.48 mg/dL — ABNORMAL HIGH (ref 0.61–1.24)
GFR, Estimated: 48 mL/min — ABNORMAL LOW (ref >60)
Glucose, Bld: 99 mg/dL (ref 70–99)
Potassium: 3.5 mmol/L (ref 3.5–5.1)
Sodium: 142 mmol/L (ref 135–145)
Total Bilirubin: 0.4 mg/dL (ref 0.0–1.2)
Total Protein: 6.3 g/dL — ABNORMAL LOW (ref 6.5–8.1)

## 2024-01-23 LAB — CBC WITH DIFFERENTIAL (CANCER CENTER ONLY)
Abs Immature Granulocytes: 0.01 10*3/uL (ref 0.00–0.07)
Basophils Absolute: 0 10*3/uL (ref 0.0–0.1)
Basophils Relative: 1 %
Eosinophils Absolute: 0.5 10*3/uL (ref 0.0–0.5)
Eosinophils Relative: 12 %
HCT: 37.2 % — ABNORMAL LOW (ref 39.0–52.0)
Hemoglobin: 12 g/dL — ABNORMAL LOW (ref 13.0–17.0)
Immature Granulocytes: 0 %
Lymphocytes Relative: 39 %
Lymphs Abs: 1.6 10*3/uL (ref 0.7–4.0)
MCH: 27.8 pg (ref 26.0–34.0)
MCHC: 32.3 g/dL (ref 30.0–36.0)
MCV: 86.3 fL (ref 80.0–100.0)
Monocytes Absolute: 0.4 10*3/uL (ref 0.1–1.0)
Monocytes Relative: 10 %
Neutro Abs: 1.5 10*3/uL — ABNORMAL LOW (ref 1.7–7.7)
Neutrophils Relative %: 38 %
Platelet Count: 138 10*3/uL — ABNORMAL LOW (ref 150–400)
RBC: 4.31 MIL/uL (ref 4.22–5.81)
RDW: 17.2 % — ABNORMAL HIGH (ref 11.5–15.5)
WBC Count: 4.1 10*3/uL (ref 4.0–10.5)
nRBC: 0 % (ref 0.0–0.2)

## 2024-01-23 MED ORDER — DIPHENHYDRAMINE HCL 25 MG PO CAPS
50.0000 mg | ORAL_CAPSULE | Freq: Once | ORAL | Status: AC
Start: 2024-01-23 — End: 2024-01-23
  Administered 2024-01-23: 50 mg via ORAL
  Filled 2024-01-23: qty 2

## 2024-01-23 MED ORDER — ACETAMINOPHEN 325 MG PO TABS
650.0000 mg | ORAL_TABLET | Freq: Once | ORAL | Status: AC
  Administered 2024-01-23: 650 mg via ORAL
  Filled 2024-01-23: qty 2

## 2024-01-23 MED ORDER — DEXAMETHASONE 4 MG PO TABS
12.0000 mg | ORAL_TABLET | Freq: Once | ORAL | Status: AC
  Administered 2024-01-23: 12 mg via ORAL
  Filled 2024-01-23: qty 3

## 2024-01-23 MED ORDER — FAMOTIDINE 20 MG PO TABS
20.0000 mg | ORAL_TABLET | Freq: Once | ORAL | Status: AC
  Administered 2024-01-23: 20 mg via ORAL
  Filled 2024-01-23: qty 1

## 2024-01-23 MED ORDER — DENOSUMAB 120 MG/1.7ML ~~LOC~~ SOLN
120.0000 mg | Freq: Once | SUBCUTANEOUS | Status: AC
  Administered 2024-01-23: 120 mg via SUBCUTANEOUS
  Filled 2024-01-23: qty 1.7

## 2024-01-23 MED ORDER — DARATUMUMAB-HYALURONIDASE-FIHJ 1800-30000 MG-UT/15ML ~~LOC~~ SOLN
1800.0000 mg | Freq: Once | SUBCUTANEOUS | Status: AC
  Administered 2024-01-23: 1800 mg via SUBCUTANEOUS
  Filled 2024-01-23: qty 15

## 2024-01-23 NOTE — Progress Notes (Signed)
 Patient seen by Dr. Onesimo Moccasin are within treatment parameters.  Labs reviewed: and are not all within treatment parameters.   Dr Onesimo aware : CA: 8.1, Plts 138  Per physician team, patient is ready for treatment and there are NO modifications to the treatment plan.

## 2024-01-23 NOTE — Progress Notes (Signed)
 HEMATOLOGY/ONCOLOGY CLINIC NOTE  Date of Service: 01/23/24    Patient Care Team: Avva, Ravisankar, MD as PCP - General (Internal Medicine)  CHIEF COMPLAINTS/PURPOSE OF CONSULTATION:  F/u for continued a  HISTORY OF PRESENTING ILLNESS:  Marcus Hatfield is a wonderful 78 y.o. 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  Patient was seen by me as an inpatient on 05/20/2023.  He noted that his vision may be slightly better with the steroid. Had some mild insomnia but it was not too bothersome. Has been seen by radiation oncology and is going to be set up for CT simulation and to start radiation 05/21/2023.  Did have a CT chest abdomen pelvis without contrast to evaluate for any other soft tissue metastatic disease and whole-body skeletal survey to determine other overt progression and to rule out the possibility of further source of metastatic disease.  Today, he is accompanied by two family members. He reports that he is feeling well overall. He reports that his vision has improved and returned to baseline. He denies any glares in his vision which were previously present. He is able to count fingers and notes improved sight of colors on television.   His p.o. intake is normal and he notes drinking 4 16oz bottles daily. He denies any fevers, chills, night sweats, new back pain, fatigue, posterior neck pain, or abdominal pain.  He reports some nausea with previously taking Revlimid . Patient reports that he has an upcoming dental appointment.  INTERVAL HISTORY:  Marcus Hatfield is a wonderful 78 y.o. male who is here for continued evaluation and management of his multiple myeloma.  Patient was last seen by me on 12/26/2023 and he was doing well overall.  Patient is accompanied by his wife. Patient notes he has been doing well overall since our last visit. He denies any new infection issues, fever, chills, night sweats, unexpected  weight loss, chest pain, abdominal pain, or leg swelling.  He complains of lower back pain when he seats without back support. He notes that the pain is positional.  Patient has been tolerating his current Revlimid  dose of 10 mg and his maintenance Daratumumab  treatment well without any new or severe toxicities.    MEDICAL HISTORY:  Past Medical History:  Diagnosis Date   Anemia    Arthritis    CKD (chronic kidney disease) stage 3, GFR 30-59 ml/min (HCC) 20015   Creat 1.9   Hypertension    Multiple myeloma (HCC) 2016   [facility]   Prostate disorder 02/2017    SURGICAL HISTORY: Past Surgical History:  Procedure Laterality Date   BIOPSY  06/06/2023   Procedure: BIOPSY;  Surgeon: Federico Rosario BROCKS, MD;  Location: [facility] ENDOSCOPY;  Service: Gastroenterology;;   ESOPHAGOGASTRODUODENOSCOPY (EGD) WITH PROPOFOL  N/A 06/06/2023   Procedure: ESOPHAGOGASTRODUODENOSCOPY (EGD) WITH PROPOFOL ;  Surgeon: Federico Rosario BROCKS, MD;  Location: [facility] ENDOSCOPY;  Service: Gastroenterology;  Laterality: N/A;   EYE SURGERY Bilateral 01/30/2018   KNEE SURGERY     over ten years ago   NEPHRECTOMY Right 03/2015   ROBOTIC ASSITED PARTIAL NEPHRECTOMY Left 07/07/2021   Procedure: XI ROBOTIC ASSITED PARTIAL NEPHRECTOMY;  Surgeon: Alvaro Hummer, MD;  Location: [facility] ORS;  Service: Urology;  Laterality: Left;  5 HRS   XI ROBOTIC ASSISTED SIMPLE PROSTATECTOMY N/A 07/07/2021   Procedure: XI ROBOTIC ASSISTED SIMPLE PROSTATECTOMY;  Surgeon: Alvaro Hummer, MD;  Location: [facility] ORS;  Service: Urology;  Laterality: N/A;    SOCIAL  HISTORY: Social History   Socioeconomic History   Marital status: Married    Spouse name: Museum/gallery Curator   Number of children: 2   Years of education: Not on file   Highest education level: Not on file  Occupational History   Occupation: Retired  Tobacco Use   Smoking status: Never   Smokeless tobacco: Never  Vaping Use   Vaping status: Never Used  Substance and Sexual Activity   Alcohol  use:  No   Drug use: No   Sexual activity: Not on file  Other Topics Concern   Not on file  Social History Narrative   Not on file   Social Drivers of Health   Financial Resource Strain: Not on file  Food Insecurity: Patient Declined (06/05/2023)   Hunger Vital Sign    Worried About Running Out of Food in the Last Year: Patient declined    Ran Out of Food in the Last Year: Patient declined  Transportation Needs: Patient Declined (06/05/2023)   PRAPARE - Administrator, Civil Service (Medical): Patient declined    Lack of Transportation (Non-Medical): Patient declined  Physical Activity: Not on file  Stress: Not on file  Social Connections: Not on file  Intimate Partner Violence: Patient Declined (06/05/2023)   Humiliation, Afraid, Rape, and Kick questionnaire    Fear of Current or Ex-Partner: Patient declined    Emotionally Abused: Patient declined    Physically Abused: Patient declined    Sexually Abused: Patient declined    FAMILY HISTORY: Family History  Problem Relation Age of Onset   Diabetes Father    Hypertension Brother     ALLERGIES:  is allergic to penicillins, latex, and tape.  MEDICATIONS:  Current Outpatient Medications  Medication Sig Dispense Refill   acyclovir  (ZOVIRAX ) 400 MG tablet Take 1 tablet (400 mg total) by mouth 2 (two) times daily. 60 tablet 11   amLODipine  (NORVASC ) 10 MG tablet Take 0.5 tablets (5 mg total) by mouth daily.     aspirin  81 MG chewable tablet Chew 81 mg by mouth daily.     carvedilol  (COREG ) 6.25 MG tablet Take 1 tablet (6.25 mg total) by mouth 2 (two) times daily with a meal.     diclofenac Sodium (VOLTAREN) 1 % GEL Apply 2 g topically daily as needed (for pain).     docusate sodium  (COLACE) 100 MG capsule Take 1 capsule (100 mg total) by mouth daily as needed for mild constipation.     ferrous sulfate  325 (65 FE) MG tablet Take 1 tablet (325 mg total) by mouth 2 (two) times daily as needed (iron).     finasteride  (PROSCAR )  5 MG tablet Take 5 mg by mouth daily.     fluticasone  (FLONASE ) 50 MCG/ACT nasal spray Place 2 sprays into both nostrils daily as needed for allergies.     furosemide  (LASIX ) 20 MG tablet Take 0.5 tablets (10 mg total) by mouth every other day. 30 tablet    lactulose  (CHRONULAC ) 10 GM/15ML solution Take 10 g by mouth daily as needed for constipation.  5   lenalidomide  (REVLIMID ) 10 MG capsule Take 1 capsule (10 mg total) by mouth daily. Take for 21 days on, 7 days off, repeat every 28 days. 21 capsule 0   ondansetron  (ZOFRAN ) 8 MG tablet Take 1 tablet (8 mg total) by mouth every 8 (eight) hours as needed for nausea or vomiting. 30 tablet 1   oxyCODONE -acetaminophen  (PERCOCET/ROXICET) 5-325 MG tablet Take 0.5-1 tablets by mouth every 6 (  six) hours as needed for severe pain (pain score 7-10). 20 tablet 0   pantoprazole  (PROTONIX ) 40 MG tablet Take 1 tablet (40 mg total) by mouth 2 (two) times daily. Take bid x 10 weeks, followed by daily thereafter 90 tablet 3   prochlorperazine  (COMPAZINE ) 10 MG tablet Take 1 tablet (10 mg total) by mouth every 6 (six) hours as needed for nausea or vomiting. 30 tablet 1   tamsulosin  (FLOMAX ) 0.4 MG CAPS capsule Take 1 capsule (0.4 mg total) by mouth daily after supper. 30 capsule 1   telmisartan (MICARDIS) 80 MG tablet Take 80 mg by mouth daily.     No current facility-administered medications for this visit.    REVIEW OF SYSTEMS:    .10 Point review of Systems was done is negative except as noted above.  PHYSICAL EXAMINATION: ECOG PERFORMANCE STATUS: 2 - Symptomatic, <50% confined to bed .BP 118/74 (BP Location: Left Arm, Patient Position: Sitting)   Pulse 62   Temp 97.7 F (36.5 C) (Temporal)   Resp 17   Wt 171 lb 4.8 oz (77.7 kg)   SpO2 100%   BMI 24.58 kg/m  . GENERAL:alert, in no acute distress and comfortable SKIN: no acute rashes, no significant lesions EYES: conjunctiva are pink and non-injected, sclera anicteric OROPHARYNX: MMM, no exudates,  no oropharyngeal erythema or ulceration NECK: supple, no JVD LYMPH:  no palpable lymphadenopathy in the cervical, axillary or inguinal regions LUNGS: clear to auscultation b/l with normal respiratory effort HEART: regular rate & rhythm ABDOMEN:  normoactive bowel sounds , non tender, not distended. Extremity: no pedal edema PSYCH: alert & oriented x 3 with fluent speech NEURO: no focal motor/sensory deficits    LABORATORY DATA:  I have reviewed the data as listed .    Latest Ref Rng & Units 01/23/2024    1:32 PM 12/26/2023   12:04 PM 12/25/2023    7:30 AM  CBC  WBC 4.0 - 10.5 K/uL 4.1  3.8  3.5   Hemoglobin 13.0 - 17.0 g/dL 87.9  87.8  88.4   Hematocrit 39.0 - 52.0 % 37.2  37.0  35.5   Platelets 150 - 400 K/uL 138  184  151    .    Latest Ref Rng & Units 01/23/2024    1:32 PM 12/26/2023   12:04 PM 11/28/2023   12:23 PM  CMP  Glucose 70 - 99 mg/dL 99  79  97   BUN 8 - 23 mg/dL 14  13  14    Creatinine 0.61 - 1.24 mg/dL 8.51  8.48  8.63   Sodium 135 - 145 mmol/L 142  141  142   Potassium 3.5 - 5.1 mmol/L 3.5  3.7  3.6   Chloride 98 - 111 mmol/L 110  107  110   CO2 22 - 32 mmol/L 28  29  26    Calcium  8.9 - 10.3 mg/dL 8.1  8.3  7.9   Total Protein 6.5 - 8.1 g/dL 6.3  6.7  6.2   Total Bilirubin 0.0 - 1.2 mg/dL 0.4  0.5  0.4   Alkaline Phos 38 - 126 U/L 54  57  58   AST 15 - 41 U/L 13  12  11    ALT 0 - 44 U/L 12  11  10       RADIOGRAPHIC STUDIES: I have personally reviewed the radiological images as listed and agreed with the findings in the report. CT BONE MARROW BIOPSY & ASPIRATION Result Date: 12/25/2023 INDICATION: unilateral bone  marrow aspiration and biopsy to evaluate myeloma response to treatment EXAM: CT GUIDED BONE MARROW ASPIRATION AND CORE BIOPSY MEDICATIONS: None. ANESTHESIA/SEDATION: Moderate (conscious) sedation was employed during this procedure. A total of Versed  2 mg and Fentanyl  100 mcg was administered intravenously. Moderate Sedation Time: 11 minutes. The  patient's level of consciousness and vital signs were monitored continuously by radiology nursing throughout the procedure under my direct supervision. FLUOROSCOPY TIME:  CT dose; 123 mGycm COMPLICATIONS: None immediate. Estimated blood loss: <5 mL PROCEDURE: RADIATION DOSE REDUCTION: This exam was performed according to the departmental dose-optimization program which includes automated exposure control, adjustment of the mA and/or kV according to patient size and/or use of iterative reconstruction technique. Informed written consent was obtained from the the patient and/or patient's representative after a thorough discussion of the procedural risks, benefits and alternatives. All questions were addressed. Maximal Sterile Barrier Technique was utilized including caps, mask, sterile gowns, sterile gloves, sterile drape, hand hygiene and skin antiseptic. A timeout was performed prior to the initiation of the procedure. The patient was positioned prone and non-contrast localization CT was performed of the pelvis to demonstrate the iliac marrow spaces. Maximal barrier sterile technique utilized including caps, mask, sterile gowns, sterile gloves, large sterile drape, hand hygiene, and chlorhexidine  prep. Under sterile conditions and local anesthesia, an 11 gauge coaxial bone biopsy needle was advanced into the RIGHT iliac marrow space. Needle position was confirmed with CT imaging. Initially, bone marrow aspiration was performed. Next, the 11 gauge outer cannula was utilized to obtain a 1 iliac bone marrow core biopsy. Needle was removed. Hemostasis was obtained with compression. The patient tolerated the procedure well. Samples were prepared with the cytotechnologist. IMPRESSION: Successful CT-guided bone marrow aspiration biopsy. Thom Hall, MD [facility] Radiology Electronically Signed   By: Thom Hall M.D.   On: 12/25/2023 10:07    ASSESSMENT & PLAN:    78year-old male with   #1 Progressive IgG kappa myeloma: Initially diagnosed in 2016 as stage II disease with standard risk cytogenetics today. 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 Status post treatment as noted above. Last seen by Dr. Cindie at [facility] in January 2024 and noted to have progressive disease with increase in his IgG kappa M spike at 2.87 g/dL.   Recent light chains done on 05/20/2023 at [facility] showed increase in free kappa light chains to 603 with a kappa lambda ratio of 25 this is up from kappa light chains of 402 with a kappa lambda ratio of 12.6 on 01/03/2023. CBC shows relatively stable hemoglobin of 11.3 with normal WBC count and platelets BMP shows stable chronic kidney disease with a creatinine of 1.87.  No overt hypercalcemia with a calcium  of 9   #2 recent right clavicular pain with x-ray on 05/06/2023 showing mildly displaced likely pathologic fracture of the medial right clavicle.  Multiple other lytic lesions noted.   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  CT head done today 05/20/2023 shows  Large mass at the skull base, eroding and expanding the clivus, also involving the sphenoid bone and the petrous portions of both temporal bones. The mass broadly abuts both optic nerves between the chiasm and orbital apices. Mass also broadly involves both cavernous sinuses. 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 Recommend further assessment with  brain MRI without and with contrast.   MRI brain 05/20/2023 1. 7.8 x 5.6 x 3.3 cm enhancing mass lesion at the skull base extending along the petrous ridge bilaterally, greater on the right. 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 There are some intrinsic T2 signal scratched at there is some intrinsic T2 signal and chordoma  still considered. However, given the multiple other enhancing lesions throughout the skull, this most likely represents metastatic disease. 2. No acute intracranial abnormality. 3. Mild atrophy and white matter changes are likely within normal limits for age. 4. Right mastoid effusion secondary to obstruction of the eustachian tube. 5. Minimal right maxillary sinus disease.   ADDENDUM: Lytic lesions have been identified previously with this patient. 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   #4 hyponatremia   #5 history of renal cell carcinoma right nephrectomy on 02/03/2016.  He received cryotherapy to his left renal mass in April 2017. Surgically resected by Dr. Ricardo Likens at [facility] on July 07, 2021. 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 Tumor is limited to the kidney (pT1a).   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 CBC shows slightly low hgb of 12.0 g/dL with hct of 62.7% and low platelets of 138 K.  CMP stable. -Patient has been tolerating his maintenance Daratumumab  treatment well without any new or severe toxicities.  -Patient has been tolerating his current dose of Revlimid  10 mg well without any new or severe toxicities.  -Pt can proceed with his maintenance Daratumumab  treatment without dose modifications.  -Pt can continue Revlimid  10 mg. -Answered all of patient's questions.    FOLLOW-UP: Per integrated scheduling  .The total time spent in the appointment was 30 minutes* .  All of the patient's questions were answered with apparent satisfaction. The patient knows to call the clinic with any problems, questions or concerns.   Emaline Saran MD MS AAHIVMS [facility] Hematology/Oncology Physician [facility]  .*Total Encounter Time as defined by the Centers for Medicare and Medicaid Services includes, in addition to the face-to-face time of a patient visit (documented in the note above) non-face-to-face time: obtaining and reviewing outside history, ordering and reviewing medications, tests or procedures, care coordination (communications with other health care professionals or caregivers) and documentation in the medical record.   I,Param Shah,acting as a neurosurgeon for Emaline Saran, MD.,have documented all relevant documentation on the behalf of Emaline Saran, MD,as directed by  Emaline Saran, MD while in the presence of Emaline Saran, MD.  .I have reviewed the above documentation for accuracy and completeness, and I agree with the above. .Kaiesha Tonner Kishore Shravya Wickwire MD

## 2024-01-23 NOTE — Patient Instructions (Signed)
 [facility] - A DEPT OF [facility]  Discharge Instructions: Thank you for choosing [facility] to provide your oncology and hematology care.   If you have a lab appointment with the Cancer Center, please go directly to the Cancer Center and check in at the registration area.   Wear comfortable clothing and clothing appropriate for easy access to any Portacath or PICC line.   We strive to give you quality time with your provider. You may need to reschedule your appointment if you arrive late (15 or more minutes).  Arriving late affects you and other patients whose appointments are after yours.  Also, if you miss three or more appointments without notifying the office, you may be dismissed from the clinic at the provider's discretion.      For prescription refill requests, have your pharmacy contact our office and allow 72 hours for refills to be completed.    Today you received the following chemotherapy and/or immunotherapy agents Darzalex faspro      To help prevent nausea and vomiting after your treatment, we encourage you to take your nausea medication as directed.  BELOW ARE SYMPTOMS THAT SHOULD BE REPORTED IMMEDIATELY: *FEVER GREATER THAN 100.4 F (38 C) OR HIGHER *CHILLS OR SWEATING *NAUSEA AND VOMITING THAT IS NOT CONTROLLED WITH YOUR NAUSEA MEDICATION *UNUSUAL SHORTNESS OF BREATH *UNUSUAL BRUISING OR BLEEDING *URINARY PROBLEMS (pain or burning when urinating, or frequent urination) *BOWEL PROBLEMS (unusual diarrhea, constipation, pain near the anus) TENDERNESS IN MOUTH AND THROAT WITH OR WITHOUT PRESENCE OF ULCERS (sore throat, sores in mouth, or a toothache) UNUSUAL RASH, SWELLING OR PAIN  UNUSUAL VAGINAL DISCHARGE OR ITCHING   Items with * indicate a potential emergency and should be followed up as soon as possible or go to the Emergency Department if any problems should occur.  Please show the CHEMOTHERAPY ALERT CARD or  IMMUNOTHERAPY ALERT CARD at check-in to the Emergency Department and triage nurse.  Should you have questions after your visit or need to cancel or reschedule your appointment, please contact [facility] - A DEPT OF [facility]  Dept: [phone]  and follow the prompts.  Office hours are 8:00 a.m. to 4:30 p.m. Monday - Friday. Please note that voicemails left after 4:00 p.m. may not be returned until the following business day.  We are closed weekends and major holidays. You have access to a nurse at all times for urgent questions. Please call the main number to the clinic Dept: [phone] and follow the prompts.   For any non-urgent questions, you may also contact your provider using MyChart. We now offer e-Visits for anyone 40 and older to request care online for non-urgent symptoms. For details visit [email or web address].   Also download the MyChart app! Go to the app store, search "MyChart", open the app, select [place], and log in with your MyChart username and password.

## 2024-01-24 LAB — KAPPA/LAMBDA LIGHT CHAINS
Kappa free light chain: 27.6 mg/L — ABNORMAL HIGH (ref 3.3–19.4)
Kappa, lambda light chain ratio: 1.07 (ref 0.26–1.65)
Lambda free light chains: 25.8 mg/L (ref 5.7–26.3)

## 2024-01-25 DIAGNOSIS — H4720 Unspecified optic atrophy: Secondary | ICD-10-CM | POA: Diagnosis not present

## 2024-01-26 ENCOUNTER — Other Ambulatory Visit: Payer: Self-pay | Admitting: Hematology

## 2024-01-26 DIAGNOSIS — C9002 Multiple myeloma in relapse: Secondary | ICD-10-CM

## 2024-01-26 LAB — MULTIPLE MYELOMA PANEL, SERUM
Albumin SerPl Elph-Mcnc: 3.4 g/dL (ref 2.9–4.4)
Albumin/Glob SerPl: 1.5 (ref 0.7–1.7)
Alpha 1: 0.2 g/dL (ref 0.0–0.4)
Alpha2 Glob SerPl Elph-Mcnc: 0.6 g/dL (ref 0.4–1.0)
B-Globulin SerPl Elph-Mcnc: 0.8 g/dL (ref 0.7–1.3)
Gamma Glob SerPl Elph-Mcnc: 0.7 g/dL (ref 0.4–1.8)
Globulin, Total: 2.4 g/dL (ref 2.2–3.9)
IgA: 120 mg/dL (ref 61–437)
IgG (Immunoglobin G), Serum: 872 mg/dL (ref 603–1613)
IgM (Immunoglobulin M), Srm: 12 mg/dL — ABNORMAL LOW (ref 15–143)
Total Protein ELP: 5.8 g/dL — ABNORMAL LOW (ref 6.0–8.5)

## 2024-01-29 ENCOUNTER — Encounter: Payer: Self-pay | Admitting: Hematology

## 2024-01-30 ENCOUNTER — Other Ambulatory Visit: Payer: Self-pay

## 2024-02-20 ENCOUNTER — Ambulatory Visit: Payer: Medicare HMO

## 2024-02-20 ENCOUNTER — Inpatient Hospital Stay: Payer: Medicare HMO | Admitting: Hematology

## 2024-02-20 ENCOUNTER — Other Ambulatory Visit: Payer: Medicare HMO

## 2024-02-20 ENCOUNTER — Telehealth: Payer: Self-pay | Admitting: Hematology

## 2024-02-20 ENCOUNTER — Inpatient Hospital Stay: Payer: Medicare HMO

## 2024-02-20 ENCOUNTER — Inpatient Hospital Stay: Payer: Medicare HMO | Attending: Hematology

## 2024-02-20 VITALS — BP 119/72 | HR 60 | Temp 97.5°F | Resp 17 | Wt 173.4 lb

## 2024-02-20 VITALS — BP 130/73 | HR 55 | Temp 97.9°F | Resp 20

## 2024-02-20 DIAGNOSIS — N183 Chronic kidney disease, stage 3 unspecified: Secondary | ICD-10-CM | POA: Diagnosis not present

## 2024-02-20 DIAGNOSIS — J90 Pleural effusion, not elsewhere classified: Secondary | ICD-10-CM | POA: Insufficient documentation

## 2024-02-20 DIAGNOSIS — Z79624 Long term (current) use of inhibitors of nucleotide synthesis: Secondary | ICD-10-CM | POA: Diagnosis not present

## 2024-02-20 DIAGNOSIS — Z79899 Other long term (current) drug therapy: Secondary | ICD-10-CM | POA: Diagnosis not present

## 2024-02-20 DIAGNOSIS — C9002 Multiple myeloma in relapse: Secondary | ICD-10-CM

## 2024-02-20 DIAGNOSIS — G4700 Insomnia, unspecified: Secondary | ICD-10-CM | POA: Insufficient documentation

## 2024-02-20 DIAGNOSIS — M129 Arthropathy, unspecified: Secondary | ICD-10-CM | POA: Diagnosis not present

## 2024-02-20 DIAGNOSIS — R911 Solitary pulmonary nodule: Secondary | ICD-10-CM | POA: Diagnosis not present

## 2024-02-20 DIAGNOSIS — M545 Low back pain, unspecified: Secondary | ICD-10-CM | POA: Insufficient documentation

## 2024-02-20 DIAGNOSIS — Z5112 Encounter for antineoplastic immunotherapy: Secondary | ICD-10-CM | POA: Diagnosis present

## 2024-02-20 DIAGNOSIS — Z7189 Other specified counseling: Secondary | ICD-10-CM

## 2024-02-20 DIAGNOSIS — C9000 Multiple myeloma not having achieved remission: Secondary | ICD-10-CM | POA: Diagnosis not present

## 2024-02-20 DIAGNOSIS — M542 Cervicalgia: Secondary | ICD-10-CM | POA: Diagnosis not present

## 2024-02-20 DIAGNOSIS — I129 Hypertensive chronic kidney disease with stage 1 through stage 4 chronic kidney disease, or unspecified chronic kidney disease: Secondary | ICD-10-CM | POA: Diagnosis not present

## 2024-02-20 DIAGNOSIS — E871 Hypo-osmolality and hyponatremia: Secondary | ICD-10-CM | POA: Diagnosis not present

## 2024-02-20 DIAGNOSIS — J320 Chronic maxillary sinusitis: Secondary | ICD-10-CM | POA: Insufficient documentation

## 2024-02-20 DIAGNOSIS — Z7982 Long term (current) use of aspirin: Secondary | ICD-10-CM | POA: Insufficient documentation

## 2024-02-20 LAB — COMPREHENSIVE METABOLIC PANEL
ALT: 11 U/L (ref 0–44)
AST: 13 U/L — ABNORMAL LOW (ref 15–41)
Albumin: 3.9 g/dL (ref 3.5–5.0)
Alkaline Phosphatase: 51 U/L (ref 38–126)
Anion gap: 4 — ABNORMAL LOW (ref 5–15)
BUN: 14 mg/dL (ref 8–23)
CO2: 30 mmol/L (ref 22–32)
Calcium: 8 mg/dL — ABNORMAL LOW (ref 8.9–10.3)
Chloride: 108 mmol/L (ref 98–111)
Creatinine, Ser: 1.53 mg/dL — ABNORMAL HIGH (ref 0.61–1.24)
GFR, Estimated: 47 mL/min — ABNORMAL LOW (ref >60)
Glucose, Bld: 82 mg/dL (ref 70–99)
Potassium: 3.7 mmol/L (ref 3.5–5.1)
Sodium: 142 mmol/L (ref 135–145)
Total Bilirubin: 0.5 mg/dL (ref 0.0–1.2)
Total Protein: 6.5 g/dL (ref 6.5–8.1)

## 2024-02-20 LAB — CBC WITH DIFFERENTIAL (CANCER CENTER ONLY)
Abs Immature Granulocytes: 0 10*3/uL (ref 0.00–0.07)
Basophils Absolute: 0.1 10*3/uL (ref 0.0–0.1)
Basophils Relative: 1 %
Eosinophils Absolute: 0.4 10*3/uL (ref 0.0–0.5)
Eosinophils Relative: 9 %
HCT: 38.1 % — ABNORMAL LOW (ref 39.0–52.0)
Hemoglobin: 12.2 g/dL — ABNORMAL LOW (ref 13.0–17.0)
Immature Granulocytes: 0 %
Lymphocytes Relative: 39 %
Lymphs Abs: 1.7 10*3/uL (ref 0.7–4.0)
MCH: 28.6 pg (ref 26.0–34.0)
MCHC: 32 g/dL (ref 30.0–36.0)
MCV: 89.4 fL (ref 80.0–100.0)
Monocytes Absolute: 0.6 10*3/uL (ref 0.1–1.0)
Monocytes Relative: 14 %
Neutro Abs: 1.6 10*3/uL — ABNORMAL LOW (ref 1.7–7.7)
Neutrophils Relative %: 37 %
Platelet Count: 141 10*3/uL — ABNORMAL LOW (ref 150–400)
RBC: 4.26 MIL/uL (ref 4.22–5.81)
RDW: 16.6 % — ABNORMAL HIGH (ref 11.5–15.5)
WBC Count: 4.4 10*3/uL (ref 4.0–10.5)
nRBC: 0 % (ref 0.0–0.2)

## 2024-02-20 MED ORDER — DEXAMETHASONE 4 MG PO TABS
12.0000 mg | ORAL_TABLET | Freq: Once | ORAL | Status: AC
Start: 2024-02-20 — End: 2024-02-20
  Administered 2024-02-20: 12 mg via ORAL
  Filled 2024-02-20: qty 3

## 2024-02-20 MED ORDER — DIPHENHYDRAMINE HCL 25 MG PO CAPS
50.0000 mg | ORAL_CAPSULE | Freq: Once | ORAL | Status: AC
Start: 2024-02-20 — End: 2024-02-20
  Administered 2024-02-20: 50 mg via ORAL
  Filled 2024-02-20: qty 2

## 2024-02-20 MED ORDER — DENOSUMAB 120 MG/1.7ML ~~LOC~~ SOLN
120.0000 mg | Freq: Once | SUBCUTANEOUS | Status: AC
  Administered 2024-02-20: 120 mg via SUBCUTANEOUS
  Filled 2024-02-20: qty 1.7

## 2024-02-20 MED ORDER — FAMOTIDINE 20 MG PO TABS
20.0000 mg | ORAL_TABLET | Freq: Once | ORAL | Status: AC
  Administered 2024-02-20: 20 mg via ORAL
  Filled 2024-02-20: qty 1

## 2024-02-20 MED ORDER — ACETAMINOPHEN 325 MG PO TABS
650.0000 mg | ORAL_TABLET | Freq: Once | ORAL | Status: AC
  Administered 2024-02-20: 650 mg via ORAL
  Filled 2024-02-20: qty 2

## 2024-02-20 MED ORDER — DARATUMUMAB-HYALURONIDASE-FIHJ 1800-30000 MG-UT/15ML ~~LOC~~ SOLN
1800.0000 mg | Freq: Once | SUBCUTANEOUS | Status: AC
  Administered 2024-02-20: 1800 mg via SUBCUTANEOUS
  Filled 2024-02-20: qty 15

## 2024-02-20 NOTE — Progress Notes (Signed)
 HEMATOLOGY/ONCOLOGY CLINIC NOTE  Date of Service: 02/20/24    Patient Care Team: Chilton Greathouse, MD as PCP - General (Internal Medicine)  CHIEF COMPLAINTS/PURPOSE OF CONSULTATION:  F/u for continued mx of myeloma  HISTORY OF PRESENTING ILLNESS:  Marcus Hatfield is a wonderful 78 y.o. 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  Patient was seen by me as an inpatient on 05/20/2023.  He noted that his vision may be slightly better with the steroid. Had some mild insomnia but it was not too bothersome. Has been seen by radiation oncology and is going to be set up for CT simulation and to start radiation 05/21/2023.  Did have a CT chest abdomen pelvis without contrast to evaluate for any other soft tissue metastatic disease and whole-body skeletal survey to determine other overt progression and to rule out the possibility of further source of metastatic disease.  Today, he is accompanied by two family members. He reports that he is feeling well overall. He reports that his vision has improved and returned to baseline. He denies any glares in his vision which were previously present. He is able to count fingers and notes improved sight of colors on television.   His p.o. intake is normal and he notes drinking 4 16oz bottles daily. He denies any fevers, chills, night sweats, new back pain, fatigue, posterior neck pain, or abdominal pain.  He reports some nausea with previously taking Revlimid. Patient reports that he has an upcoming dental appointment.  INTERVAL HISTORY:  Marcus Hatfield is a wonderful 78 y.o. male who is here for continued evaluation and management of his multiple myeloma.He is here for cycle 10 day 1 of his treatment.   Patient was last seen by me on 01/23/2024 and he complained of lower back pain, but was dong well overall.   Patient is accompanied by his wife during this visit. Patient notes he has been doing  well overall since our last visit. He does complain of lower back pain, which improves with support. He notes he had intermittent cough in December and January, which has significantly improved.   He denies any new infection issues, fever, chills, night sweats, unexpected weight loss, chest pain, abdominal pain, or leg swelling. He does complain of occasional neck pain, which improves with movement.   Patient notes his last bone marrow biopsy was around 4-5 years ago.   Patient is UTD with the influenza vaccine, COVID-19 Booster, and other age related vaccines.    MEDICAL HISTORY:  Past Medical History:  Diagnosis Date   Anemia    Arthritis    CKD (chronic kidney disease) stage 3, GFR 30-59 ml/min (HCC) 20015   Creat 1.9   Hypertension    Multiple myeloma (HCC) 2016   [facility] heme onc   Prostate disorder 02/2017    SURGICAL HISTORY: Past Surgical History:  Procedure Laterality Date   BIOPSY  06/06/2023   Procedure: BIOPSY;  Surgeon: [name], MD;  Location: [facility] ENDOSCOPY;  Service: Gastroenterology;;   ESOPHAGOGASTRODUODENOSCOPY (EGD) WITH PROPOFOL N/A 06/06/2023   Procedure: ESOPHAGOGASTRODUODENOSCOPY (EGD) WITH PROPOFOL;  Surgeon: [name], MD;  Location: [facility] ENDOSCOPY;  Service: Gastroenterology;  Laterality: N/A;   EYE SURGERY Bilateral 01/30/2018   KNEE SURGERY     over ten years ago   NEPHRECTOMY Right 03/2015   ROBOTIC ASSITED PARTIAL NEPHRECTOMY Left 07/07/2021   Procedure: XI ROBOTIC ASSITED PARTIAL NEPHRECTOMY;  Surgeon: [name], MD;  Location:  [facility] ORS;  Service: Urology;  Laterality: Left;  5 HRS   XI ROBOTIC ASSISTED SIMPLE PROSTATECTOMY N/A 07/07/2021   Procedure: XI ROBOTIC ASSISTED SIMPLE PROSTATECTOMY;  Surgeon: [name], MD;  Location: [facility] ORS;  Service: Urology;  Laterality: N/A;    SOCIAL HISTORY: Social History   Socioeconomic History   Marital status: Married    Spouse name: Museum/gallery curator   Number of children: 2   Years of  education: Not on file   Highest education level: Not on file  Occupational History   Occupation: Retired  Tobacco Use   Smoking status: Never   Smokeless tobacco: Never  Vaping Use   Vaping status: Never Used  Substance and Sexual Activity   Alcohol use: No   Drug use: No   Sexual activity: Not on file  Other Topics Concern   Not on file  Social History Narrative   Not on file   Social Drivers of Health   Financial Resource Strain: Not on file  Food Insecurity: Patient Declined (06/05/2023)   Hunger Vital Sign    Worried About Running Out of Food in the Last Year: Patient declined    Ran Out of Food in the Last Year: Patient declined  Transportation Needs: Patient Declined (06/05/2023)   PRAPARE - Administrator, Civil Service (Medical): Patient declined    Lack of Transportation (Non-Medical): Patient declined  Physical Activity: Not on file  Stress: Not on file  Social Connections: Not on file  Intimate Partner Violence: Patient Declined (06/05/2023)   Humiliation, Afraid, Rape, and Kick questionnaire    Fear of Current or Ex-Partner: Patient declined    Emotionally Abused: Patient declined    Physically Abused: Patient declined    Sexually Abused: Patient declined    FAMILY HISTORY: Family History  Problem Relation Age of Onset   Diabetes Father    Hypertension Brother     ALLERGIES:  is allergic to penicillins, latex, and tape.  MEDICATIONS:  Current Outpatient Medications  Medication Sig Dispense Refill   acyclovir (ZOVIRAX) 400 MG tablet Take 1 tablet (400 mg total) by mouth 2 (two) times daily. 60 tablet 11   amLODipine (NORVASC) 10 MG tablet Take 0.5 tablets (5 mg total) by mouth daily.     aspirin 81 MG chewable tablet Chew 81 mg by mouth daily.     carvedilol (COREG) 6.25 MG tablet Take 1 tablet (6.25 mg total) by mouth 2 (two) times daily with a meal.     diclofenac Sodium (VOLTAREN) 1 % GEL Apply 2 g topically daily as needed (for pain).      docusate sodium (COLACE) 100 MG capsule Take 1 capsule (100 mg total) by mouth daily as needed for mild constipation.     ferrous sulfate 325 (65 FE) MG tablet Take 1 tablet (325 mg total) by mouth 2 (two) times daily as needed (iron).     finasteride (PROSCAR) 5 MG tablet Take 5 mg by mouth daily.     fluticasone (FLONASE) 50 MCG/ACT nasal spray Place 2 sprays into both nostrils daily as needed for allergies.     furosemide (LASIX) 20 MG tablet Take 0.5 tablets (10 mg total) by mouth every other day. 30 tablet    lactulose (CHRONULAC) 10 GM/15ML solution Take 10 g by mouth daily as needed for constipation.  5   lenalidomide (REVLIMID) 10 MG capsule TAKE 1 CAPSULE BY MOUTH 1 TIME A DAY FOR 21 DAYS ON THEN 7 DAYS OFF 21 capsule  0   ondansetron (ZOFRAN) 8 MG tablet Take 1 tablet (8 mg total) by mouth every 8 (eight) hours as needed for nausea or vomiting. 30 tablet 1   oxyCODONE-acetaminophen (PERCOCET/ROXICET) 5-325 MG tablet Take 0.5-1 tablets by mouth every 6 (six) hours as needed for severe pain (pain score 7-10). 20 tablet 0   pantoprazole (PROTONIX) 40 MG tablet Take 1 tablet (40 mg total) by mouth 2 (two) times daily. Take bid x 10 weeks, followed by daily thereafter 90 tablet 3   prochlorperazine (COMPAZINE) 10 MG tablet Take 1 tablet (10 mg total) by mouth every 6 (six) hours as needed for nausea or vomiting. 30 tablet 1   tamsulosin (FLOMAX) 0.4 MG CAPS capsule Take 1 capsule (0.4 mg total) by mouth daily after supper. 30 capsule 1   telmisartan (MICARDIS) 80 MG tablet Take 80 mg by mouth daily.     No current facility-administered medications for this visit.    REVIEW OF SYSTEMS:    .10 Point review of Systems was done is negative except as noted above.  PHYSICAL EXAMINATION: ECOG PERFORMANCE STATUS: 2 - Symptomatic, <50% confined to bed .BP 119/72 (BP Location: Left Arm, Patient Position: Sitting)   Pulse 60   Temp (!) 97.5 F (36.4 C) (Oral)   Resp 17   Wt 173 lb 6.4 oz  (78.7 kg)   SpO2 100%   BMI 24.88 kg/m  . GENERAL:alert, in no acute distress and comfortable SKIN: no acute rashes, no significant lesions EYES: conjunctiva are pink and non-injected, sclera anicteric OROPHARYNX: MMM, no exudates, no oropharyngeal erythema or ulceration NECK: supple, no JVD LYMPH:  no palpable lymphadenopathy in the cervical, axillary or inguinal regions LUNGS: clear to auscultation b/l with normal respiratory effort HEART: regular rate & rhythm ABDOMEN:  normoactive bowel sounds , non tender, not distended. Extremity: no pedal edema PSYCH: alert & oriented x 3 with fluent speech NEURO: no focal motor/sensory deficits    LABORATORY DATA:  I have reviewed the data as listed .    Latest Ref Rng & Units 02/20/2024    8:42 AM 01/23/2024    1:32 PM 12/26/2023   12:04 PM  CBC  WBC 4.0 - 10.5 K/uL 4.4  4.1  3.8   Hemoglobin 13.0 - 17.0 g/dL 45.4  09.8  11.9   Hematocrit 39.0 - 52.0 % 38.1  37.2  37.0   Platelets 150 - 400 K/uL 141  138  184    .    Latest Ref Rng & Units 02/20/2024    8:42 AM 01/23/2024    1:32 PM 12/26/2023   12:04 PM  CMP  Glucose 70 - 99 mg/dL 82  99  79   BUN 8 - 23 mg/dL 14  14  13    Creatinine 0.61 - 1.24 mg/dL 1.47  8.29  5.62   Sodium 135 - 145 mmol/L 142  142  141   Potassium 3.5 - 5.1 mmol/L 3.7  3.5  3.7   Chloride 98 - 111 mmol/L 108  110  107   CO2 22 - 32 mmol/L 30  28  29    Calcium 8.9 - 10.3 mg/dL 8.0  8.1  8.3   Total Protein 6.5 - 8.1 g/dL 6.5  6.3  6.7   Total Bilirubin 0.0 - 1.2 mg/dL 0.5  0.4  0.5   Alkaline Phos 38 - 126 U/L 51  54  57   AST 15 - 41 U/L 13  13  12    ALT  0 - 44 U/L 11  12  11       RADIOGRAPHIC STUDIES: I have personally reviewed the radiological images as listed and agreed with the findings in the report. No results found.   ASSESSMENT & PLAN:   78year-old male with   #1 Progressive IgG kappa myeloma: Initially diagnosed in 2016 as stage II disease with standard risk cytogenetics today. Evidence  of AL amyloidosis on the bone marrow done previously. Status post treatment as noted above. Last seen by Dr. Lalla Brothers at [facility] in January 2024 and noted to have progressive disease with increase in his IgG kappa M spike at 2.87 g/dL.   Recent light chains done on 05/20/2023 at [facility] showed increase in free kappa light chains to 603 with a kappa lambda ratio of 25 this is up from kappa light chains of 402 with a kappa lambda ratio of 12.6 on 01/03/2023. CBC shows relatively stable hemoglobin of 11.3 with normal WBC count and platelets BMP shows stable chronic kidney disease with a creatinine of 1.87.  No overt hypercalcemia with a calcium of 9   #2 recent right clavicular pain with x-ray on 05/06/2023 showing mildly displaced likely pathologic fracture of the medial right clavicle.  Multiple other lytic lesions noted.   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  CT head done today 05/20/2023 shows  Large mass at the skull base, eroding and expanding the clivus, also involving the sphenoid bone and the petrous portions of both temporal bones. The mass broadly abuts both optic nerves between the chiasm and orbital apices. Mass also broadly involves both cavernous sinuses. 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 Recommend further assessment with brain MRI without and with contrast.   MRI brain 05/20/2023 1. 7.8 x 5.6 x 3.3 cm enhancing mass lesion at the skull base extending along the petrous ridge bilaterally, greater on the right. 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 There are some intrinsic T2 signal scratched at there is some intrinsic T2 signal and chordoma still considered. However, given the multiple other enhancing lesions throughout the skull, this most likely represents metastatic disease. 2. No acute  intracranial abnormality. 3. Mild atrophy and white matter changes are likely within normal limits for age. 4. Right mastoid effusion secondary to obstruction of the eustachian tube. 5. Minimal right maxillary sinus disease.   ADDENDUM: Lytic lesions have been identified previously with this patient. 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   #4 hyponatremia   #5 history of renal cell carcinoma right nephrectomy on 02/03/2016.  He received cryotherapy to his left renal mass in April 2017. Surgically resected by Dr. [name] at [facility] on July 07, 2021. 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 Tumor is limited to the kidney (pT1a).   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 CBC shows slightly low Hgb of 12.2 g/dL with Hct of 16.1% and low platelets of 141 K. CMP shows elevated creatinine of 1.53 and low calcium level of 8.0.  -Discussed PET scan results from 12/22/2023 in detail with the patient. Showed Persistent mild hypometabolism involving lytic lesions  throughout the visualized osseous structures, stable and compatible with multiple myeloma. New area of hypometabolic consolidation/volume loss in the apical segment right upper lobe, possibly infectious/postinfectious in etiology or related to interval therapy. Recommend attention on follow-up. Small borderline hypermetabolic subcarinal lymph node with minimal bihilar hypermetabolism, possibly reactive in etiology. Again, recommend attention on follow-up. Small right pleural effusion. -Discussed with the patient that the right upper love inflammation on PET scan looks mostly related it to infection. Discussed the option of CT scan in 2-3 weeks before our next visit in a month. Pt agrees.   -Schedule patient for CT chest scan in 2-3 weeks.  -Patient has been tolerating his maintenance Daratumumab treatment well without any new or severe toxicities.  -Patient has been tolerating his current dose of Revlimid 10 mg well without any new or severe toxicities.  -Pt can proceed with his maintenance Daratumumab treatment without dose modifications.  -Pt can continue Revlimid 10 mg. -Answered all of patient's questions.   FOLLOW-UP: Ct chest in 2-3 weeks Continue q4weekly Dara per integrated scheduling with labs and MD visit  The total time spent in the appointment was 30 minutes* .  All of the patient's questions were answered with apparent satisfaction. The patient knows to call the clinic with any problems, questions or concerns.   Wyvonnia Lora MD MS AAHIVMS [facility] Hematology/Oncology Physician [facility]  .*Total Encounter Time as defined by the Centers for Medicare and Medicaid Services includes, in addition to the face-to-face time of a patient visit (documented in the note above) non-face-to-face time: obtaining and reviewing outside history, ordering and reviewing medications, tests or procedures, care coordination (communications with other health care professionals or caregivers) and documentation in the medical record.   I,Param Shah,acting as a Neurosurgeon for Wyvonnia Lora, MD.,have documented all relevant documentation on the behalf of Wyvonnia Lora, MD,as directed by  Wyvonnia Lora, MD while in the presence of Wyvonnia Lora, MD.  .I have reviewed the above documentation for accuracy and completeness, and I agree with the above. [name] MD

## 2024-02-20 NOTE — Telephone Encounter (Signed)
 Received a message to call patient to confirm appointments. Patient answered the first attempt but stopped responding when I stated where I was calling from. On the second attempt the patient sent my call to voicemail.

## 2024-02-20 NOTE — Progress Notes (Signed)
 Per Dr. Candise Che, ok to give [facility] today with Corrected Calcium 8.08 mg/dL.

## 2024-02-20 NOTE — Patient Instructions (Addendum)
 [facility] - A DEPT OF [facility]  Discharge Instructions: Thank you for choosing [facility] to provide your oncology and hematology care.   If you have a lab appointment with the Cancer Center, please go directly to the Cancer Center and check in at the registration area.   Wear comfortable clothing and clothing appropriate for easy access to any Portacath or PICC line.   We strive to give you quality time with your provider. You may need to reschedule your appointment if you arrive late (15 or more minutes).  Arriving late affects you and other patients whose appointments are after yours.  Also, if you miss three or more appointments without notifying the office, you may be dismissed from the clinic at the provider's discretion.      For prescription refill requests, have your pharmacy contact our office and allow 72 hours for refills to be completed.    Today you received the following chemotherapy and/or immunotherapy agent: Daratumumab Hyaluronidase (Darzalex Faspro)      To help prevent nausea and vomiting after your treatment, we encourage you to take your nausea medication as directed.  BELOW ARE SYMPTOMS THAT SHOULD BE REPORTED IMMEDIATELY: *FEVER GREATER THAN 100.4 F (38 C) OR HIGHER *CHILLS OR SWEATING *NAUSEA AND VOMITING THAT IS NOT CONTROLLED WITH YOUR NAUSEA MEDICATION *UNUSUAL SHORTNESS OF BREATH *UNUSUAL BRUISING OR BLEEDING *URINARY PROBLEMS (pain or burning when urinating, or frequent urination) *BOWEL PROBLEMS (unusual diarrhea, constipation, pain near the anus) TENDERNESS IN MOUTH AND THROAT WITH OR WITHOUT PRESENCE OF ULCERS (sore throat, sores in mouth, or a toothache) UNUSUAL RASH, SWELLING OR PAIN  UNUSUAL VAGINAL DISCHARGE OR ITCHING   Items with * indicate a potential emergency and should be followed up as soon as possible or go to the Emergency Department if any problems should occur.  Please show the  CHEMOTHERAPY ALERT CARD or IMMUNOTHERAPY ALERT CARD at check-in to the Emergency Department and triage nurse.  Should you have questions after your visit or need to cancel or reschedule your appointment, please contact [facility] - [facility]  Dept: [phone]  and follow the prompts.  Office hours are 8:00 a.m. to 4:30 p.m. Monday - Friday. Please note that voicemails left after 4:00 p.m. may not be returned until the following business day.  We are closed weekends and major holidays. You have access to a nurse at all times for urgent questions. Please call the main number to the clinic Dept: [phone] and follow the prompts.   For any non-urgent questions, you may also contact your provider using MyChart. We now offer e-Visits for anyone 8 and older to request care online for non-urgent symptoms. For details visit [email or web address].   Also download the MyChart app! Go to the app store, search "MyChart", open the app, select [facility], and log in with your MyChart username and password.  Daratumumab; Hyaluronidase Injection What is this medication? DARATUMUMAB; HYALURONIDASE (dar a toom ue mab; hye al ur ON i dase) treats multiple myeloma, a type of bone marrow cancer. Daratumumab works by blocking a protein that causes cancer cells to grow and multiply. This helps to slow or stop the spread of cancer cells. Hyaluronidase works by increasing the absorption of other medications in the body to help them work better. This medication may also be used treat amyloidosis, a condition that causes the buildup of a protein (amyloid)  in your body. It works by reducing the buildup of this protein, which decreases symptoms. It is a combination medication that contains a monoclonal antibody. This medicine may be used for other purposes; ask your health care provider or pharmacist if you have questions. COMMON BRAND NAME(S): DARZALEX FASPRO What should I tell  my care team before I take this medication? They need to know if you have any of these conditions: Heart disease Infection, such as chickenpox, cold sores, herpes, hepatitis B Lung or breathing disease An unusual or allergic reaction to daratumumab, hyaluronidase, other medications, foods, dyes, or preservatives Pregnant or trying to get pregnant Breast-feeding How should I use this medication? This medication is injected under the skin. It is given by your care team in a hospital or clinic setting. Talk to your care team about the use of this medication in children. Special care may be needed. Overdosage: If you think you have taken too much of this medicine contact a poison control center or emergency room at once. NOTE: This medicine is only for you. Do not share this medicine with others. What if I miss a dose? Keep appointments for follow-up doses. It is important not to miss your dose. Call your care team if you are unable to keep an appointment. What may interact with this medication? Interactions have not been studied. This list may not describe all possible interactions. Give your health care provider a list of all the medicines, herbs, non-prescription drugs, or dietary supplements you use. Also tell them if you smoke, drink alcohol, or use illegal drugs. Some items may interact with your medicine. What should I watch for while using this medication? Your condition will be monitored carefully while you are receiving this medication. This medication can cause serious allergic reactions. To reduce your risk, your care team may give you other medication to take before receiving this one. Be sure to follow the directions from your care team. This medication can affect the results of blood tests to match your blood type. These changes can last for up to 6 months after the final dose. Your care team will do blood tests to match your blood type before you start treatment. Tell all of your  care team that you are being treated with this medication before receiving a blood transfusion. This medication can affect the results of some tests used to determine treatment response; extra tests may be needed to evaluate response. Talk to your care team if you wish to become pregnant or think you are pregnant. This medication can cause serious birth defects if taken during pregnancy and for 3 months after the last dose. A reliable form of contraception is recommended while taking this medication and for 3 months after the last dose. Talk to your care team about effective forms of contraception. Do not breast-feed while taking this medication. What side effects may I notice from receiving this medication? Side effects that you should report to your care team as soon as possible: Allergic reactions--skin rash, itching, hives, swelling of the face, lips, tongue, or throat Heart rhythm changes--fast or irregular heartbeat, dizziness, feeling faint or lightheaded, chest pain, trouble breathing Infection--fever, chills, cough, sore throat, wounds that don't heal, pain or trouble when passing urine, general feeling of discomfort or being unwell Infusion reactions--chest pain, shortness of breath or trouble breathing, feeling faint or lightheaded Sudden eye pain or change in vision such as blurry vision, seeing halos around lights, vision loss Unusual bruising or bleeding Side effects that  usually do not require medical attention (report to your care team if they continue or are bothersome): Constipation Diarrhea Fatigue Nausea Pain, tingling, or numbness in the hands or feet Swelling of the ankles, hands, or feet This list may not describe all possible side effects. Call your doctor for medical advice about side effects. You may report side effects to FDA at 1-800-FDA-1088. Where should I keep my medication? This medication is given in a hospital or clinic. It will not be stored at home. NOTE:  This sheet is a summary. It may not cover all possible information. If you have questions about this medicine, talk to your doctor, pharmacist, or health care provider.  2024 Elsevier/Gold Standard (2022-04-12 00:00:00)  Denosumab Injection (Oncology) What is this medication? DENOSUMAB (den oh SUE mab) prevents weakened bones caused by cancer. It may also be used to treat noncancerous bone tumors that cannot be removed by surgery. It can also be used to treat high calcium levels in the blood caused by cancer. It works by blocking a protein that causes bones to break down quickly. This slows down the release of calcium from bones, which lowers calcium levels in your blood. It also makes your bones stronger and less likely to break (fracture). This medicine may be used for other purposes; ask your health care provider or pharmacist if you have questions. COMMON BRAND NAME(S): XGEVA What should I tell my care team before I take this medication? They need to know if you have any of these conditions: Dental disease Having surgery or tooth extraction Infection Kidney disease Low levels of calcium or vitamin D in the blood Malnutrition On hemodialysis Skin conditions or sensitivity Thyroid or parathyroid disease An unusual reaction to denosumab, other medications, foods, dyes, or preservatives Pregnant or trying to get pregnant Breast-feeding How should I use this medication? This medication is for injection under the skin. It is given by your care team in a hospital or clinic setting. A special MedGuide will be given to you before each treatment. Be sure to read this information carefully each time. Talk to your care team about the use of this medication in children. While it may be prescribed for children as young as 13 years for selected conditions, precautions do apply. Overdosage: If you think you have taken too much of this medicine contact a poison control center or emergency room at  once. NOTE: This medicine is only for you. Do not share this medicine with others. What if I miss a dose? Keep appointments for follow-up doses. It is important not to miss your dose. Call your care team if you are unable to keep an appointment. What may interact with this medication? Do not take this medication with any of the following: Other medications containing denosumab This medication may also interact with the following: Medications that lower your chance of fighting infection Steroid medications, such as prednisone or cortisone This list may not describe all possible interactions. Give your health care provider a list of all the medicines, herbs, non-prescription drugs, or dietary supplements you use. Also tell them if you smoke, drink alcohol, or use illegal drugs. Some items may interact with your medicine. What should I watch for while using this medication? Your condition will be monitored carefully while you are receiving this medication. You may need blood work while taking this medication. This medication may increase your risk of getting an infection. Call your care team for advice if you get a fever, chills, sore throat, or other  symptoms of a cold or flu. Do not treat yourself. Try to avoid being around people who are sick. You should make sure you get enough calcium and vitamin D while you are taking this medication, unless your care team tells you not to. Discuss the foods you eat and the vitamins you take with your care team. Some people who take this medication have severe bone, joint, or muscle pain. This medication may also increase your risk for jaw problems or a broken thigh bone. Tell your care team right away if you have severe pain in your jaw, bones, joints, or muscles. Tell your care team if you have any pain that does not go away or that gets worse. Talk to your care team if you may be pregnant. Serious birth defects can occur if you take this medication during  pregnancy and for 5 months after the last dose. You will need a negative pregnancy test before starting this medication. Contraception is recommended while taking this medication and for 5 months after the last dose. Your care team can help you find the option that works for you. What side effects may I notice from receiving this medication? Side effects that you should report to your care team as soon as possible: Allergic reactions--skin rash, itching, hives, swelling of the face, lips, tongue, or throat Bone, joint, or muscle pain Low calcium level--muscle pain or cramps, confusion, tingling, or numbness in the hands or feet Osteonecrosis of the jaw--pain, swelling, or redness in the mouth, numbness of the jaw, poor healing after dental work, unusual discharge from the mouth, visible bones in the mouth Side effects that usually do not require medical attention (report to your care team if they continue or are bothersome): Cough Diarrhea Fatigue Headache Nausea This list may not describe all possible side effects. Call your doctor for medical advice about side effects. You may report side effects to FDA at 1-800-FDA-1088. Where should I keep my medication? This medication is given in a hospital or clinic. It will not be stored at home. NOTE: This sheet is a summary. It may not cover all possible information. If you have questions about this medicine, talk to your doctor, pharmacist, or health care provider.  2024 Elsevier/Gold Standard (2022-04-27 00:00:00)

## 2024-02-21 ENCOUNTER — Other Ambulatory Visit: Payer: Self-pay

## 2024-02-21 LAB — KAPPA/LAMBDA LIGHT CHAINS
Kappa free light chain: 30.4 mg/L — ABNORMAL HIGH (ref 3.3–19.4)
Kappa, lambda light chain ratio: 1.15 (ref 0.26–1.65)
Lambda free light chains: 26.5 mg/L — ABNORMAL HIGH (ref 5.7–26.3)

## 2024-02-26 ENCOUNTER — Other Ambulatory Visit: Payer: Self-pay | Admitting: Hematology

## 2024-02-26 ENCOUNTER — Encounter: Payer: Self-pay | Admitting: Hematology

## 2024-02-26 DIAGNOSIS — C9002 Multiple myeloma in relapse: Secondary | ICD-10-CM

## 2024-02-26 LAB — MULTIPLE MYELOMA PANEL, SERUM
Albumin SerPl Elph-Mcnc: 3.4 g/dL (ref 2.9–4.4)
Albumin/Glob SerPl: 1.4 (ref 0.7–1.7)
Alpha 1: 0.2 g/dL (ref 0.0–0.4)
Alpha2 Glob SerPl Elph-Mcnc: 0.7 g/dL (ref 0.4–1.0)
B-Globulin SerPl Elph-Mcnc: 0.8 g/dL (ref 0.7–1.3)
Gamma Glob SerPl Elph-Mcnc: 0.8 g/dL (ref 0.4–1.8)
Globulin, Total: 2.6 g/dL (ref 2.2–3.9)
IgA: 116 mg/dL (ref 61–437)
IgG (Immunoglobin G), Serum: 862 mg/dL (ref 603–1613)
IgM (Immunoglobulin M), Srm: 10 mg/dL — ABNORMAL LOW (ref 15–143)
Total Protein ELP: 6 g/dL (ref 6.0–8.5)

## 2024-02-27 ENCOUNTER — Ambulatory Visit (HOSPITAL_COMMUNITY)
Admission: RE | Admit: 2024-02-27 | Discharge: 2024-02-27 | Disposition: A | Discharge status: Home / Self Care | Source: Ambulatory Visit | Attending: Hematology | Admitting: Hematology

## 2024-02-27 DIAGNOSIS — R918 Other nonspecific abnormal finding of lung field: Secondary | ICD-10-CM | POA: Diagnosis not present

## 2024-02-27 DIAGNOSIS — C9000 Multiple myeloma not having achieved remission: Secondary | ICD-10-CM | POA: Diagnosis not present

## 2024-02-27 DIAGNOSIS — R911 Solitary pulmonary nodule: Secondary | ICD-10-CM | POA: Diagnosis not present

## 2024-02-27 DIAGNOSIS — J984 Other disorders of lung: Secondary | ICD-10-CM | POA: Diagnosis not present

## 2024-02-29 ENCOUNTER — Other Ambulatory Visit: Payer: Self-pay | Admitting: Hematology

## 2024-02-29 DIAGNOSIS — C9002 Multiple myeloma in relapse: Secondary | ICD-10-CM

## 2024-02-29 DIAGNOSIS — Z7189 Other specified counseling: Secondary | ICD-10-CM

## 2024-03-19 ENCOUNTER — Inpatient Hospital Stay: Payer: Medicare HMO

## 2024-03-19 ENCOUNTER — Inpatient Hospital Stay: Payer: Medicare HMO | Attending: Hematology

## 2024-03-19 ENCOUNTER — Inpatient Hospital Stay (HOSPITAL_BASED_OUTPATIENT_CLINIC_OR_DEPARTMENT_OTHER): Payer: Medicare HMO | Admitting: Hematology

## 2024-03-19 ENCOUNTER — Other Ambulatory Visit: Payer: Self-pay

## 2024-03-19 VITALS — BP 137/77 | HR 55 | Temp 97.9°F | Resp 17 | Wt 174.3 lb

## 2024-03-19 DIAGNOSIS — Z79624 Long term (current) use of inhibitors of nucleotide synthesis: Secondary | ICD-10-CM | POA: Insufficient documentation

## 2024-03-19 DIAGNOSIS — Z905 Acquired absence of kidney: Secondary | ICD-10-CM | POA: Insufficient documentation

## 2024-03-19 DIAGNOSIS — Z5111 Encounter for antineoplastic chemotherapy: Secondary | ICD-10-CM | POA: Diagnosis not present

## 2024-03-19 DIAGNOSIS — Z85528 Personal history of other malignant neoplasm of kidney: Secondary | ICD-10-CM | POA: Diagnosis not present

## 2024-03-19 DIAGNOSIS — I129 Hypertensive chronic kidney disease with stage 1 through stage 4 chronic kidney disease, or unspecified chronic kidney disease: Secondary | ICD-10-CM | POA: Diagnosis not present

## 2024-03-19 DIAGNOSIS — J320 Chronic maxillary sinusitis: Secondary | ICD-10-CM | POA: Diagnosis not present

## 2024-03-19 DIAGNOSIS — Z7982 Long term (current) use of aspirin: Secondary | ICD-10-CM | POA: Insufficient documentation

## 2024-03-19 DIAGNOSIS — E871 Hypo-osmolality and hyponatremia: Secondary | ICD-10-CM | POA: Insufficient documentation

## 2024-03-19 DIAGNOSIS — G4700 Insomnia, unspecified: Secondary | ICD-10-CM | POA: Diagnosis not present

## 2024-03-19 DIAGNOSIS — M1990 Unspecified osteoarthritis, unspecified site: Secondary | ICD-10-CM | POA: Diagnosis not present

## 2024-03-19 DIAGNOSIS — N183 Chronic kidney disease, stage 3 unspecified: Secondary | ICD-10-CM | POA: Diagnosis not present

## 2024-03-19 DIAGNOSIS — M954 Acquired deformity of chest and rib: Secondary | ICD-10-CM | POA: Diagnosis not present

## 2024-03-19 DIAGNOSIS — C9000 Multiple myeloma not having achieved remission: Secondary | ICD-10-CM | POA: Insufficient documentation

## 2024-03-19 DIAGNOSIS — Z7189 Other specified counseling: Secondary | ICD-10-CM

## 2024-03-19 DIAGNOSIS — G8929 Other chronic pain: Secondary | ICD-10-CM | POA: Insufficient documentation

## 2024-03-19 DIAGNOSIS — Z79899 Other long term (current) drug therapy: Secondary | ICD-10-CM | POA: Diagnosis not present

## 2024-03-19 DIAGNOSIS — Z5112 Encounter for antineoplastic immunotherapy: Secondary | ICD-10-CM | POA: Diagnosis present

## 2024-03-19 DIAGNOSIS — M545 Low back pain, unspecified: Secondary | ICD-10-CM | POA: Diagnosis not present

## 2024-03-19 DIAGNOSIS — C9002 Multiple myeloma in relapse: Secondary | ICD-10-CM

## 2024-03-19 LAB — COMPREHENSIVE METABOLIC PANEL WITH GFR
ALT: 13 U/L (ref 0–44)
AST: 21 U/L (ref 15–41)
Albumin: 4 g/dL (ref 3.5–5.0)
Alkaline Phosphatase: 47 U/L (ref 38–126)
Anion gap: 6 (ref 5–15)
BUN: 17 mg/dL (ref 8–23)
CO2: 28 mmol/L (ref 22–32)
Calcium: 8.5 mg/dL — ABNORMAL LOW (ref 8.9–10.3)
Chloride: 109 mmol/L (ref 98–111)
Creatinine, Ser: 1.51 mg/dL — ABNORMAL HIGH (ref 0.61–1.24)
GFR, Estimated: 47 mL/min — ABNORMAL LOW (ref >60)
Glucose, Bld: 89 mg/dL (ref 70–99)
Potassium: 3.7 mmol/L (ref 3.5–5.1)
Sodium: 143 mmol/L (ref 135–145)
Total Bilirubin: 0.5 mg/dL (ref 0.0–1.2)
Total Protein: 6.5 g/dL (ref 6.5–8.1)

## 2024-03-19 LAB — CBC WITH DIFFERENTIAL (CANCER CENTER ONLY)
Abs Immature Granulocytes: 0.01 10*3/uL (ref 0.00–0.07)
Basophils Absolute: 0.1 10*3/uL (ref 0.0–0.1)
Basophils Relative: 2 %
Eosinophils Absolute: 0.4 10*3/uL (ref 0.0–0.5)
Eosinophils Relative: 9 %
HCT: 38.1 % — ABNORMAL LOW (ref 39.0–52.0)
Hemoglobin: 12.3 g/dL — ABNORMAL LOW (ref 13.0–17.0)
Immature Granulocytes: 0 %
Lymphocytes Relative: 39 %
Lymphs Abs: 1.9 10*3/uL (ref 0.7–4.0)
MCH: 29.1 pg (ref 26.0–34.0)
MCHC: 32.3 g/dL (ref 30.0–36.0)
MCV: 90.1 fL (ref 80.0–100.0)
Monocytes Absolute: 0.6 10*3/uL (ref 0.1–1.0)
Monocytes Relative: 13 %
Neutro Abs: 1.8 10*3/uL (ref 1.7–7.7)
Neutrophils Relative %: 37 %
Platelet Count: 106 10*3/uL — ABNORMAL LOW (ref 150–400)
RBC: 4.23 MIL/uL (ref 4.22–5.81)
RDW: 16.1 % — ABNORMAL HIGH (ref 11.5–15.5)
WBC Count: 4.8 10*3/uL (ref 4.0–10.5)
nRBC: 0 % (ref 0.0–0.2)

## 2024-03-19 MED ORDER — DEXAMETHASONE 4 MG PO TABS
12.0000 mg | ORAL_TABLET | Freq: Once | ORAL | Status: AC
Start: 2024-03-19 — End: 2024-03-19
  Administered 2024-03-19: 12 mg via ORAL
  Filled 2024-03-19: qty 3

## 2024-03-19 MED ORDER — DENOSUMAB 120 MG/1.7ML ~~LOC~~ SOLN
120.0000 mg | Freq: Once | SUBCUTANEOUS | Status: AC
  Administered 2024-03-19: 120 mg via SUBCUTANEOUS

## 2024-03-19 MED ORDER — DIPHENHYDRAMINE HCL 25 MG PO CAPS
50.0000 mg | ORAL_CAPSULE | Freq: Once | ORAL | Status: AC
  Administered 2024-03-19: 50 mg via ORAL
  Filled 2024-03-19: qty 2

## 2024-03-19 MED ORDER — DARATUMUMAB-HYALURONIDASE-FIHJ 1800-30000 MG-UT/15ML ~~LOC~~ SOLN
1800.0000 mg | Freq: Once | SUBCUTANEOUS | Status: AC
  Administered 2024-03-19: 1800 mg via SUBCUTANEOUS
  Filled 2024-03-19: qty 15

## 2024-03-19 MED ORDER — FAMOTIDINE 20 MG PO TABS
20.0000 mg | ORAL_TABLET | Freq: Once | ORAL | Status: AC
  Administered 2024-03-19: 20 mg via ORAL
  Filled 2024-03-19: qty 1

## 2024-03-19 MED ORDER — ACETAMINOPHEN 325 MG PO TABS
650.0000 mg | ORAL_TABLET | Freq: Once | ORAL | Status: AC
  Administered 2024-03-19: 650 mg via ORAL
  Filled 2024-03-19: qty 2

## 2024-03-19 NOTE — Addendum Note (Signed)
 Addended by: Jacki Cones F on: 03/19/2024 01:50 PM   Modules accepted: Orders

## 2024-03-19 NOTE — Addendum Note (Signed)
 Addended by: Jacki Cones F on: 03/19/2024 01:51 PM   Modules accepted: Orders

## 2024-03-19 NOTE — Progress Notes (Signed)
 Patient seen by Dr. Addison Naegeli are within treatment parameters.  Labs reviewed: and are not all within treatment parameters.   Dr Candise Che aware CR: 1.51, Plts: 106  Per physician team, patient is ready for treatment and there are NO modifications to the treatment plan.

## 2024-03-19 NOTE — Progress Notes (Signed)
 Ok to proceed with Rivka Barbara today with Ca 8.5 per Dr. Candise Che.  Drusilla Kanner, PharmD, MBA

## 2024-03-19 NOTE — Progress Notes (Signed)
 HEMATOLOGY/ONCOLOGY CLINIC NOTE  Date of Service: 03/19/24    Patient Care Team: Chilton Greathouse, MD as PCP - General (Internal Medicine)  CHIEF COMPLAINTS/PURPOSE OF CONSULTATION:  F/u for continued mx of myeloma  HISTORY OF PRESENTING ILLNESS:  Marcus Hatfield is a wonderful 78 y.o. 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  Patient was seen by me as an inpatient on 05/20/2023.  He noted that his vision may be slightly better with the steroid. Had some mild insomnia but it was not too bothersome. Has been seen by radiation oncology and is going to be set up for CT simulation and to start radiation 05/21/2023.  Did have a CT chest abdomen pelvis without contrast to evaluate for any other soft tissue metastatic disease and whole-body skeletal survey to determine other overt progression and to rule out the possibility of further source of metastatic disease.  Today, he is accompanied by two family members. He reports that he is feeling well overall. He reports that his vision has improved and returned to baseline. He denies any glares in his vision which were previously present. He is able to count fingers and notes improved sight of colors on television.   His p.o. intake is normal and he notes drinking 4 16oz bottles daily. He denies any fevers, chills, night sweats, new back pain, fatigue, posterior neck pain, or abdominal pain.  He reports some nausea with previously taking Revlimid. Patient reports that he has an upcoming dental appointment.  INTERVAL HISTORY:  Marcus Hatfield is a wonderful 78 y.o. male who is here for continued evaluation and management of his multiple myeloma.He is here for cycle 11 day 1 of his treatment.   Patient was last seen by me on 02/20/2024 and he complained of lower back pain and occasional neck pain.  Patient is accompanied by his wife during this visit. Patient notes he has been doing well  overall since our last visit. He denies any new infection issues, fever, chills, night sweats, unexpected weight loss, chest pain, abdominal pain, or leg swelling. He does complain of chronic lower back pain, due arthritis, in the morning.  He has been eating well and has been staying well-hydrated.   Patient has been tolerating his maintenance Daratumumab infusions and his current Revlimid dosage well without any new or severe toxicities.   MEDICAL HISTORY:  Past Medical History:  Diagnosis Date   Anemia    Arthritis    CKD (chronic kidney disease) stage 3, GFR 30-59 ml/min (HCC) 20015   Creat 1.9   Hypertension    Multiple myeloma (HCC) 2016   [facility] heme onc   Prostate disorder 02/2017    SURGICAL HISTORY: Past Surgical History:  Procedure Laterality Date   BIOPSY  06/06/2023   Procedure: BIOPSY;  Surgeon: [name], MD;  Location: [facility] ENDOSCOPY;  Service: Gastroenterology;;   ESOPHAGOGASTRODUODENOSCOPY (EGD) WITH PROPOFOL N/A 06/06/2023   Procedure: ESOPHAGOGASTRODUODENOSCOPY (EGD) WITH PROPOFOL;  Surgeon: [name], MD;  Location: [facility] ENDOSCOPY;  Service: Gastroenterology;  Laterality: N/A;   EYE SURGERY Bilateral 01/30/2018   KNEE SURGERY     over ten years ago   NEPHRECTOMY Right 03/2015   ROBOTIC ASSITED PARTIAL NEPHRECTOMY Left 07/07/2021   Procedure: XI ROBOTIC ASSITED PARTIAL NEPHRECTOMY;  Surgeon: [name], MD;  Location: [facility] ORS;  Service: Urology;  Laterality: Left;  5 HRS   XI ROBOTIC ASSISTED SIMPLE PROSTATECTOMY N/A 07/07/2021   Procedure: XI ROBOTIC  ASSISTED SIMPLE PROSTATECTOMY;  Surgeon: [name], MD;  Location: [facility] ORS;  Service: Urology;  Laterality: N/A;    SOCIAL HISTORY: Social History   Socioeconomic History   Marital status: Married    Spouse name: Museum/gallery curator   Number of children: 2   Years of education: Not on file   Highest education level: Not on file  Occupational History   Occupation: Retired  Tobacco Use   Smoking  status: Never   Smokeless tobacco: Never  Vaping Use   Vaping status: Never Used  Substance and Sexual Activity   Alcohol use: No   Drug use: No   Sexual activity: Not on file  Other Topics Concern   Not on file  Social History Narrative   Not on file   Social Drivers of Health   Financial Resource Strain: Not on file  Food Insecurity: Patient Declined (06/05/2023)   Hunger Vital Sign    Worried About Running Out of Food in the Last Year: Patient declined    Ran Out of Food in the Last Year: Patient declined  Transportation Needs: Patient Declined (06/05/2023)   PRAPARE - Administrator, Civil Service (Medical): Patient declined    Lack of Transportation (Non-Medical): Patient declined  Physical Activity: Not on file  Stress: Not on file  Social Connections: Not on file  Intimate Partner Violence: Patient Declined (06/05/2023)   Humiliation, Afraid, Rape, and Kick questionnaire    Fear of Current or Ex-Partner: Patient declined    Emotionally Abused: Patient declined    Physically Abused: Patient declined    Sexually Abused: Patient declined    FAMILY HISTORY: Family History  Problem Relation Age of Onset   Diabetes Father    Hypertension Brother     ALLERGIES:  is allergic to penicillins, latex, and tape.  MEDICATIONS:  Current Outpatient Medications  Medication Sig Dispense Refill   acyclovir (ZOVIRAX) 400 MG tablet TAKE 1 TABLET BY MOUTH TWICE A DAY 180 tablet 3   amLODipine (NORVASC) 10 MG tablet Take 0.5 tablets (5 mg total) by mouth daily.     aspirin 81 MG chewable tablet Chew 81 mg by mouth daily.     carvedilol (COREG) 6.25 MG tablet Take 1 tablet (6.25 mg total) by mouth 2 (two) times daily with a meal.     diclofenac Sodium (VOLTAREN) 1 % GEL Apply 2 g topically daily as needed (for pain).     docusate sodium (COLACE) 100 MG capsule Take 1 capsule (100 mg total) by mouth daily as needed for mild constipation.     ferrous sulfate 325 (65 FE) MG  tablet Take 1 tablet (325 mg total) by mouth 2 (two) times daily as needed (iron).     finasteride (PROSCAR) 5 MG tablet Take 5 mg by mouth daily.     fluticasone (FLONASE) 50 MCG/ACT nasal spray Place 2 sprays into both nostrils daily as needed for allergies.     furosemide (LASIX) 20 MG tablet Take 0.5 tablets (10 mg total) by mouth every other day. 30 tablet    lactulose (CHRONULAC) 10 GM/15ML solution Take 10 g by mouth daily as needed for constipation.  5   lenalidomide (REVLIMID) 10 MG capsule TAKE 1 CAPSULE BY MOUTH 1 TIME A DAY FOR 21 DAYS ON THEN 7 DAYS OFF 21 capsule 0   ondansetron (ZOFRAN) 8 MG tablet Take 1 tablet (8 mg total) by mouth every 8 (eight) hours as needed for nausea or vomiting. 30 tablet 1  oxyCODONE-acetaminophen (PERCOCET/ROXICET) 5-325 MG tablet Take 0.5-1 tablets by mouth every 6 (six) hours as needed for severe pain (pain score 7-10). 20 tablet 0   pantoprazole (PROTONIX) 40 MG tablet Take 1 tablet (40 mg total) by mouth 2 (two) times daily. Take bid x 10 weeks, followed by daily thereafter 90 tablet 3   prochlorperazine (COMPAZINE) 10 MG tablet Take 1 tablet (10 mg total) by mouth every 6 (six) hours as needed for nausea or vomiting. 30 tablet 1   tamsulosin (FLOMAX) 0.4 MG CAPS capsule Take 1 capsule (0.4 mg total) by mouth daily after supper. 30 capsule 1   telmisartan (MICARDIS) 80 MG tablet Take 80 mg by mouth daily.     No current facility-administered medications for this visit.    REVIEW OF SYSTEMS:    .10 Point review of Systems was done is negative except as noted above.  PHYSICAL EXAMINATION: ECOG PERFORMANCE STATUS: 2 - Symptomatic, <50% confined to bed .BP 137/77 (BP Location: Left Arm, Patient Position: Sitting)   Pulse (!) 55 Comment: Nurse notified  Temp 97.9 F (36.6 C) (Temporal)   Resp 17   Wt 174 lb 4.8 oz (79.1 kg)   SpO2 100%   BMI 25.01 kg/m  . GENERAL:alert, in no acute distress and comfortable SKIN: no acute rashes, no  significant lesions EYES: conjunctiva are pink and non-injected, sclera anicteric OROPHARYNX: MMM, no exudates, no oropharyngeal erythema or ulceration NECK: supple, no JVD LYMPH:  no palpable lymphadenopathy in the cervical, axillary or inguinal regions LUNGS: clear to auscultation b/l with normal respiratory effort HEART: regular rate & rhythm ABDOMEN:  normoactive bowel sounds , non tender, not distended. Extremity: no pedal edema PSYCH: alert & oriented x 3 with fluent speech NEURO: no focal motor/sensory deficits    LABORATORY DATA:  I have reviewed the data as listed .    Latest Ref Rng & Units 03/19/2024    2:05 PM 02/20/2024    8:42 AM 01/23/2024    1:32 PM  CBC  WBC 4.0 - 10.5 K/uL 4.8  4.4  4.1   Hemoglobin 13.0 - 17.0 g/dL 16.1  09.6  04.5   Hematocrit 39.0 - 52.0 % 38.1  38.1  37.2   Platelets 150 - 400 K/uL 106  141  138    .    Latest Ref Rng & Units 03/19/2024    2:05 PM 02/20/2024    8:42 AM 01/23/2024    1:32 PM  CMP  Glucose 70 - 99 mg/dL 89  82  99   BUN 8 - 23 mg/dL 17  14  14    Creatinine 0.61 - 1.24 mg/dL 4.09  8.11  9.14   Sodium 135 - 145 mmol/L 143  142  142   Potassium 3.5 - 5.1 mmol/L 3.7  3.7  3.5   Chloride 98 - 111 mmol/L 109  108  110   CO2 22 - 32 mmol/L 28  30  28    Calcium 8.9 - 10.3 mg/dL 8.5  8.0  8.1   Total Protein 6.5 - 8.1 g/dL 6.5  6.5  6.3   Total Bilirubin 0.0 - 1.2 mg/dL 0.5  0.5  0.4   Alkaline Phos 38 - 126 U/L 47  51  54   AST 15 - 41 U/L 21  13  13    ALT 0 - 44 U/L 13  11  12       RADIOGRAPHIC STUDIES: I have personally reviewed the radiological images as listed and agreed  with the findings in the report. CT Chest Wo Contrast Result Date: 03/17/2024 CLINICAL DATA:  Follow-up right upper lung lesion EXAM: CT CHEST WITHOUT CONTRAST TECHNIQUE: Multidetector CT imaging of the chest was performed following the standard protocol without IV contrast. RADIATION DOSE REDUCTION: This exam was performed according to the departmental  dose-optimization program which includes automated exposure control, adjustment of the mA and/or kV according to patient size and/or use of iterative reconstruction technique. COMPARISON:  PET-CT dated 12/22/2023 FINDINGS: Cardiovascular: Heart is normal in size.  No pericardial effusion. No evidence of thoracic aortic aneurysm. Enlargement the main pulmonary artery, suggesting pulmonary arterial hypertension. Mediastinum/Nodes: No suspicious mediastinal or axillary lymphadenopathy. Visualized thyroid is unremarkable. Lungs/Pleura: Anterior right upper lobe nodular opacity is persistent but favored to be mildly improved (series 5/image 29), likely reflecting post infectious/inflammatory scarring. No suspicious pulmonary nodules. No focal consolidation. No pleural effusion or pneumothorax. Upper Abdomen: Visualized upper abdomen is notable for prior right radical nephrectomy and a dominant 5.1 cm left upper pole renal cyst, benign. Musculoskeletal: Scattered lytic lesions throughout the visualized axial appendicular skeleton, related to the patient's known multiple myeloma. Old fracture deformities of the medial clavicles. Multiple old right rib fracture deformities. IMPRESSION: Anterior right upper lobe nodular opacity is persistent but favored to be mildly improved, likely reflecting post infectious/inflammatory scarring. Scattered lytic lesions throughout the visualized axial and appendicular skeleton, corresponding to the patient's known multiple myeloma. Electronically Signed   By: Charline Bills M.D.   On: 03/17/2024 20:48     ASSESSMENT & PLAN:   78year-old male with   #1 Progressive IgG kappa myeloma: Initially diagnosed in 2016 as stage II disease with standard risk cytogenetics today. 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 Status post treatment as noted above. Last seen by [facility]. Lalla Brothers [facility] in January 2024 and noted to have progressive disease with  increase in his IgG kappa M spike at 2.87 g/dL.   Recent light chains done on 05/20/2023 at [facility] showed increase in free kappa light chains to 603 with a kappa lambda ratio of 25 this is up from kappa light chains of 402 with a kappa lambda ratio of 12.6 on 01/03/2023. CBC shows relatively stable hemoglobin of 11.3 with normal WBC count and platelets BMP shows stable chronic kidney disease with a creatinine of 1.87.  No overt hypercalcemia with a calcium of 9   #2 recent right clavicular pain with x-ray on 05/06/2023 showing mildly displaced likely pathologic fracture of the medial right clavicle.  Multiple other lytic lesions noted.   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  CT head done today 05/20/2023 shows  Large mass at the skull base, eroding and expanding the clivus, also involving the sphenoid bone and the petrous portions of both temporal bones. The mass broadly abuts both optic nerves between the chiasm and orbital apices. Mass also broadly involves both cavernous sinuses. 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 Recommend further assessment with brain MRI without and with contrast.   MRI brain 05/20/2023 1. 7.8 x 5.6 x 3.3 cm enhancing mass lesion at the skull base extending along the petrous ridge bilaterally, greater on the right. 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 There are some intrinsic T2 signal scratched at there is some intrinsic T2 signal and chordoma still  considered. However, given the multiple other enhancing lesions throughout the skull, this most likely represents metastatic disease. 2. No acute intracranial abnormality. 3. Mild atrophy and white matter changes are likely within normal limits for age. 4. Right mastoid effusion secondary to obstruction of the eustachian tube. 5. Minimal right  maxillary sinus disease.   ADDENDUM: Lytic lesions have been identified previously with this patient. 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   #4 hyponatremia   #5 history of renal cell carcinoma right nephrectomy on 02/03/2016.  He received cryotherapy to his left renal mass in April 2017. Surgically resected by Dr. [name] at [facility] on July 07, 2021. 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 Tumor is limited to the kidney (pT1a).   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 CBC shows slightly low Hgb of 12.3 g/dL with low Hct of 16.1% and low platelets of 106 K.  CMP stable with CKD creatinine 1.51 -Multiple myeloma panel results from 02/20/2024 did not show M-protein.  -Discussed CT chest results from 02/27/2024 in detail with the patient. Shows Anterior right upper lobe nodular opacity is persistent but favored to be mildly improved, likely reflecting post infectious/inflammatory scarring.  -Patient has been tolerating his maintenance Daratumumab treatment well without any new or severe toxicities.  -Patient has been tolerating his current dose of Revlimid 10 mg well without any new or severe toxicities.  -Pt can proceed with his maintenance Daratumumab treatment without dose modifications.  -Pt can continue Revlimid 10 mg. -Answered all of patient's questions.   FOLLOW-UP: Per integrated scheduling   The total time spent in the appointment was 30 minutes* .  All of the patient's questions were answered with apparent satisfaction. The patient knows to call the clinic with any problems, questions or concerns.   Wyvonnia Lora MD MS AAHIVMS [facility] Hematology/Oncology Physician [facility]  .*Total Encounter  Time as defined by the Centers for Medicare and Medicaid Services includes, in addition to the face-to-face time of a patient visit (documented in the note above) non-face-to-face time: obtaining and reviewing outside history, ordering and reviewing medications, tests or procedures, care coordination (communications with other health care professionals or caregivers) and documentation in the medical record.   I,Param Shah,acting as a Neurosurgeon for Wyvonnia Lora, MD.,have documented all relevant documentation on the behalf of Wyvonnia Lora, MD,as directed by  Wyvonnia Lora, MD while in the presence of Wyvonnia Lora, MD.  .I have reviewed the above documentation for accuracy and completeness, and I agree with the above. [name] MD

## 2024-03-19 NOTE — Patient Instructions (Signed)
 [facility] - A DEPT OF [facility]  Discharge Instructions: Thank you for choosing [facility] to provide your oncology and hematology care.   If you have a lab appointment with the Cancer Center, please go directly to the Cancer Center and check in at the registration area.   Wear comfortable clothing and clothing appropriate for easy access to any Portacath or PICC line.   We strive to give you quality time with your provider. You may need to reschedule your appointment if you arrive late (15 or more minutes).  Arriving late affects you and other patients whose appointments are after yours.  Also, if you miss three or more appointments without notifying the office, you may be dismissed from the clinic at the provider's discretion.      For prescription refill requests, have your pharmacy contact our office and allow 72 hours for refills to be completed.    Today you received the following chemotherapy and/or immunotherapy agent: Daratumumab Hyaluronidase (Darzalex Faspro)      To help prevent nausea and vomiting after your treatment, we encourage you to take your nausea medication as directed.  BELOW ARE SYMPTOMS THAT SHOULD BE REPORTED IMMEDIATELY: *FEVER GREATER THAN 100.4 F (38 C) OR HIGHER *CHILLS OR SWEATING *NAUSEA AND VOMITING THAT IS NOT CONTROLLED WITH YOUR NAUSEA MEDICATION *UNUSUAL SHORTNESS OF BREATH *UNUSUAL BRUISING OR BLEEDING *URINARY PROBLEMS (pain or burning when urinating, or frequent urination) *BOWEL PROBLEMS (unusual diarrhea, constipation, pain near the anus) TENDERNESS IN MOUTH AND THROAT WITH OR WITHOUT PRESENCE OF ULCERS (sore throat, sores in mouth, or a toothache) UNUSUAL RASH, SWELLING OR PAIN  UNUSUAL VAGINAL DISCHARGE OR ITCHING   Items with * indicate a potential emergency and should be followed up as soon as possible or go to the Emergency Department if any problems should occur.  Please show the  CHEMOTHERAPY ALERT CARD or IMMUNOTHERAPY ALERT CARD at check-in to the Emergency Department and triage nurse.  Should you have questions after your visit or need to cancel or reschedule your appointment, please contact [facility] - [facility]  Dept: [phone]  and follow the prompts.  Office hours are 8:00 a.m. to 4:30 p.m. Monday - Friday. Please note that voicemails left after 4:00 p.m. may not be returned until the following business day.  We are closed weekends and major holidays. You have access to a nurse at all times for urgent questions. Please call the main number to the clinic Dept: [phone] and follow the prompts.   For any non-urgent questions, you may also contact your provider using MyChart. We now offer e-Visits for anyone 8 and older to request care online for non-urgent symptoms. For details visit [email or web address].   Also download the MyChart app! Go to the app store, search "MyChart", open the app, select [facility], and log in with your MyChart username and password.  Daratumumab; Hyaluronidase Injection What is this medication? DARATUMUMAB; HYALURONIDASE (dar a toom ue mab; hye al ur ON i dase) treats multiple myeloma, a type of bone marrow cancer. Daratumumab works by blocking a protein that causes cancer cells to grow and multiply. This helps to slow or stop the spread of cancer cells. Hyaluronidase works by increasing the absorption of other medications in the body to help them work better. This medication may also be used treat amyloidosis, a condition that causes the buildup of a protein (amyloid)  in your body. It works by reducing the buildup of this protein, which decreases symptoms. It is a combination medication that contains a monoclonal antibody. This medicine may be used for other purposes; ask your health care provider or pharmacist if you have questions. COMMON BRAND NAME(S): DARZALEX FASPRO What should I tell  my care team before I take this medication? They need to know if you have any of these conditions: Heart disease Infection, such as chickenpox, cold sores, herpes, hepatitis B Lung or breathing disease An unusual or allergic reaction to daratumumab, hyaluronidase, other medications, foods, dyes, or preservatives Pregnant or trying to get pregnant Breast-feeding How should I use this medication? This medication is injected under the skin. It is given by your care team in a hospital or clinic setting. Talk to your care team about the use of this medication in children. Special care may be needed. Overdosage: If you think you have taken too much of this medicine contact a poison control center or emergency room at once. NOTE: This medicine is only for you. Do not share this medicine with others. What if I miss a dose? Keep appointments for follow-up doses. It is important not to miss your dose. Call your care team if you are unable to keep an appointment. What may interact with this medication? Interactions have not been studied. This list may not describe all possible interactions. Give your health care provider a list of all the medicines, herbs, non-prescription drugs, or dietary supplements you use. Also tell them if you smoke, drink alcohol, or use illegal drugs. Some items may interact with your medicine. What should I watch for while using this medication? Your condition will be monitored carefully while you are receiving this medication. This medication can cause serious allergic reactions. To reduce your risk, your care team may give you other medication to take before receiving this one. Be sure to follow the directions from your care team. This medication can affect the results of blood tests to match your blood type. These changes can last for up to 6 months after the final dose. Your care team will do blood tests to match your blood type before you start treatment. Tell all of your  care team that you are being treated with this medication before receiving a blood transfusion. This medication can affect the results of some tests used to determine treatment response; extra tests may be needed to evaluate response. Talk to your care team if you wish to become pregnant or think you are pregnant. This medication can cause serious birth defects if taken during pregnancy and for 3 months after the last dose. A reliable form of contraception is recommended while taking this medication and for 3 months after the last dose. Talk to your care team about effective forms of contraception. Do not breast-feed while taking this medication. What side effects may I notice from receiving this medication? Side effects that you should report to your care team as soon as possible: Allergic reactions--skin rash, itching, hives, swelling of the face, lips, tongue, or throat Heart rhythm changes--fast or irregular heartbeat, dizziness, feeling faint or lightheaded, chest pain, trouble breathing Infection--fever, chills, cough, sore throat, wounds that don't heal, pain or trouble when passing urine, general feeling of discomfort or being unwell Infusion reactions--chest pain, shortness of breath or trouble breathing, feeling faint or lightheaded Sudden eye pain or change in vision such as blurry vision, seeing halos around lights, vision loss Unusual bruising or bleeding Side effects that  usually do not require medical attention (report to your care team if they continue or are bothersome): Constipation Diarrhea Fatigue Nausea Pain, tingling, or numbness in the hands or feet Swelling of the ankles, hands, or feet This list may not describe all possible side effects. Call your doctor for medical advice about side effects. You may report side effects to FDA at 1-800-FDA-1088. Where should I keep my medication? This medication is given in a hospital or clinic. It will not be stored at home. NOTE:  This sheet is a summary. It may not cover all possible information. If you have questions about this medicine, talk to your doctor, pharmacist, or health care provider.  2024 Elsevier/Gold Standard (2022-04-12 00:00:00)  Denosumab Injection (Oncology) What is this medication? DENOSUMAB (den oh SUE mab) prevents weakened bones caused by cancer. It may also be used to treat noncancerous bone tumors that cannot be removed by surgery. It can also be used to treat high calcium levels in the blood caused by cancer. It works by blocking a protein that causes bones to break down quickly. This slows down the release of calcium from bones, which lowers calcium levels in your blood. It also makes your bones stronger and less likely to break (fracture). This medicine may be used for other purposes; ask your health care provider or pharmacist if you have questions. COMMON BRAND NAME(S): XGEVA What should I tell my care team before I take this medication? They need to know if you have any of these conditions: Dental disease Having surgery or tooth extraction Infection Kidney disease Low levels of calcium or vitamin D in the blood Malnutrition On hemodialysis Skin conditions or sensitivity Thyroid or parathyroid disease An unusual reaction to denosumab, other medications, foods, dyes, or preservatives Pregnant or trying to get pregnant Breast-feeding How should I use this medication? This medication is for injection under the skin. It is given by your care team in a hospital or clinic setting. A special MedGuide will be given to you before each treatment. Be sure to read this information carefully each time. Talk to your care team about the use of this medication in children. While it may be prescribed for children as young as 13 years for selected conditions, precautions do apply. Overdosage: If you think you have taken too much of this medicine contact a poison control center or emergency room at  once. NOTE: This medicine is only for you. Do not share this medicine with others. What if I miss a dose? Keep appointments for follow-up doses. It is important not to miss your dose. Call your care team if you are unable to keep an appointment. What may interact with this medication? Do not take this medication with any of the following: Other medications containing denosumab This medication may also interact with the following: Medications that lower your chance of fighting infection Steroid medications, such as prednisone or cortisone This list may not describe all possible interactions. Give your health care provider a list of all the medicines, herbs, non-prescription drugs, or dietary supplements you use. Also tell them if you smoke, drink alcohol, or use illegal drugs. Some items may interact with your medicine. What should I watch for while using this medication? Your condition will be monitored carefully while you are receiving this medication. You may need blood work while taking this medication. This medication may increase your risk of getting an infection. Call your care team for advice if you get a fever, chills, sore throat, or other  symptoms of a cold or flu. Do not treat yourself. Try to avoid being around people who are sick. You should make sure you get enough calcium and vitamin D while you are taking this medication, unless your care team tells you not to. Discuss the foods you eat and the vitamins you take with your care team. Some people who take this medication have severe bone, joint, or muscle pain. This medication may also increase your risk for jaw problems or a broken thigh bone. Tell your care team right away if you have severe pain in your jaw, bones, joints, or muscles. Tell your care team if you have any pain that does not go away or that gets worse. Talk to your care team if you may be pregnant. Serious birth defects can occur if you take this medication during  pregnancy and for 5 months after the last dose. You will need a negative pregnancy test before starting this medication. Contraception is recommended while taking this medication and for 5 months after the last dose. Your care team can help you find the option that works for you. What side effects may I notice from receiving this medication? Side effects that you should report to your care team as soon as possible: Allergic reactions--skin rash, itching, hives, swelling of the face, lips, tongue, or throat Bone, joint, or muscle pain Low calcium level--muscle pain or cramps, confusion, tingling, or numbness in the hands or feet Osteonecrosis of the jaw--pain, swelling, or redness in the mouth, numbness of the jaw, poor healing after dental work, unusual discharge from the mouth, visible bones in the mouth Side effects that usually do not require medical attention (report to your care team if they continue or are bothersome): Cough Diarrhea Fatigue Headache Nausea This list may not describe all possible side effects. Call your doctor for medical advice about side effects. You may report side effects to FDA at 1-800-FDA-1088. Where should I keep my medication? This medication is given in a hospital or clinic. It will not be stored at home. NOTE: This sheet is a summary. It may not cover all possible information. If you have questions about this medicine, talk to your doctor, pharmacist, or health care provider.  2024 Elsevier/Gold Standard (2022-04-27 00:00:00)

## 2024-03-20 LAB — KAPPA/LAMBDA LIGHT CHAINS
Kappa free light chain: 27.5 mg/L — ABNORMAL HIGH (ref 3.3–19.4)
Kappa, lambda light chain ratio: 1.12 (ref 0.26–1.65)
Lambda free light chains: 24.6 mg/L (ref 5.7–26.3)

## 2024-03-22 LAB — MULTIPLE MYELOMA PANEL, SERUM
Albumin SerPl Elph-Mcnc: 3.2 g/dL (ref 2.9–4.4)
Albumin/Glob SerPl: 1.3 (ref 0.7–1.7)
Alpha 1: 0.2 g/dL (ref 0.0–0.4)
Alpha2 Glob SerPl Elph-Mcnc: 0.7 g/dL (ref 0.4–1.0)
B-Globulin SerPl Elph-Mcnc: 0.8 g/dL (ref 0.7–1.3)
Gamma Glob SerPl Elph-Mcnc: 0.9 g/dL (ref 0.4–1.8)
Globulin, Total: 2.6 g/dL (ref 2.2–3.9)
IgA: 116 mg/dL (ref 61–437)
IgG (Immunoglobin G), Serum: 906 mg/dL (ref 603–1613)
IgM (Immunoglobulin M), Srm: 10 mg/dL — ABNORMAL LOW (ref 15–143)
Total Protein ELP: 5.8 g/dL — ABNORMAL LOW (ref 6.0–8.5)

## 2024-03-25 ENCOUNTER — Encounter: Payer: Self-pay | Admitting: Hematology

## 2024-03-26 ENCOUNTER — Other Ambulatory Visit: Payer: Self-pay | Admitting: Hematology

## 2024-03-26 DIAGNOSIS — C9002 Multiple myeloma in relapse: Secondary | ICD-10-CM

## 2024-04-05 ENCOUNTER — Other Ambulatory Visit: Payer: Self-pay

## 2024-04-16 ENCOUNTER — Inpatient Hospital Stay

## 2024-04-16 ENCOUNTER — Inpatient Hospital Stay (HOSPITAL_BASED_OUTPATIENT_CLINIC_OR_DEPARTMENT_OTHER): Admitting: Hematology

## 2024-04-16 VITALS — BP 131/74 | HR 50 | Temp 97.8°F | Resp 16 | Ht 70.0 in | Wt 174.2 lb

## 2024-04-16 DIAGNOSIS — C9002 Multiple myeloma in relapse: Secondary | ICD-10-CM

## 2024-04-16 DIAGNOSIS — Z5112 Encounter for antineoplastic immunotherapy: Secondary | ICD-10-CM | POA: Diagnosis not present

## 2024-04-16 DIAGNOSIS — Z7189 Other specified counseling: Secondary | ICD-10-CM

## 2024-04-16 DIAGNOSIS — Z5111 Encounter for antineoplastic chemotherapy: Secondary | ICD-10-CM | POA: Diagnosis not present

## 2024-04-16 LAB — CBC WITH DIFFERENTIAL (CANCER CENTER ONLY)
Abs Immature Granulocytes: 0 10*3/uL (ref 0.00–0.07)
Basophils Absolute: 0.1 10*3/uL (ref 0.0–0.1)
Basophils Relative: 2 %
Eosinophils Absolute: 0.3 10*3/uL (ref 0.0–0.5)
Eosinophils Relative: 6 %
HCT: 35.4 % — ABNORMAL LOW (ref 39.0–52.0)
Hemoglobin: 11.6 g/dL — ABNORMAL LOW (ref 13.0–17.0)
Immature Granulocytes: 0 %
Lymphocytes Relative: 46 %
Lymphs Abs: 1.9 10*3/uL (ref 0.7–4.0)
MCH: 29.1 pg (ref 26.0–34.0)
MCHC: 32.8 g/dL (ref 30.0–36.0)
MCV: 88.9 fL (ref 80.0–100.0)
Monocytes Absolute: 0.7 10*3/uL (ref 0.1–1.0)
Monocytes Relative: 17 %
Neutro Abs: 1.2 10*3/uL — ABNORMAL LOW (ref 1.7–7.7)
Neutrophils Relative %: 29 %
Platelet Count: 126 10*3/uL — ABNORMAL LOW (ref 150–400)
RBC: 3.98 MIL/uL — ABNORMAL LOW (ref 4.22–5.81)
RDW: 15.9 % — ABNORMAL HIGH (ref 11.5–15.5)
WBC Count: 4.2 10*3/uL (ref 4.0–10.5)
nRBC: 0 % (ref 0.0–0.2)

## 2024-04-16 LAB — COMPREHENSIVE METABOLIC PANEL WITH GFR
ALT: 10 U/L (ref 0–44)
AST: 12 U/L — ABNORMAL LOW (ref 15–41)
Albumin: 3.7 g/dL (ref 3.5–5.0)
Alkaline Phosphatase: 39 U/L (ref 38–126)
Anion gap: 4 — ABNORMAL LOW (ref 5–15)
BUN: 14 mg/dL (ref 8–23)
CO2: 29 mmol/L (ref 22–32)
Calcium: 8.2 mg/dL — ABNORMAL LOW (ref 8.9–10.3)
Chloride: 109 mmol/L (ref 98–111)
Creatinine, Ser: 1.62 mg/dL — ABNORMAL HIGH (ref 0.61–1.24)
GFR, Estimated: 43 mL/min — ABNORMAL LOW (ref >60)
Glucose, Bld: 69 mg/dL — ABNORMAL LOW (ref 70–99)
Potassium: 3.5 mmol/L (ref 3.5–5.1)
Sodium: 142 mmol/L (ref 135–145)
Total Bilirubin: 0.5 mg/dL (ref 0.0–1.2)
Total Protein: 6 g/dL — ABNORMAL LOW (ref 6.5–8.1)

## 2024-04-16 MED ORDER — DARATUMUMAB-HYALURONIDASE-FIHJ 1800-30000 MG-UT/15ML ~~LOC~~ SOLN
1800.0000 mg | Freq: Once | SUBCUTANEOUS | Status: AC
  Administered 2024-04-16: 1800 mg via SUBCUTANEOUS
  Filled 2024-04-16: qty 15

## 2024-04-16 MED ORDER — FAMOTIDINE 20 MG PO TABS
20.0000 mg | ORAL_TABLET | Freq: Once | ORAL | Status: AC
  Administered 2024-04-16: 20 mg via ORAL
  Filled 2024-04-16: qty 1

## 2024-04-16 MED ORDER — DEXAMETHASONE 4 MG PO TABS
12.0000 mg | ORAL_TABLET | Freq: Once | ORAL | Status: AC
  Administered 2024-04-16: 12 mg via ORAL
  Filled 2024-04-16: qty 3

## 2024-04-16 MED ORDER — ACETAMINOPHEN 325 MG PO TABS
650.0000 mg | ORAL_TABLET | Freq: Once | ORAL | Status: AC
  Administered 2024-04-16: 650 mg via ORAL
  Filled 2024-04-16: qty 2

## 2024-04-16 MED ORDER — DENOSUMAB 120 MG/1.7ML ~~LOC~~ SOLN
120.0000 mg | Freq: Once | SUBCUTANEOUS | Status: AC
Start: 2024-04-16 — End: 2024-04-16
  Administered 2024-04-16: 120 mg via SUBCUTANEOUS
  Filled 2024-04-16: qty 1.7

## 2024-04-16 MED ORDER — DIPHENHYDRAMINE HCL 25 MG PO CAPS
50.0000 mg | ORAL_CAPSULE | Freq: Once | ORAL | Status: AC
  Administered 2024-04-16: 50 mg via ORAL
  Filled 2024-04-16: qty 2

## 2024-04-16 NOTE — Progress Notes (Signed)
 Per Dr. Salomon Cree, ok for treatment today with ANC 1.2 K/uL.

## 2024-04-16 NOTE — Progress Notes (Signed)
 HEMATOLOGY/ONCOLOGY CLINIC NOTE  Date of Service: 04/16/24    Patient Care Team: Avva, Ravisankar, MD as PCP - General (Internal Medicine)  CHIEF COMPLAINTS/PURPOSE OF CONSULTATION:  F/u for continued mx of myeloma  HISTORY OF PRESENTING ILLNESS:  Marcus Hatfield is a wonderful 78 y.o. 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  Patient was seen by me as an inpatient on 05/20/2023.  He noted that his vision may be slightly better with the steroid. Had some mild insomnia but it was not too bothersome. Has been seen by radiation oncology and is going to be set up for CT simulation and to start radiation 05/21/2023.  Did have a CT chest abdomen pelvis without contrast to evaluate for any other soft tissue metastatic disease and whole-body skeletal survey to determine other overt progression and to rule out the possibility of further source of metastatic disease.  Today, he is accompanied by two family members. He reports that he is feeling well overall. He reports that his vision has improved and returned to baseline. He denies any glares in his vision which were previously present. He is able to count fingers and notes improved sight of colors on television.   His p.o. intake is normal and he notes drinking 4 16oz bottles daily. He denies any fevers, chills, night sweats, new back pain, fatigue, posterior neck pain, or abdominal pain.  He reports some nausea with previously taking Revlimid . Patient reports that he has an upcoming dental appointment.  INTERVAL HISTORY:  Marcus Hatfield is a wonderful 78 y.o. male who is here for continued evaluation and management of his multiple myeloma.He is here for cycle 12 day 1 of his treatment.   Patient was last seen by me on 03/19/2024 and he complained of chronic lower back pain due to arthritis.   Patient is accompanied by his wife during this visit. Patient notes he has been doing well  overall since our last visit. He does complain of mild constipation and one episode of mild headache this morning. He also complains of ingrown nail of his left big toe.  He notes that he shall follow-up with his PCP regarding this.   He denies any new infection issues, fever, chills, night sweats, unexpected weight loss, back pain, chest pain, abdominal pain, chest pain, or leg swelling.   Patient notes he has been tolerating his treatment well without any new or severe toxicities.    MEDICAL HISTORY:  Past Medical History:  Diagnosis Date   Anemia    Arthritis    CKD (chronic kidney disease) stage 3, GFR 30-59 ml/min (HCC) 20015   Creat 1.9   Hypertension    Multiple myeloma (HCC) 2016   [facility] heme [facility]   Prostate disorder 02/2017    SURGICAL HISTORY: Past Surgical History:  Procedure Laterality Date   BIOPSY  06/06/2023   Procedure: BIOPSY;  Surgeon: Daina Drum, MD;  Location: [facility] ENDOSCOPY;  Service: Gastroenterology;;   ESOPHAGOGASTRODUODENOSCOPY (EGD) WITH PROPOFOL  N/A 06/06/2023   Procedure: ESOPHAGOGASTRODUODENOSCOPY (EGD) WITH PROPOFOL ;  Surgeon: Daina Drum, MD;  Location: [facility] ENDOSCOPY;  Service: Gastroenterology;  Laterality: N/A;   EYE SURGERY Bilateral 01/30/2018   KNEE SURGERY     over ten years ago   NEPHRECTOMY Right 03/2015   ROBOTIC ASSITED PARTIAL NEPHRECTOMY Left 07/07/2021   Procedure: XI ROBOTIC ASSITED PARTIAL NEPHRECTOMY;  Surgeon: Osborn Blaze, MD;  Location: [facility] ORS;  Service: Urology;  Laterality: Left;  5 HRS   XI ROBOTIC ASSISTED SIMPLE PROSTATECTOMY N/A 07/07/2021   Procedure: XI ROBOTIC ASSISTED SIMPLE PROSTATECTOMY;  Surgeon: Osborn Blaze, MD;  Location: [facility] ORS;  Service: Urology;  Laterality: N/A;    SOCIAL HISTORY: Social History   Socioeconomic History   Marital status: Married    Spouse name: Museum/gallery curator   Number of children: 2   Years of education: Not on file   Highest education level: Not on file  Occupational History    Occupation: Retired  Tobacco Use   Smoking status: Never   Smokeless tobacco: Never  Vaping Use   Vaping status: Never Used  Substance and Sexual Activity   Alcohol  use: No   Drug use: No   Sexual activity: Not on file  Other Topics Concern   Not on file  Social History Narrative   Not on file   Social Drivers of Health   Financial Resource Strain: Not on file  Food Insecurity: Patient Declined (06/05/2023)   Hunger Vital Sign    Worried About Running Out of Food in the Last Year: Patient declined    Ran Out of Food in the Last Year: Patient declined  Transportation Needs: Patient Declined (06/05/2023)   PRAPARE - Administrator, Civil Service (Medical): Patient declined    Lack of Transportation (Non-Medical): Patient declined  Physical Activity: Not on file  Stress: Not on file  Social Connections: Not on file  Intimate Partner Violence: Patient Declined (06/05/2023)   Humiliation, Afraid, Rape, and Kick questionnaire    Fear of Current or Ex-Partner: Patient declined    Emotionally Abused: Patient declined    Physically Abused: Patient declined    Sexually Abused: Patient declined    FAMILY HISTORY: Family History  Problem Relation Age of Onset   Diabetes Father    Hypertension Brother     ALLERGIES:  is allergic to penicillins, latex, and tape.  MEDICATIONS:  Current Outpatient Medications  Medication Sig Dispense Refill   acyclovir  (ZOVIRAX ) 400 MG tablet TAKE 1 TABLET BY MOUTH TWICE A DAY 180 tablet 3   amLODipine  (NORVASC ) 10 MG tablet Take 0.5 tablets (5 mg total) by mouth daily.     aspirin  81 MG chewable tablet Chew 81 mg by mouth daily.     carvedilol  (COREG ) 6.25 MG tablet Take 1 tablet (6.25 mg total) by mouth 2 (two) times daily with a meal.     diclofenac Sodium (VOLTAREN) 1 % GEL Apply 2 g topically daily as needed (for pain).     docusate sodium  (COLACE) 100 MG capsule Take 1 capsule (100 mg total) by mouth daily as needed for mild  constipation.     ferrous sulfate  325 (65 FE) MG tablet Take 1 tablet (325 mg total) by mouth 2 (two) times daily as needed (iron).     finasteride  (PROSCAR ) 5 MG tablet Take 5 mg by mouth daily.     fluticasone  (FLONASE ) 50 MCG/ACT nasal spray Place 2 sprays into both nostrils daily as needed for allergies.     furosemide  (LASIX ) 20 MG tablet Take 0.5 tablets (10 mg total) by mouth every other day. 30 tablet    lactulose  (CHRONULAC ) 10 GM/15ML solution Take 10 g by mouth daily as needed for constipation.  5   lenalidomide  (REVLIMID ) 10 MG capsule TAKE 1 CAPSULE BY MOUTH 1 TIME A DAY FOR 21 DAYS ON THEN 7 DAYS OFF 21 capsule 0   ondansetron  (ZOFRAN ) 8 MG tablet Take 1 tablet (8 mg  total) by mouth every 8 (eight) hours as needed for nausea or vomiting. 30 tablet 1   oxyCODONE -acetaminophen  (PERCOCET/ROXICET) 5-325 MG tablet Take 0.5-1 tablets by mouth every 6 (six) hours as needed for severe pain (pain score 7-10). 20 tablet 0   pantoprazole  (PROTONIX ) 40 MG tablet Take 1 tablet (40 mg total) by mouth 2 (two) times daily. Take bid x 10 weeks, followed by daily thereafter 90 tablet 3   prochlorperazine  (COMPAZINE ) 10 MG tablet Take 1 tablet (10 mg total) by mouth every 6 (six) hours as needed for nausea or vomiting. 30 tablet 1   tamsulosin  (FLOMAX ) 0.4 MG CAPS capsule Take 1 capsule (0.4 mg total) by mouth daily after supper. 30 capsule 1   telmisartan (MICARDIS) 80 MG tablet Take 80 mg by mouth daily.     No current facility-administered medications for this visit.    REVIEW OF SYSTEMS:    .10 Point review of Systems was done is negative except as noted above.  PHYSICAL EXAMINATION: ECOG PERFORMANCE STATUS: 2 - Symptomatic, <50% confined to bed VSS GENERAL:alert, in no acute distress and comfortable SKIN: no acute rashes, no significant lesions EYES: conjunctiva are pink and non-injected, sclera anicteric OROPHARYNX: MMM, no exudates, no oropharyngeal erythema or ulceration NECK: supple,  no JVD LYMPH:  no palpable lymphadenopathy in the cervical, axillary or inguinal regions LUNGS: clear to auscultation b/l with normal respiratory effort HEART: regular rate & rhythm ABDOMEN:  normoactive bowel sounds , non tender, not distended. Extremity: no pedal edema PSYCH: alert & oriented x 3 with fluent speech NEURO: no focal motor/sensory deficits    LABORATORY DATA:  I have reviewed the data as listed .    Latest Ref Rng & Units 04/16/2024   10:25 AM 03/19/2024    2:05 PM 02/20/2024    8:42 AM  CBC  WBC 4.0 - 10.5 K/uL 4.2  4.8  4.4   Hemoglobin 13.0 - 17.0 g/dL 16.1  09.6  04.5   Hematocrit 39.0 - 52.0 % 35.4  38.1  38.1   Platelets 150 - 400 K/uL 126  106  141    .    Latest Ref Rng & Units 04/16/2024   10:25 AM 03/19/2024    2:05 PM 02/20/2024    8:42 AM  CMP  Glucose 70 - 99 mg/dL 69  89  82   BUN 8 - 23 mg/dL 14  17  14    Creatinine 0.61 - 1.24 mg/dL 4.09  8.11  9.14   Sodium 135 - 145 mmol/L 142  143  142   Potassium 3.5 - 5.1 mmol/L 3.5  3.7  3.7   Chloride 98 - 111 mmol/L 109  109  108   CO2 22 - 32 mmol/L 29  28  30    Calcium  8.9 - 10.3 mg/dL 8.2  8.5  8.0   Total Protein 6.5 - 8.1 g/dL 6.0  6.5  6.5   Total Bilirubin 0.0 - 1.2 mg/dL 0.5  0.5  0.5   Alkaline Phos 38 - 126 U/L 39  47  51   AST 15 - 41 U/L 12  21  13    ALT 0 - 44 U/L 10  13  11       RADIOGRAPHIC STUDIES: I have personally reviewed the radiological images as listed and agreed with the findings in the report. No results found.    ASSESSMENT & PLAN:   78year-old male with   #1 Progressive IgG kappa myeloma: Initially diagnosed in 2016  as stage II disease with standard risk cytogenetics today. 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 Status post treatment as noted above. Last seen by Dr. Marven Slimmer at [facility] in January 2024 and noted to have progressive disease with increase in his IgG kappa M spike at 2.87 g/dL.   Recent light chains done on 05/20/2023 at  [facility] showed increase in free kappa light chains to 603 with a kappa lambda ratio of 25 this is up from kappa light chains of 402 with a kappa lambda ratio of 12.6 on 01/03/2023. CBC shows relatively stable hemoglobin of 11.3 with normal WBC count and platelets BMP shows stable chronic kidney disease with a creatinine of 1.87.  No overt hypercalcemia with a calcium  of 9   #2 recent right clavicular pain with x-ray on 05/06/2023 showing mildly displaced likely pathologic fracture of the medial right clavicle.  Multiple other lytic lesions noted.   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  CT head done today 05/20/2023 shows  Large mass at the skull base, eroding and expanding the clivus, also involving the sphenoid bone and the petrous portions of both temporal bones. The mass broadly abuts both optic nerves between the chiasm and orbital apices. Mass also broadly involves both cavernous sinuses. 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 Recommend further assessment with brain MRI without and with contrast.   MRI brain 05/20/2023 1. 7.8 x 5.6 x 3.3 cm enhancing mass lesion at the skull base extending along the petrous ridge bilaterally, greater on the right. 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 There are some intrinsic T2 signal scratched at there is some intrinsic T2 signal and chordoma still considered. However, given the multiple other enhancing lesions throughout the skull, this most likely represents metastatic disease. 2. No acute intracranial abnormality. 3. Mild atrophy and white matter changes are likely within normal limits for age. 4. Right mastoid effusion secondary to obstruction of the eustachian tube. 5. Minimal right maxillary sinus disease.   ADDENDUM: Lytic lesions have been identified previously with this  patient. 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   #4 hyponatremia   #5 history of renal cell carcinoma right nephrectomy on 02/03/2016.  He received cryotherapy to his left renal mass in April 2017. Surgically resected by Dr. Osborn Blaze at [facility] on July 07, 2021. 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 Tumor is limited to the kidney (pT1a).   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 CBC shows low Hgb of 11.6 g/dL with Hct of 16.1% and low platelets of 126 K. CMP shows elevated Creatinine level of 1.62, low calcium  level of 8.2, and low AST level of 12.  -Patient has been tolerating his maintenance Daratumumab  treatment well without any new or severe toxicities.  -Patient has been tolerating his current dose of Revlimid  10 mg well without any new or severe toxicities.  -Pt can proceed with his maintenance Daratumumab  treatment without dose modifications in the same supportive medications. -Continue his acyclovir  for shingles prevention/prophylaxis -Pt can continue Revlimid  10 mg.  He continues to be on aspirin   81 mg p.o. daily for VTE prophylaxis. -Answered all of patient's questions.   FOLLOW-UP:   The total time spent in the appointment was 30 minutes* .  All of the patient's questions were answered with apparent satisfaction. The patient knows to call the clinic with any problems, questions or concerns.   Jacquelyn Matt MD MS AAHIVMS [facility] Hematology/Oncology Physician [facility]  .*Total Encounter Time as defined by the Centers for Medicare and Medicaid Services includes, in addition to the face-to-face time of a patient visit (documented in the note above) non-face-to-face time: obtaining and reviewing outside  history, ordering and reviewing medications, tests or procedures, care coordination (communications with other health care professionals or caregivers) and documentation in the medical record.   I,Param Shah,acting as a Neurosurgeon for Jacquelyn Matt, MD.,have documented all relevant documentation on the behalf of Jacquelyn Matt, MD,as directed by  Jacquelyn Matt, MD while in the presence of Jacquelyn Matt, MD.  .I have reviewed the above documentation for accuracy and completeness, and I agree with the above. .Adrienne Delay Kishore Ainsley Sanguinetti MD

## 2024-04-16 NOTE — Progress Notes (Signed)
 Ok for xgeva  today with scr=1.6 and corrected ca = 8.4 per Dr Salomon Cree

## 2024-04-16 NOTE — Patient Instructions (Signed)
 [facility] - A DEPT OF [facility]  Discharge Instructions: Thank you for choosing [facility] to provide your oncology and hematology care.   If you have a lab appointment with the Cancer Center, please go directly to the Cancer Center and check in at the registration area.   Wear comfortable clothing and clothing appropriate for easy access to any Portacath or PICC line.   We strive to give you quality time with your provider. You may need to reschedule your appointment if you arrive late (15 or more minutes).  Arriving late affects you and other patients whose appointments are after yours.  Also, if you miss three or more appointments without notifying the office, you may be dismissed from the clinic at the provider's discretion.      For prescription refill requests, have your pharmacy contact our office and allow 72 hours for refills to be completed.    Today you received the following chemotherapy and/or immunotherapy agent: Daratumumab & Hyaluronidase (Darzalex Faspro)   To help prevent nausea and vomiting after your treatment, we encourage you to take your nausea medication as directed.  BELOW ARE SYMPTOMS THAT SHOULD BE REPORTED IMMEDIATELY: *FEVER GREATER THAN 100.4 F (38 C) OR HIGHER *CHILLS OR SWEATING *NAUSEA AND VOMITING THAT IS NOT CONTROLLED WITH YOUR NAUSEA MEDICATION *UNUSUAL SHORTNESS OF BREATH *UNUSUAL BRUISING OR BLEEDING *URINARY PROBLEMS (pain or burning when urinating, or frequent urination) *BOWEL PROBLEMS (unusual diarrhea, constipation, pain near the anus) TENDERNESS IN MOUTH AND THROAT WITH OR WITHOUT PRESENCE OF ULCERS (sore throat, sores in mouth, or a toothache) UNUSUAL RASH, SWELLING OR PAIN  UNUSUAL VAGINAL DISCHARGE OR ITCHING   Items with * indicate a potential emergency and should be followed up as soon as possible or go to the Emergency Department if any problems should occur.  Please show the  CHEMOTHERAPY ALERT CARD or IMMUNOTHERAPY ALERT CARD at check-in to the Emergency Department and triage nurse.  Should you have questions after your visit or need to cancel or reschedule your appointment, please contact [facility] - A DEPT [facility]  Dept: [phone]  and follow the prompts.  Office hours are 8:00 a.m. to 4:30 p.m. Monday - Friday. Please note that voicemails left after 4:00 p.m. may not be returned until the following business day.  We are closed weekends and major holidays. You have access to a nurse at all times for urgent questions. Please call the main number to the clinic Dept: [phone] and follow the prompts.   For any non-urgent questions, you may also contact your provider using MyChart. We now offer e-Visits for anyone 34 and older to request care online for non-urgent symptoms. For details visit [email or web address].   Also download the MyChart app! Go to the app store, search "MyChart", open the app, select [facility], and log in with your MyChart username and password.  Daratumumab; Hyaluronidase Injection What is this medication? DARATUMUMAB; HYALURONIDASE (dar a toom ue mab; hye al ur ON i dase) treats multiple myeloma, a type of bone marrow cancer. Daratumumab works by blocking a protein that causes cancer cells to grow and multiply. This helps to slow or stop the spread of cancer cells. Hyaluronidase works by increasing the absorption of other medications in the body to help them work better. This medication may also be used treat amyloidosis, a condition that causes the buildup of a protein (amyloid) in your  body. It works by reducing the buildup of this protein, which decreases symptoms. It is a combination medication that contains a monoclonal antibody. This medicine may be used for other purposes; ask your health care provider or pharmacist if you have questions. COMMON BRAND NAME(S): DARZALEX FASPRO What should I tell  my care team before I take this medication? They need to know if you have any of these conditions: Heart disease Infection, such as chickenpox, cold sores, herpes, hepatitis B Lung or breathing disease An unusual or allergic reaction to daratumumab, hyaluronidase, other medications, foods, dyes, or preservatives Pregnant or trying to get pregnant Breast-feeding How should I use this medication? This medication is injected under the skin. It is given by your care team in a hospital or clinic setting. Talk to your care team about the use of this medication in children. Special care may be needed. Overdosage: If you think you have taken too much of this medicine contact a poison control center or emergency room at once. NOTE: This medicine is only for you. Do not share this medicine with others. What if I miss a dose? Keep appointments for follow-up doses. It is important not to miss your dose. Call your care team if you are unable to keep an appointment. What may interact with this medication? Interactions have not been studied. This list may not describe all possible interactions. Give your health care provider a list of all the medicines, herbs, non-prescription drugs, or dietary supplements you use. Also tell them if you smoke, drink alcohol, or use illegal drugs. Some items may interact with your medicine. What should I watch for while using this medication? Your condition will be monitored carefully while you are receiving this medication. This medication can cause serious allergic reactions. To reduce your risk, your care team may give you other medication to take before receiving this one. Be sure to follow the directions from your care team. This medication can affect the results of blood tests to match your blood type. These changes can last for up to 6 months after the final dose. Your care team will do blood tests to match your blood type before you start treatment. Tell all of your  care team that you are being treated with this medication before receiving a blood transfusion. This medication can affect the results of some tests used to determine treatment response; extra tests may be needed to evaluate response. Talk to your care team if you wish to become pregnant or think you are pregnant. This medication can cause serious birth defects if taken during pregnancy and for 3 months after the last dose. A reliable form of contraception is recommended while taking this medication and for 3 months after the last dose. Talk to your care team about effective forms of contraception. Do not breast-feed while taking this medication. What side effects may I notice from receiving this medication? Side effects that you should report to your care team as soon as possible: Allergic reactions--skin rash, itching, hives, swelling of the face, lips, tongue, or throat Heart rhythm changes--fast or irregular heartbeat, dizziness, feeling faint or lightheaded, chest pain, trouble breathing Infection--fever, chills, cough, sore throat, wounds that don't heal, pain or trouble when passing urine, general feeling of discomfort or being unwell Infusion reactions--chest pain, shortness of breath or trouble breathing, feeling faint or lightheaded Sudden eye pain or change in vision such as blurry vision, seeing halos around lights, vision loss Unusual bruising or bleeding Side effects that usually do  not require medical attention (report to your care team if they continue or are bothersome): Constipation Diarrhea Fatigue Nausea Pain, tingling, or numbness in the hands or feet Swelling of the ankles, hands, or feet This list may not describe all possible side effects. Call your doctor for medical advice about side effects. You may report side effects to FDA at 1-800-FDA-1088. Where should I keep my medication? This medication is given in a hospital or clinic. It will not be stored at home. NOTE:  This sheet is a summary. It may not cover all possible information. If you have questions about this medicine, talk to your doctor, pharmacist, or health care provider.  2024 Elsevier/Gold Standard (2022-04-12 00:00:00)

## 2024-04-17 LAB — KAPPA/LAMBDA LIGHT CHAINS
Kappa free light chain: 23.9 mg/L — ABNORMAL HIGH (ref 3.3–19.4)
Kappa, lambda light chain ratio: 0.99 (ref 0.26–1.65)
Lambda free light chains: 24.2 mg/L (ref 5.7–26.3)

## 2024-04-22 ENCOUNTER — Other Ambulatory Visit: Payer: Self-pay | Admitting: Hematology

## 2024-04-22 ENCOUNTER — Encounter: Payer: Self-pay | Admitting: Hematology

## 2024-04-22 DIAGNOSIS — M84412D Pathological fracture, left shoulder, subsequent encounter for fracture with routine healing: Secondary | ICD-10-CM | POA: Diagnosis not present

## 2024-04-22 DIAGNOSIS — R972 Elevated prostate specific antigen [PSA]: Secondary | ICD-10-CM | POA: Diagnosis not present

## 2024-04-22 DIAGNOSIS — M899 Disorder of bone, unspecified: Secondary | ICD-10-CM | POA: Diagnosis not present

## 2024-04-22 DIAGNOSIS — N1831 Chronic kidney disease, stage 3a: Secondary | ICD-10-CM | POA: Diagnosis not present

## 2024-04-22 DIAGNOSIS — C9000 Multiple myeloma not having achieved remission: Secondary | ICD-10-CM | POA: Diagnosis not present

## 2024-04-22 DIAGNOSIS — D631 Anemia in chronic kidney disease: Secondary | ICD-10-CM | POA: Diagnosis not present

## 2024-04-22 DIAGNOSIS — E782 Mixed hyperlipidemia: Secondary | ICD-10-CM | POA: Diagnosis not present

## 2024-04-22 DIAGNOSIS — I129 Hypertensive chronic kidney disease with stage 1 through stage 4 chronic kidney disease, or unspecified chronic kidney disease: Secondary | ICD-10-CM | POA: Diagnosis not present

## 2024-04-22 DIAGNOSIS — C9002 Multiple myeloma in relapse: Secondary | ICD-10-CM

## 2024-04-22 DIAGNOSIS — C641 Malignant neoplasm of right kidney, except renal pelvis: Secondary | ICD-10-CM | POA: Diagnosis not present

## 2024-04-22 DIAGNOSIS — M1990 Unspecified osteoarthritis, unspecified site: Secondary | ICD-10-CM | POA: Diagnosis not present

## 2024-04-22 LAB — MULTIPLE MYELOMA PANEL, SERUM
Albumin SerPl Elph-Mcnc: 3.3 g/dL (ref 2.9–4.4)
Albumin/Glob SerPl: 1.4 (ref 0.7–1.7)
Alpha 1: 0.2 g/dL (ref 0.0–0.4)
Alpha2 Glob SerPl Elph-Mcnc: 0.6 g/dL (ref 0.4–1.0)
B-Globulin SerPl Elph-Mcnc: 0.7 g/dL (ref 0.7–1.3)
Gamma Glob SerPl Elph-Mcnc: 0.8 g/dL (ref 0.4–1.8)
Globulin, Total: 2.4 g/dL (ref 2.2–3.9)
IgA: 111 mg/dL (ref 61–437)
IgG (Immunoglobin G), Serum: 845 mg/dL (ref 603–1613)
IgM (Immunoglobulin M), Srm: 9 mg/dL — ABNORMAL LOW (ref 15–143)
Total Protein ELP: 5.7 g/dL — ABNORMAL LOW (ref 6.0–8.5)

## 2024-04-24 ENCOUNTER — Other Ambulatory Visit (HOSPITAL_COMMUNITY): Payer: Self-pay | Admitting: Urology

## 2024-04-24 ENCOUNTER — Ambulatory Visit (HOSPITAL_COMMUNITY)
Admission: RE | Admit: 2024-04-24 | Discharge: 2024-04-24 | Disposition: A | Discharge status: Home / Self Care | Source: Ambulatory Visit | Attending: Urology | Admitting: Urology

## 2024-04-24 DIAGNOSIS — R82998 Other abnormal findings in urine: Secondary | ICD-10-CM | POA: Diagnosis not present

## 2024-04-24 DIAGNOSIS — C641 Malignant neoplasm of right kidney, except renal pelvis: Secondary | ICD-10-CM

## 2024-04-24 DIAGNOSIS — C9000 Multiple myeloma not having achieved remission: Secondary | ICD-10-CM | POA: Diagnosis not present

## 2024-04-24 DIAGNOSIS — C801 Malignant (primary) neoplasm, unspecified: Secondary | ICD-10-CM | POA: Diagnosis not present

## 2024-04-25 ENCOUNTER — Encounter: Payer: Self-pay | Admitting: Hematology

## 2024-05-03 DIAGNOSIS — Z905 Acquired absence of kidney: Secondary | ICD-10-CM | POA: Diagnosis not present

## 2024-05-03 DIAGNOSIS — K8020 Calculus of gallbladder without cholecystitis without obstruction: Secondary | ICD-10-CM | POA: Diagnosis not present

## 2024-05-03 DIAGNOSIS — C642 Malignant neoplasm of left kidney, except renal pelvis: Secondary | ICD-10-CM | POA: Diagnosis not present

## 2024-05-03 DIAGNOSIS — C641 Malignant neoplasm of right kidney, except renal pelvis: Secondary | ICD-10-CM | POA: Diagnosis not present

## 2024-05-10 ENCOUNTER — Other Ambulatory Visit: Payer: Self-pay

## 2024-05-10 DIAGNOSIS — C9002 Multiple myeloma in relapse: Secondary | ICD-10-CM

## 2024-05-14 ENCOUNTER — Inpatient Hospital Stay: Admitting: Hematology

## 2024-05-14 ENCOUNTER — Inpatient Hospital Stay

## 2024-05-14 ENCOUNTER — Inpatient Hospital Stay: Attending: Hematology

## 2024-05-14 VITALS — BP 120/75 | HR 54 | Resp 18

## 2024-05-14 VITALS — BP 129/73 | HR 65 | Temp 98.1°F | Resp 18 | Wt 172.6 lb

## 2024-05-14 DIAGNOSIS — Z7982 Long term (current) use of aspirin: Secondary | ICD-10-CM | POA: Diagnosis not present

## 2024-05-14 DIAGNOSIS — Z79899 Other long term (current) drug therapy: Secondary | ICD-10-CM | POA: Diagnosis not present

## 2024-05-14 DIAGNOSIS — Z79624 Long term (current) use of inhibitors of nucleotide synthesis: Secondary | ICD-10-CM | POA: Insufficient documentation

## 2024-05-14 DIAGNOSIS — C9000 Multiple myeloma not having achieved remission: Secondary | ICD-10-CM | POA: Insufficient documentation

## 2024-05-14 DIAGNOSIS — Z5112 Encounter for antineoplastic immunotherapy: Secondary | ICD-10-CM | POA: Diagnosis present

## 2024-05-14 DIAGNOSIS — G4700 Insomnia, unspecified: Secondary | ICD-10-CM | POA: Diagnosis not present

## 2024-05-14 DIAGNOSIS — I129 Hypertensive chronic kidney disease with stage 1 through stage 4 chronic kidney disease, or unspecified chronic kidney disease: Secondary | ICD-10-CM | POA: Insufficient documentation

## 2024-05-14 DIAGNOSIS — J320 Chronic maxillary sinusitis: Secondary | ICD-10-CM | POA: Insufficient documentation

## 2024-05-14 DIAGNOSIS — C9002 Multiple myeloma in relapse: Secondary | ICD-10-CM | POA: Diagnosis not present

## 2024-05-14 DIAGNOSIS — N183 Chronic kidney disease, stage 3 unspecified: Secondary | ICD-10-CM | POA: Diagnosis not present

## 2024-05-14 DIAGNOSIS — Z7189 Other specified counseling: Secondary | ICD-10-CM | POA: Diagnosis not present

## 2024-05-14 DIAGNOSIS — R768 Other specified abnormal immunological findings in serum: Secondary | ICD-10-CM | POA: Insufficient documentation

## 2024-05-14 LAB — CBC WITH DIFFERENTIAL (CANCER CENTER ONLY)
Abs Immature Granulocytes: 0.02 10*3/uL (ref 0.00–0.07)
Basophils Absolute: 0.1 10*3/uL (ref 0.0–0.1)
Basophils Relative: 2 %
Eosinophils Absolute: 0.4 10*3/uL (ref 0.0–0.5)
Eosinophils Relative: 8 %
HCT: 36.9 % — ABNORMAL LOW (ref 39.0–52.0)
Hemoglobin: 12.2 g/dL — ABNORMAL LOW (ref 13.0–17.0)
Immature Granulocytes: 0 %
Lymphocytes Relative: 39 %
Lymphs Abs: 1.9 10*3/uL (ref 0.7–4.0)
MCH: 29.5 pg (ref 26.0–34.0)
MCHC: 33.1 g/dL (ref 30.0–36.0)
MCV: 89.1 fL (ref 80.0–100.0)
Monocytes Absolute: 0.7 10*3/uL (ref 0.1–1.0)
Monocytes Relative: 16 %
Neutro Abs: 1.6 10*3/uL — ABNORMAL LOW (ref 1.7–7.7)
Neutrophils Relative %: 35 %
Platelet Count: 141 10*3/uL — ABNORMAL LOW (ref 150–400)
RBC: 4.14 MIL/uL — ABNORMAL LOW (ref 4.22–5.81)
RDW: 15.9 % — ABNORMAL HIGH (ref 11.5–15.5)
WBC Count: 4.7 10*3/uL (ref 4.0–10.5)
nRBC: 0 % (ref 0.0–0.2)

## 2024-05-14 LAB — CMP (CANCER CENTER ONLY)
ALT: 12 U/L (ref 0–44)
AST: 13 U/L — ABNORMAL LOW (ref 15–41)
Albumin: 4.1 g/dL (ref 3.5–5.0)
Alkaline Phosphatase: 47 U/L (ref 38–126)
Anion gap: 6 (ref 5–15)
BUN: 15 mg/dL (ref 8–23)
CO2: 28 mmol/L (ref 22–32)
Calcium: 8.5 mg/dL — ABNORMAL LOW (ref 8.9–10.3)
Chloride: 109 mmol/L (ref 98–111)
Creatinine: 1.45 mg/dL — ABNORMAL HIGH (ref 0.61–1.24)
GFR, Estimated: 50 mL/min — ABNORMAL LOW (ref >60)
Glucose, Bld: 87 mg/dL (ref 70–99)
Potassium: 3.6 mmol/L (ref 3.5–5.1)
Sodium: 143 mmol/L (ref 135–145)
Total Bilirubin: 0.5 mg/dL (ref 0.0–1.2)
Total Protein: 6.6 g/dL (ref 6.5–8.1)

## 2024-05-14 MED ORDER — DEXAMETHASONE 4 MG PO TABS
12.0000 mg | ORAL_TABLET | Freq: Once | ORAL | Status: AC
  Administered 2024-05-14: 12 mg via ORAL
  Filled 2024-05-14: qty 3

## 2024-05-14 MED ORDER — DARATUMUMAB-HYALURONIDASE-FIHJ 1800-30000 MG-UT/15ML ~~LOC~~ SOLN
1800.0000 mg | Freq: Once | SUBCUTANEOUS | Status: AC
  Administered 2024-05-14: 1800 mg via SUBCUTANEOUS
  Filled 2024-05-14: qty 15

## 2024-05-14 MED ORDER — DENOSUMAB 120 MG/1.7ML ~~LOC~~ SOLN
120.0000 mg | Freq: Once | SUBCUTANEOUS | Status: AC
  Administered 2024-05-14: 120 mg via SUBCUTANEOUS
  Filled 2024-05-14: qty 1.7

## 2024-05-14 MED ORDER — DIPHENHYDRAMINE HCL 25 MG PO CAPS
50.0000 mg | ORAL_CAPSULE | Freq: Once | ORAL | Status: AC
  Administered 2024-05-14: 50 mg via ORAL
  Filled 2024-05-14: qty 2

## 2024-05-14 MED ORDER — ACETAMINOPHEN 325 MG PO TABS
650.0000 mg | ORAL_TABLET | Freq: Once | ORAL | Status: AC
  Administered 2024-05-14: 650 mg via ORAL
  Filled 2024-05-14: qty 2

## 2024-05-14 MED ORDER — FAMOTIDINE 20 MG PO TABS
20.0000 mg | ORAL_TABLET | Freq: Once | ORAL | Status: AC
  Administered 2024-05-14: 20 mg via ORAL
  Filled 2024-05-14: qty 1

## 2024-05-14 NOTE — Patient Instructions (Signed)
 [facility] - A DEPT OF [place]  Discharge Instructions: Thank you for choosing [facility] to provide your oncology and hematology care.   If you have a lab appointment with the Cancer Center, please go directly to the Cancer Center and check in at the registration area.   Wear comfortable clothing and clothing appropriate for easy access to any Portacath or PICC line.   We strive to give you quality time with your provider. You may need to reschedule your appointment if you arrive late (15 or more minutes).  Arriving late affects you and other patients whose appointments are after yours.  Also, if you miss three or more appointments without notifying the office, you may be dismissed from the clinic at the provider's discretion.      For prescription refill requests, have your pharmacy contact our office and allow 72 hours for refills to be completed.    Today you received the following chemotherapy and/or immunotherapy agents: Daratumumab  Faspro.       To help prevent nausea and vomiting after your treatment, we encourage you to take your nausea medication as directed.  BELOW ARE SYMPTOMS THAT SHOULD BE REPORTED IMMEDIATELY: *FEVER GREATER THAN 100.4 F (38 C) OR HIGHER *CHILLS OR SWEATING *NAUSEA AND VOMITING THAT IS NOT CONTROLLED WITH YOUR NAUSEA MEDICATION *UNUSUAL SHORTNESS OF BREATH *UNUSUAL BRUISING OR BLEEDING *URINARY PROBLEMS (pain or burning when urinating, or frequent urination) *BOWEL PROBLEMS (unusual diarrhea, constipation, pain near the anus) TENDERNESS IN MOUTH AND THROAT WITH OR WITHOUT PRESENCE OF ULCERS (sore throat, sores in mouth, or a toothache) UNUSUAL RASH, SWELLING OR PAIN  UNUSUAL VAGINAL DISCHARGE OR ITCHING   Items with * indicate a potential emergency and should be followed up as soon as possible or go to the Emergency Department if any problems should occur.  Please show the CHEMOTHERAPY ALERT CARD or  IMMUNOTHERAPY ALERT CARD at check-in to the Emergency Department and triage nurse.  Should you have questions after your visit or need to cancel or reschedule your appointment, please contact [facility] - A DEPT OF [facility]  Dept: [phone]  and follow the prompts.  Office hours are 8:00 a.m. to 4:30 p.m. Monday - Friday. Please note that voicemails left after 4:00 p.m. may not be returned until the following business day.  We are closed weekends and major holidays. You have access to a nurse at all times for urgent questions. Please call the main number to the clinic Dept: [phone] and follow the prompts.   For any non-urgent questions, you may also contact your provider using MyChart. We now offer e-Visits for anyone 20 and older to request care online for non-urgent symptoms. For details visit [email or web address].   Also download the MyChart app! Go to the app store, search "MyChart", open the app, select [place], and log in with your MyChart username and password.

## 2024-05-14 NOTE — Progress Notes (Signed)
 HEMATOLOGY/ONCOLOGY CLINIC NOTE  Date of Service: 05/14/24    Patient Care Team: Avva, Ravisankar, MD as PCP - General (Internal Medicine)  CHIEF COMPLAINTS/PURPOSE OF CONSULTATION:  F/u for continued mx of myeloma  HISTORY OF PRESENTING ILLNESS:  Marcus Hatfield is a wonderful 78 y.o. 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  Patient was seen by me as an inpatient on 05/20/2023.  He noted that his vision may be slightly better with the steroid. Had some mild insomnia but it was not too bothersome. Has been seen by radiation oncology and is going to be set up for CT simulation and to start radiation 05/21/2023.  Did have a CT chest abdomen pelvis without contrast to evaluate for any other soft tissue metastatic disease and whole-body skeletal survey to determine other overt progression and to rule out the possibility of further source of metastatic disease.  Today, he is accompanied by two family members. He reports that he is feeling well overall. He reports that his vision has improved and returned to baseline. He denies any glares in his vision which were previously present. He is able to count fingers and notes improved sight of colors on television.   His p.o. intake is normal and he notes drinking 4 16oz bottles daily. He denies any fevers, chills, night sweats, new back pain, fatigue, posterior neck pain, or abdominal pain.  He reports some nausea with previously taking Revlimid . Patient reports that he has an upcoming dental appointment.  INTERVAL HISTORY:  Marcus Hatfield is a wonderful 78 y.o. male who is here for continued evaluation and management of his multiple myeloma. He is here for cycle 13 day 1 of his treatment.   Patient was last seen by me on 04/16/2024 and complained of mild constipation, one episode of mild headache, and ingrown nail on left big toe.  He is accompanied by his son during today's  visit.  He has tolerated daratumumab  and revlimid  well with no toxicities. He has been eating and sleeping well. Patient denies any vision changes. He reports possible eye dryness.  He denies any abdominal pain, leg swelling, or infection issues. Patient complains of intermittent headaches.   He has no new respiratory symptoms.   He is on Finasteride  for enlarged prostate. He has no urinary symptoms such as discomfort passing urine or difficulty passing urine at this time.   Patient reports previous lower back pain which has improved at this time. He has no new bone pain outside of pains related to intermittent degenerative changes with age.   Patient reports that he has been able to move around more.   His small spot in the lungs is nearly resolved based on chest x-ray with urologist. He reports that his kidney cancer is stable at this time. He reports that he will see his urologist next in 1 week.    MEDICAL HISTORY:  Past Medical History:  Diagnosis Date   Anemia    Arthritis    CKD (chronic kidney disease) stage 3, GFR 30-59 ml/min (HCC) 20015   Creat 1.9   Hypertension    Multiple myeloma (HCC) 2016   [facility]   Prostate disorder 02/2017    SURGICAL HISTORY: Past Surgical History:  Procedure Laterality Date   BIOPSY  06/06/2023   Procedure: BIOPSY;  Surgeon: Daina Drum, MD;  Location: [facility] ENDOSCOPY;  Service: Gastroenterology;;   ESOPHAGOGASTRODUODENOSCOPY (EGD) WITH PROPOFOL  N/A 06/06/2023   Procedure:  ESOPHAGOGASTRODUODENOSCOPY (EGD) WITH PROPOFOL ;  Surgeon: Daina Drum, MD;  Location: [facility] ENDOSCOPY;  Service: Gastroenterology;  Laterality: N/A;   EYE SURGERY Bilateral 01/30/2018   KNEE SURGERY     over ten years ago   NEPHRECTOMY Right 03/2015   ROBOTIC ASSITED PARTIAL NEPHRECTOMY Left 07/07/2021   Procedure: XI ROBOTIC ASSITED PARTIAL NEPHRECTOMY;  Surgeon: Osborn Blaze, MD;  Location[facility];  Service: Urology;  Laterality: Left;  5 HRS   XI ROBOTIC  ASSISTED SIMPLE PROSTATECTOMY N/A 07/07/2021   Procedure: XI ROBOTIC ASSISTED SIMPLE PROSTATECTOMY;  Surgeon: Osborn Blaze, MD;  Location: [facility];  Service: Urology;  Laterality: N/A;    SOCIAL HISTORY: Social History   Socioeconomic History   Marital status: Married    Spouse name: Museum/gallery curator   Number of children: 2   Years of education: Not on file   Highest education level: Not on file  Occupational History   Occupation: Retired  Tobacco Use   Smoking status: Never   Smokeless tobacco: Never  Vaping Use   Vaping status: Never Used  Substance and Sexual Activity   Alcohol  use: No   Drug use: No   Sexual activity: Not on file  Other Topics Concern   Not on file  Social History Narrative   Not on file   Social Drivers of Health   Financial Resource Strain: Not on file  Food Insecurity: Patient Declined (06/05/2023)   Hunger Vital Sign    Worried About Running Out of Food in the Last Year: Patient declined    Ran Out of Food in the Last Year: Patient declined  Transportation Needs: Patient Declined (06/05/2023)   PRAPARE - Administrator, Civil Service (Medical): Patient declined    Lack of Transportation (Non-Medical): Patient declined  Physical Activity: Not on file  Stress: Not on file  Social Connections: Not on file  Intimate Partner Violence: Patient Declined (06/05/2023)   Humiliation, Afraid, Rape, and Kick questionnaire    Fear of Current or Ex-Partner: Patient declined    Emotionally Abused: Patient declined    Physically Abused: Patient declined    Sexually Abused: Patient declined    FAMILY HISTORY: Family History  Problem Relation Age of Onset   Diabetes Father    Hypertension Brother     ALLERGIES:  is allergic to penicillins, latex, and tape.  MEDICATIONS:  Current Outpatient Medications  Medication Sig Dispense Refill   acyclovir  (ZOVIRAX ) 400 MG tablet TAKE 1 TABLET BY MOUTH TWICE A DAY 180 tablet 3   amLODipine   (NORVASC ) 10 MG tablet Take 0.5 tablets (5 mg total) by mouth daily.     aspirin  81 MG chewable tablet Chew 81 mg by mouth daily.     carvedilol  (COREG ) 6.25 MG tablet Take 1 tablet (6.25 mg total) by mouth 2 (two) times daily with a meal.     diclofenac Sodium (VOLTAREN) 1 % GEL Apply 2 g topically daily as needed (for pain).     docusate sodium  (COLACE) 100 MG capsule Take 1 capsule (100 mg total) by mouth daily as needed for mild constipation.     ferrous sulfate  325 (65 FE) MG tablet Take 1 tablet (325 mg total) by mouth 2 (two) times daily as needed (iron).     finasteride  (PROSCAR ) 5 MG tablet Take 5 mg by mouth daily.     fluticasone  (FLONASE ) 50 MCG/ACT nasal spray Place 2 sprays into both nostrils daily as needed for allergies.     furosemide  (LASIX )  20 MG tablet Take 0.5 tablets (10 mg total) by mouth every other day. 30 tablet    lactulose  (CHRONULAC ) 10 GM/15ML solution Take 10 g by mouth daily as needed for constipation.  5   lenalidomide  (REVLIMID ) 10 MG capsule TAKE 1 CAPSULE BY MOUTH 1 TIME A DAY FOR 21 DAYS ON THEN 7 DAYS OFF 21 capsule 0   ondansetron  (ZOFRAN ) 8 MG tablet Take 1 tablet (8 mg total) by mouth every 8 (eight) hours as needed for nausea or vomiting. 30 tablet 1   oxyCODONE -acetaminophen  (PERCOCET/ROXICET) 5-325 MG tablet Take 0.5-1 tablets by mouth every 6 (six) hours as needed for severe pain (pain score 7-10). 20 tablet 0   pantoprazole  (PROTONIX ) 40 MG tablet Take 1 tablet (40 mg total) by mouth 2 (two) times daily. Take bid x 10 weeks, followed by daily thereafter 90 tablet 3   prochlorperazine  (COMPAZINE ) 10 MG tablet Take 1 tablet (10 mg total) by mouth every 6 (six) hours as needed for nausea or vomiting. 30 tablet 1   tamsulosin  (FLOMAX ) 0.4 MG CAPS capsule Take 1 capsule (0.4 mg total) by mouth daily after supper. 30 capsule 1   telmisartan (MICARDIS) 80 MG tablet Take 80 mg by mouth daily.     No current facility-administered medications for this visit.     REVIEW OF SYSTEMS:    .10 Point review of Systems was done is negative except as noted above.  PHYSICAL EXAMINATION: ECOG PERFORMANCE STATUS: 2 - Symptomatic, <50% confined to bed .BP 129/73   Pulse 65   Temp 98.1 F (36.7 C)   Resp 18   Wt 172 lb 9.6 oz (78.3 kg)   SpO2 98%   BMI 24.77 kg/m   GENERAL:alert, in no acute distress and comfortable SKIN: no acute rashes, no significant lesions EYES: conjunctiva are pink and non-injected, sclera anicteric OROPHARYNX: MMM, no exudates, no oropharyngeal erythema or ulceration NECK: supple, no JVD LYMPH:  no palpable lymphadenopathy in the cervical, axillary or inguinal regions LUNGS: clear to auscultation b/l with normal respiratory effort HEART: regular rate & rhythm ABDOMEN:  normoactive bowel sounds , non tender, not distended. Extremity: no pedal edema PSYCH: alert & oriented x 3 with fluent speech NEURO: no focal motor/sensory deficits   LABORATORY DATA:  I have reviewed the data as listed .    Latest Ref Rng & Units 05/14/2024   11:41 AM 04/16/2024   10:25 AM 03/19/2024    2:05 PM  CBC  WBC 4.0 - 10.5 K/uL 4.7  4.2  4.8   Hemoglobin 13.0 - 17.0 g/dL 16.1  09.6  04.5   Hematocrit 39.0 - 52.0 % 36.9  35.4  38.1   Platelets 150 - 400 K/uL 141  126  106    .    Latest Ref Rng & Units 05/14/2024   11:41 AM 04/16/2024   10:25 AM 03/19/2024    2:05 PM  CMP  Glucose 70 - 99 mg/dL 87  69  89   BUN 8 - 23 mg/dL 15  14  17    Creatinine 0.61 - 1.24 mg/dL 4.09  8.11  9.14   Sodium 135 - 145 mmol/L 143  142  143   Potassium 3.5 - 5.1 mmol/L 3.6  3.5  3.7   Chloride 98 - 111 mmol/L 109  109  109   CO2 22 - 32 mmol/L 28  29  28    Calcium  8.9 - 10.3 mg/dL 8.5  8.2  8.5   Total Protein  6.5 - 8.1 g/dL 6.6  6.0  6.5   Total Bilirubin 0.0 - 1.2 mg/dL 0.5  0.5  0.5   Alkaline Phos 38 - 126 U/L 47  39  47   AST 15 - 41 U/L 13  12  21    ALT 0 - 44 U/L 12  10  13       RADIOGRAPHIC STUDIES: I have personally reviewed the  radiological images as listed and agreed with the findings in the report. DG Chest 2 View Result Date: 04/25/2024 CLINICAL DATA:  Provided history is "history of renal cell carcinoma". Based on a PET-CT from 12/22/2023, patient has multiple myeloma. EXAM: CHEST - 2 VIEW COMPARISON:  08/31/2023.  CT, 02/27/2024.  PET-CT, 12/22/2023. FINDINGS: Cardiac silhouette is normal in size and configuration. Prominent thoracic aorta. No mediastinal or hilar masses. No evidence of adenopathy. Is Opacity in the medial right upper lobe is stable consistent with scarring. Remainder of the lungs is clear. No pleural effusion or pneumothorax. Stable old right-sided rib fractures. Lucent lesion seen on the previous CT are not well-defined radiographically. No acute skeletal abnormality. IMPRESSION: 1. No active cardiopulmonary disease. Electronically Signed   By: Amanda Jungling M.D.   On: 04/25/2024 10:48      ASSESSMENT & PLAN:   78year-old male with   #1 Progressive IgG kappa myeloma: Initially diagnosed in 2016 as stage II disease with standard risk cytogenetics today. 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 Status post treatment as noted above. Last seen by Dr. Marven Slimmer at [facility] in January 2024 and noted to have progressive disease with increase in his IgG kappa M spike at 2.87 g/dL.   Recent light chains done on 05/20/2023 at [facility] showed increase in free kappa light chains to 603 with a kappa lambda ratio of 25 this is up from kappa light chains of 402 with a kappa lambda ratio of 12.6 on 01/03/2023. CBC shows relatively stable hemoglobin of 11.3 with normal WBC count and platelets BMP shows stable chronic kidney disease with a creatinine of 1.87.  No overt hypercalcemia with a calcium  of 9   #2 recent right clavicular pain with x-ray on 05/06/2023 showing mildly displaced likely pathologic fracture of the medial right clavicle.  Multiple other lytic lesions noted.   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  CT head done today 05/20/2023 shows  Large mass at the skull base, eroding and expanding the clivus, also involving the sphenoid bone and the petrous portions of both temporal bones. The mass broadly abuts both optic nerves between the chiasm and orbital apices. Mass also broadly involves both cavernous sinuses. 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 Recommend further assessment with brain MRI without and with contrast.   MRI brain 05/20/2023 1. 7.8 x 5.6 x 3.3 cm enhancing mass lesion at the skull base extending along the petrous ridge bilaterally, greater on the right. 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 There are some intrinsic T2 signal scratched at there is some intrinsic T2 signal and chordoma still considered. However, given the multiple other enhancing lesions throughout the skull, this most likely represents metastatic disease. 2. No acute intracranial abnormality. 3. Mild atrophy and white matter changes are likely within normal limits for age. 4. Right mastoid effusion secondary to obstruction of the eustachian  tube. 5. Minimal right maxillary sinus disease.   ADDENDUM: Lytic lesions have been identified previously with this patient. 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  #5 history of renal cell carcinoma right nephrectomy on 02/03/2016.  He received cryotherapy to his left renal mass in April 2017. Surgically resected by Dr. Osborn Blaze at [facility] on July 07, 2021. 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 Tumor is limited to the kidney (pT1a).   PLAN:  -Discussed lab results on 05/14/24 in detail with patient. CBC showed WBC of 4.7K,  hemoglobin of 12.2, and platelets of 141K. -No findings of abnormal M protein on lab testing from 1 month ago -Patient has been tolerating his maintenance Daratumumab  treatment well without any new or severe toxicities.  -continue maintenance daratumumab  once a month -Patient has been tolerating his current dose of Revlimid  10 mg well without any new or severe toxicities.  continue Revlimid  10 mg  -continue aspirin  81 mg p.o. daily for VTE prophylaxis  -continue acyclovir  for shingles prophylaxis  -continue to follow up with eye doctor and urologist -patient shall return to clinic with us  in 2 months -answered all of patient's questions in detail -patient inquires about possible travel plans -discussed that if there are plans for traveling internationally, there may be a role for antibiotics as needed. If there are plans for long-distance flights, I would recommended staying well-hydrated, wearing compression socks, and walking around every hour to reduce the risk of blood clots.  -we will give patient our recommendations if there are plans for travel and he shall let us  know  FOLLOW-UP: Continue monthly daratumumab  with labs as per integrated scheduling MD visit every other treatment I.e. every 8 weeks  The total time spent in the appointment was 32 minutes* .  All of the patient's questions were answered with apparent satisfaction. The patient knows to call the clinic with any problems, questions or concerns.   Jacquelyn Matt MD MS AAHIVMS [facility] Hematology/Oncology Physician [facility]  .*Total Encounter Time as defined by the Centers for Medicare and Medicaid Services includes, in addition to the face-to-face time of a patient visit (documented in the note above) non-face-to-face time: obtaining and reviewing outside history, ordering and reviewing medications, tests or procedures, care coordination (communications with other health care professionals or caregivers) and  documentation in the medical record.    I,Mitra Faeizi,acting as a Neurosurgeon for Jacquelyn Matt, MD.,have documented all relevant documentation on the behalf of Jacquelyn Matt, MD,as directed by  Jacquelyn Matt, MD while in the presence of Jacquelyn Matt, MD.  .I have reviewed the above documentation for accuracy and completeness, and I agree with the above. .Sunil Hue Kishore Simonne Boulos MD

## 2024-05-15 LAB — KAPPA/LAMBDA LIGHT CHAINS
Kappa free light chain: 24.2 mg/L — ABNORMAL HIGH (ref 3.3–19.4)
Kappa, lambda light chain ratio: 1.05 (ref 0.26–1.65)
Lambda free light chains: 23.1 mg/L (ref 5.7–26.3)

## 2024-05-16 ENCOUNTER — Other Ambulatory Visit: Payer: Self-pay

## 2024-05-16 LAB — MULTIPLE MYELOMA PANEL, SERUM
Albumin SerPl Elph-Mcnc: 3.6 g/dL (ref 2.9–4.4)
Albumin/Glob SerPl: 1.5 (ref 0.7–1.7)
Alpha 1: 0.2 g/dL (ref 0.0–0.4)
Alpha2 Glob SerPl Elph-Mcnc: 0.7 g/dL (ref 0.4–1.0)
B-Globulin SerPl Elph-Mcnc: 0.8 g/dL (ref 0.7–1.3)
Gamma Glob SerPl Elph-Mcnc: 0.8 g/dL (ref 0.4–1.8)
Globulin, Total: 2.5 g/dL (ref 2.2–3.9)
IgA: 114 mg/dL (ref 61–437)
IgG (Immunoglobin G), Serum: 872 mg/dL (ref 603–1613)
IgM (Immunoglobulin M), Srm: 9 mg/dL — ABNORMAL LOW (ref 15–143)
Total Protein ELP: 6.1 g/dL (ref 6.0–8.5)

## 2024-05-17 ENCOUNTER — Other Ambulatory Visit: Payer: Self-pay | Admitting: Hematology

## 2024-05-17 DIAGNOSIS — C9002 Multiple myeloma in relapse: Secondary | ICD-10-CM

## 2024-05-19 ENCOUNTER — Other Ambulatory Visit: Payer: Self-pay

## 2024-05-19 ENCOUNTER — Encounter: Payer: Self-pay | Admitting: Hematology

## 2024-05-20 ENCOUNTER — Encounter: Payer: Self-pay | Admitting: Hematology

## 2024-05-20 ENCOUNTER — Other Ambulatory Visit: Payer: Self-pay

## 2024-05-20 DIAGNOSIS — C642 Malignant neoplasm of left kidney, except renal pelvis: Secondary | ICD-10-CM | POA: Diagnosis not present

## 2024-05-20 DIAGNOSIS — N5201 Erectile dysfunction due to arterial insufficiency: Secondary | ICD-10-CM | POA: Diagnosis not present

## 2024-05-20 DIAGNOSIS — N138 Other obstructive and reflux uropathy: Secondary | ICD-10-CM | POA: Diagnosis not present

## 2024-05-20 DIAGNOSIS — C641 Malignant neoplasm of right kidney, except renal pelvis: Secondary | ICD-10-CM | POA: Diagnosis not present

## 2024-06-08 ENCOUNTER — Other Ambulatory Visit: Payer: Self-pay

## 2024-06-11 ENCOUNTER — Inpatient Hospital Stay

## 2024-06-11 ENCOUNTER — Inpatient Hospital Stay: Attending: Hematology

## 2024-06-11 VITALS — BP 105/63 | HR 50 | Temp 97.6°F | Resp 18 | Wt 171.8 lb

## 2024-06-11 DIAGNOSIS — Z7189 Other specified counseling: Secondary | ICD-10-CM

## 2024-06-11 DIAGNOSIS — C9002 Multiple myeloma in relapse: Secondary | ICD-10-CM

## 2024-06-11 DIAGNOSIS — Z5112 Encounter for antineoplastic immunotherapy: Secondary | ICD-10-CM | POA: Diagnosis present

## 2024-06-11 DIAGNOSIS — C9000 Multiple myeloma not having achieved remission: Secondary | ICD-10-CM | POA: Insufficient documentation

## 2024-06-11 LAB — COMPREHENSIVE METABOLIC PANEL WITH GFR
ALT: 11 U/L (ref 0–44)
AST: 12 U/L — ABNORMAL LOW (ref 15–41)
Albumin: 3.9 g/dL (ref 3.5–5.0)
Alkaline Phosphatase: 43 U/L (ref 38–126)
Anion gap: 5 (ref 5–15)
BUN: 15 mg/dL (ref 8–23)
CO2: 26 mmol/L (ref 22–32)
Calcium: 8.1 mg/dL — ABNORMAL LOW (ref 8.9–10.3)
Chloride: 110 mmol/L (ref 98–111)
Creatinine, Ser: 1.58 mg/dL — ABNORMAL HIGH (ref 0.61–1.24)
GFR, Estimated: 45 mL/min — ABNORMAL LOW (ref >60)
Glucose, Bld: 95 mg/dL (ref 70–99)
Potassium: 3.7 mmol/L (ref 3.5–5.1)
Sodium: 141 mmol/L (ref 135–145)
Total Bilirubin: 0.6 mg/dL (ref 0.0–1.2)
Total Protein: 6.3 g/dL — ABNORMAL LOW (ref 6.5–8.1)

## 2024-06-11 LAB — CBC WITH DIFFERENTIAL (CANCER CENTER ONLY)
Abs Immature Granulocytes: 0 10*3/uL (ref 0.00–0.07)
Basophils Absolute: 0.1 10*3/uL (ref 0.0–0.1)
Basophils Relative: 1 %
Eosinophils Absolute: 0.2 10*3/uL (ref 0.0–0.5)
Eosinophils Relative: 4 %
HCT: 35.2 % — ABNORMAL LOW (ref 39.0–52.0)
Hemoglobin: 11.7 g/dL — ABNORMAL LOW (ref 13.0–17.0)
Immature Granulocytes: 0 %
Lymphocytes Relative: 38 %
Lymphs Abs: 1.6 10*3/uL (ref 0.7–4.0)
MCH: 29.4 pg (ref 26.0–34.0)
MCHC: 33.2 g/dL (ref 30.0–36.0)
MCV: 88.4 fL (ref 80.0–100.0)
Monocytes Absolute: 0.6 10*3/uL (ref 0.1–1.0)
Monocytes Relative: 14 %
Neutro Abs: 1.8 10*3/uL (ref 1.7–7.7)
Neutrophils Relative %: 43 %
Platelet Count: 99 10*3/uL — ABNORMAL LOW (ref 150–400)
RBC: 3.98 MIL/uL — ABNORMAL LOW (ref 4.22–5.81)
RDW: 15.6 % — ABNORMAL HIGH (ref 11.5–15.5)
WBC Count: 4.3 10*3/uL (ref 4.0–10.5)
nRBC: 0 % (ref 0.0–0.2)

## 2024-06-11 MED ORDER — FAMOTIDINE 20 MG PO TABS
20.0000 mg | ORAL_TABLET | Freq: Once | ORAL | Status: AC
  Administered 2024-06-11: 20 mg via ORAL
  Filled 2024-06-11: qty 1

## 2024-06-11 MED ORDER — DIPHENHYDRAMINE HCL 25 MG PO CAPS
50.0000 mg | ORAL_CAPSULE | Freq: Once | ORAL | Status: AC
  Administered 2024-06-11: 50 mg via ORAL
  Filled 2024-06-11: qty 2

## 2024-06-11 MED ORDER — DARATUMUMAB-HYALURONIDASE-FIHJ 1800-30000 MG-UT/15ML ~~LOC~~ SOLN
1800.0000 mg | Freq: Once | SUBCUTANEOUS | Status: AC
  Administered 2024-06-11: 1800 mg via SUBCUTANEOUS
  Filled 2024-06-11: qty 15

## 2024-06-11 MED ORDER — ACETAMINOPHEN 325 MG PO TABS
650.0000 mg | ORAL_TABLET | Freq: Once | ORAL | Status: AC
  Administered 2024-06-11: 650 mg via ORAL
  Filled 2024-06-11: qty 2

## 2024-06-11 MED ORDER — DENOSUMAB 120 MG/1.7ML ~~LOC~~ SOLN
120.0000 mg | Freq: Once | SUBCUTANEOUS | Status: AC
  Administered 2024-06-11: 120 mg via SUBCUTANEOUS
  Filled 2024-06-11: qty 1.7

## 2024-06-11 MED ORDER — DEXAMETHASONE 4 MG PO TABS
12.0000 mg | ORAL_TABLET | Freq: Once | ORAL | Status: AC
  Administered 2024-06-11: 12 mg via ORAL
  Filled 2024-06-11: qty 3

## 2024-06-11 NOTE — Progress Notes (Signed)
 Per Dr Onesimo ok to treat with creatnine 1.58 & platelets 99 today

## 2024-06-11 NOTE — Patient Instructions (Signed)
 [facility] - A DEPT OF [place]  Discharge Instructions: Thank you for choosing [facility] to provide your oncology and hematology care.   If you have a lab appointment with the Cancer Center, please go directly to the Cancer Center and check in at the registration area.   Wear comfortable clothing and clothing appropriate for easy access to any Portacath or PICC line.   We strive to give you quality time with your provider. You may need to reschedule your appointment if you arrive late (15 or more minutes).  Arriving late affects you and other patients whose appointments are after yours.  Also, if you miss three or more appointments without notifying the office, you may be dismissed from the clinic at the provider's discretion.      For prescription refill requests, have your pharmacy contact our office and allow 72 hours for refills to be completed.    Today you received the following chemotherapy and/or immunotherapy agents Darzalex  Faspro & Xgeva       To help prevent nausea and vomiting after your treatment, we encourage you to take your nausea medication as directed.  BELOW ARE SYMPTOMS THAT SHOULD BE REPORTED IMMEDIATELY: *FEVER GREATER THAN 100.4 F (38 C) OR HIGHER *CHILLS OR SWEATING *NAUSEA AND VOMITING THAT IS NOT CONTROLLED WITH YOUR NAUSEA MEDICATION *UNUSUAL SHORTNESS OF BREATH *UNUSUAL BRUISING OR BLEEDING *URINARY PROBLEMS (pain or burning when urinating, or frequent urination) *BOWEL PROBLEMS (unusual diarrhea, constipation, pain near the anus) TENDERNESS IN MOUTH AND THROAT WITH OR WITHOUT PRESENCE OF ULCERS (sore throat, sores in mouth, or a toothache) UNUSUAL RASH, SWELLING OR PAIN  UNUSUAL VAGINAL DISCHARGE OR ITCHING   Items with * indicate a potential emergency and should be followed up as soon as possible or go to the Emergency Department if any problems should occur.  Please show the CHEMOTHERAPY ALERT CARD or  IMMUNOTHERAPY ALERT CARD at check-in to the Emergency Department and triage nurse.  Should you have questions after your visit or need to cancel or reschedule your appointment, please contact [facility] DE[facility]  Dept: [phone]  and follow the prompts.  Office hours are 8:00 a.m. to 4:30 p.m. Monday - Friday. Please note that voicemails left after 4:00 p.m. may not be returned until the following business day.  We are closed weekends and major holidays. You have access to a nurse at all times for urgent questions. Please call the main number to the clinic Dept: [phone] and follow the prompts.   For any non-urgent questions, you may also contact your provider using MyChart. We now offer e-Visits for anyone 65 and older to request care online for non-urgent symptoms. For details visit [email or web address].   Also download the MyChart app! Go to the app store, search MyChart, open the app, select [place], and log in with your MyChart username and password.

## 2024-06-11 NOTE — Progress Notes (Signed)
 Ok to proceed w/ Xgeva  per Dr. Onesimo.  Corrected Ca = 8.18.  Josslin Sanjuan, Pharm.D., CPP 06/11/2024@2 :47 PM

## 2024-06-12 LAB — KAPPA/LAMBDA LIGHT CHAINS
Kappa free light chain: 19 mg/L (ref 3.3–19.4)
Kappa, lambda light chain ratio: 0.94 (ref 0.26–1.65)
Lambda free light chains: 20.2 mg/L (ref 5.7–26.3)

## 2024-06-13 LAB — MULTIPLE MYELOMA PANEL, SERUM
Albumin SerPl Elph-Mcnc: 3.5 g/dL (ref 2.9–4.4)
Albumin/Glob SerPl: 1.6 (ref 0.7–1.7)
Alpha 1: 0.2 g/dL (ref 0.0–0.4)
Alpha2 Glob SerPl Elph-Mcnc: 0.6 g/dL (ref 0.4–1.0)
B-Globulin SerPl Elph-Mcnc: 0.7 g/dL (ref 0.7–1.3)
Gamma Glob SerPl Elph-Mcnc: 0.7 g/dL (ref 0.4–1.8)
Globulin, Total: 2.2 g/dL (ref 2.2–3.9)
IgA: 92 mg/dL (ref 61–437)
IgG (Immunoglobin G), Serum: 770 mg/dL (ref 603–1613)
IgM (Immunoglobulin M), Srm: 8 mg/dL — ABNORMAL LOW (ref 15–143)
Total Protein ELP: 5.7 g/dL — ABNORMAL LOW (ref 6.0–8.5)

## 2024-06-18 ENCOUNTER — Other Ambulatory Visit: Payer: Self-pay | Admitting: Hematology

## 2024-06-18 DIAGNOSIS — C9002 Multiple myeloma in relapse: Secondary | ICD-10-CM

## 2024-07-10 ENCOUNTER — Inpatient Hospital Stay: Attending: Hematology

## 2024-07-10 ENCOUNTER — Inpatient Hospital Stay

## 2024-07-10 ENCOUNTER — Inpatient Hospital Stay (HOSPITAL_BASED_OUTPATIENT_CLINIC_OR_DEPARTMENT_OTHER): Admitting: Physician Assistant

## 2024-07-10 VITALS — BP 121/71 | HR 98 | Temp 98.0°F | Resp 18 | Ht 70.0 in | Wt 169.1 lb

## 2024-07-10 DIAGNOSIS — H547 Unspecified visual loss: Secondary | ICD-10-CM | POA: Insufficient documentation

## 2024-07-10 DIAGNOSIS — N183 Chronic kidney disease, stage 3 unspecified: Secondary | ICD-10-CM | POA: Insufficient documentation

## 2024-07-10 DIAGNOSIS — Z5111 Encounter for antineoplastic chemotherapy: Secondary | ICD-10-CM

## 2024-07-10 DIAGNOSIS — Z923 Personal history of irradiation: Secondary | ICD-10-CM | POA: Insufficient documentation

## 2024-07-10 DIAGNOSIS — Z5112 Encounter for antineoplastic immunotherapy: Secondary | ICD-10-CM | POA: Diagnosis present

## 2024-07-10 DIAGNOSIS — Z79624 Long term (current) use of inhibitors of nucleotide synthesis: Secondary | ICD-10-CM | POA: Diagnosis not present

## 2024-07-10 DIAGNOSIS — Z79899 Other long term (current) drug therapy: Secondary | ICD-10-CM | POA: Insufficient documentation

## 2024-07-10 DIAGNOSIS — C9002 Multiple myeloma in relapse: Secondary | ICD-10-CM

## 2024-07-10 DIAGNOSIS — I129 Hypertensive chronic kidney disease with stage 1 through stage 4 chronic kidney disease, or unspecified chronic kidney disease: Secondary | ICD-10-CM | POA: Insufficient documentation

## 2024-07-10 DIAGNOSIS — Z7189 Other specified counseling: Secondary | ICD-10-CM

## 2024-07-10 DIAGNOSIS — C9000 Multiple myeloma not having achieved remission: Secondary | ICD-10-CM | POA: Insufficient documentation

## 2024-07-10 DIAGNOSIS — D649 Anemia, unspecified: Secondary | ICD-10-CM | POA: Diagnosis not present

## 2024-07-10 DIAGNOSIS — M129 Arthropathy, unspecified: Secondary | ICD-10-CM | POA: Insufficient documentation

## 2024-07-10 DIAGNOSIS — Z7982 Long term (current) use of aspirin: Secondary | ICD-10-CM | POA: Insufficient documentation

## 2024-07-10 LAB — CBC WITH DIFFERENTIAL (CANCER CENTER ONLY)
Abs Immature Granulocytes: 0.01 K/uL (ref 0.00–0.07)
Basophils Absolute: 0.1 K/uL (ref 0.0–0.1)
Basophils Relative: 3 %
Eosinophils Absolute: 0.3 K/uL (ref 0.0–0.5)
Eosinophils Relative: 8 %
HCT: 34.7 % — ABNORMAL LOW (ref 39.0–52.0)
Hemoglobin: 11.6 g/dL — ABNORMAL LOW (ref 13.0–17.0)
Immature Granulocytes: 0 %
Lymphocytes Relative: 45 %
Lymphs Abs: 1.8 K/uL (ref 0.7–4.0)
MCH: 30 pg (ref 26.0–34.0)
MCHC: 33.4 g/dL (ref 30.0–36.0)
MCV: 89.7 fL (ref 80.0–100.0)
Monocytes Absolute: 0.5 K/uL (ref 0.1–1.0)
Monocytes Relative: 13 %
Neutro Abs: 1.2 K/uL — ABNORMAL LOW (ref 1.7–7.7)
Neutrophils Relative %: 31 %
Platelet Count: 111 K/uL — ABNORMAL LOW (ref 150–400)
RBC: 3.87 MIL/uL — ABNORMAL LOW (ref 4.22–5.81)
RDW: 15.9 % — ABNORMAL HIGH (ref 11.5–15.5)
WBC Count: 3.9 K/uL — ABNORMAL LOW (ref 4.0–10.5)
nRBC: 0 % (ref 0.0–0.2)

## 2024-07-10 LAB — COMPREHENSIVE METABOLIC PANEL WITH GFR
ALT: 12 U/L (ref 0–44)
AST: 12 U/L — ABNORMAL LOW (ref 15–41)
Albumin: 3.8 g/dL (ref 3.5–5.0)
Alkaline Phosphatase: 44 U/L (ref 38–126)
Anion gap: 4 — ABNORMAL LOW (ref 5–15)
BUN: 15 mg/dL (ref 8–23)
CO2: 26 mmol/L (ref 22–32)
Calcium: 7.9 mg/dL — ABNORMAL LOW (ref 8.9–10.3)
Chloride: 110 mmol/L (ref 98–111)
Creatinine, Ser: 1.59 mg/dL — ABNORMAL HIGH (ref 0.61–1.24)
GFR, Estimated: 44 mL/min — ABNORMAL LOW (ref >60)
Glucose, Bld: 92 mg/dL (ref 70–99)
Potassium: 3.9 mmol/L (ref 3.5–5.1)
Sodium: 140 mmol/L (ref 135–145)
Total Bilirubin: 0.5 mg/dL (ref 0.0–1.2)
Total Protein: 6.1 g/dL — ABNORMAL LOW (ref 6.5–8.1)

## 2024-07-10 MED ORDER — DEXAMETHASONE 4 MG PO TABS
12.0000 mg | ORAL_TABLET | Freq: Once | ORAL | Status: AC
Start: 2024-07-10 — End: 2024-07-10
  Administered 2024-07-10: 12 mg via ORAL
  Filled 2024-07-10: qty 3

## 2024-07-10 MED ORDER — ACETAMINOPHEN 325 MG PO TABS
650.0000 mg | ORAL_TABLET | Freq: Once | ORAL | Status: AC
  Administered 2024-07-10: 650 mg via ORAL
  Filled 2024-07-10: qty 2

## 2024-07-10 MED ORDER — DIPHENHYDRAMINE HCL 25 MG PO CAPS
50.0000 mg | ORAL_CAPSULE | Freq: Once | ORAL | Status: AC
  Administered 2024-07-10: 50 mg via ORAL
  Filled 2024-07-10: qty 2

## 2024-07-10 MED ORDER — FAMOTIDINE 20 MG PO TABS
20.0000 mg | ORAL_TABLET | Freq: Once | ORAL | Status: AC
  Administered 2024-07-10: 20 mg via ORAL
  Filled 2024-07-10: qty 1

## 2024-07-10 MED ORDER — DENOSUMAB 120 MG/1.7ML ~~LOC~~ SOLN
120.0000 mg | Freq: Once | SUBCUTANEOUS | Status: AC
Start: 2024-07-10 — End: 2024-07-10
  Administered 2024-07-10: 120 mg via SUBCUTANEOUS
  Filled 2024-07-10: qty 1.7

## 2024-07-10 MED ORDER — DARATUMUMAB-HYALURONIDASE-FIHJ 1800-30000 MG-UT/15ML ~~LOC~~ SOLN
1800.0000 mg | Freq: Once | SUBCUTANEOUS | Status: AC
  Administered 2024-07-10: 1800 mg via SUBCUTANEOUS
  Filled 2024-07-10: qty 15

## 2024-07-10 NOTE — Patient Instructions (Signed)

## 2024-07-10 NOTE — Progress Notes (Signed)
Per Georga Kaufmann, PA, ok to treat with low ANC

## 2024-07-10 NOTE — Progress Notes (Signed)
 Ok to proceed with Xgeva  with corrected Ca=8.1 today per Johnston Police, PA-C.  Yuto Cajuste, PharmD, MBA

## 2024-07-10 NOTE — Progress Notes (Signed)
 HEMATOLOGY/ONCOLOGY CLINIC NOTE  Date of Service: 07/10/24   Patient Care Team: Avva, Ravisankar, MD as PCP - General (Internal Medicine)  CHIEF COMPLAINTS/PURPOSE OF CONSULTATION:  IgG kappa mutliple myeloma with base of skull plasmacytoma  TREATMENT HISTORY 05/23/2016: Induction chemotherapy with bortezomib 1.3 mg/m2 weekly and dexamethasone  40 mg weekly using 28-day cycles. Plans to add lenalidomide  and Zometa  with cycle 2 (06/20/2016).  07/18/2016: Added lenalidomide  10 mg days 1-21 out of 28 days, delayed starting due to insurance. Started lenalidomide  on 07/18/2016 with cycle 3. Achieved a PR after 4 cycles of treatment (2 cycles containing lenalidomide ). 11/14/2016: Treatment changed to carfilzomib  (D1/8/15) and dexamethasone  to achieve a deeper response after having plateaued on prior regimen.  01/24/2017: Bone marrow biopsy showed no increased plasma cells but was positive for Congo red stain. Diagnosis of amyloid was made (AL pending confirmation with mass spect). 02/01/2017: Cyclophosphamide added to existing regimen on D1/8/15 to deepen response and due to amyloid deposits found on bone marrow biopsy. 06/19/2017: Transitioned to D1/15 dosing in an attempt to transition to maintenance. His M-spike was 0.2 g/dL, consistent with a PR but close to VGPR. 10/02/2017: Discontinued dexamethasone  but continued on cyclophosphamide and carfilzomib  after showing persistent stable disease with M-spike 0.29 g/dL. 10/2017: Marcus Hatfield held all therapy starting in 10/2017 since he believed it was not needed. A long discussion was had with him during visit in 04/2018 regarding concern for chemical relapse but he refused to get labs drawn and did not show interest in resuming some type of therapy.  07/16/2018 - 06/11/2019: He achieved an unconfirmed remission (VGPR) when re-assessed and maintained no detectable disease by serology since while on surveillance. 10/07/2019: Routine serology revealed  a new M-spike of 0.56 g/dL with IFIX IgG kappa. Patient maintained on surveillance with repeat labs planned for 11/06/2019. 11/06/2019: Confirmed chemical relapse with 2 consecutive positive m-spikes (0.56 and 0.47). A repeat serology from 12/16/19 showed an M-spike of 0.86 and restaging was was performed with bone marrow biopsy and PETCT scan in 12/2019, which showed 3% kappa restricted plasma cells and no evidence of new/active lesions. Patient remained on active surveillance per his request.  05/20/2023: Underwent CT head due to vision disturbance to the left eye x 3 weeks. Findings included large mass at the skull base, eroding and expanding the clivus,also involving the sphenoid bone and the petrous portions of both temporal bones. The mass broadly abuts both optic nerves between the chiasm and orbital apices. Mass also broadly involves both cavernous sinuses. 05/20/2023: MRI brain: 7.8 x 5.6 x 3.3 cm enhancing mass lesion at the skull base extending along the petrous ridge bilaterally, greater on the right. 05/21/2023: CT CAP: Numerous lytic osseous lesions, consistent with patient's history of multiple myeloma. A large lucent lesion of the T6 vertebral body involves the posterior cortex, extension into the spinal canal can not be excluded 05/22/2023-06/02/2023: Received palliative radiation to base of skull and medial right clavicle. 06/13/2023: Started Cycle 1, Day 1 of Daratumumab /Revlimid /Dex.  06/19/2023: Recommend to hold Revlimid  as patient cannot take aspirin  therapy due to recent GI bleed. 09/19/2023: Resumed Revlimid  10 mg.    INTERVAL HISTORY: Marcus Hatfield is a 78 y.o. male who is here for continued evaluation and management of multiple myeloma with base of skull plasmacytoma. He was last seen by Dr. Onesimo on 05/14/2024. In the interim, he denies any changes to his health.   [name] reports he is tolerating his treatment without any issues.  His energy and appetite  are overall stable. He is  having some sinusitis with sore throat. He was prescribed nasonex with improvement of symptoms. He denies nausea, vomiting or bowel habit changes. He denies easy bruising or signs of active bleeding.  He denies fevers, chills, sweats, shortness of breath, chest pain or cough. He has no other complaints.   MEDICAL HISTORY:  Past Medical History:  Diagnosis Date   Anemia    Arthritis    CKD (chronic kidney disease) stage 3, GFR 30-59 ml/min (HCC) 20015   Creat 1.9   Hypertension    Multiple myeloma (HCC) 2016   [facility]   Prostate disorder 02/2017    SURGICAL HISTORY: Past Surgical History:  Procedure Laterality Date   BIOPSY  06/06/2023   Procedure: BIOPSY;  Surgeon: Marcus Rosario BROCKS, MD;  Location: [facility] ENDOSCOPY;  Service: Gastroenterology;;   ESOPHAGOGASTRODUODENOSCOPY (EGD) WITH PROPOFOL  N/A 06/06/2023   Procedure: ESOPHAGOGASTRODUODENOSCOPY (EGD) WITH PROPOFOL ;  Surgeon: Marcus Rosario BROCKS, MD;  Location: [facility] ENDOSCOPY;  Service: Gastroenterology;  Laterality: N/A;   EYE SURGERY Bilateral 01/30/2018   KNEE SURGERY     over ten years ago   NEPHRECTOMY Right 03/2015   ROBOTIC ASSITED PARTIAL NEPHRECTOMY Left 07/07/2021   Procedure: XI ROBOTIC ASSITED PARTIAL NEPHRECTOMY;  Surgeon: Marcus Hummer, MD;  Location: [facility] ORS;  Service: Urology;  Laterality: Left;  5 HRS   XI ROBOTIC ASSISTED SIMPLE PROSTATECTOMY N/A 07/07/2021   Procedure: XI ROBOTIC ASSISTED SIMPLE PROSTATECTOMY;  Surgeon: Marcus Hummer, MD;  Location: [facility] ORS;  Service: Urology;  Laterality: N/A;    SOCIAL HISTORY: Social History   Socioeconomic History   Marital status: Married    Spouse name: Museum/gallery curator   Number of children: 2   Years of education: Not on file   Highest education level: Not on file  Occupational History   Occupation: Retired  Tobacco Use   Smoking status: Never   Smokeless tobacco: Never  Vaping Use   Vaping status: Never Used  Substance and Sexual Activity   Alcohol  use: No   Drug  use: No   Sexual activity: Not on file  Other Topics Concern   Not on file  Social History Narrative   Not on file   Social Drivers of Health   Financial Resource Strain: Not on file  Food Insecurity: Patient Declined (06/05/2023)   Hunger Vital Sign    Worried About Running Out of Food in the Last Year: Patient declined    Ran Out of Food in the Last Year: Patient declined  Transportation Needs: Patient Declined (06/05/2023)   PRAPARE - Administrator, Civil Service (Medical): Patient declined    Lack of Transportation (Non-Medical): Patient declined  Physical Activity: Not on file  Stress: Not on file  Social Connections: Not on file  Intimate Partner Violence: Patient Declined (06/05/2023)   Humiliation, Afraid, Rape, and Kick questionnaire    Fear of Current or Ex-Partner: Patient declined    Emotionally Abused: Patient declined    Physically Abused: Patient declined    Sexually Abused: Patient declined    FAMILY HISTORY: Family History  Problem Relation Age of Onset   Diabetes Father    Hypertension Brother     ALLERGIES:  is allergic to penicillins, latex, and tape.  MEDICATIONS:  Current Outpatient Medications  Medication Sig Dispense Refill   acyclovir  (ZOVIRAX ) 400 MG tablet TAKE 1 TABLET BY MOUTH TWICE A DAY 180 tablet 3   amLODipine  (NORVASC ) 10 MG tablet Take 0.5 tablets (5  mg total) by mouth daily.     aspirin  81 MG chewable tablet Chew 81 mg by mouth daily.     carvedilol  (COREG ) 6.25 MG tablet Take 1 tablet (6.25 mg total) by mouth 2 (two) times daily with a meal.     diclofenac Sodium (VOLTAREN) 1 % GEL Apply 2 g topically daily as needed (for pain).     docusate sodium  (COLACE) 100 MG capsule Take 1 capsule (100 mg total) by mouth daily as needed for mild constipation.     ferrous sulfate  325 (65 FE) MG tablet Take 1 tablet (325 mg total) by mouth 2 (two) times daily as needed (iron).     finasteride  (PROSCAR ) 5 MG tablet Take 5 mg by mouth  daily.     fluticasone  (FLONASE ) 50 MCG/ACT nasal spray Place 2 sprays into both nostrils daily as needed for allergies.     furosemide  (LASIX ) 20 MG tablet Take 0.5 tablets (10 mg total) by mouth every other day. 30 tablet    lactulose  (CHRONULAC ) 10 GM/15ML solution Take 10 g by mouth daily as needed for constipation.  5   lenalidomide  (REVLIMID ) 10 MG capsule TAKE 1 CAPSULE BY MOUTH 1 TIME A DAY FOR 21 DAYS ON THEN 7 DAYS OFF 21 capsule 0   ondansetron  (ZOFRAN ) 8 MG tablet Take 1 tablet (8 mg total) by mouth every 8 (eight) hours as needed for nausea or vomiting. 30 tablet 1   oxyCODONE -acetaminophen  (PERCOCET/ROXICET) 5-325 MG tablet Take 0.5-1 tablets by mouth every 6 (six) hours as needed for severe pain (pain score 7-10). 20 tablet 0   pantoprazole  (PROTONIX ) 40 MG tablet Take 1 tablet (40 mg total) by mouth 2 (two) times daily. Take bid x 10 weeks, followed by daily thereafter 90 tablet 3   prochlorperazine  (COMPAZINE ) 10 MG tablet Take 1 tablet (10 mg total) by mouth every 6 (six) hours as needed for nausea or vomiting. 30 tablet 1   tamsulosin  (FLOMAX ) 0.4 MG CAPS capsule Take 1 capsule (0.4 mg total) by mouth daily after supper. 30 capsule 1   telmisartan (MICARDIS) 80 MG tablet Take 80 mg by mouth daily.     No current facility-administered medications for this visit.   Facility-Administered Medications Ordered in Other Visits  Medication Dose Route Frequency Provider Last Rate Last Admin   acetaminophen  (TYLENOL ) tablet 650 mg  650 mg Oral Once Kale, Gautam Kishore, MD       daratumumab -hyaluronidase -fihj (DARZALEX  FASPRO) 1800-30000 MG-UT/15ML chemo SQ injection 1,800 mg  1,800 mg Subcutaneous Once Kale, Gautam Kishore, MD       dexamethasone  (DECADRON ) tablet 12 mg  12 mg Oral Once Kale, Gautam Kishore, MD       diphenhydrAMINE  (BENADRYL ) capsule 50 mg  50 mg Oral Once Kale, Gautam Kishore, MD       famotidine  (PEPCID ) tablet 20 mg  20 mg Oral Once Kale, Gautam Kishore, MD         REVIEW OF SYSTEMS:    10 Point review of Systems was done is negative except as noted above.  PHYSICAL EXAMINATION: ECOG PERFORMANCE STATUS: 1 - Symptomatic but completely ambulatory VSS GENERAL:alert, in no acute distress and comfortable SKIN: no acute rashes, no significant lesions EYES: conjunctiva are pink and non-injected, sclera anicteric LUNGS: clear to auscultation b/l with normal respiratory effort HEART: regular rate & rhythm Extremity: no pedal edema PSYCH: alert & oriented x 3 with fluent speech NEURO: no focal motor/sensory deficits  LABORATORY DATA:  I have reviewed the  data as listed .    Latest Ref Rng & Units 07/10/2024   10:44 AM 06/11/2024    1:13 PM 05/14/2024   11:41 AM  CBC  WBC 4.0 - 10.5 K/uL 3.9  4.3  4.7   Hemoglobin 13.0 - 17.0 g/dL 88.3  88.2  87.7   Hematocrit 39.0 - 52.0 % 34.7  35.2  36.9   Platelets 150 - 400 K/uL 111  99  141    .    Latest Ref Rng & Units 07/10/2024   10:44 AM 06/11/2024    1:13 PM 05/14/2024   11:41 AM  CMP  Glucose 70 - 99 mg/dL 92  95  87   BUN 8 - 23 mg/dL 15  15  15    Creatinine 0.61 - 1.24 mg/dL 8.40  8.41  8.54   Sodium 135 - 145 mmol/L 140  141  143   Potassium 3.5 - 5.1 mmol/L 3.9  3.7  3.6   Chloride 98 - 111 mmol/L 110  110  109   CO2 22 - 32 mmol/L 26  26  28    Calcium  8.9 - 10.3 mg/dL 7.9  8.1  8.5   Total Protein 6.5 - 8.1 g/dL 6.1  6.3  6.6   Total Bilirubin 0.0 - 1.2 mg/dL 0.5  0.6  0.5   Alkaline Phos 38 - 126 U/L 44  43  47   AST 15 - 41 U/L 12  12  13    ALT 0 - 44 U/L 12  11  12       RADIOGRAPHIC STUDIES: I have personally reviewed the radiological images as listed and agreed with the findings in the report. No results found.  ASSESSMENT & PLAN:  Emidio Warrell is a 78 y.o. male who presents to the clinic for continued management for multiple myeloma.   #IgG kappa multiple myeloma:  -See oncologic history as above.  -Current treatment includes Daratumumab /Revlimid /Dexamethasone , started  on 06/13/2023. -Revlimid  held starting 06/19/2023 since patient is unable to take aspirin  therapy due to recent GI bleeding.  -Resumed Revlimid  10 mg on 09/19/2023.   #H/O GI bleeding: --Admitted from 06/05/2023-06/06/2023.EGD showed nonbleeding duodenal ulcers. Received 2 units of PRBC. GI recommended PPI twice daily x 10 weeks and then daily thereafter.  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 --Vision has improved after completion of radiation therapy.  --Monitor for now.   #History of multifocal renal cell carcinoma --Underwent right nephrectomy on 02/03/2016.  He received cryotherapy to his left renal mass in April 2017. --Underwent laparoscopic left partial nephrectomy and simple prostatectomy with Dr. Alvaro on 07/07/2021. 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 Tumor is limited to the kidney (pT1a).   PLAN: -Due for Cycle 15, Day 1 of Dara/Rev/Dex today -Labs from today were reviewed and adequate for treatment. WBC 3.9, Hgb 11.6, Plt 111, Creatinine is stable at 1.59. LFTs are in range.  -Most recent myeloma labs from 06/11/2024 did not detect M protein and Kappa/Lambda light chains are normal.  -Proceed with treatment today without any dose modifications. -Continue Xgeva  q 28 days -Continue Revlimid  10 mg  -Continue aspirin  81 mg p.o. daily for VTE prophylaxis  -Continue with acyclovir  400 mg BID for shingles prophylaxis.    FOLLOW-UP: Per integrated scheduling  All of the patient's questions were answered with apparent  satisfaction. The patient knows to call the clinic with any problems, questions or concerns.  I have spent a total of 30 minutes minutes of face-to-face and non-face-to-face time, preparing to see the patient, performing a  medically appropriate examination, counseling and educating the patient,documenting clinical information in the electronic health record, independently interpreting results and communicating results to the patient, and care coordination.   Johnston Police PA-C Dept of Hematology and Oncology [facility] Phone: [phone]

## 2024-07-11 ENCOUNTER — Other Ambulatory Visit: Payer: Self-pay | Admitting: Hematology

## 2024-07-11 DIAGNOSIS — C9002 Multiple myeloma in relapse: Secondary | ICD-10-CM

## 2024-07-11 LAB — KAPPA/LAMBDA LIGHT CHAINS
Kappa free light chain: 21.8 mg/L — ABNORMAL HIGH (ref 3.3–19.4)
Kappa, lambda light chain ratio: 1.02 (ref 0.26–1.65)
Lambda free light chains: 21.4 mg/L (ref 5.7–26.3)

## 2024-07-12 ENCOUNTER — Other Ambulatory Visit: Payer: Self-pay

## 2024-07-12 ENCOUNTER — Encounter: Payer: Self-pay | Admitting: Hematology

## 2024-07-12 LAB — MULTIPLE MYELOMA PANEL, SERUM
Albumin SerPl Elph-Mcnc: 3.5 g/dL (ref 2.9–4.4)
Albumin/Glob SerPl: 1.6 (ref 0.7–1.7)
Alpha 1: 0.2 g/dL (ref 0.0–0.4)
Alpha2 Glob SerPl Elph-Mcnc: 0.6 g/dL (ref 0.4–1.0)
B-Globulin SerPl Elph-Mcnc: 0.7 g/dL (ref 0.7–1.3)
Gamma Glob SerPl Elph-Mcnc: 0.7 g/dL (ref 0.4–1.8)
Globulin, Total: 2.2 g/dL (ref 2.2–3.9)
IgA: 92 mg/dL (ref 61–437)
IgG (Immunoglobin G), Serum: 812 mg/dL (ref 603–1613)
IgM (Immunoglobulin M), Srm: 7 mg/dL — ABNORMAL LOW (ref 15–143)
Total Protein ELP: 5.7 g/dL — ABNORMAL LOW (ref 6.0–8.5)

## 2024-07-18 ENCOUNTER — Other Ambulatory Visit: Payer: Self-pay

## 2024-08-01 ENCOUNTER — Encounter (HOSPITAL_COMMUNITY): Payer: Self-pay

## 2024-08-01 ENCOUNTER — Ambulatory Visit (HOSPITAL_COMMUNITY)
Admission: EM | Admit: 2024-08-01 | Discharge: 2024-08-01 | Disposition: A | Discharge status: Home / Self Care | Attending: Family Medicine | Admitting: Family Medicine

## 2024-08-01 ENCOUNTER — Ambulatory Visit (INDEPENDENT_AMBULATORY_CARE_PROVIDER_SITE_OTHER)

## 2024-08-01 DIAGNOSIS — R0602 Shortness of breath: Secondary | ICD-10-CM

## 2024-08-01 DIAGNOSIS — J189 Pneumonia, unspecified organism: Secondary | ICD-10-CM | POA: Diagnosis not present

## 2024-08-01 DIAGNOSIS — R918 Other nonspecific abnormal finding of lung field: Secondary | ICD-10-CM | POA: Diagnosis not present

## 2024-08-01 DIAGNOSIS — R059 Cough, unspecified: Secondary | ICD-10-CM | POA: Diagnosis not present

## 2024-08-01 DIAGNOSIS — S2249XD Multiple fractures of ribs, unspecified side, subsequent encounter for fracture with routine healing: Secondary | ICD-10-CM | POA: Diagnosis not present

## 2024-08-01 LAB — POC COVID19/FLU A&B COMBO
Covid Antigen, POC: NEGATIVE
Influenza A Antigen, POC: NEGATIVE
Influenza B Antigen, POC: NEGATIVE

## 2024-08-01 MED ORDER — LEVOFLOXACIN 750 MG PO TABS: 750.0000 mg | ORAL_TABLET | Freq: Every day | ORAL | 0 refills | Status: AC

## 2024-08-01 MED ORDER — BENZONATATE 100 MG PO CAPS: ORAL_CAPSULE | ORAL | 0 refills | Status: AC

## 2024-08-01 NOTE — ED Provider Notes (Signed)
 [facility] CARE CENTER   [id number] 08/01/24 Arrival Time: 0809  ASSESSMENT & PLAN:  1. SOB (shortness of breath)   2. Community acquired pneumonia of left lung, unspecified part of lung    I have personally viewed and independently interpreted the imaging studies ordered this visit. CXR: L-sided haziness, suspicious for PNA.  Begin: Meds ordered this encounter  Medications   levofloxacin  (LEVAQUIN ) 750 MG tablet    Sig: Take 1 tablet (750 mg total) by mouth daily.    Dispense:  7 tablet    Refill:  0   benzonatate  (TESSALON ) 100 MG capsule    Sig: Take 1 capsule by mouth every 8 (eight) hours for cough.    Dispense:  21 capsule    Refill:  0   OTC symptom care as needed.  Recommend:  Follow-up Information     Schedule an appointment as soon as possible for a visit  with Avva, Ravisankar, MD.   Specialty: Internal Medicine Why: For follow up within the next few weeks; may repeat a chest x-ray at that time. Contact information: [address] [place] [address] [phone]         [facility] Emergency Department at [facility].   Specialty: Emergency Medicine Why: If symptoms worsen in any way. Contact information: [address] [place]  [address] [phone]                Reviewed expectations re: course of current medical issues. Questions answered. Outlined signs and symptoms indicating need for more acute intervention. Patient verbalized understanding. After Visit Summary given.  SUBJECTIVE: History from: patient.  Marcus Hatfield is a 78 y.o. male who presents with complaint of URI symptoms; past week; now with worsening cough and feeling SOB at times. Denies fever. Mucinex  without much relief. Normal PO intake without n/v/d. Denies CP.  Social History   Tobacco Use  Smoking Status Never  Smokeless Tobacco Never    OBJECTIVE:  Vitals:   08/01/24 0833  BP: 116/74  Pulse: 63  Resp: 18  Temp: 99  F (37.2 C)  TempSrc: Oral  SpO2: 96%  Weight: 77.6 kg  Height: 5' 10 (1.778 m)     General appearance: alert; NAD HEENT: [place]; AT; without nasal congestion Neck: supple without LAD Lungs: unlabored respirations, moving air well bilaterally; mild dry cough Skin: warm and dry Psychological: alert and cooperative; normal mood and affect  Imaging: DG Chest 2 View Result Date: 08/01/2024 CLINICAL DATA:  cough; SOB; x 1 week EXAM: CHEST - 2 VIEW COMPARISON:  04/24/2024. FINDINGS: There is new heterogeneous opacity overlying the left mid lung zone which appears new since the prior study. In appropriate clinical settings, this may represent pneumonia. However, correlate clinically to determine the need for additional imaging with contrast-enhanced chest CT scan versus follow-up exam in 4-6 weeks to document resolution after the treatment of infection. Bilateral lung fields are otherwise clear. There is subtle blunting of bilateral posterior costophrenic angles, which may represent trace pleural effusion versus pleural thickening. Stable cardio-mediastinal silhouette. No acute osseous abnormalities. Redemonstration of multiple subacute/healing right posterior fourth through seventh rib fractures. The soft tissues are within normal limits. IMPRESSION: *New heterogeneous opacity overlying the left mid lung zone, which may represent pneumonia. However, correlate clinically to determine the need for additional imaging with contrast-enhanced chest CT scan versus follow-up exam in 4-6 weeks to document resolution after the treatment of infection. Electronically Signed   By: Ree Molt M.D.   On:  08/01/2024 09:24    Allergies  Allergen Reactions   Penicillins Hives    Has patient had a PCN reaction causing immediate rash, facial/tongue/throat swelling, SOB or lightheadedness with hypotension:Yes Has patient had a PCN reaction causing severe rash involving mucus membranes or skin necrosis: Yes Has  patient had a PCN reaction that required hospitalization No Has patient had a PCN reaction occurring within the last 10 years: No If all of the above answers are NO, then may proceed with Cephalosporin use.    Latex Rash   Tape Rash    adhesive    Past Medical History:  Diagnosis Date   Anemia    Arthritis    CKD (chronic kidney disease) stage 3, GFR 30-59 ml/min (HCC) 20015   Creat 1.9   Hypertension    Multiple myeloma (HCC) 2016   [facility]   Prostate disorder 02/2017   Family History  Problem Relation Age of Onset   Diabetes Father    Hypertension Brother    Social History   Socioeconomic History   Marital status: Married    Spouse name: Museum/gallery curator   Number of children: 2   Years of education: Not on file   Highest education level: Not on file  Occupational History   Occupation: Retired  Tobacco Use   Smoking status: Never   Smokeless tobacco: Never  Vaping Use   Vaping status: Never Used  Substance and Sexual Activity   Alcohol  use: No   Drug use: No   Sexual activity: Not Currently  Other Topics Concern   Not on file  Social History Narrative   Not on file   Social Drivers of Health   Financial Resource Strain: Not on file  Food Insecurity: Patient Declined (06/05/2023)   Hunger Vital Sign    Worried About Running Out of Food in the Last Year: Patient declined    Ran Out of Food in the Last Year: Patient declined  Transportation Needs: Patient Declined (06/05/2023)   PRAPARE - Administrator, Civil Service (Medical): Patient declined    Lack of Transportation (Non-Medical): Patient declined  Physical Activity: Not on file  Stress: Not on file  Social Connections: Not on file  Intimate Partner Violence: Patient Declined (06/05/2023)   Humiliation, Afraid, Rape, and Kick questionnaire    Fear of Current or Ex-Partner: Patient declined    Emotionally Abused: Patient declined    Physically Abused: Patient declined    Sexually  Abused: Patient declined             Rolinda Rogue, MD 08/01/24 [phone]

## 2024-08-01 NOTE — ED Triage Notes (Signed)
 Chief Complaint: Cough and SOB. States has trouble breathing and chest congestion when laying down. Patient is currently on chemo therapy.   Sick exposure: No  Onset: 1 week   Prescriptions or OTC medications tried: Yes- Mucinex  dm    with little relief  New foods, medications, or products: No  Recent Travel: No

## 2024-08-06 ENCOUNTER — Inpatient Hospital Stay: Admitting: Hematology

## 2024-08-06 ENCOUNTER — Other Ambulatory Visit

## 2024-08-06 ENCOUNTER — Inpatient Hospital Stay

## 2024-08-06 ENCOUNTER — Ambulatory Visit: Admitting: Hematology

## 2024-08-06 ENCOUNTER — Inpatient Hospital Stay: Attending: Hematology

## 2024-08-06 ENCOUNTER — Ambulatory Visit

## 2024-08-06 VITALS — BP 111/72 | HR 57 | Temp 97.3°F | Resp 18 | Wt 164.5 lb

## 2024-08-06 DIAGNOSIS — M129 Arthropathy, unspecified: Secondary | ICD-10-CM | POA: Diagnosis not present

## 2024-08-06 DIAGNOSIS — N183 Chronic kidney disease, stage 3 unspecified: Secondary | ICD-10-CM | POA: Insufficient documentation

## 2024-08-06 DIAGNOSIS — C9002 Multiple myeloma in relapse: Secondary | ICD-10-CM | POA: Diagnosis not present

## 2024-08-06 DIAGNOSIS — Z9079 Acquired absence of other genital organ(s): Secondary | ICD-10-CM | POA: Diagnosis not present

## 2024-08-06 DIAGNOSIS — Z923 Personal history of irradiation: Secondary | ICD-10-CM | POA: Insufficient documentation

## 2024-08-06 DIAGNOSIS — Z8711 Personal history of peptic ulcer disease: Secondary | ICD-10-CM | POA: Insufficient documentation

## 2024-08-06 DIAGNOSIS — D631 Anemia in chronic kidney disease: Secondary | ICD-10-CM | POA: Insufficient documentation

## 2024-08-06 DIAGNOSIS — Z7982 Long term (current) use of aspirin: Secondary | ICD-10-CM | POA: Insufficient documentation

## 2024-08-06 DIAGNOSIS — C9000 Multiple myeloma not having achieved remission: Secondary | ICD-10-CM | POA: Insufficient documentation

## 2024-08-06 DIAGNOSIS — Z7961 Long term (current) use of immunomodulator: Secondary | ICD-10-CM | POA: Insufficient documentation

## 2024-08-06 DIAGNOSIS — Z7189 Other specified counseling: Secondary | ICD-10-CM | POA: Diagnosis not present

## 2024-08-06 DIAGNOSIS — Z85528 Personal history of other malignant neoplasm of kidney: Secondary | ICD-10-CM | POA: Insufficient documentation

## 2024-08-06 DIAGNOSIS — I129 Hypertensive chronic kidney disease with stage 1 through stage 4 chronic kidney disease, or unspecified chronic kidney disease: Secondary | ICD-10-CM | POA: Diagnosis not present

## 2024-08-06 DIAGNOSIS — Z79624 Long term (current) use of inhibitors of nucleotide synthesis: Secondary | ICD-10-CM | POA: Insufficient documentation

## 2024-08-06 DIAGNOSIS — H547 Unspecified visual loss: Secondary | ICD-10-CM | POA: Diagnosis not present

## 2024-08-06 DIAGNOSIS — Z905 Acquired absence of kidney: Secondary | ICD-10-CM | POA: Diagnosis not present

## 2024-08-06 LAB — CBC WITH DIFFERENTIAL (CANCER CENTER ONLY)
Abs Immature Granulocytes: 0.01 K/uL (ref 0.00–0.07)
Basophils Absolute: 0.1 K/uL (ref 0.0–0.1)
Basophils Relative: 3 %
Eosinophils Absolute: 0.4 K/uL (ref 0.0–0.5)
Eosinophils Relative: 11 %
HCT: 34.7 % — ABNORMAL LOW (ref 39.0–52.0)
Hemoglobin: 11.8 g/dL — ABNORMAL LOW (ref 13.0–17.0)
Immature Granulocytes: 0 %
Lymphocytes Relative: 38 %
Lymphs Abs: 1.2 K/uL (ref 0.7–4.0)
MCH: 29.7 pg (ref 26.0–34.0)
MCHC: 34 g/dL (ref 30.0–36.0)
MCV: 87.4 fL (ref 80.0–100.0)
Monocytes Absolute: 0.4 K/uL (ref 0.1–1.0)
Monocytes Relative: 13 %
Neutro Abs: 1.1 K/uL — ABNORMAL LOW (ref 1.7–7.7)
Neutrophils Relative %: 35 %
Platelet Count: 165 K/uL (ref 150–400)
RBC: 3.97 MIL/uL — ABNORMAL LOW (ref 4.22–5.81)
RDW: 15 % (ref 11.5–15.5)
WBC Count: 3.1 K/uL — ABNORMAL LOW (ref 4.0–10.5)
nRBC: 0 % (ref 0.0–0.2)

## 2024-08-06 LAB — COMPREHENSIVE METABOLIC PANEL WITH GFR
ALT: 24 U/L (ref 0–44)
AST: 17 U/L (ref 15–41)
Albumin: 3.8 g/dL (ref 3.5–5.0)
Alkaline Phosphatase: 47 U/L (ref 38–126)
Anion gap: 5 (ref 5–15)
BUN: 14 mg/dL (ref 8–23)
CO2: 27 mmol/L (ref 22–32)
Calcium: 8.7 mg/dL — ABNORMAL LOW (ref 8.9–10.3)
Chloride: 107 mmol/L (ref 98–111)
Creatinine, Ser: 1.67 mg/dL — ABNORMAL HIGH (ref 0.61–1.24)
GFR, Estimated: 42 mL/min — ABNORMAL LOW (ref >60)
Glucose, Bld: 86 mg/dL (ref 70–99)
Potassium: 3.8 mmol/L (ref 3.5–5.1)
Sodium: 139 mmol/L (ref 135–145)
Total Bilirubin: 0.5 mg/dL (ref 0.0–1.2)
Total Protein: 6.4 g/dL — ABNORMAL LOW (ref 6.5–8.1)

## 2024-08-07 ENCOUNTER — Other Ambulatory Visit: Payer: Self-pay | Admitting: Hematology

## 2024-08-07 DIAGNOSIS — C9002 Multiple myeloma in relapse: Secondary | ICD-10-CM

## 2024-08-07 LAB — KAPPA/LAMBDA LIGHT CHAINS
Kappa free light chain: 23.8 mg/L — ABNORMAL HIGH (ref 3.3–19.4)
Kappa, lambda light chain ratio: 0.99 (ref 0.26–1.65)
Lambda free light chains: 24 mg/L (ref 5.7–26.3)

## 2024-08-08 LAB — MULTIPLE MYELOMA PANEL, SERUM
Albumin SerPl Elph-Mcnc: 3 g/dL (ref 2.9–4.4)
Albumin/Glob SerPl: 1.2 (ref 0.7–1.7)
Alpha 1: 0.3 g/dL (ref 0.0–0.4)
Alpha2 Glob SerPl Elph-Mcnc: 0.9 g/dL (ref 0.4–1.0)
B-Globulin SerPl Elph-Mcnc: 0.8 g/dL (ref 0.7–1.3)
Gamma Glob SerPl Elph-Mcnc: 0.8 g/dL (ref 0.4–1.8)
Globulin, Total: 2.7 g/dL (ref 2.2–3.9)
IgA: 105 mg/dL (ref 61–437)
IgG (Immunoglobin G), Serum: 811 mg/dL (ref 603–1613)
IgM (Immunoglobulin M), Srm: 8 mg/dL — ABNORMAL LOW (ref 15–143)
Total Protein ELP: 5.7 g/dL — ABNORMAL LOW (ref 6.0–8.5)

## 2024-08-13 ENCOUNTER — Encounter: Payer: Self-pay | Admitting: Hematology

## 2024-08-13 NOTE — Progress Notes (Signed)
 HEMATOLOGY/ONCOLOGY CLINIC NOTE  Date of Service: .08/06/2024   Patient Care Team: Avva, Ravisankar, MD as PCP - General (Internal Medicine)  CHIEF COMPLAINTS/PURPOSE OF CONSULTATION:  Follow-up for continued evaluation and management of IgG kappa mutliple myeloma with base of skull plasmacytoma  TREATMENT HISTORY 05/23/2016: Induction chemotherapy with bortezomib 1.3 mg/m2 weekly and dexamethasone  40 mg weekly using 28-day cycles. Plans to add lenalidomide  and Zometa  with cycle 2 (06/20/2016).  07/18/2016: Added lenalidomide  10 mg days 1-21 out of 28 days, delayed starting due to insurance. Started lenalidomide  on 07/18/2016 with cycle 3. Achieved a PR after 4 cycles of treatment (2 cycles containing lenalidomide ). 11/14/2016: Treatment changed to carfilzomib  (D1/8/15) and dexamethasone  to achieve a deeper response after having plateaued on prior regimen.  01/24/2017: Bone marrow biopsy showed no increased plasma cells but was positive for Congo red stain. Diagnosis of amyloid was made (AL pending confirmation with mass spect). 02/01/2017: Cyclophosphamide added to existing regimen on D1/8/15 to deepen response and due to amyloid deposits found on bone marrow biopsy. 06/19/2017: Transitioned to D1/15 dosing in an attempt to transition to maintenance. His M-spike was 0.2 g/dL, consistent with a PR but close to VGPR. 10/02/2017: Discontinued dexamethasone  but continued on cyclophosphamide and carfilzomib  after showing persistent stable disease with M-spike 0.29 g/dL. 10/2017: Mr. Damiano held all therapy starting in 10/2017 since he believed it was not needed. A long discussion was had with him during visit in 04/2018 regarding concern for chemical relapse but he refused to get labs drawn and did not show interest in resuming some type of therapy.  07/16/2018 - 06/11/2019: He achieved an unconfirmed remission (VGPR) when re-assessed and maintained no detectable disease by serology since while  on surveillance. 10/07/2019: Routine serology revealed a new M-spike of 0.56 g/dL with IFIX IgG kappa. Patient maintained on surveillance with repeat labs planned for 11/06/2019. 11/06/2019: Confirmed chemical relapse with 2 consecutive positive m-spikes (0.56 and 0.47). A repeat serology from 12/16/19 showed an M-spike of 0.86 and restaging was was performed with bone marrow biopsy and PETCT scan in 12/2019, which showed 3% kappa restricted plasma cells and no evidence of new/active lesions. Patient remained on active surveillance per his request.  05/20/2023: Underwent CT head due to vision disturbance to the left eye x 3 weeks. Findings included large mass at the skull base, eroding and expanding the clivus,also involving the sphenoid bone and the petrous portions of both temporal bones. The mass broadly abuts both optic nerves between the chiasm and orbital apices. Mass also broadly involves both cavernous sinuses. 05/20/2023: MRI brain: 7.8 x 5.6 x 3.3 cm enhancing mass lesion at the skull base extending along the petrous ridge bilaterally, greater on the right. 05/21/2023: CT CAP: Numerous lytic osseous lesions, consistent with patient's history of multiple myeloma. A large lucent lesion of the T6 vertebral body involves the posterior cortex, extension into the spinal canal can not be excluded 05/22/2023-06/02/2023: Received palliative radiation to base of skull and medial right clavicle. 06/13/2023: Started Cycle 1, Day 1 of Daratumumab /Revlimid /Dex.  06/19/2023: Recommend to hold Revlimid  as patient cannot take aspirin  therapy due to recent GI bleed. 09/19/2023: Resumed Revlimid  10 mg.    INTERVAL HISTORY:  Marcus Hatfield is a 78 y.o. male who is here for continued evaluation and management of multiple myeloma with base of skull plasmacytoma.  Patient notes no acute new symptoms since his last clinic visit. With no chills no night sweats no unexpected weight loss.  No new vision changes.  No  new  headaches.  No new focal bone pains.. Patient notes no new toxicities from his current treatment regimen.SABRA   MEDICAL HISTORY:  Past Medical History:  Diagnosis Date   Anemia    Arthritis    CKD (chronic kidney disease) stage 3, GFR 30-59 ml/min (HCC) 20015   Creat 1.9   Hypertension    Multiple myeloma (HCC) 2016   [facility] heme onc   Prostate disorder 02/2017    SURGICAL HISTORY: Past Surgical History:  Procedure Laterality Date   BIOPSY  06/06/2023   Procedure: BIOPSY;  Surgeon: Federico Rosario BROCKS, MD;  Location: [facility] ENDOSCOPY;  Service: Gastroenterology;;   ESOPHAGOGASTRODUODENOSCOPY (EGD) WITH PROPOFOL  N/A 06/06/2023   Procedure: ESOPHAGOGASTRODUODENOSCOPY (EGD) WITH PROPOFOL ;  Surgeon: Federico Rosario BROCKS, MD;  Location: [facility] ENDOSCOPY;  Service: Gastroenterology;  Laterality: N/A;   EYE SURGERY Bilateral 01/30/2018   KNEE SURGERY     over ten years ago   NEPHRECTOMY Right 03/2015   ROBOTIC ASSITED PARTIAL NEPHRECTOMY Left 07/07/2021   Procedure: XI ROBOTIC ASSITED PARTIAL NEPHRECTOMY;  Surgeon: Alvaro Hummer, MD;  Location: [facility] ORS;  Service: Urology;  Laterality: Left;  5 HRS   XI ROBOTIC ASSISTED SIMPLE PROSTATECTOMY N/A 07/07/2021   Procedure: XI ROBOTIC ASSISTED SIMPLE PROSTATECTOMY;  Surgeon: Alvaro Hummer, MD;  Location: [facility] ORS;  Service: Urology;  Laterality: N/A;    SOCIAL HISTORY: Social History   Socioeconomic History   Marital status: Married    Spouse name: Museum/gallery curator   Number of children: 2   Years of education: Not on file   Highest education level: Not on file  Occupational History   Occupation: Retired  Tobacco Use   Smoking status: Never   Smokeless tobacco: Never  Vaping Use   Vaping status: Never Used  Substance and Sexual Activity   Alcohol  use: No   Drug use: No   Sexual activity: Not Currently  Other Topics Concern   Not on file  Social History Narrative   Not on file   Social Drivers of Health   Financial Resource Strain: Not on file   Food Insecurity: Patient Declined (06/05/2023)   Hunger Vital Sign    Worried About Running Out of Food in the Last Year: Patient declined    Ran Out of Food in the Last Year: Patient declined  Transportation Needs: Patient Declined (06/05/2023)   PRAPARE - Administrator, Civil Service (Medical): Patient declined    Lack of Transportation (Non-Medical): Patient declined  Physical Activity: Not on file  Stress: Not on file  Social Connections: Not on file  Intimate Partner Violence: Patient Declined (06/05/2023)   Humiliation, Afraid, Rape, and Kick questionnaire    Fear of Current or Ex-Partner: Patient declined    Emotionally Abused: Patient declined    Physically Abused: Patient declined    Sexually Abused: Patient declined    FAMILY HISTORY: Family History  Problem Relation Age of Onset   Diabetes Father    Hypertension Brother     ALLERGIES:  is allergic to penicillins, latex, and tape.  MEDICATIONS:  Current Outpatient Medications  Medication Sig Dispense Refill   acyclovir  (ZOVIRAX ) 400 MG tablet TAKE 1 TABLET BY MOUTH TWICE A DAY 180 tablet 3   amLODipine  (NORVASC ) 10 MG tablet Take 0.5 tablets (5 mg total) by mouth daily.     aspirin  81 MG chewable tablet Chew 81 mg by mouth daily.     benzonatate  (TESSALON ) 100 MG capsule Take 1 capsule by mouth every 8 (eight)  hours for cough. 21 capsule 0   carvedilol  (COREG ) 6.25 MG tablet Take 1 tablet (6.25 mg total) by mouth 2 (two) times daily with a meal.     diclofenac Sodium (VOLTAREN) 1 % GEL Apply 2 g topically daily as needed (for pain).     docusate sodium  (COLACE) 100 MG capsule Take 1 capsule (100 mg total) by mouth daily as needed for mild constipation.     finasteride  (PROSCAR ) 5 MG tablet Take 5 mg by mouth daily.     fluticasone  (FLONASE ) 50 MCG/ACT nasal spray Place 2 sprays into both nostrils daily as needed for allergies.     furosemide  (LASIX ) 20 MG tablet Take 0.5 tablets (10 mg total) by mouth  every other day. 30 tablet    lactulose  (CHRONULAC ) 10 GM/15ML solution Take 10 g by mouth daily as needed for constipation.  5   levofloxacin  (LEVAQUIN ) 750 MG tablet Take 1 tablet (750 mg total) by mouth daily. 7 tablet 0   ondansetron  (ZOFRAN ) 8 MG tablet Take 1 tablet (8 mg total) by mouth every 8 (eight) hours as needed for nausea or vomiting. 30 tablet 1   oxyCODONE -acetaminophen  (PERCOCET/ROXICET) 5-325 MG tablet Take 0.5-1 tablets by mouth every 6 (six) hours as needed for severe pain (pain score 7-10). 20 tablet 0   prochlorperazine  (COMPAZINE ) 10 MG tablet Take 1 tablet (10 mg total) by mouth every 6 (six) hours as needed for nausea or vomiting. 30 tablet 1   tamsulosin  (FLOMAX ) 0.4 MG CAPS capsule Take 1 capsule (0.4 mg total) by mouth daily after supper. 30 capsule 1   telmisartan (MICARDIS) 80 MG tablet Take 80 mg by mouth daily.     ferrous sulfate  325 (65 FE) MG tablet Take 1 tablet (325 mg total) by mouth 2 (two) times daily as needed (iron).     lenalidomide  (REVLIMID ) 10 MG capsule TAKE 1 CAPSULE BY MOUTH 1 TIME A DAY FOR 21 DAYS ON THEN 7 DAYS OFF 21 capsule 0   pantoprazole  (PROTONIX ) 40 MG tablet Take 1 tablet (40 mg total) by mouth 2 (two) times daily. Take bid x 10 weeks, followed by daily thereafter 90 tablet 3   No current facility-administered medications for this visit.    REVIEW OF SYSTEMS:   .10 Point review of Systems was done is negative except as noted above.   PHYSICAL EXAMINATION: .BP 111/72   Pulse (!) 57   Temp (!) 97.3 F (36.3 C)   Resp 18   Wt 164 lb 8 oz (74.6 kg)   SpO2 100%   BMI 23.60 kg/m  . GENERAL:alert, in no acute distress and comfortable SKIN: no acute rashes, no significant lesions EYES: conjunctiva are pink and non-injected, sclera anicteric OROPHARYNX: MMM, no exudates, no oropharyngeal erythema or ulceration NECK: supple, no JVD LYMPH:  no palpable lymphadenopathy in the cervical, axillary or inguinal regions LUNGS: clear to  auscultation b/l with normal respiratory effort HEART: regular rate & rhythm ABDOMEN:  normoactive bowel sounds , non tender, not distended. Extremity: no pedal edema PSYCH: alert & oriented x 3 with fluent speech NEURO: no focal motor/sensory deficits  LABORATORY DATA:  I have reviewed the data as listed .    Latest Ref Rng & Units 08/06/2024    9:20 AM 07/10/2024   10:44 AM 06/11/2024    1:13 PM  CBC  WBC 4.0 - 10.5 K/uL 3.1  3.9  4.3   Hemoglobin 13.0 - 17.0 g/dL 88.1  88.3  88.2  Hematocrit 39.0 - 52.0 % 34.7  34.7  35.2   Platelets 150 - 400 K/uL 165  111  99    .    Latest Ref Rng & Units 08/06/2024    9:20 AM 07/10/2024   10:44 AM 06/11/2024    1:13 PM  CMP  Glucose 70 - 99 mg/dL 86  92  95   BUN 8 - 23 mg/dL 14  15  15    Creatinine 0.61 - 1.24 mg/dL 8.32  8.40  8.41   Sodium 135 - 145 mmol/L 139  140  141   Potassium 3.5 - 5.1 mmol/L 3.8  3.9  3.7   Chloride 98 - 111 mmol/L 107  110  110   CO2 22 - 32 mmol/L 27  26  26    Calcium  8.9 - 10.3 mg/dL 8.7  7.9  8.1   Total Protein 6.5 - 8.1 g/dL 6.4  6.1  6.3   Total Bilirubin 0.0 - 1.2 mg/dL 0.5  0.5  0.6   Alkaline Phos 38 - 126 U/L 47  44  43   AST 15 - 41 U/L 17  12  12    ALT 0 - 44 U/L 24  12  11       RADIOGRAPHIC STUDIES: I have personally reviewed the radiological images as listed and agreed with the findings in the report. DG Chest 2 View Result Date: 08/01/2024 CLINICAL DATA:  cough; SOB; x 1 week EXAM: CHEST - 2 VIEW COMPARISON:  04/24/2024. FINDINGS: There is new heterogeneous opacity overlying the left mid lung zone which appears new since the prior study. In appropriate clinical settings, this may represent pneumonia. However, correlate clinically to determine the need for additional imaging with contrast-enhanced chest CT scan versus follow-up exam in 4-6 weeks to document resolution after the treatment of infection. Bilateral lung fields are otherwise clear. There is subtle blunting of bilateral posterior  costophrenic angles, which may represent trace pleural effusion versus pleural thickening. Stable cardio-mediastinal silhouette. No acute osseous abnormalities. Redemonstration of multiple subacute/healing right posterior fourth through seventh rib fractures. The soft tissues are within normal limits. IMPRESSION: *New heterogeneous opacity overlying the left mid lung zone, which may represent pneumonia. However, correlate clinically to determine the need for additional imaging with contrast-enhanced chest CT scan versus follow-up exam in 4-6 weeks to document resolution after the treatment of infection. Electronically Signed   By: Ree Molt M.D.   On: 08/01/2024 09:24    ASSESSMENT & PLAN:  Marcus Hatfield is a 78 y.o. male who presents to the clinic for continued management for multiple myeloma.   #IgG kappa multiple myeloma:  -See oncologic history as above.  -Current treatment includes maintenance therapy with daratumumab /Revlimid /Dexamethasone  -Revlimid  held starting 06/19/2023 since patient is unable to take aspirin  therapy due to recent GI bleeding.  -Resumed Revlimid  10 mg on 09/19/2023.   #H/O GI bleeding: --Admitted from 06/05/2023-06/06/2023.EGD showed nonbleeding duodenal ulcers. Received 2 units of PRBC. GI recommended PPI twice daily x 10 weeks and then daily thereafter.  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 --Vision has improved after completion of radiation therapy.  --Monitor for now.   #History of multifocal renal cell carcinoma --Underwent right nephrectomy on 02/03/2016.  He received cryotherapy to his left renal mass in April 2017. --Underwent laparoscopic left partial nephrectomy and simple prostatectomy with Dr. Alvaro on 07/07/2021. Path revealed  papillary renal cell carcinoma, type I, nuclear grade 2  with infarction and chronic inflammation. Tumor is limited to the kidney (pT1a).   PLAN: -Labs done today were reviewed in detail with the patient CBC shows hemoglobin of 11.8, WBC count of 3.1k platelets of 165k CMP stable with chronic kidney disease creatinine of 1.67 Myeloma panel shows no detectable M spike Serum kappa lambda light chain ratio within normal limits Patient notes no significant toxicities from his current treatment regimen -Will continue maintenance treatment with Dara Faspro/Revlimid /dexamethasone  -Continue Xgeva  every 28 days -Continue Revlimid  10 mg 3 weeks on 1 week off -Continue aspirin  81 mg p.o. daily for VTE prophylaxis  -Continue with acyclovir  400 mg BID for shingles prophylaxis.    FOLLOW-UP: Per integrated scheduling  .The total time spent in the appointment was 30 minutes* .  All of the patient's questions were answered with apparent satisfaction. The patient knows to call the clinic with any problems, questions or concerns.   Emaline Saran MD MS AAHIVMS [facility] Hematology/Oncology Physician [facility]  .*Total Encounter Time as defined by the Centers for Medicare and Medicaid Services includes, in addition to the face-to-face time of a patient visit (documented in the note above) non-face-to-face time: obtaining and reviewing outside history, ordering and reviewing medications, tests or procedures, care coordination (communications with other health care professionals or caregivers) and documentation in the medical record.

## 2024-08-28 ENCOUNTER — Other Ambulatory Visit: Payer: Self-pay

## 2024-09-04 ENCOUNTER — Inpatient Hospital Stay

## 2024-09-04 ENCOUNTER — Encounter: Payer: Self-pay | Admitting: Hematology

## 2024-09-04 ENCOUNTER — Inpatient Hospital Stay: Attending: Hematology

## 2024-09-04 ENCOUNTER — Inpatient Hospital Stay (HOSPITAL_BASED_OUTPATIENT_CLINIC_OR_DEPARTMENT_OTHER): Admitting: Physician Assistant

## 2024-09-04 VITALS — BP 118/76 | HR 55 | Temp 97.7°F | Resp 17 | Ht 70.0 in | Wt 168.0 lb

## 2024-09-04 DIAGNOSIS — Z7189 Other specified counseling: Secondary | ICD-10-CM

## 2024-09-04 DIAGNOSIS — G8929 Other chronic pain: Secondary | ICD-10-CM | POA: Insufficient documentation

## 2024-09-04 DIAGNOSIS — I129 Hypertensive chronic kidney disease with stage 1 through stage 4 chronic kidney disease, or unspecified chronic kidney disease: Secondary | ICD-10-CM | POA: Insufficient documentation

## 2024-09-04 DIAGNOSIS — Z7961 Long term (current) use of immunomodulator: Secondary | ICD-10-CM | POA: Insufficient documentation

## 2024-09-04 DIAGNOSIS — Z8711 Personal history of peptic ulcer disease: Secondary | ICD-10-CM | POA: Diagnosis not present

## 2024-09-04 DIAGNOSIS — C9002 Multiple myeloma in relapse: Secondary | ICD-10-CM | POA: Diagnosis not present

## 2024-09-04 DIAGNOSIS — Z79624 Long term (current) use of inhibitors of nucleotide synthesis: Secondary | ICD-10-CM | POA: Diagnosis not present

## 2024-09-04 DIAGNOSIS — K5900 Constipation, unspecified: Secondary | ICD-10-CM | POA: Diagnosis not present

## 2024-09-04 DIAGNOSIS — Z5112 Encounter for antineoplastic immunotherapy: Secondary | ICD-10-CM | POA: Diagnosis present

## 2024-09-04 DIAGNOSIS — H547 Unspecified visual loss: Secondary | ICD-10-CM | POA: Insufficient documentation

## 2024-09-04 DIAGNOSIS — C9000 Multiple myeloma not having achieved remission: Secondary | ICD-10-CM | POA: Diagnosis not present

## 2024-09-04 DIAGNOSIS — N183 Chronic kidney disease, stage 3 unspecified: Secondary | ICD-10-CM | POA: Insufficient documentation

## 2024-09-04 DIAGNOSIS — M549 Dorsalgia, unspecified: Secondary | ICD-10-CM | POA: Insufficient documentation

## 2024-09-04 DIAGNOSIS — Z7982 Long term (current) use of aspirin: Secondary | ICD-10-CM | POA: Diagnosis not present

## 2024-09-04 LAB — CBC WITH DIFFERENTIAL (CANCER CENTER ONLY)
Abs Immature Granulocytes: 0.01 K/uL (ref 0.00–0.07)
Basophils Absolute: 0 K/uL (ref 0.0–0.1)
Basophils Relative: 1 %
Eosinophils Absolute: 0.3 K/uL (ref 0.0–0.5)
Eosinophils Relative: 8 %
HCT: 36.6 % — ABNORMAL LOW (ref 39.0–52.0)
Hemoglobin: 12.1 g/dL — ABNORMAL LOW (ref 13.0–17.0)
Immature Granulocytes: 0 %
Lymphocytes Relative: 45 %
Lymphs Abs: 1.7 K/uL (ref 0.7–4.0)
MCH: 30 pg (ref 26.0–34.0)
MCHC: 33.1 g/dL (ref 30.0–36.0)
MCV: 90.8 fL (ref 80.0–100.0)
Monocytes Absolute: 0.4 K/uL (ref 0.1–1.0)
Monocytes Relative: 11 %
Neutro Abs: 1.3 K/uL — ABNORMAL LOW (ref 1.7–7.7)
Neutrophils Relative %: 35 %
Platelet Count: 124 K/uL — ABNORMAL LOW (ref 150–400)
RBC: 4.03 MIL/uL — ABNORMAL LOW (ref 4.22–5.81)
RDW: 16.4 % — ABNORMAL HIGH (ref 11.5–15.5)
WBC Count: 3.7 K/uL — ABNORMAL LOW (ref 4.0–10.5)
nRBC: 0 % (ref 0.0–0.2)

## 2024-09-04 LAB — COMPREHENSIVE METABOLIC PANEL WITH GFR
ALT: 11 U/L (ref 0–44)
AST: 11 U/L — ABNORMAL LOW (ref 15–41)
Albumin: 4 g/dL (ref 3.5–5.0)
Alkaline Phosphatase: 50 U/L (ref 38–126)
Anion gap: 4 — ABNORMAL LOW (ref 5–15)
BUN: 19 mg/dL (ref 8–23)
CO2: 31 mmol/L (ref 22–32)
Calcium: 8.8 mg/dL — ABNORMAL LOW (ref 8.9–10.3)
Chloride: 108 mmol/L (ref 98–111)
Creatinine, Ser: 1.78 mg/dL — ABNORMAL HIGH (ref 0.61–1.24)
GFR, Estimated: 39 mL/min — ABNORMAL LOW (ref >60)
Glucose, Bld: 54 mg/dL — ABNORMAL LOW (ref 70–99)
Potassium: 4.3 mmol/L (ref 3.5–5.1)
Sodium: 143 mmol/L (ref 135–145)
Total Bilirubin: 0.5 mg/dL (ref 0.0–1.2)
Total Protein: 6.5 g/dL (ref 6.5–8.1)

## 2024-09-04 MED ORDER — DARATUMUMAB-HYALURONIDASE-FIHJ 1800-30000 MG-UT/15ML ~~LOC~~ SOLN
1800.0000 mg | Freq: Once | SUBCUTANEOUS | Status: AC
  Administered 2024-09-04: 1800 mg via SUBCUTANEOUS
  Filled 2024-09-04: qty 15

## 2024-09-04 MED ORDER — DIPHENHYDRAMINE HCL 25 MG PO CAPS
50.0000 mg | ORAL_CAPSULE | Freq: Once | ORAL | Status: AC
  Administered 2024-09-04: 50 mg via ORAL
  Filled 2024-09-04: qty 2

## 2024-09-04 MED ORDER — ACETAMINOPHEN 325 MG PO TABS
650.0000 mg | ORAL_TABLET | Freq: Once | ORAL | Status: AC
  Administered 2024-09-04: 650 mg via ORAL
  Filled 2024-09-04: qty 2

## 2024-09-04 MED ORDER — DEXAMETHASONE 6 MG PO TABS
12.0000 mg | ORAL_TABLET | Freq: Once | ORAL | Status: AC
  Administered 2024-09-04: 12 mg via ORAL
  Filled 2024-09-04: qty 2

## 2024-09-04 MED ORDER — DENOSUMAB 120 MG/1.7ML ~~LOC~~ SOLN
120.0000 mg | Freq: Once | SUBCUTANEOUS | Status: AC
  Administered 2024-09-04: 120 mg via SUBCUTANEOUS
  Filled 2024-09-04: qty 1.7

## 2024-09-04 MED ORDER — FAMOTIDINE 20 MG PO TABS
20.0000 mg | ORAL_TABLET | Freq: Once | ORAL | Status: AC
  Administered 2024-09-04: 20 mg via ORAL
  Filled 2024-09-04: qty 1

## 2024-09-04 NOTE — Progress Notes (Signed)
 HEMATOLOGY/ONCOLOGY CLINIC NOTE  Date of Service: .09/04/2024   Patient Care Team: [name], MD as PCP - General (Internal Medicine)  CHIEF COMPLAINTS/PURPOSE OF CONSULTATION:  Follow-up for continued evaluation and management of IgG kappa mutliple myeloma with base of skull plasmacytoma  TREATMENT HISTORY 05/23/2016: Induction chemotherapy with bortezomib 1.3 mg/m2 weekly and dexamethasone  40 mg weekly using 28-day cycles. Plans to add lenalidomide  and Zometa  with cycle 2 (06/20/2016).  07/18/2016: Added lenalidomide  10 mg days 1-21 out of 28 days, delayed starting due to insurance. Started lenalidomide  on 07/18/2016 with cycle 3. Achieved a PR after 4 cycles of treatment (2 cycles containing lenalidomide ). 11/14/2016: Treatment changed to carfilzomib  (D1/8/15) and dexamethasone  to achieve a deeper response after having plateaued on prior regimen.  01/24/2017: Bone marrow biopsy showed no increased plasma cells but was positive for Congo red stain. Diagnosis of amyloid was made (AL pending confirmation with mass spect). 02/01/2017: Cyclophosphamide added to existing regimen on D1/8/15 to deepen response and due to amyloid deposits found on bone marrow biopsy. 06/19/2017: Transitioned to D1/15 dosing in an attempt to transition to maintenance. His M-spike was 0.2 g/dL, consistent with a PR but close to VGPR. 10/02/2017: Discontinued dexamethasone  but continued on cyclophosphamide and carfilzomib  after showing persistent stable disease with M-spike 0.29 g/dL. 10/2017: Marcus Hatfield held all therapy starting in 10/2017 since he believed it was not needed. A long discussion was had with him during visit in 04/2018 regarding concern for chemical relapse but he refused to get labs drawn and did not show interest in resuming some type of therapy.  07/16/2018 - 06/11/2019: He achieved an unconfirmed remission (VGPR) when re-assessed and maintained no detectable disease by serology since while  on surveillance. 10/07/2019: Routine serology revealed a new M-spike of 0.56 g/dL with IFIX IgG kappa. Patient maintained on surveillance with repeat labs planned for 11/06/2019. 11/06/2019: Confirmed chemical relapse with 2 consecutive positive m-spikes (0.56 and 0.47). A repeat serology from 12/16/19 showed an M-spike of 0.86 and restaging was was performed with bone marrow biopsy and PETCT scan in 12/2019, which showed 3% kappa restricted plasma cells and no evidence of new/active lesions. Patient remained on active surveillance per his request.  05/20/2023: Underwent CT head due to vision disturbance to the left eye x 3 weeks. Findings included large mass at the skull base, eroding and expanding the clivus,also involving the sphenoid bone and the petrous portions of both temporal bones. The mass broadly abuts both optic nerves between the chiasm and orbital apices. Mass also broadly involves both cavernous sinuses. 05/20/2023: MRI brain: 7.8 x 5.6 x 3.3 cm enhancing mass lesion at the skull base extending along the petrous ridge bilaterally, greater on the right. 05/21/2023: CT CAP: Numerous lytic osseous lesions, consistent with patient's history of multiple myeloma. A large lucent lesion of the T6 vertebral body involves the posterior cortex, extension into the spinal canal can not be excluded 05/22/2023-06/02/2023: Received palliative radiation to base of skull and medial right clavicle. 06/13/2023: Started Cycle 1, Day 1 of Daratumumab /Revlimid /Dex.  06/19/2023: Recommend to hold Revlimid  as patient cannot take aspirin  therapy due to recent GI bleed. 09/19/2023: Resumed Revlimid  10 mg.    INTERVAL HISTORY:  Marcus Hatfield is a 78 y.o. male who is here for continued evaluation and management of multiple myeloma with base of skull plasmacytoma.   Marcus Hatfield reports that his energy and appetite are unchanged. His weight has been overall stable over the last several weeks. He denies nausea, vomiting or  abdominal pain. He has occasional episodes of constipation that resolves with stool softeners/laxatives as needed.He denies easy bruising or signs of bleeding. He has chronic back pain that is usually in the morning and improves with movement. He does not require any pain medication for his back pain. He denies fevers, chills, sweats, shortness of breath, chest pain or cough. He has no other complaints. Rest of the ROS is below.    MEDICAL HISTORY:  Past Medical History:  Diagnosis Date   Anemia    Arthritis    CKD (chronic kidney disease) stage 3, GFR 30-59 ml/min (HCC) 20015   Creat 1.9   Hypertension    Multiple myeloma (HCC) 2016   [facility]   Prostate disorder 02/2017    SURGICAL HISTORY: Past Surgical History:  Procedure Laterality Date   BIOPSY  06/06/2023   Procedure: BIOPSY;  Surgeon: Federico Rosario BROCKS, MD;  Location: [facility] ENDOSCOPY;  Service: Gastroenterology;;   ESOPHAGOGASTRODUODENOSCOPY (EGD) WITH PROPOFOL  N/A 06/06/2023   Procedure: ESOPHAGOGASTRODUODENOSCOPY (EGD) WITH PROPOFOL ;  Surgeon: Federico Rosario BROCKS, MD;  Location: [facility] ENDOSCOPY;  Service: Gastroenterology;  Laterality: N/A;   EYE SURGERY Bilateral 01/30/2018   KNEE SURGERY     over ten years ago   NEPHRECTOMY Right 03/2015   ROBOTIC ASSITED PARTIAL NEPHRECTOMY Left 07/07/2021   Procedure: XI ROBOTIC ASSITED PARTIAL NEPHRECTOMY;  Surgeon: Alvaro Hummer, MD;  Location: [facility] ORS;  Service: Urology;  Laterality: Left;  5 HRS   XI ROBOTIC ASSISTED SIMPLE PROSTATECTOMY N/A 07/07/2021   Procedure: XI ROBOTIC ASSISTED SIMPLE PROSTATECTOMY;  Surgeon: Alvaro Hummer, MD;  Location: [facility] ORS;  Service: Urology;  Laterality: N/A;    SOCIAL HISTORY: Social History   Socioeconomic History   Marital status: Married    Spouse name: Museum/gallery curator   Number of children: 2   Years of education: Not on file   Highest education level: Not on file  Occupational History   Occupation: Retired  Tobacco Use   Smoking status: Never    Smokeless tobacco: Never  Vaping Use   Vaping status: Never Used  Substance and Sexual Activity   Alcohol  use: No   Drug use: No   Sexual activity: Not Currently  Other Topics Concern   Not on file  Social History Narrative   Not on file   Social Drivers of Health   Financial Resource Strain: Not on file  Food Insecurity: Patient Declined (06/05/2023)   Hunger Vital Sign    Worried About Running Out of Food in the Last Year: Patient declined    Ran Out of Food in the Last Year: Patient declined  Transportation Needs: Patient Declined (06/05/2023)   PRAPARE - Administrator, Civil Service (Medical): Patient declined    Lack of Transportation (Non-Medical): Patient declined  Physical Activity: Not on file  Stress: Not on file  Social Connections: Not on file  Intimate Partner Violence: Patient Declined (06/05/2023)   Humiliation, Afraid, Rape, and Kick questionnaire    Fear of Current or Ex-Partner: Patient declined    Emotionally Abused: Patient declined    Physically Abused: Patient declined    Sexually Abused: Patient declined    FAMILY HISTORY: Family History  Problem Relation Age of Onset   Diabetes Father    Hypertension Brother     ALLERGIES:  is allergic to penicillins, latex, and tape.  MEDICATIONS:  Current Outpatient Medications  Medication Sig Dispense Refill   acyclovir  (ZOVIRAX ) 400 MG tablet TAKE 1 TABLET BY MOUTH TWICE  A DAY 180 tablet 3   amLODipine  (NORVASC ) 10 MG tablet Take 0.5 tablets (5 mg total) by mouth daily.     aspirin  81 MG chewable tablet Chew 81 mg by mouth daily.     benzonatate  (TESSALON ) 100 MG capsule Take 1 capsule by mouth every 8 (eight) hours for cough. 21 capsule 0   carvedilol  (COREG ) 6.25 MG tablet Take 1 tablet (6.25 mg total) by mouth 2 (two) times daily with a meal.     diclofenac Sodium (VOLTAREN) 1 % GEL Apply 2 g topically daily as needed (for pain).     docusate sodium  (COLACE) 100 MG capsule Take 1 capsule  (100 mg total) by mouth daily as needed for mild constipation.     ferrous sulfate  325 (65 FE) MG tablet Take 1 tablet (325 mg total) by mouth 2 (two) times daily as needed (iron).     finasteride  (PROSCAR ) 5 MG tablet Take 5 mg by mouth daily.     fluticasone  (FLONASE ) 50 MCG/ACT nasal spray Place 2 sprays into both nostrils daily as needed for allergies.     furosemide  (LASIX ) 20 MG tablet Take 0.5 tablets (10 mg total) by mouth every other day. 30 tablet    lactulose  (CHRONULAC ) 10 GM/15ML solution Take 10 g by mouth daily as needed for constipation.  5   lenalidomide  (REVLIMID ) 10 MG capsule TAKE 1 CAPSULE BY MOUTH 1 TIME A DAY FOR 21 DAYS ON THEN 7 DAYS OFF 21 capsule 0   levofloxacin  (LEVAQUIN ) 750 MG tablet Take 1 tablet (750 mg total) by mouth daily. 7 tablet 0   ondansetron  (ZOFRAN ) 8 MG tablet Take 1 tablet (8 mg total) by mouth every 8 (eight) hours as needed for nausea or vomiting. 30 tablet 1   oxyCODONE -acetaminophen  (PERCOCET/ROXICET) 5-325 MG tablet Take 0.5-1 tablets by mouth every 6 (six) hours as needed for severe pain (pain score 7-10). 20 tablet 0   prochlorperazine  (COMPAZINE ) 10 MG tablet Take 1 tablet (10 mg total) by mouth every 6 (six) hours as needed for nausea or vomiting. 30 tablet 1   tamsulosin  (FLOMAX ) 0.4 MG CAPS capsule Take 1 capsule (0.4 mg total) by mouth daily after supper. 30 capsule 1   telmisartan (MICARDIS) 80 MG tablet Take 80 mg by mouth daily.     pantoprazole  (PROTONIX ) 40 MG tablet Take 1 tablet (40 mg total) by mouth 2 (two) times daily. Take bid x 10 weeks, followed by daily thereafter 90 tablet 3   No current facility-administered medications for this visit.    REVIEW OF SYSTEMS:   .10 Point review of Systems was done is negative except as noted above.   PHYSICAL EXAMINATION: .BP 118/76 (BP Location: Right Arm, Patient Position: Sitting)   Pulse (!) 55   Temp 97.7 F (36.5 C) (Temporal)   Resp 17   Ht 5' 10 (1.778 m)   Wt 168 lb (76.2  kg)   SpO2 100%   BMI 24.11 kg/m  . GENERAL:alert, in no acute distress and comfortable SKIN: no acute rashes, no significant lesions EYES: conjunctiva are pink and non-injected, sclera anicteric LUNGS: clear to auscultation b/l with normal respiratory effort HEART: regular rate & rhythm Extremity: no pedal edema PSYCH: alert & oriented x 3 with fluent speech NEURO: no focal motor/sensory deficits  LABORATORY DATA:  I have reviewed the data as listed .    Latest Ref Rng & Units 09/04/2024   10:16 AM 08/06/2024    9:20 AM 07/10/2024  10:44 AM  CBC  WBC 4.0 - 10.5 K/uL 3.7  3.1  3.9   Hemoglobin 13.0 - 17.0 g/dL 87.8  88.1  88.3   Hematocrit 39.0 - 52.0 % 36.6  34.7  34.7   Platelets 150 - 400 K/uL 124  165  111    .    Latest Ref Rng & Units 09/04/2024   10:16 AM 08/06/2024    9:20 AM 07/10/2024   10:44 AM  CMP  Glucose 70 - 99 mg/dL 54  86  92   BUN 8 - 23 mg/dL 19  14  15    Creatinine 0.61 - 1.24 mg/dL 8.21  8.32  8.40   Sodium 135 - 145 mmol/L 143  139  140   Potassium 3.5 - 5.1 mmol/L 4.3  3.8  3.9   Chloride 98 - 111 mmol/L 108  107  110   CO2 22 - 32 mmol/L 31  27  26    Calcium  8.9 - 10.3 mg/dL 8.8  8.7  7.9   Total Protein 6.5 - 8.1 g/dL 6.5  6.4  6.1   Total Bilirubin 0.0 - 1.2 mg/dL 0.5  0.5  0.5   Alkaline Phos 38 - 126 U/L 50  47  44   AST 15 - 41 U/L 11  17  12    ALT 0 - 44 U/L 11  24  12       RADIOGRAPHIC STUDIES: I have personally reviewed the radiological images as listed and agreed with the findings in the report. No results found.   ASSESSMENT & PLAN:  Marcus Hatfield is a 78 y.o. male who presents to the clinic for continued management for multiple myeloma.   #IgG kappa multiple myeloma:  -See oncologic history as above.  -Current treatment includes maintenance therapy with daratumumab /Revlimid /Dexamethasone  -Revlimid  held starting 06/19/2023 since patient is unable to take aspirin  therapy due to recent GI bleeding.  -Resumed Revlimid  10 mg on  09/19/2023.   #H/O GI bleeding: --Admitted from 06/05/2023-06/06/2023.EGD showed nonbleeding duodenal ulcers. Received 2 units of PRBC. GI recommended PPI twice daily x 10 weeks and then daily thereafter.  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 --Vision has improved after completion of radiation therapy.  --Monitor for now.   #History of multifocal renal cell carcinoma --Underwent right nephrectomy on 02/03/2016.  He received cryotherapy to his left renal mass in April 2017. --Underwent laparoscopic left partial nephrectomy and simple prostatectomy with Dr. Alvaro on 07/07/2021. 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 Tumor is limited to the kidney (pT1a).   PLAN: -Labs from today were reviewed and adequate for treatment. WBC 3.7, Hgb 12.1, MCV 90.8, Plt 124K, creatinine overall stalbe at 1.78, calcium  8.8, LFTs in range.  -Most recent myeloma labs from 08/06/2024 detected no M protein, with stable kappa light chain mildly elevated at 23.8. Today's myeloma labs are pending.  -Patient notes no significant toxicities from his current treatment regimen -Will continue maintenance treatment with Dara/Revlimid /dex -Continue Xgeva  every 28 days -Continue Revlimid  10 mg 3 weeks on 1 week off -Continue aspirin  81 mg p.o. daily for VTE prophylaxis  -Continue with acyclovir  400 mg BID for shingles prophylaxis.    FOLLOW-UP: Per integrated scheduling  All of the patient's questions were answered with apparent satisfaction. The patient knows to call the clinic with any problems,  questions or concerns.   I have spent a total of 30 minutes minutes of face-to-face and non-face-to-face time, preparing to see the patient, performing a medically appropriate examination,  counseling and educating the patient,  documenting clinical information in the electronic health record, independently interpreting results and communicating results to the patient, and care coordination.   Johnston Police PA-C Dept of Hematology and Oncology [facility] Phone: [phone]

## 2024-09-04 NOTE — Progress Notes (Signed)
 Authorization for Xgeva  expired 08/22/24. Okay to proceed with treatment today per Darlena. PA team to expedite authorization request.  Harlene Nasuti, PharmD Oncology Infusion Pharmacist 09/04/2024 1:15 PM

## 2024-09-04 NOTE — Patient Instructions (Addendum)
 [facility] - A DEPT OF [facility]  Discharge Instructions: Thank you for choosing [facility] to provide your oncology and hematology care.   If you have a lab appointment with the Cancer Center, please go directly to the Cancer Center and check in at the registration area.   Wear comfortable clothing and clothing appropriate for easy access to any Portacath or PICC line.   We strive to give you quality time with your provider. You may need to reschedule your appointment if you arrive late (15 or more minutes).  Arriving late affects you and other patients whose appointments are after yours.  Also, if you miss three or more appointments without notifying the office, you may be dismissed from the clinic at the provider's discretion.      For prescription refill requests, have your pharmacy contact our office and allow 72 hours for refills to be completed.    Today you received the following chemotherapy and/or immunotherapy agents: Darzalex  faspro   To help prevent nausea and vomiting after your treatment, we encourage you to take your nausea medication as directed.  BELOW ARE SYMPTOMS THAT SHOULD BE REPORTED IMMEDIATELY: *FEVER GREATER THAN 100.4 F (38 C) OR HIGHER *CHILLS OR SWEATING *NAUSEA AND VOMITING THAT IS NOT CONTROLLED WITH YOUR NAUSEA MEDICATION *UNUSUAL SHORTNESS OF BREATH *UNUSUAL BRUISING OR BLEEDING *URINARY PROBLEMS (pain or burning when urinating, or frequent urination) *BOWEL PROBLEMS (unusual diarrhea, constipation, pain near the anus) TENDERNESS IN MOUTH AND THROAT WITH OR WITHOUT PRESENCE OF ULCERS (sore throat, sores in mouth, or a toothache) UNUSUAL RASH, SWELLING OR PAIN  UNUSUAL VAGINAL DISCHARGE OR ITCHING   Items with * indicate a potential emergency and should be followed up as soon as possible or go to the Emergency Department if any problems should occur.  Please show the CHEMOTHERAPY ALERT CARD or  IMMUNOTHERAPY ALERT CARD at check-in to the Emergency Department and triage nurse.  Should you have questions after your visit or need to cancel or reschedule your appointment, please contact [facility] - A DEPT OF [facility]  Dept: [phone]  and follow the prompts.  Office hours are 8:00 a.m. to 4:30 p.m. Monday - Friday. Please note that voicemails left after 4:00 p.m. may not be returned until the following business day.  We are closed weekends and major holidays. You have access to a nurse at all times for urgent questions. Please call the main number to the clinic Dept: [phone] and follow the prompts.   For any non-urgent questions, you may also contact your provider using MyChart. We now offer e-Visits for anyone 55 and older to request care online for non-urgent symptoms. For details visit [email or web address].   Also download the MyChart app! Go to the app store, search MyChart, open the app, select , and log in with your MyChart username and password.  Denosumab  Injection (Oncology) What is this medication? DENOSUMAB  (den oh SUE mab) prevents weakened bones caused by cancer. It may also be used to treat noncancerous bone tumors that cannot be removed by surgery. It can also be used to treat high calcium  levels in the blood caused by cancer. It works by blocking a protein that causes bones to break down quickly. This slows down the release of calcium  from bones, which lowers calcium  levels in your blood. It also makes your bones stronger and less likely to break (fracture). This medicine may  be used for other purposes; ask your health care provider or pharmacist if you have questions. COMMON BRAND NAME(S): XGEVA  What should I tell my care team before I take this medication? They need to know if you have any of these conditions: Dental disease Having surgery or tooth extraction Infection Kidney disease Low levels of calcium  or  vitamin D  in the blood Malnutrition On hemodialysis Skin conditions or sensitivity Thyroid  or parathyroid disease An unusual reaction to denosumab , other medications, foods, dyes, or preservatives Pregnant or trying to get pregnant Breast-feeding How should I use this medication? This medication is for injection under the skin. It is given by your care team in a hospital or clinic setting. A special MedGuide will be given to you before each treatment. Be sure to read this information carefully each time. Talk to your care team about the use of this medication in children. While it may be prescribed for children as young as 13 years for selected conditions, precautions do apply. Overdosage: If you think you have taken too much of this medicine contact a poison control center or emergency room at once. NOTE: This medicine is only for you. Do not share this medicine with others. What if I miss a dose? Keep appointments for follow-up doses. It is important not to miss your dose. Call your care team if you are unable to keep an appointment. What may interact with this medication? Do not take this medication with any of the following: Other medications containing denosumab  This medication may also interact with the following: Medications that lower your chance of fighting infection Steroid medications, such as prednisone or cortisone This list may not describe all possible interactions. Give your health care provider a list of all the medicines, herbs, non-prescription drugs, or dietary supplements you use. Also tell them if you smoke, drink alcohol , or use illegal drugs. Some items may interact with your medicine. What should I watch for while using this medication? Your condition will be monitored carefully while you are receiving this medication. You may need blood work while taking this medication. This medication may increase your risk of getting an infection. Call your care team for advice  if you get a fever, chills, sore throat, or other symptoms of a cold or flu. Do not treat yourself. Try to avoid being around people who are sick. You should make sure you get enough calcium  and vitamin D  while you are taking this medication, unless your care team tells you not to. Discuss the foods you eat and the vitamins you take with your care team. Some people who take this medication have severe bone, joint, or muscle pain. This medication may also increase your risk for jaw problems or a broken thigh bone. Tell your care team right away if you have severe pain in your jaw, bones, joints, or muscles. Tell your care team if you have any pain that does not go away or that gets worse. Talk to your care team if you may be pregnant. Serious birth defects can occur if you take this medication during pregnancy and for 5 months after the last dose. You will need a negative pregnancy test before starting this medication. Contraception is recommended while taking this medication and for 5 months after the last dose. Your care team can help you find the option that works for you. What side effects may I notice from receiving this medication? Side effects that you should report to your care team as soon as possible: Allergic  reactions--skin rash, itching, hives, swelling of the face, lips, tongue, or throat Bone, joint, or muscle pain Low calcium  level--muscle pain or cramps, confusion, tingling, or numbness in the hands or feet Osteonecrosis of the jaw--pain, swelling, or redness in the mouth, numbness of the jaw, poor healing after dental work, unusual discharge from the mouth, visible bones in the mouth Side effects that usually do not require medical attention (report to your care team if they continue or are bothersome): Cough Diarrhea Fatigue Headache Nausea This list may not describe all possible side effects. Call your doctor for medical advice about side effects. You may report side effects to FDA  at 1-800-FDA-1088. Where should I keep my medication? This medication is given in a hospital or clinic. It will not be stored at home. NOTE: This sheet is a summary. It may not cover all possible information. If you have questions about this medicine, talk to your doctor, pharmacist, or health care provider.  2024 Elsevier/Gold Standard (2022-04-27 00:00:00)

## 2024-09-05 LAB — KAPPA/LAMBDA LIGHT CHAINS
Kappa free light chain: 22.6 mg/L — ABNORMAL HIGH (ref 3.3–19.4)
Kappa, lambda light chain ratio: 1.1 (ref 0.26–1.65)
Lambda free light chains: 20.6 mg/L (ref 5.7–26.3)

## 2024-09-06 ENCOUNTER — Other Ambulatory Visit: Payer: Self-pay

## 2024-09-07 LAB — MULTIPLE MYELOMA PANEL, SERUM
Albumin SerPl Elph-Mcnc: 3.5 g/dL (ref 2.9–4.4)
Albumin/Glob SerPl: 1.5 (ref 0.7–1.7)
Alpha 1: 0.2 g/dL (ref 0.0–0.4)
Alpha2 Glob SerPl Elph-Mcnc: 0.7 g/dL (ref 0.4–1.0)
B-Globulin SerPl Elph-Mcnc: 0.8 g/dL (ref 0.7–1.3)
Gamma Glob SerPl Elph-Mcnc: 0.7 g/dL (ref 0.4–1.8)
Globulin, Total: 2.5 g/dL (ref 2.2–3.9)
IgA: 98 mg/dL (ref 61–437)
IgG (Immunoglobin G), Serum: 844 mg/dL (ref 603–1613)
IgM (Immunoglobulin M), Srm: 8 mg/dL — ABNORMAL LOW (ref 15–143)
Total Protein ELP: 6 g/dL (ref 6.0–8.5)

## 2024-09-08 ENCOUNTER — Encounter: Payer: Self-pay | Admitting: Hematology

## 2024-09-17 ENCOUNTER — Other Ambulatory Visit: Payer: Self-pay | Admitting: Hematology

## 2024-09-17 DIAGNOSIS — C9002 Multiple myeloma in relapse: Secondary | ICD-10-CM

## 2024-09-19 ENCOUNTER — Encounter: Payer: Self-pay | Admitting: Hematology

## 2024-10-01 ENCOUNTER — Other Ambulatory Visit: Payer: Self-pay

## 2024-10-01 DIAGNOSIS — C9002 Multiple myeloma in relapse: Secondary | ICD-10-CM

## 2024-10-02 ENCOUNTER — Inpatient Hospital Stay (HOSPITAL_BASED_OUTPATIENT_CLINIC_OR_DEPARTMENT_OTHER): Admitting: Hematology

## 2024-10-02 ENCOUNTER — Inpatient Hospital Stay: Attending: Hematology

## 2024-10-02 ENCOUNTER — Telehealth: Payer: Self-pay | Admitting: Hematology

## 2024-10-02 ENCOUNTER — Other Ambulatory Visit: Payer: Self-pay

## 2024-10-02 ENCOUNTER — Inpatient Hospital Stay

## 2024-10-02 ENCOUNTER — Encounter: Payer: Self-pay | Admitting: Hematology

## 2024-10-02 VITALS — BP 131/70 | HR 53 | Temp 97.9°F | Resp 20 | Wt 172.6 lb

## 2024-10-02 DIAGNOSIS — K5900 Constipation, unspecified: Secondary | ICD-10-CM | POA: Insufficient documentation

## 2024-10-02 DIAGNOSIS — C9002 Multiple myeloma in relapse: Secondary | ICD-10-CM

## 2024-10-02 DIAGNOSIS — Z8711 Personal history of peptic ulcer disease: Secondary | ICD-10-CM | POA: Diagnosis not present

## 2024-10-02 DIAGNOSIS — M129 Arthropathy, unspecified: Secondary | ICD-10-CM | POA: Diagnosis not present

## 2024-10-02 DIAGNOSIS — Z7189 Other specified counseling: Secondary | ICD-10-CM | POA: Diagnosis not present

## 2024-10-02 DIAGNOSIS — Z7952 Long term (current) use of systemic steroids: Secondary | ICD-10-CM | POA: Diagnosis not present

## 2024-10-02 DIAGNOSIS — Z79624 Long term (current) use of inhibitors of nucleotide synthesis: Secondary | ICD-10-CM | POA: Diagnosis not present

## 2024-10-02 DIAGNOSIS — Z5112 Encounter for antineoplastic immunotherapy: Secondary | ICD-10-CM | POA: Insufficient documentation

## 2024-10-02 DIAGNOSIS — Z923 Personal history of irradiation: Secondary | ICD-10-CM | POA: Diagnosis not present

## 2024-10-02 DIAGNOSIS — Z7961 Long term (current) use of immunomodulator: Secondary | ICD-10-CM | POA: Insufficient documentation

## 2024-10-02 DIAGNOSIS — C9000 Multiple myeloma not having achieved remission: Secondary | ICD-10-CM | POA: Insufficient documentation

## 2024-10-02 DIAGNOSIS — Z5111 Encounter for antineoplastic chemotherapy: Secondary | ICD-10-CM

## 2024-10-02 DIAGNOSIS — I129 Hypertensive chronic kidney disease with stage 1 through stage 4 chronic kidney disease, or unspecified chronic kidney disease: Secondary | ICD-10-CM | POA: Insufficient documentation

## 2024-10-02 DIAGNOSIS — Z9221 Personal history of antineoplastic chemotherapy: Secondary | ICD-10-CM | POA: Diagnosis not present

## 2024-10-02 DIAGNOSIS — N183 Chronic kidney disease, stage 3 unspecified: Secondary | ICD-10-CM | POA: Diagnosis not present

## 2024-10-02 DIAGNOSIS — G4700 Insomnia, unspecified: Secondary | ICD-10-CM | POA: Insufficient documentation

## 2024-10-02 DIAGNOSIS — Z8719 Personal history of other diseases of the digestive system: Secondary | ICD-10-CM | POA: Insufficient documentation

## 2024-10-02 DIAGNOSIS — Z7982 Long term (current) use of aspirin: Secondary | ICD-10-CM | POA: Diagnosis not present

## 2024-10-02 DIAGNOSIS — M549 Dorsalgia, unspecified: Secondary | ICD-10-CM | POA: Diagnosis not present

## 2024-10-02 DIAGNOSIS — H547 Unspecified visual loss: Secondary | ICD-10-CM | POA: Diagnosis not present

## 2024-10-02 DIAGNOSIS — D649 Anemia, unspecified: Secondary | ICD-10-CM | POA: Diagnosis not present

## 2024-10-02 DIAGNOSIS — Z79899 Other long term (current) drug therapy: Secondary | ICD-10-CM | POA: Diagnosis not present

## 2024-10-02 LAB — CBC WITH DIFFERENTIAL (CANCER CENTER ONLY)
Abs Immature Granulocytes: 0.01 K/uL (ref 0.00–0.07)
Basophils Absolute: 0.1 K/uL (ref 0.0–0.1)
Basophils Relative: 3 %
Eosinophils Absolute: 0.4 K/uL (ref 0.0–0.5)
Eosinophils Relative: 9 %
HCT: 35.6 % — ABNORMAL LOW (ref 39.0–52.0)
Hemoglobin: 11.8 g/dL — ABNORMAL LOW (ref 13.0–17.0)
Immature Granulocytes: 0 %
Lymphocytes Relative: 43 %
Lymphs Abs: 1.9 K/uL (ref 0.7–4.0)
MCH: 29.9 pg (ref 26.0–34.0)
MCHC: 33.1 g/dL (ref 30.0–36.0)
MCV: 90.4 fL (ref 80.0–100.0)
Monocytes Absolute: 0.3 K/uL (ref 0.1–1.0)
Monocytes Relative: 7 %
Neutro Abs: 1.7 K/uL (ref 1.7–7.7)
Neutrophils Relative %: 38 %
Platelet Count: 145 K/uL — ABNORMAL LOW (ref 150–400)
RBC: 3.94 MIL/uL — ABNORMAL LOW (ref 4.22–5.81)
RDW: 16.3 % — ABNORMAL HIGH (ref 11.5–15.5)
WBC Count: 4.3 K/uL (ref 4.0–10.5)
nRBC: 0 % (ref 0.0–0.2)

## 2024-10-02 LAB — CMP (CANCER CENTER ONLY)
ALT: 11 U/L (ref 0–44)
AST: 13 U/L — ABNORMAL LOW (ref 15–41)
Albumin: 3.8 g/dL (ref 3.5–5.0)
Alkaline Phosphatase: 49 U/L (ref 38–126)
Anion gap: 4 — ABNORMAL LOW (ref 5–15)
BUN: 15 mg/dL (ref 8–23)
CO2: 28 mmol/L (ref 22–32)
Calcium: 8.5 mg/dL — ABNORMAL LOW (ref 8.9–10.3)
Chloride: 108 mmol/L (ref 98–111)
Creatinine: 1.77 mg/dL — ABNORMAL HIGH (ref 0.61–1.24)
GFR, Estimated: 39 mL/min — ABNORMAL LOW (ref >60)
Glucose, Bld: 77 mg/dL (ref 70–99)
Potassium: 3.9 mmol/L (ref 3.5–5.1)
Sodium: 140 mmol/L (ref 135–145)
Total Bilirubin: 0.4 mg/dL (ref 0.0–1.2)
Total Protein: 6.1 g/dL — ABNORMAL LOW (ref 6.5–8.1)

## 2024-10-02 MED ORDER — ACETAMINOPHEN 325 MG PO TABS
650.0000 mg | ORAL_TABLET | Freq: Once | ORAL | Status: AC
  Administered 2024-10-02: 650 mg via ORAL
  Filled 2024-10-02: qty 2

## 2024-10-02 MED ORDER — DIPHENHYDRAMINE HCL 25 MG PO CAPS
50.0000 mg | ORAL_CAPSULE | Freq: Once | ORAL | Status: AC
  Administered 2024-10-02: 50 mg via ORAL
  Filled 2024-10-02: qty 2

## 2024-10-02 MED ORDER — DEXAMETHASONE 4 MG PO TABS
8.0000 mg | ORAL_TABLET | Freq: Once | ORAL | Status: AC
  Administered 2024-10-02: 8 mg via ORAL
  Filled 2024-10-02: qty 2

## 2024-10-02 MED ORDER — DENOSUMAB 120 MG/1.7ML ~~LOC~~ SOLN
120.0000 mg | Freq: Once | SUBCUTANEOUS | Status: AC
  Administered 2024-10-02: 120 mg via SUBCUTANEOUS
  Filled 2024-10-02: qty 1.7

## 2024-10-02 MED ORDER — DARATUMUMAB-HYALURONIDASE-FIHJ 1800-30000 MG-UT/15ML ~~LOC~~ SOLN
1800.0000 mg | Freq: Once | SUBCUTANEOUS | Status: AC
  Administered 2024-10-02: 1800 mg via SUBCUTANEOUS
  Filled 2024-10-02: qty 15

## 2024-10-02 MED ORDER — FAMOTIDINE 20 MG PO TABS
20.0000 mg | ORAL_TABLET | Freq: Once | ORAL | Status: AC
  Administered 2024-10-02: 20 mg via ORAL
  Filled 2024-10-02: qty 1

## 2024-10-02 NOTE — Telephone Encounter (Signed)
 I spoke with Sharolyn to confirm his appointments for today as requested through staff message.

## 2024-10-02 NOTE — Progress Notes (Signed)
 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  (06/02/2023) Marcus Hatfield is a wonderful 78 y.o. 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   Patient was seen by me as an inpatient on 05/20/2023.  He noted that his vision may be slightly better with the steroid. Had some mild insomnia but it was not too bothersome. Has been seen by radiation oncology and is going to be set up for CT simulation and to start radiation 05/21/2023.   Did have a CT chest abdomen pelvis without contrast to evaluate for any other soft tissue metastatic disease and whole-body skeletal survey to determine other overt progression and to rule out the possibility of further source of metastatic disease.   Today, he is accompanied by two family members. He reports that he is feeling well overall. He reports that his vision has improved and returned to baseline. He denies any glares in his vision which were previously present. He is able to count fingers and notes improved sight of colors on television.    His p.o. intake is normal and he notes drinking 4 16oz bottles daily. He denies any fevers, chills, night sweats, new back pain, fatigue, posterior neck pain, or abdominal pain.   He reports some nausea with previously taking Revlimid . Patient reports that he has an upcoming dental appointment.  TREATMENT HISTORY 05/23/2016: Induction chemotherapy with bortezomib 1.3 mg/m2 weekly and dexamethasone  40 mg weekly using 28-day cycles. Plans to add lenalidomide  and Zometa  with cycle 2 (06/20/2016).  07/18/2016: Added lenalidomide  10 mg days 1-21 out of 28 days,  delayed starting due to insurance. Started lenalidomide  on 07/18/2016 with cycle 3. Achieved a PR after 4 cycles of treatment (2 cycles containing lenalidomide ). 11/14/2016: Treatment changed to carfilzomib  (D1/8/15) and dexamethasone  to achieve a deeper response after having plateaued on prior regimen.  01/24/2017: Bone marrow biopsy showed no increased plasma cells but was positive for Congo red stain. Diagnosis of amyloid was made (AL pending confirmation with mass spect). 02/01/2017: Cyclophosphamide added to existing regimen on D1/8/15 to deepen response and due to amyloid deposits found on bone marrow biopsy. 06/19/2017: Transitioned to D1/15 dosing in an attempt to transition to maintenance. His M-spike was 0.2 g/dL, consistent with a PR but close to VGPR. 10/02/2017: Discontinued dexamethasone  but continued on cyclophosphamide and carfilzomib  after showing persistent stable disease with M-spike 0.29 g/dL. 10/2017: Mr. Tangen held all therapy starting in 10/2017 since he believed it was not needed. A long discussion was had with him during visit in 04/2018 regarding concern for chemical relapse but he refused to get labs drawn and did not show interest in resuming some type of therapy.  07/16/2018 - 06/11/2019: He achieved an unconfirmed remission (VGPR) when re-assessed and maintained no detectable disease by serology since while on surveillance. 10/07/2019: Routine serology revealed a new M-spike of 0.56 g/dL with IFIX IgG kappa. Patient maintained on surveillance with repeat labs planned for 11/06/2019. 11/06/2019: Confirmed chemical relapse with 2 consecutive positive m-spikes (0.56 and 0.47). A repeat serology from 12/16/19 showed an M-spike of 0.86 and restaging was was performed with bone marrow biopsy and PETCT scan in 12/2019, which showed 3% kappa restricted plasma cells and  no evidence of new/active lesions. Patient remained on active surveillance per his request.  05/20/2023: Underwent CT  head due to vision disturbance to the left eye x 3 weeks. Findings included large mass at the skull base, eroding and expanding the clivus,also involving the sphenoid bone and the petrous portions of both temporal bones. The mass broadly abuts both optic nerves between the chiasm and orbital apices. Mass also broadly involves both cavernous sinuses. 05/20/2023: MRI brain: 7.8 x 5.6 x 3.3 cm enhancing mass lesion at the skull base extending along the petrous ridge bilaterally, greater on the right. 05/21/2023: CT CAP: Numerous lytic osseous lesions, consistent with patient's history of multiple myeloma. A large lucent lesion of the T6 vertebral body involves the posterior cortex, extension into the spinal canal can not be excluded 05/22/2023-06/02/2023: Received palliative radiation to base of skull and medial right clavicle. 06/13/2023: Started Cycle 1, Day 1 of Daratumumab /Revlimid /Dex.  06/19/2023: Recommend to hold Revlimid  as patient cannot take aspirin  therapy due to recent GI bleed. 09/19/2023: Resumed Revlimid  10 mg.    CURRENT THERAPY: Daratumumab  + Revlimid  maintenance  INTERVAL HISTORY: Marcus Hatfield is a 78 y.o. male who is here for continued evaluation and management of multiple myeloma with base of skull plasmacytoma. He is accompanied by his wife today.  Last seen by me on 08/06/2024 and was doing well overall without any complaints.   Today, he says that he has been doing well and does not have any complaints. Denies any new infection issues, dental issues, vision changes, chest pain, SOB, or  leg swelling.  He reports that in August he was ill with PNA, which was treated with 750mg  Levaquin  x7 days, but otherwise no new medications.  Endorses back pain when laying down. His wife notes that he does do pushups against the kitchen sink.  Additionally does note experiencing intermittent constipation, but not much diarrhea. As well as experiencing 1 headache.  Does say that he is UTD on  Influenza and RSV, has not had his Covid-19, and isn't sure about the status of his PNA immunity.   MEDICAL HISTORY:  Past Medical History:  Diagnosis Date   Anemia    Arthritis    CKD (chronic kidney disease) stage 3, GFR 30-59 ml/min (HCC) 20015   Creat 1.9   Hypertension    Multiple myeloma (HCC) 2016   [facility] heme onc   Prostate disorder 02/2017   SURGICAL HISTORY: Past Surgical History:  Procedure Laterality Date   BIOPSY  06/06/2023   Procedure: BIOPSY;  Surgeon: Federico Rosario BROCKS, MD;  Location: [facility] ENDOSCOPY;  Service: Gastroenterology;;   ESOPHAGOGASTRODUODENOSCOPY (EGD) WITH PROPOFOL  N/A 06/06/2023   Procedure: ESOPHAGOGASTRODUODENOSCOPY (EGD) WITH PROPOFOL ;  Surgeon: Federico Rosario BROCKS, MD;  Location[facility] ENDOSCOPY;  Service: Gastroenterology;  Laterality: N/A;   EYE SURGERY Bilateral 01/30/2018   KNEE SURGERY     over ten years ago   NEPHRECTOMY Right 03/2015   ROBOTIC ASSITED PARTIAL NEPHRECTOMY Left 07/07/2021   Procedure: XI ROBOTIC ASSITED PARTIAL NEPHRECTOMY;  Surgeon: Alvaro Hummer, MD;  Location: [facility] ORS;  Service: Urology;  Laterality: Left;  5 HRS   XI ROBOTIC ASSISTED SIMPLE PROSTATECTOMY N/A 07/07/2021   Procedure: XI ROBOTIC ASSISTED SIMPLE PROSTATECTOMY;  Surgeon: Alvaro Hummer, MD;  Location: [facility] ORS;  Service: Urology;  Laterality: N/A;    SOCIAL HISTORY: Social History   Socioeconomic History   Marital status: Married    Spouse name: Museum/gallery curator   Number of children: 2   Years of education: Not  on file   Highest education level: Not on file  Occupational History   Occupation: Retired  Tobacco Use   Smoking status: Never   Smokeless tobacco: Never  Vaping Use   Vaping status: Never Used  Substance and Sexual Activity   Alcohol  use: No   Drug use: No   Sexual activity: Not Currently  Other Topics Concern   Not on file  Social History Narrative   Not on file   Social Drivers of Health   Financial Resource Strain: Not on file  Food  Insecurity: Patient Declined (06/05/2023)   Hunger Vital Sign    Worried About Running Out of Food in the Last Year: Patient declined    Ran Out of Food in the Last Year: Patient declined  Transportation Needs: Patient Declined (06/05/2023)   PRAPARE - Administrator, Civil Service (Medical): Patient declined    Lack of Transportation (Non-Medical): Patient declined  Physical Activity: Not on file  Stress: Not on file  Social Connections: Not on file  Intimate Partner Violence: Patient Declined (06/05/2023)   Humiliation, Afraid, Rape, and Kick questionnaire    Fear of Current or Ex-Partner: Patient declined    Emotionally Abused: Patient declined    Physically Abused: Patient declined    Sexually Abused: Patient declined    FAMILY HISTORY: Family History  Problem Relation Age of Onset   Diabetes Father    Hypertension Brother     ALLERGIES:  is allergic to penicillins, latex, and tape.  MEDICATIONS:  Current Outpatient Medications  Medication Sig Dispense Refill   acyclovir  (ZOVIRAX ) 400 MG tablet TAKE 1 TABLET BY MOUTH TWICE A DAY 180 tablet 3   amLODipine  (NORVASC ) 10 MG tablet Take 0.5 tablets (5 mg total) by mouth daily. (Patient taking differently: Take 10 mg by mouth daily.)     aspirin  81 MG chewable tablet Chew 81 mg by mouth daily.     benzonatate  (TESSALON ) 100 MG capsule Take 1 capsule by mouth every 8 (eight) hours for cough. 21 capsule 0   carvedilol  (COREG ) 6.25 MG tablet Take 1 tablet (6.25 mg total) by mouth 2 (two) times daily with a meal.     diclofenac Sodium (VOLTAREN) 1 % GEL Apply 2 g topically daily as needed (for pain).     docusate sodium  (COLACE) 100 MG capsule Take 1 capsule (100 mg total) by mouth daily as needed for mild constipation.     ferrous sulfate  325 (65 FE) MG tablet Take 1 tablet (325 mg total) by mouth 2 (two) times daily as needed (iron).     finasteride  (PROSCAR ) 5 MG tablet Take 5 mg by mouth daily.     fluticasone  (FLONASE )  50 MCG/ACT nasal spray Place 2 sprays into both nostrils daily as needed for allergies.     furosemide  (LASIX ) 20 MG tablet Take 0.5 tablets (10 mg total) by mouth every other day. 30 tablet    lactulose  (CHRONULAC ) 10 GM/15ML solution Take 10 g by mouth daily as needed for constipation.  5   lenalidomide  (REVLIMID ) 10 MG capsule Take 1 capsule (10 mg total) by mouth daily for 21 days. Take 1 capsule (10 mg total) by mouth for 21 days. Take 7 days off . Repeat cycle. 21 capsule 0   levofloxacin  (LEVAQUIN ) 750 MG tablet Take 1 tablet (750 mg total) by mouth daily. 7 tablet 0   ondansetron  (ZOFRAN ) 8 MG tablet Take 1 tablet (8 mg total) by mouth every 8 (eight) hours as  needed for nausea or vomiting. 30 tablet 1   oxyCODONE -acetaminophen  (PERCOCET/ROXICET) 5-325 MG tablet Take 0.5-1 tablets by mouth every 6 (six) hours as needed for severe pain (pain score 7-10). 20 tablet 0   pantoprazole  (PROTONIX ) 40 MG tablet Take 1 tablet (40 mg total) by mouth 2 (two) times daily. Take bid x 10 weeks, followed by daily thereafter 90 tablet 3   prochlorperazine  (COMPAZINE ) 10 MG tablet Take 1 tablet (10 mg total) by mouth every 6 (six) hours as needed for nausea or vomiting. 30 tablet 1   tamsulosin  (FLOMAX ) 0.4 MG CAPS capsule Take 1 capsule (0.4 mg total) by mouth daily after supper. 30 capsule 1   telmisartan (MICARDIS) 80 MG tablet Take 80 mg by mouth daily.     No current facility-administered medications for this visit.    REVIEW OF SYSTEMS:   .10 Point review of Systems was done is negative except as noted above.  PHYSICAL EXAMINATION: .BP 131/70   Pulse (!) 53   Temp 97.9 F (36.6 C)   Resp 20   Wt 172 lb 9.6 oz (78.3 kg)   SpO2 99%   BMI 24.77 kg/m  . GENERAL:alert, in no acute distress and comfortable SKIN: no acute rashes, no significant lesions EYES: conjunctiva are pink and non-injected, sclera anicteric OROPHARYNX: MMM, no exudates, no oropharyngeal erythema or ulceration NECK:  supple, no JVD LYMPH:  no palpable lymphadenopathy in the cervical, axillary or inguinal regions LUNGS: clear to auscultation b/l with normal respiratory effort HEART: regular rate & rhythm ABDOMEN:  normoactive bowel sounds , non tender, not distended. Extremity: no pedal edema PSYCH: alert & oriented x 3 with fluent speech NEURO: no focal motor/sensory deficits  LABORATORY DATA:  I have reviewed the data as listed .    Latest Ref Rng & Units 10/02/2024   10:30 AM 09/04/2024   10:16 AM 08/06/2024    9:20 AM  CBC  WBC 4.0 - 10.5 K/uL 4.3  3.7  3.1   Hemoglobin 13.0 - 17.0 g/dL 88.1  87.8  88.1   Hematocrit 39.0 - 52.0 % 35.6  36.6  34.7   Platelets 150 - 400 K/uL 145  124  165        Latest Ref Rng & Units 10/02/2024   10:30 AM 09/04/2024   10:16 AM 08/06/2024    9:20 AM  CMP  Glucose 70 - 99 mg/dL 77  54  86   BUN 8 - 23 mg/dL 15  19  14    Creatinine 0.61 - 1.24 mg/dL 8.22  8.21  8.32   Sodium 135 - 145 mmol/L 140  143  139   Potassium 3.5 - 5.1 mmol/L 3.9  4.3  3.8   Chloride 98 - 111 mmol/L 108  108  107   CO2 22 - 32 mmol/L 28  31  27    Calcium  8.9 - 10.3 mg/dL 8.5  8.8  8.7   Total Protein 6.5 - 8.1 g/dL 6.1  6.5  6.4   Total Bilirubin 0.0 - 1.2 mg/dL 0.4  0.5  0.5   Alkaline Phos 38 - 126 U/L 49  50  47   AST 15 - 41 U/L 13  11  17    ALT 0 - 44 U/L 11  11  24     MULTIPLE MYELOMA PANEL & KAPPA/LAMDA LIGHT CHAINS 03/2024 - 08/2024    RADIOGRAPHIC STUDIES: I have personally reviewed the radiological images as listed and agreed with the findings in the report.  No results found.   ASSESSMENT & PLAN:   Marcus Hatfield is a 78 y.o. male who presents to the clinic for continued management for multiple myeloma.   #IgG kappa multiple myeloma:  -See oncologic history as above.  -Current treatment includes maintenance therapy with daratumumab /Revlimid /Dexamethasone   #H/O GI bleeding: --Admitted from 06/05/2023-06/06/2023.EGD showed nonbleeding duodenal ulcers. Received 2  units of PRBC. GI recommended PPI twice daily x 10 weeks and then daily thereafter.  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 --Vision has improved after completion of radiation therapy.  --Monitor for now.   #History of multifocal renal cell carcinoma --Underwent right nephrectomy on 02/03/2016.  He received cryotherapy to his left renal mass in April 2017. --Underwent laparoscopic left partial nephrectomy and simple prostatectomy with Dr. Alvaro on 07/07/2021. 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 Tumor is limited to the kidney (pT1a).   PLAN: - Discussed lab results on 10/02/2024 in detail with patient: CBC showed WBC of 4.3K increased from 3.7K, Hemoglobin of 11.8 decreased from 12.1, and PLTs of 145K increased from 124K CMP with Creatinine 1.77 decreased from 1.87.   Stable CKD. M protein not observed. Kappa/Lambda Lights Chains normal.  Continues to be in remission - Patient notes no significant toxicities from his current treatment regimen Will continue maintenance treatment with Dara Faspro/Revlimid /dexamethasone   Titrate steroids down from 12 mg to 8 mg.  - Continue Xgeva  every 28 days - Continue Revlimid  10 mg 3 weeks on 1 week off - Continue aspirin  81 mg p.o. daily for VTE prophylaxis  - Continue with acyclovir  400 mg BID for shingles prophylaxis.   - Back pain likely related to muscle strain from excessive weight bearing exercise.  Advised to avoid pushups/pullup, can do wall supported squats, brisk walking (can add 1 pound ankle weights), arm curls or chest presses.   - Vaccine counseling provided: recommend staying UTD with immunization  FOLLOW-UP: Per integrated scheduling  The total time spent in the appointment was 32 minutes*  .  All of the patient's questions were answered with apparent satisfaction. The patient knows to call the clinic with any problems, questions or concerns.   Emaline Saran MD MS AAHIVMS [facility] Hematology/Oncology Physician [facility]  .*Total Encounter Time as defined by the Centers for Medicare and Medicaid Services includes, in addition to the face-to-face time of a patient visit (documented in the note above) non-face-to-face time: obtaining and reviewing outside history, ordering and reviewing medications, tests or procedures, care coordination (communications with other health care professionals or caregivers) and documentation in the medical record.  I,  Damien Lagle,acting as a scribe for Emaline Saran, MD.,have documented all relevant documentation on the behalf of Emaline Saran, MD,as directed by  Emaline Saran, MD while in the presence of Emaline Saran, MD.  I have reviewed the above documentation for accuracy and completeness, and I agree with the above. Emaline Candida Saran MD.

## 2024-10-02 NOTE — Patient Instructions (Signed)

## 2024-10-03 LAB — KAPPA/LAMBDA LIGHT CHAINS
Kappa free light chain: 19.9 mg/L — ABNORMAL HIGH (ref 3.3–19.4)
Kappa, lambda light chain ratio: 0.99 (ref 0.26–1.65)
Lambda free light chains: 20.2 mg/L (ref 5.7–26.3)

## 2024-10-04 ENCOUNTER — Other Ambulatory Visit: Payer: Self-pay

## 2024-10-07 LAB — MULTIPLE MYELOMA PANEL, SERUM
Albumin SerPl Elph-Mcnc: 3.4 g/dL (ref 2.9–4.4)
Albumin/Glob SerPl: 1.5 (ref 0.7–1.7)
Alpha 1: 0.2 g/dL (ref 0.0–0.4)
Alpha2 Glob SerPl Elph-Mcnc: 0.7 g/dL (ref 0.4–1.0)
B-Globulin SerPl Elph-Mcnc: 0.8 g/dL (ref 0.7–1.3)
Gamma Glob SerPl Elph-Mcnc: 0.6 g/dL (ref 0.4–1.8)
Globulin, Total: 2.3 g/dL (ref 2.2–3.9)
IgA: 92 mg/dL (ref 61–437)
IgG (Immunoglobin G), Serum: 848 mg/dL (ref 603–1613)
IgM (Immunoglobulin M), Srm: 10 mg/dL — ABNORMAL LOW (ref 15–143)
Total Protein ELP: 5.7 g/dL — ABNORMAL LOW (ref 6.0–8.5)

## 2024-10-09 ENCOUNTER — Encounter: Payer: Self-pay | Admitting: Hematology

## 2024-10-14 ENCOUNTER — Other Ambulatory Visit: Payer: Self-pay | Admitting: Hematology

## 2024-10-14 DIAGNOSIS — C9002 Multiple myeloma in relapse: Secondary | ICD-10-CM

## 2024-10-21 ENCOUNTER — Other Ambulatory Visit: Payer: Self-pay

## 2024-10-30 ENCOUNTER — Inpatient Hospital Stay

## 2024-10-30 ENCOUNTER — Encounter: Payer: Self-pay | Admitting: Hematology

## 2024-10-30 ENCOUNTER — Inpatient Hospital Stay: Attending: Hematology

## 2024-10-30 ENCOUNTER — Inpatient Hospital Stay: Admitting: Physician Assistant

## 2024-10-30 VITALS — BP 116/64 | HR 52 | Temp 97.7°F | Resp 20 | Wt 172.8 lb

## 2024-10-30 DIAGNOSIS — Z5112 Encounter for antineoplastic immunotherapy: Secondary | ICD-10-CM | POA: Insufficient documentation

## 2024-10-30 DIAGNOSIS — C9002 Multiple myeloma in relapse: Secondary | ICD-10-CM | POA: Diagnosis not present

## 2024-10-30 DIAGNOSIS — Z79899 Other long term (current) drug therapy: Secondary | ICD-10-CM | POA: Insufficient documentation

## 2024-10-30 DIAGNOSIS — Z7189 Other specified counseling: Secondary | ICD-10-CM

## 2024-10-30 LAB — CBC WITH DIFFERENTIAL (CANCER CENTER ONLY)
Abs Immature Granulocytes: 0 K/uL (ref 0.00–0.07)
Basophils Absolute: 0.1 K/uL (ref 0.0–0.1)
Basophils Relative: 2 %
Eosinophils Absolute: 0.2 K/uL (ref 0.0–0.5)
Eosinophils Relative: 6 %
HCT: 37.8 % — ABNORMAL LOW (ref 39.0–52.0)
Hemoglobin: 12.5 g/dL — ABNORMAL LOW (ref 13.0–17.0)
Immature Granulocytes: 0 %
Lymphocytes Relative: 49 %
Lymphs Abs: 1.8 K/uL (ref 0.7–4.0)
MCH: 30.2 pg (ref 26.0–34.0)
MCHC: 33.1 g/dL (ref 30.0–36.0)
MCV: 91.3 fL (ref 80.0–100.0)
Monocytes Absolute: 0.3 K/uL (ref 0.1–1.0)
Monocytes Relative: 7 %
Neutro Abs: 1.3 K/uL — ABNORMAL LOW (ref 1.7–7.7)
Neutrophils Relative %: 36 %
Platelet Count: 116 K/uL — ABNORMAL LOW (ref 150–400)
RBC: 4.14 MIL/uL — ABNORMAL LOW (ref 4.22–5.81)
RDW: 16 % — ABNORMAL HIGH (ref 11.5–15.5)
WBC Count: 3.6 K/uL — ABNORMAL LOW (ref 4.0–10.5)
nRBC: 0 % (ref 0.0–0.2)

## 2024-10-30 LAB — COMPREHENSIVE METABOLIC PANEL WITH GFR
ALT: 10 U/L (ref 0–44)
AST: 13 U/L — ABNORMAL LOW (ref 15–41)
Albumin: 4 g/dL (ref 3.5–5.0)
Alkaline Phosphatase: 42 U/L (ref 38–126)
Anion gap: 5 (ref 5–15)
BUN: 16 mg/dL (ref 8–23)
CO2: 29 mmol/L (ref 22–32)
Calcium: 8.8 mg/dL — ABNORMAL LOW (ref 8.9–10.3)
Chloride: 107 mmol/L (ref 98–111)
Creatinine, Ser: 1.61 mg/dL — ABNORMAL HIGH (ref 0.61–1.24)
GFR, Estimated: 44 mL/min — ABNORMAL LOW (ref >60)
Glucose, Bld: 55 mg/dL — ABNORMAL LOW (ref 70–99)
Potassium: 3.6 mmol/L (ref 3.5–5.1)
Sodium: 141 mmol/L (ref 135–145)
Total Bilirubin: 0.6 mg/dL (ref 0.0–1.2)
Total Protein: 6.4 g/dL — ABNORMAL LOW (ref 6.5–8.1)

## 2024-10-30 MED ORDER — DIPHENHYDRAMINE HCL 25 MG PO CAPS
50.0000 mg | ORAL_CAPSULE | Freq: Once | ORAL | Status: AC
  Administered 2024-10-30: 50 mg via ORAL
  Filled 2024-10-30: qty 2

## 2024-10-30 MED ORDER — DEXAMETHASONE 4 MG PO TABS
8.0000 mg | ORAL_TABLET | Freq: Once | ORAL | Status: AC
  Administered 2024-10-30: 8 mg via ORAL
  Filled 2024-10-30: qty 2

## 2024-10-30 MED ORDER — DENOSUMAB 120 MG/1.7ML ~~LOC~~ SOLN
120.0000 mg | Freq: Once | SUBCUTANEOUS | Status: AC
  Administered 2024-10-30: 120 mg via SUBCUTANEOUS
  Filled 2024-10-30: qty 1.7

## 2024-10-30 MED ORDER — DARATUMUMAB-HYALURONIDASE-FIHJ 1800-30000 MG-UT/15ML ~~LOC~~ SOLN
1800.0000 mg | Freq: Once | SUBCUTANEOUS | Status: AC
  Administered 2024-10-30: 1800 mg via SUBCUTANEOUS
  Filled 2024-10-30: qty 15

## 2024-10-30 MED ORDER — ACETAMINOPHEN 325 MG PO TABS
650.0000 mg | ORAL_TABLET | Freq: Once | ORAL | Status: AC
  Administered 2024-10-30: 650 mg via ORAL
  Filled 2024-10-30: qty 2

## 2024-10-30 MED ORDER — FAMOTIDINE 20 MG PO TABS
20.0000 mg | ORAL_TABLET | Freq: Once | ORAL | Status: AC
  Administered 2024-10-30: 20 mg via ORAL
  Filled 2024-10-30: qty 1

## 2024-10-30 NOTE — Patient Instructions (Signed)
 [facility] - A DEPT OF [facility]  Discharge Instructions: Thank you for choosing [facility] to provide your oncology and hematology care.   If you have a lab appointment with the Cancer Center, please go directly to the Cancer Center and check in at the registration area.   Wear comfortable clothing and clothing appropriate for easy access to any Portacath or PICC line.   We strive to give you quality time with your provider. You may need to reschedule your appointment if you arrive late (15 or more minutes).  Arriving late affects you and other patients whose appointments are after yours.  Also, if you miss three or more appointments without notifying the office, you may be dismissed from the clinic at the provider's discretion.      For prescription refill requests, have your pharmacy contact our office and allow 72 hours for refills to be completed.    Today you received the following chemotherapy and/or immunotherapy agents: Darzalex  faspro, Xygeva   To help prevent nausea and vomiting after your treatment, we encourage you to take your nausea medication as directed.  BELOW ARE SYMPTOMS THAT SHOULD BE REPORTED IMMEDIATELY: *FEVER GREATER THAN 100.4 F (38 C) OR HIGHER *CHILLS OR SWEATING *NAUSEA AND VOMITING THAT IS NOT CONTROLLED WITH YOUR NAUSEA MEDICATION *UNUSUAL SHORTNESS OF BREATH *UNUSUAL BRUISING OR BLEEDING *URINARY PROBLEMS (pain or burning when urinating, or frequent urination) *BOWEL PROBLEMS (unusual diarrhea, constipation, pain near the anus) TENDERNESS IN MOUTH AND THROAT WITH OR WITHOUT PRESENCE OF ULCERS (sore throat, sores in mouth, or a toothache) UNUSUAL RASH, SWELLING OR PAIN  UNUSUAL VAGINAL DISCHARGE OR ITCHING   Items with * indicate a potential emergency and should be followed up as soon as possible or go to the Emergency Department if any problems should occur.  Please show the CHEMOTHERAPY ALERT CARD or  IMMUNOTHERAPY ALERT CARD at check-in to the Emergency Department and triage nurse.  Should you have questions after your visit or need to cancel or reschedule your appointment, please contact [facility] - A DEPT OF [facility]  Dept: [phone]  and follow the prompts.  Office hours are 8:00 a.m. to 4:30 p.m. Monday - Friday. Please note that voicemails left after 4:00 p.m. may not be returned until the following business day.  We are closed weekends and major holidays. You have access to a nurse at all times for urgent questions. Please call the main number to the clinic Dept: [phone] and follow the prompts.   For any non-urgent questions, you may also contact your provider using MyChart. We now offer e-Visits for anyone 46 and older to request care online for non-urgent symptoms. For details visit [email or web address].   Also download the MyChart app! Go to the app store, search MyChart, open the app, select [facility], and log in with your MyChart username and password.  Denosumab  Injection (Oncology) What is this medication? DENOSUMAB  (den oh SUE mab) prevents weakened bones caused by cancer. It may also be used to treat noncancerous bone tumors that cannot be removed by surgery. It can also be used to treat high calcium  levels in the blood caused by cancer. It works by blocking a protein that causes bones to break down quickly. This slows down the release of calcium  from bones, which lowers calcium  levels in your blood. It also makes your bones stronger and less likely to break (fracture). This medicine  may be used for other purposes; ask your health care provider or pharmacist if you have questions. COMMON BRAND NAME(S): XGEVA  What should I tell my care team before I take this medication? They need to know if you have any of these conditions: Dental disease Having surgery or tooth extraction Infection Kidney disease Low levels of calcium  or  vitamin D  in the blood Malnutrition On hemodialysis Skin conditions or sensitivity Thyroid  or parathyroid disease An unusual reaction to denosumab , other medications, foods, dyes, or preservatives Pregnant or trying to get pregnant Breast-feeding How should I use this medication? This medication is for injection under the skin. It is given by your care team in a hospital or clinic setting. A special MedGuide will be given to you before each treatment. Be sure to read this information carefully each time. Talk to your care team about the use of this medication in children. While it may be prescribed for children as young as 13 years for selected conditions, precautions do apply. Overdosage: If you think you have taken too much of this medicine contact a poison control center or emergency room at once. NOTE: This medicine is only for you. Do not share this medicine with others. What if I miss a dose? Keep appointments for follow-up doses. It is important not to miss your dose. Call your care team if you are unable to keep an appointment. What may interact with this medication? Do not take this medication with any of the following: Other medications containing denosumab  This medication may also interact with the following: Medications that lower your chance of fighting infection Steroid medications, such as prednisone or cortisone This list may not describe all possible interactions. Give your health care provider a list of all the medicines, herbs, non-prescription drugs, or dietary supplements you use. Also tell them if you smoke, drink alcohol , or use illegal drugs. Some items may interact with your medicine. What should I watch for while using this medication? Your condition will be monitored carefully while you are receiving this medication. You may need blood work while taking this medication. This medication may increase your risk of getting an infection. Call your care team for advice  if you get a fever, chills, sore throat, or other symptoms of a cold or flu. Do not treat yourself. Try to avoid being around people who are sick. You should make sure you get enough calcium  and vitamin D  while you are taking this medication, unless your care team tells you not to. Discuss the foods you eat and the vitamins you take with your care team. Some people who take this medication have severe bone, joint, or muscle pain. This medication may also increase your risk for jaw problems or a broken thigh bone. Tell your care team right away if you have severe pain in your jaw, bones, joints, or muscles. Tell your care team if you have any pain that does not go away or that gets worse. Talk to your care team if you may be pregnant. Serious birth defects can occur if you take this medication during pregnancy and for 5 months after the last dose. You will need a negative pregnancy test before starting this medication. Contraception is recommended while taking this medication and for 5 months after the last dose. Your care team can help you find the option that works for you. What side effects may I notice from receiving this medication? Side effects that you should report to your care team as soon as possible:  Allergic reactions--skin rash, itching, hives, swelling of the face, lips, tongue, or throat Bone, joint, or muscle pain Low calcium  level--muscle pain or cramps, confusion, tingling, or numbness in the hands or feet Osteonecrosis of the jaw--pain, swelling, or redness in the mouth, numbness of the jaw, poor healing after dental work, unusual discharge from the mouth, visible bones in the mouth Side effects that usually do not require medical attention (report to your care team if they continue or are bothersome): Cough Diarrhea Fatigue Headache Nausea This list may not describe all possible side effects. Call your doctor for medical advice about side effects. You may report side effects to FDA  at 1-800-FDA-1088. Where should I keep my medication? This medication is given in a hospital or clinic. It will not be stored at home. NOTE: This sheet is a summary. It may not cover all possible information. If you have questions about this medicine, talk to your doctor, pharmacist, or health care provider.  2024 Elsevier/Gold Standard (2022-04-27 00:00:00)

## 2024-10-31 LAB — KAPPA/LAMBDA LIGHT CHAINS
Kappa free light chain: 20.2 mg/L — ABNORMAL HIGH (ref 3.3–19.4)
Kappa, lambda light chain ratio: 1.02 (ref 0.26–1.65)
Lambda free light chains: 19.9 mg/L (ref 5.7–26.3)

## 2024-11-04 LAB — MULTIPLE MYELOMA PANEL, SERUM
Albumin SerPl Elph-Mcnc: 3.5 g/dL (ref 2.9–4.4)
Albumin/Glob SerPl: 1.5 (ref 0.7–1.7)
Alpha 1: 0.2 g/dL (ref 0.0–0.4)
Alpha2 Glob SerPl Elph-Mcnc: 0.6 g/dL (ref 0.4–1.0)
B-Globulin SerPl Elph-Mcnc: 0.8 g/dL (ref 0.7–1.3)
Gamma Glob SerPl Elph-Mcnc: 0.7 g/dL (ref 0.4–1.8)
Globulin, Total: 2.4 g/dL (ref 2.2–3.9)
IgA: 89 mg/dL (ref 61–437)
IgG (Immunoglobin G), Serum: 893 mg/dL (ref 603–1613)
IgM (Immunoglobulin M), Srm: 10 mg/dL — ABNORMAL LOW (ref 15–143)
M Protein SerPl Elph-Mcnc: 0.2 g/dL — ABNORMAL HIGH
Total Protein ELP: 5.9 g/dL — ABNORMAL LOW (ref 6.0–8.5)

## 2024-11-08 DIAGNOSIS — E785 Hyperlipidemia, unspecified: Secondary | ICD-10-CM | POA: Diagnosis not present

## 2024-11-08 DIAGNOSIS — D631 Anemia in chronic kidney disease: Secondary | ICD-10-CM | POA: Diagnosis not present

## 2024-11-08 DIAGNOSIS — Z1212 Encounter for screening for malignant neoplasm of rectum: Secondary | ICD-10-CM | POA: Diagnosis not present

## 2024-11-11 ENCOUNTER — Other Ambulatory Visit: Payer: Self-pay | Admitting: Hematology

## 2024-11-11 DIAGNOSIS — C9002 Multiple myeloma in relapse: Secondary | ICD-10-CM

## 2024-11-12 ENCOUNTER — Encounter: Payer: Self-pay | Admitting: Hematology

## 2024-11-18 DIAGNOSIS — M84412D Pathological fracture, left shoulder, subsequent encounter for fracture with routine healing: Secondary | ICD-10-CM | POA: Diagnosis not present

## 2024-11-18 DIAGNOSIS — N1831 Chronic kidney disease, stage 3a: Secondary | ICD-10-CM | POA: Diagnosis not present

## 2024-11-18 DIAGNOSIS — Z1331 Encounter for screening for depression: Secondary | ICD-10-CM | POA: Diagnosis not present

## 2024-11-18 DIAGNOSIS — H6991 Unspecified Eustachian tube disorder, right ear: Secondary | ICD-10-CM | POA: Diagnosis not present

## 2024-11-18 DIAGNOSIS — D631 Anemia in chronic kidney disease: Secondary | ICD-10-CM | POA: Diagnosis not present

## 2024-11-18 DIAGNOSIS — R0789 Other chest pain: Secondary | ICD-10-CM | POA: Diagnosis not present

## 2024-11-18 DIAGNOSIS — Z1339 Encounter for screening examination for other mental health and behavioral disorders: Secondary | ICD-10-CM | POA: Diagnosis not present

## 2024-11-18 DIAGNOSIS — M1990 Unspecified osteoarthritis, unspecified site: Secondary | ICD-10-CM | POA: Diagnosis not present

## 2024-11-18 DIAGNOSIS — M899 Disorder of bone, unspecified: Secondary | ICD-10-CM | POA: Diagnosis not present

## 2024-11-18 DIAGNOSIS — R82998 Other abnormal findings in urine: Secondary | ICD-10-CM | POA: Diagnosis not present

## 2024-11-18 DIAGNOSIS — C9000 Multiple myeloma not having achieved remission: Secondary | ICD-10-CM | POA: Diagnosis not present

## 2024-11-18 DIAGNOSIS — R972 Elevated prostate specific antigen [PSA]: Secondary | ICD-10-CM | POA: Diagnosis not present

## 2024-11-18 DIAGNOSIS — C641 Malignant neoplasm of right kidney, except renal pelvis: Secondary | ICD-10-CM | POA: Diagnosis not present

## 2024-11-18 DIAGNOSIS — I129 Hypertensive chronic kidney disease with stage 1 through stage 4 chronic kidney disease, or unspecified chronic kidney disease: Secondary | ICD-10-CM | POA: Diagnosis not present

## 2024-11-18 DIAGNOSIS — Z0000 Encounter for general adult medical examination without abnormal findings: Secondary | ICD-10-CM | POA: Diagnosis not present

## 2024-11-18 DIAGNOSIS — E782 Mixed hyperlipidemia: Secondary | ICD-10-CM | POA: Diagnosis not present

## 2024-11-26 ENCOUNTER — Other Ambulatory Visit: Payer: Self-pay | Admitting: Hematology

## 2024-11-26 DIAGNOSIS — C9002 Multiple myeloma in relapse: Secondary | ICD-10-CM

## 2024-11-26 DIAGNOSIS — Z7189 Other specified counseling: Secondary | ICD-10-CM

## 2024-11-27 ENCOUNTER — Inpatient Hospital Stay (HOSPITAL_BASED_OUTPATIENT_CLINIC_OR_DEPARTMENT_OTHER): Admitting: Hematology

## 2024-11-27 ENCOUNTER — Inpatient Hospital Stay: Attending: Hematology

## 2024-11-27 ENCOUNTER — Encounter: Payer: Self-pay | Admitting: Hematology

## 2024-11-27 ENCOUNTER — Inpatient Hospital Stay

## 2024-11-27 ENCOUNTER — Other Ambulatory Visit: Payer: Self-pay

## 2024-11-27 VITALS — BP 124/69 | HR 45 | Temp 97.5°F | Resp 0 | Wt 170.2 lb

## 2024-11-27 VITALS — HR 50 | Resp 18

## 2024-11-27 DIAGNOSIS — C9002 Multiple myeloma in relapse: Secondary | ICD-10-CM | POA: Diagnosis not present

## 2024-11-27 DIAGNOSIS — Z85528 Personal history of other malignant neoplasm of kidney: Secondary | ICD-10-CM | POA: Insufficient documentation

## 2024-11-27 DIAGNOSIS — Z5112 Encounter for antineoplastic immunotherapy: Secondary | ICD-10-CM | POA: Insufficient documentation

## 2024-11-27 DIAGNOSIS — R911 Solitary pulmonary nodule: Secondary | ICD-10-CM

## 2024-11-27 DIAGNOSIS — N183 Chronic kidney disease, stage 3 unspecified: Secondary | ICD-10-CM | POA: Insufficient documentation

## 2024-11-27 DIAGNOSIS — G4700 Insomnia, unspecified: Secondary | ICD-10-CM | POA: Insufficient documentation

## 2024-11-27 DIAGNOSIS — Z7982 Long term (current) use of aspirin: Secondary | ICD-10-CM | POA: Diagnosis not present

## 2024-11-27 DIAGNOSIS — I129 Hypertensive chronic kidney disease with stage 1 through stage 4 chronic kidney disease, or unspecified chronic kidney disease: Secondary | ICD-10-CM | POA: Diagnosis not present

## 2024-11-27 DIAGNOSIS — Z5111 Encounter for antineoplastic chemotherapy: Secondary | ICD-10-CM

## 2024-11-27 DIAGNOSIS — M1990 Unspecified osteoarthritis, unspecified site: Secondary | ICD-10-CM | POA: Diagnosis not present

## 2024-11-27 DIAGNOSIS — Z7189 Other specified counseling: Secondary | ICD-10-CM

## 2024-11-27 DIAGNOSIS — H547 Unspecified visual loss: Secondary | ICD-10-CM | POA: Diagnosis not present

## 2024-11-27 DIAGNOSIS — Z79899 Other long term (current) drug therapy: Secondary | ICD-10-CM | POA: Diagnosis not present

## 2024-11-27 DIAGNOSIS — Z79624 Long term (current) use of inhibitors of nucleotide synthesis: Secondary | ICD-10-CM | POA: Diagnosis not present

## 2024-11-27 LAB — CBC WITH DIFFERENTIAL (CANCER CENTER ONLY)
Abs Immature Granulocytes: 0.01 K/uL (ref 0.00–0.07)
Basophils Absolute: 0.1 K/uL (ref 0.0–0.1)
Basophils Relative: 2 %
Eosinophils Absolute: 0.1 K/uL (ref 0.0–0.5)
Eosinophils Relative: 4 %
HCT: 36.5 % — ABNORMAL LOW (ref 39.0–52.0)
Hemoglobin: 12 g/dL — ABNORMAL LOW (ref 13.0–17.0)
Immature Granulocytes: 0 %
Lymphocytes Relative: 45 %
Lymphs Abs: 1.6 K/uL (ref 0.7–4.0)
MCH: 29.5 pg (ref 26.0–34.0)
MCHC: 32.9 g/dL (ref 30.0–36.0)
MCV: 89.7 fL (ref 80.0–100.0)
Monocytes Absolute: 0.2 K/uL (ref 0.1–1.0)
Monocytes Relative: 6 %
Neutro Abs: 1.5 K/uL — ABNORMAL LOW (ref 1.7–7.7)
Neutrophils Relative %: 43 %
Platelet Count: 128 K/uL — ABNORMAL LOW (ref 150–400)
RBC: 4.07 MIL/uL — ABNORMAL LOW (ref 4.22–5.81)
RDW: 15.8 % — ABNORMAL HIGH (ref 11.5–15.5)
WBC Count: 3.5 K/uL — ABNORMAL LOW (ref 4.0–10.5)
nRBC: 0 % (ref 0.0–0.2)

## 2024-11-27 LAB — COMPREHENSIVE METABOLIC PANEL WITH GFR
ALT: 15 U/L (ref 0–44)
AST: 18 U/L (ref 15–41)
Albumin: 4.2 g/dL (ref 3.5–5.0)
Alkaline Phosphatase: 48 U/L (ref 38–126)
Anion gap: 9 (ref 5–15)
BUN: 16 mg/dL (ref 8–23)
CO2: 27 mmol/L (ref 22–32)
Calcium: 8.5 mg/dL — ABNORMAL LOW (ref 8.9–10.3)
Chloride: 105 mmol/L (ref 98–111)
Creatinine, Ser: 1.77 mg/dL — ABNORMAL HIGH (ref 0.61–1.24)
GFR, Estimated: 39 mL/min — ABNORMAL LOW (ref >60)
Glucose, Bld: 96 mg/dL (ref 70–99)
Potassium: 3.9 mmol/L (ref 3.5–5.1)
Sodium: 141 mmol/L (ref 135–145)
Total Bilirubin: 0.5 mg/dL (ref 0.0–1.2)
Total Protein: 6.6 g/dL (ref 6.5–8.1)

## 2024-11-27 MED ORDER — DIPHENHYDRAMINE HCL 25 MG PO CAPS
50.0000 mg | ORAL_CAPSULE | Freq: Once | ORAL | Status: AC
  Administered 2024-11-27: 50 mg via ORAL
  Filled 2024-11-27: qty 2

## 2024-11-27 MED ORDER — FAMOTIDINE 20 MG PO TABS
20.0000 mg | ORAL_TABLET | Freq: Once | ORAL | Status: AC
  Administered 2024-11-27: 20 mg via ORAL
  Filled 2024-11-27: qty 1

## 2024-11-27 MED ORDER — ACETAMINOPHEN 325 MG PO TABS
650.0000 mg | ORAL_TABLET | Freq: Once | ORAL | Status: AC
  Administered 2024-11-27: 650 mg via ORAL
  Filled 2024-11-27: qty 2

## 2024-11-27 MED ORDER — DENOSUMAB 120 MG/1.7ML ~~LOC~~ SOLN
120.0000 mg | Freq: Once | SUBCUTANEOUS | Status: AC
  Administered 2024-11-27: 120 mg via SUBCUTANEOUS
  Filled 2024-11-27: qty 1.7

## 2024-11-27 MED ORDER — DARATUMUMAB-HYALURONIDASE-FIHJ 1800-30000 MG-UT/15ML ~~LOC~~ SOLN
1800.0000 mg | Freq: Once | SUBCUTANEOUS | Status: AC
  Administered 2024-11-27: 1800 mg via SUBCUTANEOUS
  Filled 2024-11-27: qty 15

## 2024-11-27 MED ORDER — DEXAMETHASONE 6 MG PO TABS
6.0000 mg | ORAL_TABLET | Freq: Once | ORAL | Status: AC
  Administered 2024-11-27: 6 mg via ORAL
  Filled 2024-11-27: qty 1

## 2024-11-27 NOTE — Addendum Note (Signed)
 Addended by: ONESIMO KARST on: 11/27/2024 03:15 PM   Modules accepted: Orders

## 2024-11-27 NOTE — Progress Notes (Signed)
 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   Patient was seen by me as an inpatient on 05/20/2023.  He noted that his vision may be slightly better with the steroid. Had some mild insomnia but it was not too bothersome. Has been seen by radiation oncology and is going to be set up for CT simulation and to start radiation 05/21/2023.   Did have a CT chest abdomen pelvis without contrast to evaluate for any other soft tissue metastatic disease and whole-body skeletal survey to determine other overt progression and to rule out the possibility of further source of metastatic disease.   Today, he is accompanied by two family members. He reports that he is feeling well overall. He reports that his vision has improved and returned to baseline. He denies any glares in his vision which were previously present. He is able to count fingers and notes improved sight of colors on television.    His p.o. intake is normal and he notes drinking 4 16oz bottles daily. He denies any fevers, chills, night sweats, new back pain, fatigue, posterior neck pain, or abdominal pain.   He reports some nausea with previously taking Revlimid . Patient reports that he has an upcoming dental appointment.   TREATMENT HISTORY 05/23/2016: Induction chemotherapy with bortezomib 1.3 mg/m2 weekly and dexamethasone  40 mg weekly using 28-day cycles. Plans to add lenalidomide  and Zometa  with cycle 2 (06/20/2016).  07/18/2016: Added lenalidomide  10 mg days 1-21 out of 28 days, delayed starting due to  insurance. Started lenalidomide  on 07/18/2016 with cycle 3. Achieved a PR after 4 cycles of treatment (2 cycles containing lenalidomide ). 11/14/2016: Treatment changed to carfilzomib  (D1/8/15) and dexamethasone  to achieve a deeper response after having plateaued on prior regimen.  01/24/2017: Bone marrow biopsy showed no increased plasma cells but was positive for Congo red stain. Diagnosis of amyloid was made (AL pending confirmation with mass spect). 02/01/2017: Cyclophosphamide added to existing regimen on D1/8/15 to deepen response and due to amyloid deposits found on bone marrow biopsy. 06/19/2017: Transitioned to D1/15 dosing in an attempt to transition to maintenance. His M-spike was 0.2 g/dL, consistent with a PR but close to VGPR. 10/02/2017: Discontinued dexamethasone  but continued on cyclophosphamide and carfilzomib  after showing persistent stable disease with M-spike 0.29 g/dL. 10/2017: Mr. Lariccia held all therapy starting in 10/2017 since he believed it was not needed. A long discussion was had with him during visit in 04/2018 regarding concern for chemical relapse but he refused to get labs drawn and did not show interest in resuming some type of therapy.  07/16/2018 - 06/11/2019: He achieved an unconfirmed remission (VGPR) when re-assessed and maintained no detectable disease by serology since while on surveillance. 10/07/2019: Routine serology revealed a new M-spike of 0.56 g/dL with IFIX IgG kappa. Patient maintained on surveillance with repeat labs planned for 11/06/2019. 11/06/2019: Confirmed chemical relapse with 2 consecutive positive m-spikes (0.56 and 0.47). A repeat serology from 12/16/19 showed an M-spike of 0.86 and restaging was was performed with bone marrow biopsy and PETCT scan in 12/2019, which showed 3% kappa restricted plasma cells and no evidence of  new/active lesions. Patient remained on active surveillance per his request.  05/20/2023: Underwent CT head due to vision  disturbance to the left eye x 3 weeks. Findings included large mass at the skull base, eroding and expanding the clivus,also involving the sphenoid bone and the petrous portions of both temporal bones. The mass broadly abuts both optic nerves between the chiasm and orbital apices. Mass also broadly involves both cavernous sinuses. 05/20/2023: MRI brain: 7.8 x 5.6 x 3.3 cm enhancing mass lesion at the skull base extending along the petrous ridge bilaterally, greater on the right. 05/21/2023: CT CAP: Numerous lytic osseous lesions, consistent with patient's history of multiple myeloma. A large lucent lesion of the T6 vertebral body involves the posterior cortex, extension into the spinal canal can not be excluded 05/22/2023-06/02/2023: Received palliative radiation to base of skull and medial right clavicle. 06/13/2023: Started Cycle 1, Day 1 of Daratumumab /Revlimid /Dex.  06/19/2023: Recommend to hold Revlimid  as patient cannot take aspirin  therapy due to recent GI bleed. 09/19/2023: Resumed Revlimid  10 mg.     CURRENT THERAPY: Daratumumab  + Revlimid  maintenance Yeah yeah diarrhea dictated SUMMARY OF ONCOLOGIC HISTORY: Oncology History  Multiple myeloma in relapse (HCC)  05/20/2023 Initial Diagnosis   Multiple myeloma in relapse (HCC)   06/13/2023 -  Chemotherapy   Patient is on Treatment Plan : MYELOMA RELAPSED REFRACTORY Daratumumab  SQ + Lenalidomide  + Dexamethasone  (DaraRd) q28d       INTERVAL HISTORY:  Marcus Hatfield is a 78 y.o. male who is here today for continued evaluation and management of  IgG kappa mutliple myeloma with base of skull plasmacytoma. He is accompanied by his wife.  he was last seen by me on 10/02/2024; at the time he did not have any concerns and was doing well.   Today, he is doing well overall. He is up to date on vaccines.  He was treated for possible pneumonia a few months ago in August 2025.  Chest x-ray showed heterogenous opacity of the left midlung zone which might  represent pneumonia however a CT chest was recommended in 4 to 6 weeks for resolution.  Patient has not had any followup imaging.  Denies infections issues, fevers, chills, leg swelling, night sweats, large weight loss, bone pains, abdominal pain, issues passing urine, headaches, or changes in vision.    REVIEW OF SYSTEMS:   10 Point review of systems of done and is negative except as noted above.  MEDICAL HISTORY Past Medical History:  Diagnosis Date   Anemia    Arthritis    CKD (chronic kidney disease) stage 3, GFR 30-59 ml/min (HCC) 20015   Creat 1.9   Hypertension    Multiple myeloma (HCC) 2016   [facility]   Prostate disorder 02/2017    SURGICAL HISTORY Past Surgical History:  Procedure Laterality Date   BIOPSY  06/06/2023   Procedure: BIOPSY;  Surgeon: Federico Rosario BROCKS, MD;  Location: [facility] ENDOSCOPY;  Service: Gastroenterology;;   ESOPHAGOGASTRODUODENOSCOPY (EGD) WITH PROPOFOL  N/A 06/06/2023   Procedure: ESOPHAGOGASTRODUODENOSCOPY (EGD) WITH PROPOFOL ;  Surgeon: Federico Rosario BROCKS, MD;  Location: [facility] ENDOSCOPY;  Service: Gastroenterology;  Laterality: N/A;   EYE SURGERY Bilateral 01/30/2018   KNEE SURGERY     over ten years ago   NEPHRECTOMY Right 03/2015   ROBOTIC ASSITED PARTIAL NEPHRECTOMY Left 07/07/2021   Procedure: XI ROBOTIC ASSITED PARTIAL NEPHRECTOMY;  Surgeon: Alvaro Hummer, MD;  Location: [facility] ORS;  Service: Urology;  Laterality: Left;  5 HRS   XI ROBOTIC ASSISTED SIMPLE PROSTATECTOMY N/A 07/07/2021  Procedure: XI ROBOTIC ASSISTED SIMPLE PROSTATECTOMY;  Surgeon: Alvaro Hummer, MD;  Location: [facility];  Service: Urology;  Laterality: N/A;    SOCIAL HISTORY Social History   Tobacco Use   Smoking status: Never   Smokeless tobacco: Never  Vaping Use   Vaping status: Never Used  Substance Use Topics   Alcohol  use: No   Drug use: No    Social History   Social History Narrative   Not on file    SOCIAL DRIVERS OF HEALTH SDOH Screenings   Food Insecurity:  Patient Declined (06/05/2023)  Housing: Patient Declined (06/05/2023)  Transportation Needs: Patient Declined (06/05/2023)  Utilities: Patient Declined (06/05/2023)  Depression (PHQ2-9): Low Risk  (10/02/2024)  Tobacco Use: Low Risk  (08/01/2024)     FAMILY HISTORY Family History  Problem Relation Age of Onset   Diabetes Father    Hypertension Brother      ALLERGIES: is allergic to penicillins, latex, and tape.  MEDICATIONS  Current Outpatient Medications  Medication Sig Dispense Refill   acyclovir  (ZOVIRAX ) 400 MG tablet TAKE 1 TABLET BY MOUTH TWICE A DAY 180 tablet 3   amLODipine  (NORVASC ) 10 MG tablet Take 0.5 tablets (5 mg total) by mouth daily. (Patient taking differently: Take 10 mg by mouth daily.)     aspirin  81 MG chewable tablet Chew 81 mg by mouth daily.     benzonatate  (TESSALON ) 100 MG capsule Take 1 capsule by mouth every 8 (eight) hours for cough. 21 capsule 0   CALCIUM  PO Take by mouth.     carvedilol  (COREG ) 6.25 MG tablet Take 1 tablet (6.25 mg total) by mouth 2 (two) times daily with a meal.     diclofenac Sodium (VOLTAREN) 1 % GEL Apply 2 g topically daily as needed (for pain).     docusate sodium  (COLACE) 100 MG capsule Take 1 capsule (100 mg total) by mouth daily as needed for mild constipation.     ferrous sulfate  325 (65 FE) MG tablet Take 1 tablet (325 mg total) by mouth 2 (two) times daily as needed (iron).     finasteride  (PROSCAR ) 5 MG tablet Take 5 mg by mouth daily.     fluticasone  (FLONASE ) 50 MCG/ACT nasal spray Place 2 sprays into both nostrils daily as needed for allergies.     furosemide  (LASIX ) 20 MG tablet Take 0.5 tablets (10 mg total) by mouth every other day. 30 tablet    lactulose  (CHRONULAC ) 10 GM/15ML solution Take 10 g by mouth daily as needed for constipation.  5   lenalidomide  (REVLIMID ) 10 MG capsule TAKE 1 CAPSULE BY MOUTH 1 TIME A DAY FOR 21 DAYS ON THEN 7 DAYS OFF 21 capsule 0   levofloxacin  (LEVAQUIN ) 750 MG tablet Take 1 tablet (750  mg total) by mouth daily. 7 tablet 0   ondansetron  (ZOFRAN ) 8 MG tablet Take 1 tablet (8 mg total) by mouth every 8 (eight) hours as needed for nausea or vomiting. 30 tablet 1   oxyCODONE -acetaminophen  (PERCOCET/ROXICET) 5-325 MG tablet Take 0.5-1 tablets by mouth every 6 (six) hours as needed for severe pain (pain score 7-10). 20 tablet 0   pantoprazole  (PROTONIX ) 40 MG tablet Take 1 tablet (40 mg total) by mouth 2 (two) times daily. Take bid x 10 weeks, followed by daily thereafter 90 tablet 3   prochlorperazine  (COMPAZINE ) 10 MG tablet Take 1 tablet (10 mg total) by mouth every 6 (six) hours as needed for nausea or vomiting. 30 tablet 1   tamsulosin  (FLOMAX ) 0.4 MG  CAPS capsule Take 1 capsule (0.4 mg total) by mouth daily after supper. 30 capsule 1   telmisartan (MICARDIS) 80 MG tablet Take 80 mg by mouth daily.     No current facility-administered medications for this visit.    PHYSICAL EXAMINATION: ECOG PERFORMANCE STATUS: 0 - Asymptomatic VITALS: Vitals:   11/27/24 1057  BP: 124/69  Pulse: (!) 45  Resp: (!) 0  Temp: (!) 97.5 F (36.4 C)  SpO2: 100%   Filed Weights   11/27/24 1057  Weight: 170 lb 3.2 oz (77.2 kg)   Body mass index is 24.42 kg/m.  GENERAL: alert, in no acute distress and comfortable SKIN: no acute rashes, no significant lesions EYES: conjunctiva are pink and non-injected, sclera anicteric OROPHARYNX: MMM, no exudates, no oropharyngeal erythema or ulceration NECK: supple, no JVD LYMPH:  no palpable lymphadenopathy in the cervical, axillary or inguinal regions LUNGS: clear to auscultation b/l with normal respiratory effort HEART: regular rate & rhythm ABDOMEN:  normoactive bowel sounds , non tender, not distended, no hepatosplenomegaly Extremity: no pedal edema PSYCH: alert & oriented x 3 with fluent speech NEURO: no focal motor/sensory deficits  LABORATORY DATA:   I have reviewed the data as listed     Latest Ref Rng & Units 11/27/2024   10:28 AM  10/30/2024   10:41 AM 10/02/2024   10:30 AM  CBC EXTENDED  WBC 4.0 - 10.5 K/uL 3.5  3.6  4.3   RBC 4.22 - 5.81 MIL/uL 4.07  4.14  3.94   Hemoglobin 13.0 - 17.0 g/dL 87.9  87.4  88.1   HCT 39.0 - 52.0 % 36.5  37.8  35.6   Platelets 150 - 400 K/uL 128  116  145   NEUT# 1.7 - 7.7 K/uL 1.5  1.3  1.7   Lymph# 0.7 - 4.0 K/uL 1.6  1.8  1.9         Latest Ref Rng & Units 11/27/2024   10:28 AM 10/30/2024   10:41 AM 10/02/2024   10:30 AM  CMP  Glucose 70 - 99 mg/dL 96  55  77   BUN 8 - 23 mg/dL 16  16  15    Creatinine 0.61 - 1.24 mg/dL 8.22  8.38  8.22   Sodium 135 - 145 mmol/L 141  141  140   Potassium 3.5 - 5.1 mmol/L 3.9  3.6  3.9   Chloride 98 - 111 mmol/L 105  107  108   CO2 22 - 32 mmol/L 27  29  28    Calcium  8.9 - 10.3 mg/dL 8.5  8.8  8.5   Total Protein 6.5 - 8.1 g/dL 6.6  6.4  6.1   Total Bilirubin 0.0 - 1.2 mg/dL 0.5  0.6  0.4   Alkaline Phos 38 - 126 U/L 48  42  49   AST 15 - 41 U/L 18  13  13    ALT 0 - 44 U/L 15  10  11     Multiple Myeloma Panel  Component     Latest Ref Rng 10/30/2024  IgG (Immunoglobin G), Serum     603 - 1,613 mg/dL 106   IgA     61 - 562 mg/dL 89   IgM (Immunoglobulin M), Srm     15 - 143 mg/dL 10 (L)   Total Protein ELP     6.0 - 8.5 g/dL 5.9 (L) (C)  Albumin  SerPl Elph-Mcnc     2.9 - 4.4 g/dL 3.5 (C)  Alpha 1  0.0 - 0.4 g/dL 0.2 (C)  Alpha2 Glob SerPl Elph-Mcnc     0.4 - 1.0 g/dL 0.6 (C)  B-Globulin SerPl Elph-Mcnc     0.7 - 1.3 g/dL 0.8 (C)  Gamma Glob SerPl Elph-Mcnc     0.4 - 1.8 g/dL 0.7 (C)  M Protein SerPl Elph-Mcnc     Not Observed g/dL 0.2 (H) (C)  Globulin, Total     2.2 - 3.9 g/dL 2.4 (C)  Albumin /Glob SerPl     0.7 - 1.7  1.5 (C)  IFE 1 Comment ! (C)  Please Note (HCV): Comment (C)    Legend: (L) Low (H) High ! Abnormal (C) Corrected  Kappa/lambda Light Chains Component     Latest Ref Rng 10/30/2024  Kappa free light chain     3.3 - 19.4 mg/L 20.2 (H)   Lambda free light chains     5.7 - 26.3 mg/L 19.9    Kappa, lambda light chain ratio     0.26 - 1.65  1.02     Legend: (H) High  MULTIPLE MYELOMA PANEL & KAPPA/LAMDA LIGHT CHAINS 03/2024 - 08/2024    RADIOGRAPHIC STUDIES: I have personally reviewed the radiological images as listed and agreed with the findings in the report. No results found.  ASSESSMENT & PLAN:  78 y.o. male with #1) IgG kappa multiple myeloma:  -See oncologic history as above.  -Current treatment includes maintenance therapy with daratumumab /Revlimid /Dexamethasone    #2) H/O GI bleeding: --Admitted from 06/05/2023-06/06/2023.EGD showed nonbleeding duodenal ulcers. Received 2 units of PRBC. GI recommended PPI twice daily x 10 weeks and then daily thereafter.   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 --Vision has improved after completion of radiation therapy.  --Monitor for now.    #5) History of multifocal renal cell carcinoma --Underwent right nephrectomy on 02/03/2016.  He received cryotherapy to his left renal mass in April 2017. --Underwent laparoscopic left partial nephrectomy and simple prostatectomy with Dr. Alvaro on 07/07/2021. 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 Tumor is limited to the kidney (pT1a).    PLAN: - Discussed lab results on 11/27/2024 in detail with patient: CBC shows stable hemoglobin of 12 with a WBC count of 3.5k and platelets are 128k CMP within normal limits other than stable chronic kidney disease with a creatinine of 1.77 calcium  of 8.5. - Multiple Myeloma and Kappa/Lambda light chains are pending are pending from today. Multiple myeloma panel from 10/30/2024 showed M spike of 0.2 g/dL of IgG kappa monoclonal protein Patient has no symptoms or lab findings suggestive of progression of his  multiple myeloma -Continue daratumumab  Faspro every 4 weeks per integrated scheduling -For maintenance Revlimid  p.o. Continue same supportive medications during acyclovir  p.o. daily - Continue Xgeva  every 4 weeks  FOLLOW-UP   CT chest WO contrast in 4 weeks Continue daratumumab  Faspro every 4 weeks per integrated scheduling For maintenance Revlimid  p.o. Continue Xgeva  every 4 weeks MD visit in 8 weeks    The total time spent in the appointment was 30 minutes* .  All of the patient's questions were answered and the patient knows to call the clinic with any problems, questions, or concerns.  Emaline Saran MD MS AAHIVMS [facility] Hematology/Oncology Physician [facility]  *Total Encounter Time as defined by the Centers for Medicare and Medicaid  Services includes, in addition to the face-to-face time of a patient visit (documented in the note above) non-face-to-face time: obtaining and reviewing outside history, ordering and reviewing medications, tests or procedures, care coordination (communications with other health care professionals or caregivers) and documentation in the medical record.  I, Marijo Sharps, acting as a neurosurgeon for Emaline Saran, MD.,have documented all relevant documentation on the behalf of Emaline Saran, MD,as directed by  Emaline Saran, MD while in the presence of Emaline Saran, MD.  I have reviewed the above documentation for accuracy and completeness, and I agree with the above.  Riana Tessmer, MD

## 2024-11-27 NOTE — Progress Notes (Signed)
 Ok to proceed with Xgeva  with Ca=8.5 per Dr. Onesimo, and ok to proceed with expired auth per auth team.  Jaylena Holloway, PharmD, MBA

## 2024-11-27 NOTE — Patient Instructions (Signed)
 [facility] - A DEPT OF [facility]  Discharge Instructions: Thank you for choosing [facility] to provide your oncology and hematology care.   If you have a lab appointment with the Cancer Center, please go directly to the Cancer Center and check in at the registration area.   Wear comfortable clothing and clothing appropriate for easy access to any Portacath or PICC line.   We strive to give you quality time with your provider. You may need to reschedule your appointment if you arrive late (15 or more minutes).  Arriving late affects you and other patients whose appointments are after yours.  Also, if you miss three or more appointments without notifying the office, you may be dismissed from the clinic at the provider's discretion.      For prescription refill requests, have your pharmacy contact our office and allow 72 hours for refills to be completed.    Today you received the following chemotherapy and/or immunotherapy agents: daratumumab-hyaluronidase-fihj (DARZALEX FASPRO       To help prevent nausea and vomiting after your treatment, we encourage you to take your nausea medication as directed.  BELOW ARE SYMPTOMS THAT SHOULD BE REPORTED IMMEDIATELY: *FEVER GREATER THAN 100.4 F (38 C) OR HIGHER *CHILLS OR SWEATING *NAUSEA AND VOMITING THAT IS NOT CONTROLLED WITH YOUR NAUSEA MEDICATION *UNUSUAL SHORTNESS OF BREATH *UNUSUAL BRUISING OR BLEEDING *URINARY PROBLEMS (pain or burning when urinating, or frequent urination) *BOWEL PROBLEMS (unusual diarrhea, constipation, pain near the anus) TENDERNESS IN MOUTH AND THROAT WITH OR WITHOUT PRESENCE OF ULCERS (sore throat, sores in mouth, or a toothache) UNUSUAL RASH, SWELLING OR PAIN  UNUSUAL VAGINAL DISCHARGE OR ITCHING   Items with * indicate a potential emergency and should be followed up as soon as possible or go to the Emergency Department if any problems should occur.  Please show the  CHEMOTHERAPY ALERT CARD or IMMUNOTHERAPY ALERT CARD at check-in to the Emergency Department and triage nurse.  Should you have questions after your visit or need to cancel or reschedule your appointment, please contact [facility] - A DEPT OF [facility]  Dept: [phone]  and follow the prompts.  Office hours are 8:00 a.m. to 4:30 p.m. Monday - Friday. Please note that voicemails left after 4:00 p.m. may not be returned until the following business day.  We are closed weekends and major holidays. You have access to a nurse at all times for urgent questions. Please call the main number to the clinic Dept: [phone] and follow the prompts.   For any non-urgent questions, you may also contact your provider using MyChart. We now offer e-Visits for anyone 63 and older to request care online for non-urgent symptoms. For details visit [email or web address].   Also download the MyChart app! Go to the app store, search "MyChart", open the app, select [facility], and log in with your MyChart username and password.

## 2024-11-28 LAB — KAPPA/LAMBDA LIGHT CHAINS
Kappa free light chain: 19.8 mg/L — ABNORMAL HIGH (ref 3.3–19.4)
Kappa, lambda light chain ratio: 1.13 (ref 0.26–1.65)
Lambda free light chains: 17.5 mg/L (ref 5.7–26.3)

## 2024-11-29 LAB — MULTIPLE MYELOMA PANEL, SERUM
Albumin SerPl Elph-Mcnc: 3.7 g/dL (ref 2.9–4.4)
Albumin/Glob SerPl: 1.6 (ref 0.7–1.7)
Alpha 1: 0.2 g/dL (ref 0.0–0.4)
Alpha2 Glob SerPl Elph-Mcnc: 0.7 g/dL (ref 0.4–1.0)
B-Globulin SerPl Elph-Mcnc: 0.8 g/dL (ref 0.7–1.3)
Gamma Glob SerPl Elph-Mcnc: 0.7 g/dL (ref 0.4–1.8)
Globulin, Total: 2.4 g/dL (ref 2.2–3.9)
IgA: 84 mg/dL (ref 61–437)
IgG (Immunoglobin G), Serum: 824 mg/dL (ref 603–1613)
IgM (Immunoglobulin M), Srm: 7 mg/dL — ABNORMAL LOW (ref 15–143)
Total Protein ELP: 6.1 g/dL (ref 6.0–8.5)

## 2024-12-09 ENCOUNTER — Other Ambulatory Visit: Payer: Self-pay | Admitting: Hematology

## 2024-12-09 DIAGNOSIS — C9002 Multiple myeloma in relapse: Secondary | ICD-10-CM

## 2024-12-24 ENCOUNTER — Inpatient Hospital Stay: Admitting: Hematology

## 2024-12-24 ENCOUNTER — Inpatient Hospital Stay

## 2024-12-24 ENCOUNTER — Inpatient Hospital Stay: Attending: Hematology

## 2024-12-24 VITALS — BP 122/68 | HR 53 | Temp 97.9°F | Resp 18 | Wt 173.5 lb

## 2024-12-24 DIAGNOSIS — Z5112 Encounter for antineoplastic immunotherapy: Secondary | ICD-10-CM | POA: Insufficient documentation

## 2024-12-24 DIAGNOSIS — Z7189 Other specified counseling: Secondary | ICD-10-CM

## 2024-12-24 DIAGNOSIS — C9002 Multiple myeloma in relapse: Secondary | ICD-10-CM | POA: Insufficient documentation

## 2024-12-24 LAB — COMPREHENSIVE METABOLIC PANEL WITH GFR
ALT: 13 U/L (ref 0–44)
AST: 15 U/L (ref 15–41)
Albumin: 3.9 g/dL (ref 3.5–5.0)
Alkaline Phosphatase: 42 U/L (ref 38–126)
Anion gap: 7 (ref 5–15)
BUN: 14 mg/dL (ref 8–23)
CO2: 25 mmol/L (ref 22–32)
Calcium: 8 mg/dL — ABNORMAL LOW (ref 8.9–10.3)
Chloride: 107 mmol/L (ref 98–111)
Creatinine, Ser: 1.54 mg/dL — ABNORMAL HIGH (ref 0.61–1.24)
GFR, Estimated: 46 mL/min — ABNORMAL LOW
Glucose, Bld: 58 mg/dL — ABNORMAL LOW (ref 70–99)
Potassium: 3.5 mmol/L (ref 3.5–5.1)
Sodium: 139 mmol/L (ref 135–145)
Total Bilirubin: 0.4 mg/dL (ref 0.0–1.2)
Total Protein: 6 g/dL — ABNORMAL LOW (ref 6.5–8.1)

## 2024-12-24 LAB — CBC WITH DIFFERENTIAL (CANCER CENTER ONLY)
Abs Immature Granulocytes: 0 K/uL (ref 0.00–0.07)
Basophils Absolute: 0.1 K/uL (ref 0.0–0.1)
Basophils Relative: 2 %
Eosinophils Absolute: 0.1 K/uL (ref 0.0–0.5)
Eosinophils Relative: 4 %
HCT: 34.4 % — ABNORMAL LOW (ref 39.0–52.0)
Hemoglobin: 11.4 g/dL — ABNORMAL LOW (ref 13.0–17.0)
Immature Granulocytes: 0 %
Lymphocytes Relative: 50 %
Lymphs Abs: 2 K/uL (ref 0.7–4.0)
MCH: 29.9 pg (ref 26.0–34.0)
MCHC: 33.1 g/dL (ref 30.0–36.0)
MCV: 90.3 fL (ref 80.0–100.0)
Monocytes Absolute: 0.3 K/uL (ref 0.1–1.0)
Monocytes Relative: 8 %
Neutro Abs: 1.4 K/uL — ABNORMAL LOW (ref 1.7–7.7)
Neutrophils Relative %: 36 %
Platelet Count: 113 K/uL — ABNORMAL LOW (ref 150–400)
RBC: 3.81 MIL/uL — ABNORMAL LOW (ref 4.22–5.81)
RDW: 15.9 % — ABNORMAL HIGH (ref 11.5–15.5)
WBC Count: 3.9 K/uL — ABNORMAL LOW (ref 4.0–10.5)
nRBC: 0 % (ref 0.0–0.2)

## 2024-12-24 MED ORDER — DENOSUMAB 120 MG/1.7ML ~~LOC~~ SOLN
120.0000 mg | Freq: Once | SUBCUTANEOUS | Status: AC
  Administered 2024-12-24: 120 mg via SUBCUTANEOUS
  Filled 2024-12-24: qty 1.7

## 2024-12-24 MED ORDER — DIPHENHYDRAMINE HCL 25 MG PO CAPS
50.0000 mg | ORAL_CAPSULE | Freq: Once | ORAL | Status: AC
  Administered 2024-12-24: 50 mg via ORAL
  Filled 2024-12-24: qty 2

## 2024-12-24 MED ORDER — DARATUMUMAB-HYALURONIDASE-FIHJ 1800-30000 MG-UT/15ML ~~LOC~~ SOLN
1800.0000 mg | Freq: Once | SUBCUTANEOUS | Status: AC
  Administered 2024-12-24: 1800 mg via SUBCUTANEOUS
  Filled 2024-12-24: qty 15

## 2024-12-24 MED ORDER — DEXAMETHASONE 6 MG PO TABS
6.0000 mg | ORAL_TABLET | Freq: Once | ORAL | Status: AC
  Administered 2024-12-24: 6 mg via ORAL
  Filled 2024-12-24: qty 1

## 2024-12-24 MED ORDER — ACETAMINOPHEN 325 MG PO TABS
650.0000 mg | ORAL_TABLET | Freq: Once | ORAL | Status: AC
  Administered 2024-12-24: 650 mg via ORAL
  Filled 2024-12-24: qty 2

## 2024-12-24 MED ORDER — FAMOTIDINE 20 MG PO TABS
20.0000 mg | ORAL_TABLET | Freq: Once | ORAL | Status: AC
  Administered 2024-12-24: 20 mg via ORAL
  Filled 2024-12-24: qty 1

## 2024-12-24 NOTE — Patient Instructions (Signed)
 [facility] - A DEPT OF [facility]  Discharge Instructions: Thank you for choosing [facility] to provide your oncology and hematology care.   If you have a lab appointment with the Cancer Center, please go directly to the Cancer Center and check in at the registration area.   Wear comfortable clothing and clothing appropriate for easy access to any Portacath or PICC line.   We strive to give you quality time with your provider. You may need to reschedule your appointment if you arrive late (15 or more minutes).  Arriving late affects you and other patients whose appointments are after yours.  Also, if you miss three or more appointments without notifying the office, you may be dismissed from the clinic at the provider's discretion.      For prescription refill requests, have your pharmacy contact our office and allow 72 hours for refills to be completed.    Today you received the following chemotherapy and/or immunotherapy agents: daratumumab-hyaluronidase-fihj (DARZALEX FASPRO       To help prevent nausea and vomiting after your treatment, we encourage you to take your nausea medication as directed.  BELOW ARE SYMPTOMS THAT SHOULD BE REPORTED IMMEDIATELY: *FEVER GREATER THAN 100.4 F (38 C) OR HIGHER *CHILLS OR SWEATING *NAUSEA AND VOMITING THAT IS NOT CONTROLLED WITH YOUR NAUSEA MEDICATION *UNUSUAL SHORTNESS OF BREATH *UNUSUAL BRUISING OR BLEEDING *URINARY PROBLEMS (pain or burning when urinating, or frequent urination) *BOWEL PROBLEMS (unusual diarrhea, constipation, pain near the anus) TENDERNESS IN MOUTH AND THROAT WITH OR WITHOUT PRESENCE OF ULCERS (sore throat, sores in mouth, or a toothache) UNUSUAL RASH, SWELLING OR PAIN  UNUSUAL VAGINAL DISCHARGE OR ITCHING   Items with * indicate a potential emergency and should be followed up as soon as possible or go to the Emergency Department if any problems should occur.  Please show the  CHEMOTHERAPY ALERT CARD or IMMUNOTHERAPY ALERT CARD at check-in to the Emergency Department and triage nurse.  Should you have questions after your visit or need to cancel or reschedule your appointment, please contact [facility] - A DEPT OF [facility]  Dept: [phone]  and follow the prompts.  Office hours are 8:00 a.m. to 4:30 p.m. Monday - Friday. Please note that voicemails left after 4:00 p.m. may not be returned until the following business day.  We are closed weekends and major holidays. You have access to a nurse at all times for urgent questions. Please call the main number to the clinic Dept: [phone] and follow the prompts.   For any non-urgent questions, you may also contact your provider using MyChart. We now offer e-Visits for anyone 63 and older to request care online for non-urgent symptoms. For details visit [email or web address].   Also download the MyChart app! Go to the app store, search "MyChart", open the app, select [facility], and log in with your MyChart username and password.

## 2024-12-25 LAB — KAPPA/LAMBDA LIGHT CHAINS
Kappa free light chain: 17.8 mg/L (ref 3.3–19.4)
Kappa, lambda light chain ratio: 0.97 (ref 0.26–1.65)
Lambda free light chains: 18.4 mg/L (ref 5.7–26.3)

## 2024-12-26 LAB — MULTIPLE MYELOMA PANEL, SERUM
Albumin SerPl Elph-Mcnc: 3.3 g/dL (ref 2.9–4.4)
Albumin/Glob SerPl: 1.4 (ref 0.7–1.7)
Alpha 1: 0.2 g/dL (ref 0.0–0.4)
Alpha2 Glob SerPl Elph-Mcnc: 0.7 g/dL (ref 0.4–1.0)
B-Globulin SerPl Elph-Mcnc: 0.8 g/dL (ref 0.7–1.3)
Gamma Glob SerPl Elph-Mcnc: 0.8 g/dL (ref 0.4–1.8)
Globulin, Total: 2.4 g/dL (ref 2.2–3.9)
IgA: 71 mg/dL (ref 61–437)
IgG (Immunoglobin G), Serum: 761 mg/dL (ref 603–1613)
IgM (Immunoglobulin M), Srm: 7 mg/dL — ABNORMAL LOW (ref 15–143)
Total Protein ELP: 5.7 g/dL — ABNORMAL LOW (ref 6.0–8.5)

## 2025-01-06 ENCOUNTER — Ambulatory Visit (HOSPITAL_COMMUNITY)
Admission: RE | Admit: 2025-01-06 | Discharge: 2025-01-06 | Disposition: A | Discharge status: Home / Self Care | Source: Ambulatory Visit | Attending: Hematology | Admitting: Hematology

## 2025-01-06 ENCOUNTER — Other Ambulatory Visit: Payer: Self-pay | Admitting: Hematology

## 2025-01-06 DIAGNOSIS — R911 Solitary pulmonary nodule: Secondary | ICD-10-CM | POA: Diagnosis present

## 2025-01-06 DIAGNOSIS — C9002 Multiple myeloma in relapse: Secondary | ICD-10-CM

## 2025-01-22 ENCOUNTER — Inpatient Hospital Stay: Attending: Hematology

## 2025-01-22 ENCOUNTER — Inpatient Hospital Stay: Admitting: Hematology

## 2025-01-22 ENCOUNTER — Inpatient Hospital Stay

## 2025-01-22 DIAGNOSIS — C9002 Multiple myeloma in relapse: Secondary | ICD-10-CM

## 2025-01-22 DIAGNOSIS — Z7189 Other specified counseling: Secondary | ICD-10-CM

## 2025-01-22 LAB — COMPREHENSIVE METABOLIC PANEL WITH GFR
ALT: 13 U/L (ref 0–44)
AST: 15 U/L (ref 15–41)
Albumin: 4 g/dL (ref 3.5–5.0)
Alkaline Phosphatase: 48 U/L (ref 38–126)
Anion gap: 8 (ref 5–15)
BUN: 15 mg/dL (ref 8–23)
CO2: 28 mmol/L (ref 22–32)
Calcium: 8.5 mg/dL — ABNORMAL LOW (ref 8.9–10.3)
Chloride: 104 mmol/L (ref 98–111)
Creatinine, Ser: 1.68 mg/dL — ABNORMAL HIGH (ref 0.61–1.24)
GFR, Estimated: 41 mL/min — ABNORMAL LOW
Glucose, Bld: 95 mg/dL (ref 70–99)
Potassium: 3.7 mmol/L (ref 3.5–5.1)
Sodium: 140 mmol/L (ref 135–145)
Total Bilirubin: 0.4 mg/dL (ref 0.0–1.2)
Total Protein: 6.3 g/dL — ABNORMAL LOW (ref 6.5–8.1)

## 2025-01-22 LAB — CBC WITH DIFFERENTIAL (CANCER CENTER ONLY)
Abs Immature Granulocytes: 0.01 10*3/uL (ref 0.00–0.07)
Basophils Absolute: 0.1 10*3/uL (ref 0.0–0.1)
Basophils Relative: 2 %
Eosinophils Absolute: 0.1 10*3/uL (ref 0.0–0.5)
Eosinophils Relative: 3 %
HCT: 36.3 % — ABNORMAL LOW (ref 39.0–52.0)
Hemoglobin: 12 g/dL — ABNORMAL LOW (ref 13.0–17.0)
Immature Granulocytes: 0 %
Lymphocytes Relative: 48 %
Lymphs Abs: 1.8 10*3/uL (ref 0.7–4.0)
MCH: 29.9 pg (ref 26.0–34.0)
MCHC: 33.1 g/dL (ref 30.0–36.0)
MCV: 90.3 fL (ref 80.0–100.0)
Monocytes Absolute: 0.3 10*3/uL (ref 0.1–1.0)
Monocytes Relative: 7 %
Neutro Abs: 1.6 10*3/uL — ABNORMAL LOW (ref 1.7–7.7)
Neutrophils Relative %: 40 %
Platelet Count: 127 10*3/uL — ABNORMAL LOW (ref 150–400)
RBC: 4.02 MIL/uL — ABNORMAL LOW (ref 4.22–5.81)
RDW: 15.3 % (ref 11.5–15.5)
WBC Count: 3.9 10*3/uL — ABNORMAL LOW (ref 4.0–10.5)
nRBC: 0 % (ref 0.0–0.2)

## 2025-01-22 MED ORDER — ACETAMINOPHEN 325 MG PO TABS
650.0000 mg | ORAL_TABLET | Freq: Once | ORAL | Status: AC
  Administered 2025-01-22: 650 mg via ORAL
  Filled 2025-01-22: qty 2

## 2025-01-22 MED ORDER — DARATUMUMAB-HYALURONIDASE-FIHJ 1800-30000 MG-UT/15ML ~~LOC~~ SOLN
1800.0000 mg | Freq: Once | SUBCUTANEOUS | Status: AC
  Administered 2025-01-22: 1800 mg via SUBCUTANEOUS
  Filled 2025-01-22: qty 15

## 2025-01-22 MED ORDER — DENOSUMAB 120 MG/1.7ML ~~LOC~~ SOLN
120.0000 mg | Freq: Once | SUBCUTANEOUS | Status: AC
  Administered 2025-01-22: 120 mg via SUBCUTANEOUS
  Filled 2025-01-22: qty 1.7

## 2025-01-22 MED ORDER — FAMOTIDINE 20 MG PO TABS
20.0000 mg | ORAL_TABLET | Freq: Once | ORAL | Status: AC
  Administered 2025-01-22: 20 mg via ORAL
  Filled 2025-01-22: qty 1

## 2025-01-22 MED ORDER — DEXAMETHASONE 6 MG PO TABS
6.0000 mg | ORAL_TABLET | Freq: Once | ORAL | Status: AC
  Administered 2025-01-22: 6 mg via ORAL
  Filled 2025-01-22: qty 1

## 2025-01-22 MED ORDER — DIPHENHYDRAMINE HCL 25 MG PO CAPS
50.0000 mg | ORAL_CAPSULE | Freq: Once | ORAL | Status: AC
  Administered 2025-01-22: 50 mg via ORAL
  Filled 2025-01-22: qty 2

## 2025-01-22 NOTE — Progress Notes (Signed)
 Okay to proceed with Xgeva  today with Calcium  8.5 per Dr. Onesimo.  Harlene Nasuti, PharmD Oncology Infusion Pharmacist 01/22/2025 2:23 PM

## 2025-01-22 NOTE — Progress Notes (Shared)
 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   Patient was seen by me as an inpatient on 05/20/2023.  He noted that his vision may be slightly better with the steroid. Had some mild insomnia but it was not too bothersome. Has been seen by radiation oncology and is going to be set up for CT simulation and to start radiation 05/21/2023.   Did have a CT chest abdomen pelvis without contrast to evaluate for any other soft tissue metastatic disease and whole-body skeletal survey to determine other overt progression and to rule out the possibility of further source of metastatic disease.   Today, he is accompanied by two family members. He reports that he is feeling well overall. He reports that his vision has improved and returned to baseline. He denies any glares in his vision which were previously present. He is able to count fingers and notes improved sight of colors on television.    His p.o. intake is normal and he notes drinking 4 16oz bottles daily. He denies any fevers, chills, night sweats, new back pain, fatigue, posterior neck pain, or abdominal pain.   He reports some nausea with previously taking Revlimid . Patient reports that he has an upcoming dental appointment.   TREATMENT HISTORY 05/23/2016: Induction chemotherapy with bortezomib 1.3 mg/m2 weekly and dexamethasone  40 mg weekly using 28-day cycles. Plans to add lenalidomide  and Zometa  with cycle 2 (06/20/2016).  07/18/2016: Added lenalidomide  10 mg days 1-21 out of 28 days, delayed starting due to  insurance. Started lenalidomide  on 07/18/2016 with cycle 3. Achieved a PR after 4 cycles of treatment (2 cycles containing lenalidomide ). 11/14/2016: Treatment changed to carfilzomib  (D1/8/15) and dexamethasone  to achieve a deeper response after having plateaued on prior regimen.  01/24/2017: Bone marrow biopsy showed no increased plasma cells but was positive for Congo red stain. Diagnosis of amyloid was made (AL pending confirmation with mass spect). 02/01/2017: Cyclophosphamide added to existing regimen on D1/8/15 to deepen response and due to amyloid deposits found on bone marrow biopsy. 06/19/2017: Transitioned to D1/15 dosing in an attempt to transition to maintenance. His M-spike was 0.2 g/dL, consistent with a PR but close to VGPR. 10/02/2017: Discontinued dexamethasone  but continued on cyclophosphamide and carfilzomib  after showing persistent stable disease with M-spike 0.29 g/dL. 10/2017: Mr. Dorton held all therapy starting in 10/2017 since he believed it was not needed. A long discussion was had with him during visit in 04/2018 regarding concern for chemical relapse but he refused to get labs drawn and did not show interest in resuming some type of therapy.  07/16/2018 - 06/11/2019: He achieved an unconfirmed remission (VGPR) when re-assessed and maintained no detectable disease by serology since while on surveillance. 10/07/2019: Routine serology revealed a new M-spike of 0.56 g/dL with IFIX IgG kappa. Patient maintained on surveillance with repeat labs planned for 11/06/2019. 11/06/2019: Confirmed chemical relapse with 2 consecutive positive m-spikes (0.56 and 0.47). A repeat serology from 12/16/19 showed an M-spike of 0.86 and restaging was was performed with bone marrow biopsy and PETCT scan in 12/2019, which showed 3% kappa restricted plasma cells and no evidence  of new/active lesions. Patient remained on active surveillance per his request.  05/20/2023: Underwent CT head due to vision  disturbance to the left eye x 3 weeks. Findings included large mass at the skull base, eroding and expanding the clivus,also involving the sphenoid bone and the petrous portions of both temporal bones. The mass broadly abuts both optic nerves between the chiasm and orbital apices. Mass also broadly involves both cavernous sinuses. 05/20/2023: MRI brain: 7.8 x 5.6 x 3.3 cm enhancing mass lesion at the skull base extending along the petrous ridge bilaterally, greater on the right. 05/21/2023: CT CAP: Numerous lytic osseous lesions, consistent with patient's history of multiple myeloma. A large lucent lesion of the T6 vertebral body involves the posterior cortex, extension into the spinal canal can not be excluded 05/22/2023-06/02/2023: Received palliative radiation to base of skull and medial right clavicle. 06/13/2023: Started Cycle 1, Day 1 of Daratumumab /Revlimid /Dex.  06/19/2023: Recommend to hold Revlimid  as patient cannot take aspirin  therapy due to recent GI bleed. 09/19/2023: Resumed Revlimid  10 mg.     SUMMARY OF ONCOLOGIC HISTORY: Oncology History  Multiple myeloma in relapse (HCC)  05/20/2023 Initial Diagnosis   Multiple myeloma in relapse (HCC)   06/13/2023 -  Chemotherapy   Patient is on Treatment Plan : MYELOMA RELAPSED REFRACTORY Daratumumab  SQ + Lenalidomide  + Dexamethasone  (DaraRd) q28d       INTERVAL HISTORY: Marcus Hatfield is a 79 y.o. 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 He is accompanied by his wife.  he was last seen by me on 11/27/2024; at the time he did not have any concerns and was doing well.   Today, he notes that he is feeling well overall. He is tolerating his treatment medications well. He is eating well.   He is up to date on his vaccinations. He does not smoke.  Denies weight change.  REVIEW OF SYSTEMS:   10 Point review of systems of done and is negative except as noted  above.  MEDICAL HISTORY Past Medical History:  Diagnosis Date   Anemia    Arthritis    CKD (chronic kidney disease) stage 3, GFR 30-59 ml/min (HCC) 20015   Creat 1.9   Hypertension    Multiple myeloma (HCC) 2016   [facility] heme onc   Prostate disorder 02/2017    SURGICAL HISTORY Past Surgical History:  Procedure Laterality Date   BIOPSY  06/06/2023   Procedure: BIOPSY;  Surgeon: Federico Rosario BROCKS, MD;  Location: [facility] ENDOSCOPY;  Service: Gastroenterology;;   ESOPHAGOGASTRODUODENOSCOPY (EGD) WITH PROPOFOL  N/A 06/06/2023   Procedure: ESOPHAGOGASTRODUODENOSCOPY (EGD) WITH PROPOFOL ;  Surgeon: Federico Rosario BROCKS, MD;  Location: [facility] ENDOSCOPY;  Service: Gastroenterology;  Laterality: N/A;   EYE SURGERY Bilateral 01/30/2018   KNEE SURGERY     over ten years ago   NEPHRECTOMY Right 03/2015   ROBOTIC ASSITED PARTIAL NEPHRECTOMY Left 07/07/2021   Procedure: XI ROBOTIC ASSITED PARTIAL NEPHRECTOMY;  Surgeon: Alvaro Hummer, MD;  Location: [facility] ORS;  Service: Urology;  Laterality: Left;  5 HRS   XI ROBOTIC ASSISTED SIMPLE PROSTATECTOMY N/A 07/07/2021   Procedure: XI ROBOTIC ASSISTED SIMPLE PROSTATECTOMY;  Surgeon: Alvaro Hummer, MD;  Location: [facility] ORS;  Service: Urology;  Laterality: N/A;    SOCIAL HISTORY Social History[1]  Social History   Social History Narrative   Not on file    SOCIAL DRIVERS OF HEALTH SDOH Screenings   Food Insecurity: Patient Declined (06/05/2023)  Housing:  Patient Declined (06/05/2023)  Transportation Needs: Patient Declined (06/05/2023)  Utilities: Patient Declined (06/05/2023)  Depression (PHQ2-9): Low Risk (12/24/2024)  Tobacco Use: Low Risk (08/01/2024)     FAMILY HISTORY Family History  Problem Relation Age of Onset   Diabetes Father    Hypertension Brother      ALLERGIES: is allergic to penicillins, latex, and tape.  MEDICATIONS  Current Outpatient Medications  Medication Sig Dispense Refill   acyclovir  (ZOVIRAX ) 400 MG tablet TAKE 1 TABLET BY MOUTH TWICE A  DAY 180 tablet 3   amLODipine  (NORVASC ) 10 MG tablet Take 0.5 tablets (5 mg total) by mouth daily. (Patient taking differently: Take 10 mg by mouth daily.)     aspirin  81 MG chewable tablet Chew 81 mg by mouth daily.     benzonatate  (TESSALON ) 100 MG capsule Take 1 capsule by mouth every 8 (eight) hours for cough. 21 capsule 0   CALCIUM  PO Take by mouth.     carvedilol  (COREG ) 6.25 MG tablet Take 1 tablet (6.25 mg total) by mouth 2 (two) times daily with a meal.     diclofenac Sodium (VOLTAREN) 1 % GEL Apply 2 g topically daily as needed (for pain).     docusate sodium  (COLACE) 100 MG capsule Take 1 capsule (100 mg total) by mouth daily as needed for mild constipation.     ferrous sulfate  325 (65 FE) MG tablet Take 1 tablet (325 mg total) by mouth 2 (two) times daily as needed (iron).     finasteride  (PROSCAR ) 5 MG tablet Take 5 mg by mouth daily.     fluticasone  (FLONASE ) 50 MCG/ACT nasal spray Place 2 sprays into both nostrils daily as needed for allergies.     furosemide  (LASIX ) 20 MG tablet Take 0.5 tablets (10 mg total) by mouth every other day. 30 tablet    lactulose  (CHRONULAC ) 10 GM/15ML solution Take 10 g by mouth daily as needed for constipation.  5   lenalidomide  (REVLIMID ) 10 MG capsule Take 1 capsule (10 mg total) by mouth daily. Take 1 capsule (10 mg total) by mouth daily for 21 days. Take 7 days off. Repeat cycle. 21 capsule 0   levofloxacin  (LEVAQUIN ) 750 MG tablet Take 1 tablet (750 mg total) by mouth daily. 7 tablet 0   ondansetron  (ZOFRAN ) 8 MG tablet Take 1 tablet (8 mg total) by mouth every 8 (eight) hours as needed for nausea or vomiting. 30 tablet 1   oxyCODONE -acetaminophen  (PERCOCET/ROXICET) 5-325 MG tablet Take 0.5-1 tablets by mouth every 6 (six) hours as needed for severe pain (pain score 7-10). 20 tablet 0   pantoprazole  (PROTONIX ) 40 MG tablet Take 1 tablet (40 mg total) by mouth 2 (two) times daily. Take bid x 10 weeks, followed by daily thereafter 90 tablet 3    prochlorperazine  (COMPAZINE ) 10 MG tablet Take 1 tablet (10 mg total) by mouth every 6 (six) hours as needed for nausea or vomiting. 30 tablet 1   tamsulosin  (FLOMAX ) 0.4 MG CAPS capsule Take 1 capsule (0.4 mg total) by mouth daily after supper. 30 capsule 1   telmisartan (MICARDIS) 80 MG tablet Take 80 mg by mouth daily.     No current facility-administered medications for this visit.    PHYSICAL EXAMINATION: ECOG PERFORMANCE STATUS: 0 - Asymptomatic VITALS: Vitals:   01/22/25 1300  BP: (!) 120/53  Pulse: (!) 53  Resp: 20  Temp: 97.7 F (36.5 C)  SpO2: 99%   Filed Weights   01/22/25 1300  Weight: 171 lb 6.4  oz (77.7 kg)   Body mass index is 24.59 kg/m.  GENERAL: alert, in no acute distress and comfortable SKIN: no acute rashes, no significant lesions EYES: conjunctiva are pink and non-injected, sclera anicteric OROPHARYNX: MMM, no exudates, no oropharyngeal erythema or ulceration NECK: supple, no JVD LYMPH:  no palpable lymphadenopathy in the cervical, axillary or inguinal regions LUNGS: clear to auscultation b/l with normal respiratory effort HEART: regular rate & rhythm ABDOMEN:  normoactive bowel sounds , non tender, not distended, no hepatosplenomegaly Extremity: no pedal edema PSYCH: alert & oriented x 3 with fluent speech NEURO: no focal motor/sensory deficits  LABORATORY DATA:   I have reviewed the data as listed     Latest Ref Rng & Units 01/22/2025   12:31 PM 12/24/2024   10:26 AM 11/27/2024   10:28 AM  CBC EXTENDED  WBC 4.0 - 10.5 K/uL 3.9  3.9  3.5   RBC 4.22 - 5.81 MIL/uL 4.02  3.81  4.07   Hemoglobin 13.0 - 17.0 g/dL 87.9  88.5  87.9   HCT 39.0 - 52.0 % 36.3  34.4  36.5   Platelets 150 - 400 K/uL 127  113  128   NEUT# 1.7 - 7.7 K/uL 1.6  1.4  1.5   Lymph# 0.7 - 4.0 K/uL 1.8  2.0  1.6         Latest Ref Rng & Units 01/22/2025   12:31 PM 12/24/2024   10:26 AM 11/27/2024   10:28 AM  CMP  Glucose 70 - 99 mg/dL 95  58  96   BUN 8 - 23 mg/dL 15  14   16    Creatinine 0.61 - 1.24 mg/dL 8.31  8.45  8.22   Sodium 135 - 145 mmol/L 140  139  141   Potassium 3.5 - 5.1 mmol/L 3.7  3.5  3.9   Chloride 98 - 111 mmol/L 104  107  105   CO2 22 - 32 mmol/L 28  25  27    Calcium  8.9 - 10.3 mg/dL 8.5  8.0  8.5   Total Protein 6.5 - 8.1 g/dL 6.3  6.0  6.6   Total Bilirubin 0.0 - 1.2 mg/dL 0.4  0.4  0.5   Alkaline Phos 38 - 126 U/L 48  42  48   AST 15 - 41 U/L 15  15  18    ALT 0 - 44 U/L 13  13  15     Multiple Myeloma Panel: Component     Latest Ref Rng 12/24/2024  IgG (Immunoglobin G), Serum     603 - 1,613 mg/dL 238   IgA     61 - 562 mg/dL 71   IgM (Immunoglobulin M), Srm     15 - 143 mg/dL 7 (L)   Total Protein ELP     6.0 - 8.5 g/dL 5.7 (L) (C)  Albumin  SerPl Elph-Mcnc     2.9 - 4.4 g/dL 3.3 (C)  Alpha 1     0.0 - 0.4 g/dL 0.2 (C)  Alpha2 Glob SerPl Elph-Mcnc     0.4 - 1.0 g/dL 0.7 (C)  B-Globulin SerPl Elph-Mcnc     0.7 - 1.3 g/dL 0.8 (C)  Gamma Glob SerPl Elph-Mcnc     0.4 - 1.8 g/dL 0.8 (C)  M Protein SerPl Elph-Mcnc     Not Observed g/dL Not Observed (C)  Globulin, Total     2.2 - 3.9 g/dL 2.4 (C)  Albumin /Glob SerPl     0.7 - 1.7  1.4 (C)  IFE 1 Comment (C)  Please Note (HCV): Comment (C)    Legend: (L) Low (C) Corrected  Kappa/ Lambda Light Chains:  Component     Latest Ref Rng 12/24/2024  Kappa free light chain     3.3 - 19.4 mg/L 17.8   Lambda free light chains     5.7 - 26.3 mg/L 18.4   Kappa, lambda light chain ratio     0.26 - 1.65  0.97      RADIOGRAPHIC STUDIES: I have personally reviewed the radiological images as listed and agreed with the findings in the report. CT Chest Wo Contrast Result Date: 01/10/2025 EXAM: CT CHEST WITHOUT CONTRAST 01/06/2025 10:48:42 AM TECHNIQUE: CT of the chest was performed without the administration of intravenous contrast. Multiplanar reformatted images are provided for review. Automated exposure control, iterative reconstruction, and/or weight based adjustment of the mA/kV  was utilized to reduce the radiation dose to as low as reasonably achievable. COMPARISON: None available. CLINICAL HISTORY: Abnormal xray - lung nodule, >= 1 cm; Patient with middle lobe opacity on chest x-ray treated as pneumonia. CT chest to evaluate for any underlying lesions. FINDINGS: MEDIASTINUM: Heart and pericardium are unremarkable. The central airways are clear. The central pulmonary arteries are enlarged in keeping with changes of pulmonary arterial hypertension. Stable fusiform dilation of the thoracic aorta measuring 4.1 cm in diameter in its ascending segment and 3.8 cm in diameter in its proximal descending segment. Mild atherosclerotic calcification within the thoracic aorta. LYMPH NODES: No mediastinal, hilar or axillary lymphadenopathy. LUNGS AND PLEURA: Improved, evolving right apical parenchymal scarring. No significant associated solid component. No focal consolidation or pulmonary edema. No pleural effusion or pneumothorax. SOFT TISSUES/BONES: Multiple circumscribed lytic lesions again identified throughout the visualized axial skeleton with the dominant lesion seen within the posterior vertebral body of T6, stable since prior examination in keeping with metastatic disease or myeloma. Multiple healed right rib fractures and remote bilateral medial clavicle fractures again noted. No acute abnormality of the soft tissues. UPPER ABDOMEN: Cholelithiasis without superimposed pericholecystic inflammatory change. No intra or extrahepatic biliary ductal dilation. Surgical changes of the right nephrectomy incidentally noted. Limited images of the upper abdomen demonstrates no acute abnormality. RAF SCORE: Aortic atherosclerosis (icd10-i70.0) Aortic aneurysm (icd10-i71.9) IMPRESSION: 1. Improved, evolving right apical parenchymal scarring without significant associated solid component. 2. Enlarged central pulmonary arteries, consistent with pulmonary arterial hypertension. 3. Stable multiple  circumscribed lytic lesions throughout the visualized axial skeleton, in keeping with metastatic disease or myeloma. 4. Stable fusiform aneurysmal dilation of the thoracic aorta measuring up to 4.1 cm in the ascending segment and 3.8 cm in the proximal descending segment, with mild aortic atherosclerotic calcification. 5. Multiple healed right rib fractures and remote bilateral medial clavicle fractures. 6. Cholelithiasis without pericholecystic inflammatory change or biliary ductal dilation. 7. Postsurgical changes of right nephrectomy. 8. RAF score includes aortic atherosclerosis (ICD10-I70.0) and aortic aneurysm (ICD10-I71.9). Electronically signed by: Dorethia Molt MD 01/10/2025 08:39 PM EST RP Workstation: [id number]    ASSESSMENT & PLAN:  79 y.o. male with  1) IgG kappa multiple myeloma:  -See oncologic history as above.  -Current treatment includes maintenance therapy with daratumumab /Revlimid /Dexamethasone    2) H/O GI bleeding: --Admitted from 06/05/2023-06/06/2023.EGD showed nonbleeding duodenal ulcers. Received 2 units of PRBC. GI recommended PPI twice daily x 10 weeks and then daily thereafter.   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 --Vision has improved after completion of radiation therapy.  --Monitor for now.    5) History of multifocal renal cell carcinoma --Underwent right nephrectomy on 02/03/2016.  He received cryotherapy to his left renal mass in April 2017. --Underwent laparoscopic left partial nephrectomy and simple prostatectomy with Dr. Alvaro on 07/07/2021. 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 Tumor is limited to the kidney (pT1a).  PLAN: - Discussed lab results on 01/22/2025 in detail with patient: - CMP   - Creatinine: 1.68   - Calcium : 8.5 - CBC  - Hemoglobin: 12.0  - WBC: 3.9  - Platelets: 127 - Multiple Myeloma Panel: 12/24/2024  - M protein: not observed  - Discussed details of CT scan in depth with patient  - middle lobe opacity on chest x-ray treated as pneumonia   - enlarged blood vessel in the chest - Follow up with PCP discuss thoracic aortic aneurysm and refer to cardiothoracic surgery if needed - Continue Xgeva  every 4 weeks - Continue daratumumab  Faspro every 4 weeks per integrated scheduling - MD visit in 8 weeks    FOLLOW-UP in 8 weeks for labs and follow-up with Dr. Onesimo.  The total time spent in the appointment was *** minutes* .  All of the patient's questions were answered and the patient knows to call the clinic with any problems, questions, or concerns.  Emaline Onesimo MD MS AAHIVMS [facility] Hematology/Oncology Physician [facility]  *Total Encounter Time as defined by the Centers for Medicare and Medicaid Services includes, in addition to the face-to-face time of a patient visit (documented in the note above) non-face-to-face time: obtaining and reviewing outside history, ordering and reviewing medications, tests or procedures, care coordination (communications with other health care professionals or caregivers) and documentation in the medical record.  I, Marijo Sharps, acting as a neurosurgeon for Emaline Onesimo, MD.,have documented all relevant documentation on the behalf of Emaline Onesimo, MD,as directed by  Emaline Onesimo, MD while in the presence of Emaline Onesimo, MD.  I have reviewed the above documentation for accuracy and completeness, and I agree with the above.  Emaline Onesimo, MD      [1]  Social History Tobacco Use   Smoking status: Never   Smokeless tobacco: Never  Vaping Use   Vaping status: Never Used  Substance Use Topics   Alcohol  use: No   Drug use: No   "

## 2025-01-22 NOTE — Patient Instructions (Signed)
 [facility] - A DEPT OF [facility]  Discharge Instructions: Thank you for choosing [facility] to provide your oncology and hematology care.   If you have a lab appointment with the Cancer Center, please go directly to the Cancer Center and check in at the registration area.   Wear comfortable clothing and clothing appropriate for easy access to any Portacath or PICC line.   We strive to give you quality time with your provider. You may need to reschedule your appointment if you arrive late (15 or more minutes).  Arriving late affects you and other patients whose appointments are after yours.  Also, if you miss three or more appointments without notifying the office, you may be dismissed from the clinic at the provider's discretion.      For prescription refill requests, have your pharmacy contact our office and allow 72 hours for refills to be completed.    Today you received the following chemotherapy and/or immunotherapy agents: daratumumab-hyaluronidase-fihj (DARZALEX FASPRO       To help prevent nausea and vomiting after your treatment, we encourage you to take your nausea medication as directed.  BELOW ARE SYMPTOMS THAT SHOULD BE REPORTED IMMEDIATELY: *FEVER GREATER THAN 100.4 F (38 C) OR HIGHER *CHILLS OR SWEATING *NAUSEA AND VOMITING THAT IS NOT CONTROLLED WITH YOUR NAUSEA MEDICATION *UNUSUAL SHORTNESS OF BREATH *UNUSUAL BRUISING OR BLEEDING *URINARY PROBLEMS (pain or burning when urinating, or frequent urination) *BOWEL PROBLEMS (unusual diarrhea, constipation, pain near the anus) TENDERNESS IN MOUTH AND THROAT WITH OR WITHOUT PRESENCE OF ULCERS (sore throat, sores in mouth, or a toothache) UNUSUAL RASH, SWELLING OR PAIN  UNUSUAL VAGINAL DISCHARGE OR ITCHING   Items with * indicate a potential emergency and should be followed up as soon as possible or go to the Emergency Department if any problems should occur.  Please show the  CHEMOTHERAPY ALERT CARD or IMMUNOTHERAPY ALERT CARD at check-in to the Emergency Department and triage nurse.  Should you have questions after your visit or need to cancel or reschedule your appointment, please contact [facility] - A DEPT OF [facility]  Dept: [phone]  and follow the prompts.  Office hours are 8:00 a.m. to 4:30 p.m. Monday - Friday. Please note that voicemails left after 4:00 p.m. may not be returned until the following business day.  We are closed weekends and major holidays. You have access to a nurse at all times for urgent questions. Please call the main number to the clinic Dept: [phone] and follow the prompts.   For any non-urgent questions, you may also contact your provider using MyChart. We now offer e-Visits for anyone 63 and older to request care online for non-urgent symptoms. For details visit [email or web address].   Also download the MyChart app! Go to the app store, search "MyChart", open the app, select [facility], and log in with your MyChart username and password.

## 2025-01-23 LAB — KAPPA/LAMBDA LIGHT CHAINS
Kappa free light chain: 20.7 mg/L — ABNORMAL HIGH (ref 3.3–19.4)
Kappa, lambda light chain ratio: 1.04 (ref 0.26–1.65)
Lambda free light chains: 19.9 mg/L (ref 5.7–26.3)

## 2025-01-24 ENCOUNTER — Other Ambulatory Visit: Payer: Self-pay | Admitting: Physician Assistant

## 2025-01-24 DIAGNOSIS — Z7189 Other specified counseling: Secondary | ICD-10-CM

## 2025-01-24 DIAGNOSIS — C9002 Multiple myeloma in relapse: Secondary | ICD-10-CM

## 2025-01-24 LAB — MULTIPLE MYELOMA PANEL, SERUM
Albumin SerPl Elph-Mcnc: 3.5 g/dL (ref 2.9–4.4)
Albumin/Glob SerPl: 1.5 (ref 0.7–1.7)
Alpha 1: 0.2 g/dL (ref 0.0–0.4)
Alpha2 Glob SerPl Elph-Mcnc: 0.7 g/dL (ref 0.4–1.0)
B-Globulin SerPl Elph-Mcnc: 0.8 g/dL (ref 0.7–1.3)
Gamma Glob SerPl Elph-Mcnc: 0.7 g/dL (ref 0.4–1.8)
Globulin, Total: 2.4 g/dL (ref 2.2–3.9)
IgA: 79 mg/dL (ref 61–437)
IgG (Immunoglobin G), Serum: 760 mg/dL (ref 603–1613)
IgM (Immunoglobulin M), Srm: 7 mg/dL — ABNORMAL LOW (ref 15–143)
M Protein SerPl Elph-Mcnc: 0.2 g/dL — ABNORMAL HIGH
Total Protein ELP: 5.9 g/dL — ABNORMAL LOW (ref 6.0–8.5)

## 2025-02-19 ENCOUNTER — Inpatient Hospital Stay

## 2025-02-19 ENCOUNTER — Inpatient Hospital Stay: Attending: Hematology

## 2025-03-19 ENCOUNTER — Inpatient Hospital Stay: Attending: Hematology

## 2025-03-19 ENCOUNTER — Inpatient Hospital Stay: Admitting: Physician Assistant

## 2025-03-19 ENCOUNTER — Inpatient Hospital Stay
